# Patient Record
Sex: Male | Born: 1950
Health system: Southern US, Community
[De-identification: ages and names within clinical notes are randomized; demographics above are authoritative.]

## PROBLEM LIST (undated history)

## (undated) DIAGNOSIS — I10 Essential (primary) hypertension: Secondary | ICD-10-CM

## (undated) DIAGNOSIS — I639 Cerebral infarction, unspecified: Secondary | ICD-10-CM

## (undated) DIAGNOSIS — R21 Rash and other nonspecific skin eruption: Secondary | ICD-10-CM

## (undated) DIAGNOSIS — Z8679 Personal history of other diseases of the circulatory system: Secondary | ICD-10-CM

## (undated) DIAGNOSIS — J302 Other seasonal allergic rhinitis: Secondary | ICD-10-CM

## (undated) DIAGNOSIS — Z9109 Other allergy status, other than to drugs and biological substances: Secondary | ICD-10-CM

## (undated) DIAGNOSIS — I429 Cardiomyopathy, unspecified: Secondary | ICD-10-CM

## (undated) DIAGNOSIS — R9431 Abnormal electrocardiogram [ECG] [EKG]: Secondary | ICD-10-CM

## (undated) DIAGNOSIS — S2239XA Fracture of one rib, unspecified side, initial encounter for closed fracture: Secondary | ICD-10-CM

## (undated) DIAGNOSIS — Z91018 Allergy to other foods: Secondary | ICD-10-CM

## (undated) DIAGNOSIS — H9190 Unspecified hearing loss, unspecified ear: Secondary | ICD-10-CM

## (undated) DIAGNOSIS — I77819 Aortic ectasia, unspecified site: Secondary | ICD-10-CM

## (undated) DIAGNOSIS — J329 Chronic sinusitis, unspecified: Secondary | ICD-10-CM

## (undated) DIAGNOSIS — S40212A Abrasion of left shoulder, initial encounter: Secondary | ICD-10-CM

## (undated) DIAGNOSIS — E785 Hyperlipidemia, unspecified: Secondary | ICD-10-CM

## (undated) DIAGNOSIS — S2249XA Multiple fractures of ribs, unspecified side, initial encounter for closed fracture: Secondary | ICD-10-CM

## (undated) HISTORY — DX: Other allergy status, other than to drugs and biological substances: Z91.09

## (undated) HISTORY — DX: Hyperlipidemia, unspecified: E78.5

## (undated) HISTORY — PX: COLONOSCOPY: SHX174

## (undated) HISTORY — DX: Abrasion of left shoulder, initial encounter: S40.212A

## (undated) HISTORY — DX: Personal history of other diseases of the circulatory system: Z86.79

## (undated) HISTORY — DX: Fracture of one rib, unspecified side, initial encounter for closed fracture: S22.39XA

## (undated) HISTORY — DX: Cardiomyopathy, unspecified: I42.9

## (undated) HISTORY — DX: Essential (primary) hypertension: I10

## (undated) HISTORY — DX: Unspecified hearing loss, unspecified ear: H91.90

## (undated) HISTORY — DX: Chronic sinusitis, unspecified: J32.9

## (undated) HISTORY — DX: Cerebral infarction, unspecified: I63.9

## (undated) HISTORY — DX: Aortic ectasia, unspecified site: I77.819

## (undated) HISTORY — DX: Other seasonal allergic rhinitis: J30.2

## (undated) HISTORY — DX: Abnormal electrocardiogram (ECG) (EKG): R94.31

## (undated) HISTORY — DX: Multiple fractures of ribs, unspecified side, initial encounter for closed fracture: S22.49XA

## (undated) HISTORY — PX: OTHER SURGICAL HISTORY: SHX169

## (undated) HISTORY — DX: Rash and other nonspecific skin eruption: R21

## (undated) HISTORY — PX: INGUINAL HERNIA REPAIR: SUR1180

## (undated) HISTORY — DX: Allergy to other foods: Z91.018

---

## 2017-11-16 DIAGNOSIS — I10 Essential (primary) hypertension: Secondary | ICD-10-CM | POA: Diagnosis not present

## 2017-11-16 DIAGNOSIS — Z8673 Personal history of transient ischemic attack (TIA), and cerebral infarction without residual deficits: Secondary | ICD-10-CM | POA: Diagnosis not present

## 2017-11-16 DIAGNOSIS — Z1331 Encounter for screening for depression: Secondary | ICD-10-CM | POA: Diagnosis not present

## 2017-11-16 DIAGNOSIS — R8290 Unspecified abnormal findings in urine: Secondary | ICD-10-CM | POA: Diagnosis not present

## 2017-11-16 DIAGNOSIS — N3000 Acute cystitis without hematuria: Secondary | ICD-10-CM | POA: Diagnosis not present

## 2018-01-31 DIAGNOSIS — H5203 Hypermetropia, bilateral: Secondary | ICD-10-CM | POA: Diagnosis not present

## 2018-02-08 ENCOUNTER — Ambulatory Visit
Admission: RE | Admit: 2018-02-08 | Discharge: 2018-02-08 | Disposition: A | Discharge status: Home / Self Care | Payer: Medicare HMO | Source: Ambulatory Visit | Attending: Internal Medicine | Admitting: Internal Medicine

## 2018-02-08 ENCOUNTER — Other Ambulatory Visit: Payer: Self-pay | Admitting: Internal Medicine

## 2018-02-08 DIAGNOSIS — R21 Rash and other nonspecific skin eruption: Secondary | ICD-10-CM | POA: Diagnosis not present

## 2018-02-08 DIAGNOSIS — S2232XA Fracture of one rib, left side, initial encounter for closed fracture: Secondary | ICD-10-CM | POA: Diagnosis not present

## 2018-02-08 DIAGNOSIS — S2341XA Sprain of ribs, initial encounter: Secondary | ICD-10-CM | POA: Diagnosis not present

## 2018-02-08 DIAGNOSIS — R0781 Pleurodynia: Secondary | ICD-10-CM

## 2018-02-08 DIAGNOSIS — S40212A Abrasion of left shoulder, initial encounter: Secondary | ICD-10-CM | POA: Diagnosis not present

## 2018-02-08 DIAGNOSIS — Z8673 Personal history of transient ischemic attack (TIA), and cerebral infarction without residual deficits: Secondary | ICD-10-CM | POA: Diagnosis not present

## 2018-03-01 DIAGNOSIS — H5310 Unspecified subjective visual disturbances: Secondary | ICD-10-CM | POA: Diagnosis not present

## 2018-03-01 DIAGNOSIS — H5340 Unspecified visual field defects: Secondary | ICD-10-CM | POA: Diagnosis not present

## 2018-03-15 DIAGNOSIS — Z0100 Encounter for examination of eyes and vision without abnormal findings: Secondary | ICD-10-CM | POA: Diagnosis not present

## 2018-05-11 DIAGNOSIS — Z01118 Encounter for examination of ears and hearing with other abnormal findings: Secondary | ICD-10-CM | POA: Diagnosis not present

## 2018-05-11 DIAGNOSIS — Z0001 Encounter for general adult medical examination with abnormal findings: Secondary | ICD-10-CM | POA: Diagnosis not present

## 2018-05-11 DIAGNOSIS — Z0100 Encounter for examination of eyes and vision without abnormal findings: Secondary | ICD-10-CM | POA: Diagnosis not present

## 2018-05-11 DIAGNOSIS — Z1211 Encounter for screening for malignant neoplasm of colon: Secondary | ICD-10-CM | POA: Diagnosis not present

## 2018-05-11 DIAGNOSIS — E785 Hyperlipidemia, unspecified: Secondary | ICD-10-CM | POA: Diagnosis not present

## 2018-05-11 DIAGNOSIS — Z0000 Encounter for general adult medical examination without abnormal findings: Secondary | ICD-10-CM | POA: Diagnosis not present

## 2018-05-11 DIAGNOSIS — J329 Chronic sinusitis, unspecified: Secondary | ICD-10-CM | POA: Diagnosis not present

## 2018-05-11 DIAGNOSIS — H9192 Unspecified hearing loss, left ear: Secondary | ICD-10-CM | POA: Diagnosis not present

## 2018-05-11 DIAGNOSIS — R9431 Abnormal electrocardiogram [ECG] [EKG]: Secondary | ICD-10-CM | POA: Diagnosis not present

## 2018-05-11 DIAGNOSIS — R21 Rash and other nonspecific skin eruption: Secondary | ICD-10-CM

## 2018-05-11 DIAGNOSIS — Z125 Encounter for screening for malignant neoplasm of prostate: Secondary | ICD-10-CM | POA: Diagnosis not present

## 2018-05-24 ENCOUNTER — Telehealth: Payer: Self-pay

## 2018-05-24 NOTE — Telephone Encounter (Signed)
SENT NOTES TO REFERRAL AND FILED NOTES

## 2018-05-29 ENCOUNTER — Ambulatory Visit (INDEPENDENT_AMBULATORY_CARE_PROVIDER_SITE_OTHER): Payer: Medicare HMO | Admitting: Cardiology

## 2018-05-29 DIAGNOSIS — I10 Essential (primary) hypertension: Secondary | ICD-10-CM | POA: Diagnosis not present

## 2018-05-29 DIAGNOSIS — R9431 Abnormal electrocardiogram [ECG] [EKG]: Secondary | ICD-10-CM

## 2018-05-29 HISTORY — DX: Abnormal electrocardiogram (ECG) (EKG): R94.31

## 2018-05-29 HISTORY — DX: Essential (primary) hypertension: I10

## 2018-05-29 NOTE — Progress Notes (Signed)
Cardiology Office Note    Date:  05/29/2018   ID:  Edward [name], DOB Sep 17, 1950, MRN [id number]  PCP:  Shelby Mattocks, PA-C  Cardiologist:  Fransico Him, MD   Chief Complaint  Patient presents with  . New Patient (Initial Visit)    abnormal EKG    History of Present Illness:  Edward [name] is a 67 y.o. male who is being seen today for the evaluation of abnormal EKG at the request of Rodriguez-Southworth, S*.  This is a 67 year old male with a history of varicose veins, hypertension, seasonal allergies who was referred for evaluation of abnormal EKG.  He recently presented for routine physical exam to his PCP and was found to have sinus bradycardia 58 bpm with left anterior fascicular block and nonspecific T wave and normality.  He is now referred for further evaluation.  He tells me that in 2016 he had taken a long plane ride to [facility] and then flew back and afterwards suffered an acute CVA.  He said a work-up at that time with echo showed an enlarged left side of the heart but nothing further was ever done about it.  EKG in review showed T wave inversions in V4 through V6 with flattening of the T wave in V3 and inverted T waves in 3 and aVF.  He denies any chest pain or pressure, SOB, DOE, PND, orthopnea, LE edema, dizziness, palpitations or syncope. He is compliant with his meds and is tolerating meds with no SE. he does have a family history of vascular disease with his mom having multiple CVAs in his father having CHF in the past.    Past Medical History:  Diagnosis Date  . Abnormal EKG   . Abnormal EKG 05/29/2018   Sinus bradycardia and LAFB with TWI V4-V6, III and aVF  . Abrasion of shoulder, left    posterior shoulder  . Benign essential HTN 05/29/2018  . Environmental allergies   . Fracture, ribs   . H/O: varicose veins   . Hearing loss   . Hypertension   . Multiple food allergies   . Rash   . Seasonal allergies   . Sinusitis     No past surgical  history on file.  Current Medications: Current Meds  Medication Sig  . pseudoephedrine (SUDAFED) 30 MG tablet Take 30 mg by mouth every 4 (four) hours as needed for congestion.    Allergies:   Patient has no known allergies.   Social History   Socioeconomic History  . Marital status: Married    Spouse name: Not on file  . Number of children: Not on file  . Years of education: Not on file  . Highest education level: Not on file  Occupational History  . Not on file  Social Needs  . Financial resource strain: Not on file  . Food insecurity:    Worry: Not on file    Inability: Not on file  . Transportation needs:    Medical: Not on file    Non-medical: Not on file  Tobacco Use  . Smoking status: Never Smoker  . Smokeless tobacco: Never Used  Substance and Sexual Activity  . Alcohol use: Not on file  . Drug use: Not on file  . Sexual activity: Not on file  Lifestyle  . Physical activity:    Days per week: Not on file    Minutes per session: Not on file  . Stress: Not on file  Relationships  . Social connections:  Talks on phone: Not on file    Gets together: Not on file    Attends religious service: Not on file    Active member of club or organization: Not on file    Attends meetings of clubs or organizations: Not on file    Relationship status: Not on file  Other Topics Concern  . Not on file  Social History Narrative  . Not on file     Family History:  The patient's family history includes CVA in his mother; Heart failure in his father.   ROS:   Please see the history of present illness.    ROS All other systems reviewed and are negative.  No flowsheet data found.     PHYSICAL EXAM:   VS:  BP (!) 160/98   Pulse 90   Ht 6\' 1"  (1.854 m)   Wt 201 lb 6.4 oz (91.4 kg)   SpO2 92%   BMI 26.57 kg/m    GEN: Well nourished, well developed, in no acute distress  HEENT: normal  Neck: no JVD, carotid bruits, or masses Cardiac: RRR; no murmurs, rubs, or  gallops,no edema.  Intact distal pulses bilaterally.  Respiratory:  clear to auscultation bilaterally, normal work of breathing GI: soft, nontender, nondistended, + BS MS: no deformity or atrophy  Skin: warm and dry, no rash Neuro:  Alert and Oriented x 3, Strength and sensation are intact Psych: euthymic mood, full affect  Wt Readings from Last 3 Encounters:  05/29/18 201 lb 6.4 oz (91.4 kg)      Studies/Labs Reviewed:   EKG:  EKG isnot ordered today.   Recent Labs: No results found for requested labs within last 8760 hours.   Lipid Panel No results found for: CHOL, TRIG, HDL, CHOLHDL, VLDL, LDLCALC, LDLDIRECT  Additional studies/ records that were reviewed today include:  Office notes from PCP    ASSESSMENT:    1. Abnormal EKG   2. Benign essential HTN      PLAN:  In order of problems listed above:  1.  Abnormal EKG -EKG and PCP office showed T wave inversions in V4 through V6 with T wave flattening in V3.  There was T wave inversions in leads III and aVF.  He also had a left anterior fascicular block.  He is completely asymptomatic from a cardiac standpoint and his only risk factors are age greater than 73, male sex and hypertension.  I recommended checking a 2D echocardiogram to assess LV function.  I will also get a nuclear stress test to rule out inducible ischemia.  2.  Hypertension - BP is well controlled on exam today.  He is currently on no antihypertensive medications.    Medication Adjustments/Labs and Tests Ordered: Current medicines are reviewed at length with the patient today.  Concerns regarding medicines are outlined above.  Medication changes, Labs and Tests ordered today are listed in the Patient Instructions below.  There are no Patient Instructions on file for this visit.   Signed, Fransico Him, MD  05/29/2018 8:48 AM    [address], [place], [place]  [address] Phone: [phone]; Fax: [phone]

## 2018-05-29 NOTE — Patient Instructions (Addendum)
Medication Instructions:  .Your physician recommends that you continue on your current medications as directed. Please refer to the Current Medication list given to you today.  Testing/Procedures: Your physician has requested that you have en exercise stress myoview. For further information please visit [email or web address]. Please follow instruction sheet, as given.  Your physician has requested that you have an echocardiogram. Echocardiography is a painless test that uses sound waves to create images of your heart. It provides your doctor with information about the size and shape of your heart and how well your heart's chambers and valves are working. This procedure takes approximately one hour. There are no restrictions for this procedure.  Schedule a Calcium Score  Follow-Up: As needed  If you need a refill on your cardiac medications before your next appointment, please call your pharmacy.

## 2018-06-05 DIAGNOSIS — L570 Actinic keratosis: Secondary | ICD-10-CM | POA: Diagnosis not present

## 2018-06-05 DIAGNOSIS — L2084 Intrinsic (allergic) eczema: Secondary | ICD-10-CM | POA: Diagnosis not present

## 2018-06-06 ENCOUNTER — Other Ambulatory Visit (HOSPITAL_COMMUNITY): Payer: Medicare HMO

## 2018-06-06 ENCOUNTER — Inpatient Hospital Stay: Admission: RE | Admit: 2018-06-06 | Payer: Medicare HMO | Source: Ambulatory Visit

## 2018-06-06 ENCOUNTER — Encounter (HOSPITAL_COMMUNITY): Payer: Medicare HMO

## 2018-06-08 ENCOUNTER — Telehealth (HOSPITAL_COMMUNITY): Payer: Self-pay | Admitting: *Deleted

## 2018-06-08 NOTE — Telephone Encounter (Signed)
Patient given detailed instructions per Myocardial Perfusion Study Information Sheet for the test on 06/13/18. Patient notified to arrive 15 minutes early and that it is imperative to arrive on time for appointment to keep from having the test rescheduled.  If you need to cancel or reschedule your appointment, please call the office within 24 hours of your appointment. . Patient verbalized understanding. Edward [name]     

## 2018-06-13 ENCOUNTER — Other Ambulatory Visit (HOSPITAL_COMMUNITY): Payer: Medicare HMO

## 2018-06-13 ENCOUNTER — Encounter (HOSPITAL_COMMUNITY): Payer: Medicare HMO

## 2018-06-13 ENCOUNTER — Other Ambulatory Visit: Payer: Medicare HMO

## 2018-06-27 ENCOUNTER — Encounter: Payer: Self-pay | Admitting: Cardiology

## 2018-07-05 ENCOUNTER — Telehealth: Payer: Self-pay

## 2018-07-05 DIAGNOSIS — R9431 Abnormal electrocardiogram [ECG] [EKG]: Secondary | ICD-10-CM

## 2018-07-05 NOTE — Telephone Encounter (Signed)
New message    Patient was scheduled on Oct 15th for CT, ECHO, & Nuclear canceled due inactive insurance patient is calling back to R/s appt to Dec 3rd. Will need an new epic order put in.

## 2018-07-18 NOTE — Telephone Encounter (Signed)
Spoke with the patient, he understands his instructions and is willing to schedule the tests.

## 2018-08-08 ENCOUNTER — Encounter (HOSPITAL_COMMUNITY): Payer: Self-pay | Admitting: *Deleted

## 2018-08-08 ENCOUNTER — Telehealth (HOSPITAL_COMMUNITY): Payer: Self-pay | Admitting: *Deleted

## 2018-08-08 NOTE — Telephone Encounter (Signed)
Patient given detailed instructions per Myocardial Perfusion Study Information Sheet for the test on 08/14/18 at 1045. Patient notified to arrive 15 minutes early and that it is imperative to arrive on time for appointment to keep from having the test rescheduled.  If you need to cancel or reschedule your appointment, please call the office within 24 hours of your appointment. . Patient verbalized understanding.Keefer Soulliere, Ranae Palms

## 2018-08-14 ENCOUNTER — Ambulatory Visit (HOSPITAL_COMMUNITY): Payer: Medicare HMO | Attending: Cardiology

## 2018-08-14 ENCOUNTER — Other Ambulatory Visit: Payer: Self-pay

## 2018-08-14 ENCOUNTER — Ambulatory Visit (HOSPITAL_BASED_OUTPATIENT_CLINIC_OR_DEPARTMENT_OTHER): Payer: Medicare HMO

## 2018-08-14 ENCOUNTER — Ambulatory Visit: Admission: RE | Admit: 2018-08-14 | Payer: Self-pay | Source: Ambulatory Visit

## 2018-08-14 DIAGNOSIS — R9431 Abnormal electrocardiogram [ECG] [EKG]: Secondary | ICD-10-CM | POA: Insufficient documentation

## 2018-08-14 LAB — MYOCARDIAL PERFUSION IMAGING
CHL CUP NUCLEAR SSS: 0
CHL CUP REST BP STRESS: 157 mmHg
CHL CUP RESTING HR STRESS: 62 {beats}/min
CSEPPHR: 91 {beats}/min
LV sys vol: 88 mL
LVDIAVOL: 150 mL (ref 62–150)
SDS: 0
SRS: 0
TID: 1.04

## 2018-08-14 MED ORDER — TECHNETIUM TC 99M TETROFOSMIN IV KIT
9.4000 | PACK | Freq: Once | INTRAVENOUS | Status: AC | PRN
  Administered 2018-08-14: 9.4 via INTRAVENOUS
  Filled 2018-08-14: qty 10

## 2018-08-14 MED ORDER — TECHNETIUM TC 99M TETROFOSMIN IV KIT
32.6000 | PACK | Freq: Once | INTRAVENOUS | Status: AC | PRN
  Administered 2018-08-14: 32.6 via INTRAVENOUS
  Filled 2018-08-14: qty 33

## 2018-08-14 MED ORDER — REGADENOSON 0.4 MG/5ML IV SOLN
0.4000 mg | Freq: Once | INTRAVENOUS | Status: AC
  Administered 2018-08-14: 0.4 mg via INTRAVENOUS

## 2018-08-17 ENCOUNTER — Telehealth: Payer: Self-pay

## 2018-08-17 DIAGNOSIS — I77810 Thoracic aortic ectasia: Secondary | ICD-10-CM

## 2018-08-17 NOTE — Telephone Encounter (Signed)
Spoke with the patient he expressed understanding about the results and accepted having a repeat echo in 1 year. He had no further questions.

## 2018-08-17 NOTE — Telephone Encounter (Signed)
-----   Message from Sueanne Margarita, MD sent at 08/14/2018  7:51 PM EST ----- Echo showed mildly thickened heart muscle with low normal LVF and increased stiffness of heart muscle.  Mildly dilated ascending aorta.  Please repeat echo in 1 year for dilated aorta

## 2018-09-21 ENCOUNTER — Ambulatory Visit (INDEPENDENT_AMBULATORY_CARE_PROVIDER_SITE_OTHER): Payer: Medicare HMO | Admitting: Internal Medicine

## 2018-09-21 ENCOUNTER — Encounter: Payer: Self-pay | Admitting: Internal Medicine

## 2018-09-21 ENCOUNTER — Other Ambulatory Visit: Payer: Self-pay

## 2018-09-21 VITALS — BP 130/88 | HR 75 | Temp 98.3°F | Resp 16 | Ht 72.0 in | Wt 200.0 lb

## 2018-09-21 DIAGNOSIS — R05 Cough: Secondary | ICD-10-CM

## 2018-09-21 DIAGNOSIS — R062 Wheezing: Secondary | ICD-10-CM

## 2018-09-21 DIAGNOSIS — R059 Cough, unspecified: Secondary | ICD-10-CM

## 2018-09-21 DIAGNOSIS — R6883 Chills (without fever): Secondary | ICD-10-CM | POA: Diagnosis not present

## 2018-09-21 DIAGNOSIS — J40 Bronchitis, not specified as acute or chronic: Secondary | ICD-10-CM | POA: Diagnosis not present

## 2018-09-21 LAB — POCT INFLUENZA A/B
INFLUENZA A, POC: NEGATIVE
Influenza B, POC: NEGATIVE

## 2018-09-21 MED ORDER — DOXYCYCLINE HYCLATE 100 MG PO TABS: 100.0000 mg | ORAL_TABLET | Freq: Two times a day (BID) | ORAL | 0 refills | Status: DC

## 2018-09-21 MED ORDER — IPRATROPIUM-ALBUTEROL 0.5-2.5 (3) MG/3ML IN SOLN
3.0000 mL | Freq: Once | RESPIRATORY_TRACT | Status: AC
  Administered 2018-09-21: 3 mL via RESPIRATORY_TRACT

## 2018-09-21 MED ORDER — ALBUTEROL SULFATE HFA 108 (90 BASE) MCG/ACT IN AERS: 2.0000 | INHALATION_SPRAY | Freq: Four times a day (QID) | RESPIRATORY_TRACT | 0 refills | Status: DC | PRN

## 2018-09-21 NOTE — Patient Instructions (Addendum)
     If you have lab work done today you will be contacted with your lab results within the next 2 weeks.  If you have not heard from us then please contact us. The fastest way to get your results is to register for My Chart.   IF you received an x-ray today, you will receive an invoice from [facility]. Please contact [facility] at [phone] with questions or concerns regarding your invoice.   IF you received labwork today, you will receive an invoice from [facility]. Please contact [facility] at [phone] with questions or concerns regarding your invoice.   Our billing staff will not be able to assist you with questions regarding bills from these companies.  You will be contacted with the lab results as soon as they are available. The fastest way to get your results is to activate your My Chart account. Instructions are located on the last page of this paperwork. If you have not heard from us regarding the results in 2 weeks, please contact this office.     Acute Bronchitis, Adult Acute bronchitis is when air tubes (bronchi) in the lungs suddenly get swollen. The condition can make it hard to breathe. It can also cause these symptoms:  A cough.  Coughing up clear, yellow, or green mucus.  Wheezing.  Chest congestion.  Shortness of breath.  A fever.  Body aches.  Chills.  A sore throat. Follow these instructions at home:  Medicines  Take over-the-counter and prescription medicines only as told by your doctor.  If you were prescribed an antibiotic medicine, take it as told by your doctor. Do not stop taking the antibiotic even if you start to feel better. General instructions  Rest.  Drink enough fluids to keep your pee (urine) pale yellow.  Avoid smoking and secondhand smoke. If you smoke and you need help quitting, ask your doctor. Quitting will help your lungs heal faster.  Use an inhaler, cool mist vaporizer, or humidifier as told by your  doctor.  Keep all follow-up visits as told by your doctor. This is important. How is this prevented? To lower your risk of getting this condition again:  Wash your hands often with soap and water. If you cannot use soap and water, use hand sanitizer.  Avoid contact with people who have cold symptoms.  Try not to touch your hands to your mouth, nose, or eyes.  Make sure to get the flu shot every year. Contact a doctor if:  Your symptoms do not get better in 2 weeks. Get help right away if:  You cough up blood.  You have chest pain.  You have very bad shortness of breath.  You become dehydrated.  You faint (pass out) or keep feeling like you are going to pass out.  You keep throwing up (vomiting).  You have a very bad headache.  Your fever or chills gets worse. This information is not intended to replace advice given to you by your health care provider. Make sure you discuss any questions you have with your health care provider. Document Released: 02/02/2008 Document Revised: 03/30/2017 Document Reviewed: 02/04/2016 Elsevier Interactive Patient Education  2019 [employer] Inc[employer]  

## 2018-09-21 NOTE — Progress Notes (Addendum)
Subjective:     Patient ID: Edward [name] , male    DOB: June 05, 1951 , 68 y.o.   MRN: [id number]   Chief Complaint  Patient presents with  . Cough    chronic   . Nasal Congestion    with some drainage and chills x 3 days     HPI Pt is here due to developing  a  Wheezy cough 11/2 weeks ago and at times felt little feverish. Then 2 days ago, got nose stuffiness. Rhinitis is clear to green mucous with the L side bleeding. Had a few days of feeling aching on his L upper teeth. Chills has been having off and on since the cough. He been taking vix Nyquil type med. One tablet a day. Has not done saline nose rinses. Denies HA or body aches. Denies past history of sinus surgery, but is prone to get sinus infections. Has had wheezing in the past related to allergic and reactive airway disease.   Past Medical History:  Diagnosis Date  . Abnormal EKG 05/29/2018   Sinus bradycardia and LAFB with TWI V4-V6, III and aVF  . Abrasion of shoulder, left    posterior shoulder  . Benign essential HTN 05/29/2018  . Environmental allergies   . Fracture, ribs   . H/O: varicose veins   . Hearing loss   . Hypertension   . Multiple food allergies   . Rash   . Seasonal allergies   . Sinusitis      Family History  Problem Relation Age of Onset  . CVA Mother   . Heart failure Father      Current Outpatient Medications:  .  pseudoephedrine (SUDAFED) 30 MG tablet, Take 30 mg by mouth every 4 (four) hours as needed for congestion., Disp: , Rfl:    No Known Allergies   Review of Systems  Constitutional: Positive for chills. Negative for diaphoresis and fever.  HENT: Positive for congestion and postnasal drip. Negative for facial swelling, sinus pressure, sinus pain and sore throat.   Respiratory: Positive for cough and wheezing. Negative for shortness of breath.   Musculoskeletal: Negative for myalgias.  Neurological: Negative for headaches.     Today's Vitals   09/21/18 1124  BP: 140/86  Pulse: 75   Resp: 16  Temp: 98.3 F (36.8 C)  TempSrc: Oral  SpO2: 96%  Weight: 200 lb (90.7 kg)  Height: 6' (1.829 m)   Body mass index is 27.12 kg/m.  Repeated BP- 130/88, post neb pulse ox 98% Objective:  Physical Exam Vitals signs and nursing note reviewed.  Constitutional:      General: He is not in acute distress.    Appearance: Normal appearance. He is normal weight. He is not toxic-appearing.  HENT:     Head: Normocephalic.     Right Ear: Tympanic membrane and ear canal normal.     Left Ear: Tympanic membrane and ear canal normal.     Nose: Congestion present. No rhinorrhea.     Comments: Has mild blood on the R septum, has moderate congestion on L side. No discolored mucous noted.     Mouth/Throat:     Mouth: Mucous membranes are moist.     Pharynx: Oropharynx is clear.  Eyes:     General: No scleral icterus.    Conjunctiva/sclera: Conjunctivae normal.  Neck:     Musculoskeletal: Neck supple. No neck rigidity.  Cardiovascular:     Rate and Rhythm: Regular rhythm.     Heart sounds: No  murmur.  Pulmonary:     Effort: Pulmonary effort is normal.     Breath sounds: Wheezing present. No rhonchi or rales.  Lymphadenopathy:     Cervical: No cervical adenopathy.  Skin:    General: Skin is warm and dry.  Neurological:     Mental Status: He is alert and oriented to person, place, and time.  Psychiatric:        Mood and Affect: Mood normal.        Behavior: Behavior normal.        Thought Content: Thought content normal.        Judgment: Judgment normal.    Flu A&B- negative Assessment And Plan:   Bronchitis- acute. He was given Neb treatment which helped with cough attacks.  Was placed on Doxycycline and Proventil inhaler as noted.  If he gets worse, was advised to be seen again.    Early Steel RODRIGUEZ-SOUTHWORTH, PA-C

## 2018-09-25 ENCOUNTER — Telehealth: Payer: Self-pay

## 2018-09-25 ENCOUNTER — Other Ambulatory Visit: Payer: Self-pay | Admitting: Nurse Practitioner

## 2018-09-25 ENCOUNTER — Other Ambulatory Visit: Payer: Self-pay

## 2018-09-25 DIAGNOSIS — Z20828 Contact with and (suspected) exposure to other viral communicable diseases: Secondary | ICD-10-CM

## 2018-09-25 MED ORDER — OSELTAMIVIR PHOSPHATE 75 MG PO CAPS: 75.0000 mg | ORAL_CAPSULE | Freq: Every day | ORAL | 0 refills | Status: AC

## 2018-09-25 MED ORDER — BALOXAVIR MARBOXIL(40 MG DOSE) 2 X 20 MG PO TBPK: 2.0000 | ORAL_TABLET | Freq: Once | ORAL | 0 refills | Status: AC

## 2018-10-16 ENCOUNTER — Other Ambulatory Visit: Payer: Self-pay | Admitting: Internal Medicine

## 2018-10-16 DIAGNOSIS — J40 Bronchitis, not specified as acute or chronic: Secondary | ICD-10-CM

## 2019-03-23 DIAGNOSIS — H2513 Age-related nuclear cataract, bilateral: Secondary | ICD-10-CM | POA: Diagnosis not present

## 2019-03-23 DIAGNOSIS — H5203 Hypermetropia, bilateral: Secondary | ICD-10-CM | POA: Diagnosis not present

## 2019-03-23 DIAGNOSIS — H52223 Regular astigmatism, bilateral: Secondary | ICD-10-CM | POA: Diagnosis not present

## 2019-03-23 DIAGNOSIS — H524 Presbyopia: Secondary | ICD-10-CM | POA: Diagnosis not present

## 2019-04-23 DIAGNOSIS — L304 Erythema intertrigo: Secondary | ICD-10-CM | POA: Diagnosis not present

## 2019-04-23 DIAGNOSIS — D1801 Hemangioma of skin and subcutaneous tissue: Secondary | ICD-10-CM | POA: Diagnosis not present

## 2019-04-23 DIAGNOSIS — D225 Melanocytic nevi of trunk: Secondary | ICD-10-CM | POA: Diagnosis not present

## 2019-04-23 DIAGNOSIS — L821 Other seborrheic keratosis: Secondary | ICD-10-CM | POA: Diagnosis not present

## 2019-04-23 DIAGNOSIS — L578 Other skin changes due to chronic exposure to nonionizing radiation: Secondary | ICD-10-CM | POA: Diagnosis not present

## 2019-04-23 DIAGNOSIS — L570 Actinic keratosis: Secondary | ICD-10-CM | POA: Diagnosis not present

## 2019-04-23 DIAGNOSIS — L814 Other melanin hyperpigmentation: Secondary | ICD-10-CM | POA: Diagnosis not present

## 2019-05-24 ENCOUNTER — Ambulatory Visit: Payer: Medicare HMO

## 2019-05-24 ENCOUNTER — Ambulatory Visit (INDEPENDENT_AMBULATORY_CARE_PROVIDER_SITE_OTHER): Payer: Medicare HMO

## 2019-05-24 ENCOUNTER — Encounter: Payer: Self-pay | Admitting: Internal Medicine

## 2019-05-24 ENCOUNTER — Other Ambulatory Visit: Payer: Self-pay

## 2019-05-24 ENCOUNTER — Ambulatory Visit (INDEPENDENT_AMBULATORY_CARE_PROVIDER_SITE_OTHER): Payer: Medicare HMO | Admitting: Internal Medicine

## 2019-05-24 VITALS — BP 142/82 | HR 84 | Temp 98.6°F | Ht 73.0 in | Wt 196.4 lb

## 2019-05-24 VITALS — BP 142/82 | HR 84 | Temp 98.6°F | Ht 73.0 in | Wt 196.0 lb

## 2019-05-24 DIAGNOSIS — R69 Illness, unspecified: Secondary | ICD-10-CM | POA: Diagnosis not present

## 2019-05-24 DIAGNOSIS — I10 Essential (primary) hypertension: Secondary | ICD-10-CM

## 2019-05-24 DIAGNOSIS — G250 Essential tremor: Secondary | ICD-10-CM

## 2019-05-24 DIAGNOSIS — Z1159 Encounter for screening for other viral diseases: Secondary | ICD-10-CM

## 2019-05-24 DIAGNOSIS — Z23 Encounter for immunization: Secondary | ICD-10-CM

## 2019-05-24 DIAGNOSIS — R82998 Other abnormal findings in urine: Secondary | ICD-10-CM

## 2019-05-24 DIAGNOSIS — R9431 Abnormal electrocardiogram [ECG] [EKG]: Secondary | ICD-10-CM | POA: Diagnosis not present

## 2019-05-24 DIAGNOSIS — Z0100 Encounter for examination of eyes and vision without abnormal findings: Secondary | ICD-10-CM | POA: Diagnosis not present

## 2019-05-24 DIAGNOSIS — Z0000 Encounter for general adult medical examination without abnormal findings: Secondary | ICD-10-CM

## 2019-05-24 DIAGNOSIS — I7121 Aneurysm of the ascending aorta, without rupture: Secondary | ICD-10-CM | POA: Insufficient documentation

## 2019-05-24 DIAGNOSIS — Z0001 Encounter for general adult medical examination with abnormal findings: Secondary | ICD-10-CM

## 2019-05-24 DIAGNOSIS — R3989 Other symptoms and signs involving the genitourinary system: Secondary | ICD-10-CM | POA: Diagnosis not present

## 2019-05-24 DIAGNOSIS — Z820 Family history of epilepsy and other diseases of the nervous system: Secondary | ICD-10-CM

## 2019-05-24 DIAGNOSIS — E7800 Pure hypercholesterolemia, unspecified: Secondary | ICD-10-CM

## 2019-05-24 DIAGNOSIS — Z1211 Encounter for screening for malignant neoplasm of colon: Secondary | ICD-10-CM | POA: Diagnosis not present

## 2019-05-24 DIAGNOSIS — R030 Elevated blood-pressure reading, without diagnosis of hypertension: Secondary | ICD-10-CM | POA: Diagnosis not present

## 2019-05-24 DIAGNOSIS — Z87898 Personal history of other specified conditions: Secondary | ICD-10-CM | POA: Diagnosis not present

## 2019-05-24 DIAGNOSIS — I77810 Thoracic aortic ectasia: Secondary | ICD-10-CM | POA: Diagnosis not present

## 2019-05-24 LAB — POCT URINALYSIS DIPSTICK
Bilirubin, UA: NEGATIVE
Blood, UA: NEGATIVE
Glucose, UA: NEGATIVE
Ketones, UA: NEGATIVE
Nitrite, UA: NEGATIVE
Protein, UA: NEGATIVE
Spec Grav, UA: 1.01 (ref 1.010–1.025)
Urobilinogen, UA: 0.2 E.U./dL
pH, UA: 6 (ref 5.0–8.0)

## 2019-05-24 MED ORDER — PREVNAR 13 IM SUSP: 0.5000 mL | INTRAMUSCULAR | 0 refills | Status: AC

## 2019-05-24 NOTE — Patient Instructions (Signed)
[name] , Thank you for taking time to come for your Medicare Wellness Visit. I appreciate your ongoing commitment to your health goals. Please review the following plan we discussed and let me know if I can assist you in the future.   Screening recommendations/referrals: Colonoscopy: up to date per patient Recommended yearly ophthalmology/optometry visit for glaucoma screening and checkup Recommended yearly dental visit for hygiene and checkup  Vaccinations: Influenza vaccine: declines at this time Pneumococcal vaccine: sent to pharmacy Tdap vaccine: states believes had Shingles vaccine: discussed    Advanced directives: Advance directive discussed with you today. I have provided a copy for you to complete at home and have notarized. Once this is complete please bring a copy in to our office so we can scan it into your chart.   Conditions/risks identified: overweight  Next appointment: 05/29/2020 at 10:00  Preventive Care 24 Years and Older, Male Preventive care refers to lifestyle choices and visits with your health care provider that can promote health and wellness. What does preventive care include?  A yearly physical exam. This is also called an annual well check.  Dental exams once or twice a year.  Routine eye exams. Ask your health care provider how often you should have your eyes checked.  Personal lifestyle choices, including:  Daily care of your teeth and gums.  Regular physical activity.  Eating a healthy diet.  Avoiding tobacco and drug use.  Limiting alcohol use.  Practicing safe sex.  Taking low doses of aspirin every day.  Taking vitamin and mineral supplements as recommended by your health care provider. What happens during an annual well check? The services and screenings done by your health care provider during your annual well check will depend on your age, overall health, lifestyle risk factors, and family history of disease. Counseling  Your  health care provider may ask you questions about your:  Alcohol use.  Tobacco use.  Drug use.  Emotional well-being.  Home and relationship well-being.  Sexual activity.  Eating habits.  History of falls.  Memory and ability to understand (cognition).  Work and work Statistician. Screening  You may have the following tests or measurements:  Height, weight, and BMI.  Blood pressure.  Lipid and cholesterol levels. These may be checked every 5 years, or more frequently if you are over 45 years old.  Skin check.  Lung cancer screening. You may have this screening every year starting at age 26 if you have a 30-pack-year history of smoking and currently smoke or have quit within the past 15 years.  Fecal occult blood test (FOBT) of the stool. You may have this test every year starting at age 65.  Flexible sigmoidoscopy or colonoscopy. You may have a sigmoidoscopy every 5 years or a colonoscopy every 10 years starting at age 64.  Prostate cancer screening. Recommendations will vary depending on your family history and other risks.  Hepatitis C blood test.  Hepatitis B blood test.  Sexually transmitted disease (STD) testing.  Diabetes screening. This is done by checking your blood sugar (glucose) after you have not eaten for a while (fasting). You may have this done every 1-3 years.  Abdominal aortic aneurysm (AAA) screening. You may need this if you are a current or former smoker.  Osteoporosis. You may be screened starting at age 38 if you are at high risk. Talk with your health care provider about your test results, treatment options, and if necessary, the need for more tests. Vaccines  Your  health care provider may recommend certain vaccines, such as:  Influenza vaccine. This is recommended every year.  Tetanus, diphtheria, and acellular pertussis (Tdap, Td) vaccine. You may need a Td booster every 10 years.  Zoster vaccine. You may need this after age 45.   Pneumococcal 13-valent conjugate (PCV13) vaccine. One dose is recommended after age 42.  Pneumococcal polysaccharide (PPSV23) vaccine. One dose is recommended after age 89. Talk to your health care provider about which screenings and vaccines you need and how often you need them. This information is not intended to replace advice given to you by your health care provider. Make sure you discuss any questions you have with your health care provider. Document Released: 09/12/2015 Document Revised: 05/05/2016 Document Reviewed: 06/17/2015 Elsevier Interactive Patient Education  2017 [place] Prevention in the Home Falls can cause injuries. They can happen to people of all ages. There are many things you can do to make your home safe and to help prevent falls. What can I do on the outside of my home?  Regularly fix the edges of walkways and driveways and fix any cracks.  Remove anything that might make you trip as you walk through a door, such as a raised step or threshold.  Trim any bushes or trees on the path to your home.  Use bright outdoor lighting.  Clear any walking paths of anything that might make someone trip, such as rocks or tools.  Regularly check to see if handrails are loose or broken. Make sure that both sides of any steps have handrails.  Any raised decks and porches should have guardrails on the edges.  Have any leaves, snow, or ice cleared regularly.  Use sand or salt on walking paths during winter.  Clean up any spills in your garage right away. This includes oil or grease spills. What can I do in the bathroom?  Use night lights.  Install grab bars by the toilet and in the tub and shower. Do not use towel bars as grab bars.  Use non-skid mats or decals in the tub or shower.  If you need to sit down in the shower, use a plastic, non-slip stool.  Keep the floor dry. Clean up any water that spills on the floor as soon as it happens.  Remove soap  buildup in the tub or shower regularly.  Attach bath mats securely with double-sided non-slip rug tape.  Do not have throw rugs and other things on the floor that can make you trip. What can I do in the bedroom?  Use night lights.  Make sure that you have a light by your bed that is easy to reach.  Do not use any sheets or blankets that are too big for your bed. They should not hang down onto the floor.  Have a firm chair that has side arms. You can use this for support while you get dressed.  Do not have throw rugs and other things on the floor that can make you trip. What can I do in the kitchen?  Clean up any spills right away.  Avoid walking on wet floors.  Keep items that you use a lot in easy-to-reach places.  If you need to reach something above you, use a strong step stool that has a grab bar.  Keep electrical cords out of the way.  Do not use floor polish or wax that makes floors slippery. If you must use wax, use non-skid floor wax.  Do not  have throw rugs and other things on the floor that can make you trip. What can I do with my stairs?  Do not leave any items on the stairs.  Make sure that there are handrails on both sides of the stairs and use them. Fix handrails that are broken or loose. Make sure that handrails are as long as the stairways.  Check any carpeting to make sure that it is firmly attached to the stairs. Fix any carpet that is loose or worn.  Avoid having throw rugs at the top or bottom of the stairs. If you do have throw rugs, attach them to the floor with carpet tape.  Make sure that you have a light switch at the top of the stairs and the bottom of the stairs. If you do not have them, ask someone to add them for you. What else can I do to help prevent falls?  Wear shoes that:  Do not have high heels.  Have rubber bottoms.  Are comfortable and fit you well.  Are closed at the toe. Do not wear sandals.  If you use a stepladder:  Make  sure that it is fully opened. Do not climb a closed stepladder.  Make sure that both sides of the stepladder are locked into place.  Ask someone to hold it for you, if possible.  Clearly mark and make sure that you can see:  Any grab bars or handrails.  First and last steps.  Where the edge of each step is.  Use tools that help you move around (mobility aids) if they are needed. These include:  Canes.  Walkers.  Scooters.  Crutches.  Turn on the lights when you go into a dark area. Replace any light bulbs as soon as they burn out.  Set up your furniture so you have a clear path. Avoid moving your furniture around.  If any of your floors are uneven, fix them.  If there are any pets around you, be aware of where they are.  Review your medicines with your doctor. Some medicines can make you feel dizzy. This can increase your chance of falling. Ask your doctor what other things that you can do to help prevent falls. This information is not intended to replace advice given to you by your health care provider. Make sure you discuss any questions you have with your health care provider. Document Released: 06/12/2009 Document Revised: 01/22/2016 Document Reviewed: 09/20/2014 Elsevier Interactive Patient Education  2017 [employer].

## 2019-05-24 NOTE — Addendum Note (Signed)
Addended by: Nicki Guadalajara on: 05/24/2019 02:05 PM   Modules accepted: Orders

## 2019-05-24 NOTE — Progress Notes (Signed)
Subjective:   Edward [name] is a 68 y.o. male who presents for an Initial Medicare Annual Wellness Visit.  Review of Systems  n/a Cardiac Risk Factors include: advanced age (>79men, >42 women);hypertension    Objective:    Today's Vitals   05/24/19 1153  BP: (!) 142/82  Pulse: 84  Temp: 98.6 F (37 C)  TempSrc: Oral  Weight: 196 lb (88.9 kg)  Height: 6\' 1"  (1.854 m)   Body mass index is 25.86 kg/m.  Advanced Directives 05/24/2019  Does Patient Have a Medical Advance Directive? No  Would patient like information on creating a medical advance directive? Yes (MAU/Ambulatory/Procedural Areas - Information given)    Current Medications (verified) No outpatient encounter medications on file as of 05/24/2019.   No facility-administered encounter medications on file as of 05/24/2019.     Allergies (verified) Patient has no known allergies.   History: Past Medical History:  Diagnosis Date  . Abnormal EKG 05/29/2018   Sinus bradycardia and LAFB with TWI V4-V6, III and aVF  . Abrasion of shoulder, left    posterior shoulder  . Benign essential HTN 05/29/2018  . Environmental allergies   . Fracture, ribs   . H/O: varicose veins   . Hearing loss   . Hypertension   . Multiple food allergies   . Rash   . Seasonal allergies   . Sinusitis    History reviewed. No pertinent surgical history. Family History  Problem Relation Age of Onset  . CVA Mother   . Heart failure Father    Social History   Socioeconomic History  . Marital status: Married    Spouse name: Not on file  . Number of children: Not on file  . Years of education: Not on file  . Highest education level: Not on file  Occupational History  . Not on file  Social Needs  . Financial resource strain: Not hard at all  . Food insecurity    Worry: Never true    Inability: Never true  . Transportation needs    Medical: No    Non-medical: No  Tobacco Use  . Smoking status: Never Smoker  . Smokeless tobacco:  Never Used  Substance and Sexual Activity  . Alcohol use: Yes    Comment: occassion  . Drug use: Never  . Sexual activity: Yes  Lifestyle  . Physical activity    Days per week: 0 days    Minutes per session: 0 min  . Stress: Not at all  Relationships  . Social Herbalist on phone: Not on file    Gets together: Not on file    Attends religious service: Not on file    Active member of club or organization: Not on file    Attends meetings of clubs or organizations: Not on file    Relationship status: Not on file  Other Topics Concern  . Not on file  Social History Narrative  . Not on file   Tobacco Counseling Counseling given: Not Answered   Clinical Intake:  Pre-visit preparation completed: Yes  Pain : No/denies pain     Nutritional Status: BMI 25 -29 Overweight Nutritional Risks: None Diabetes: No  How often do you need to have someone help you when you read instructions, pamphlets, or other written materials from your doctor or pharmacy?: 1 - Never What is the last grade level you completed in school?: college  Interpreter Needed?: No  Information entered by :: NAllen LPN  Activities  of Daily Living In your present state of health, do you have any difficulty performing the following activities: 05/24/2019 05/24/2019  Hearing? Y N  Comment has tinnitus -  Vision? N N  Difficulty concentrating or making decisions? N N  Walking or climbing stairs? N N  Dressing or bathing? N N  Doing errands, shopping? N N  Preparing Food and eating ? N -  Using the Toilet? N -  In the past six months, have you accidently leaked urine? N -  Do you have problems with loss of bowel control? N -  Managing your Medications? N -  Managing your Finances? N -  Housekeeping or managing your Housekeeping? N -  Some recent data might be hidden     Immunizations and Health Maintenance  There is no immunization history on file for this patient. Health Maintenance Due   Topic Date Due  . Hepatitis C Screening  18-Apr-1951  . TETANUS/TDAP  11/10/1971  . COLONOSCOPY  11/10/2002  . PNA vac Low Risk Adult (1 of 2 - PCV13) 11/09/2017  . INFLUENZA VACCINE  03/31/2019    Patient Care Team: [place], Sunday Spillers, PA-C as PCP - General (Physician Assistant)  Indicate any recent Medical Services you may have received from other than [facility] providers in the past year (date may be approximate).    Assessment:   This is a routine wellness examination for [employer].  Hearing/Vision screen  Hearing Screening   125Hz  250Hz  500Hz  1000Hz  2000Hz  3000Hz  4000Hz  6000Hz  8000Hz   Right ear:           Left ear:           Vision Screening Comments: Annual eye exams  Dietary issues and exercise activities discussed: Current Exercise Habits: The patient has a physically strenuous job, but has no regular exercise apart from work.  Goals    . Patient Stated     05/24/2019, wants to get back to the gym      Depression Screen PHQ 2/9 Scores 05/24/2019 09/21/2018  PHQ - 2 Score 0 0  PHQ- 9 Score 0 -    Fall Risk Fall Risk  05/24/2019 05/24/2019 09/21/2018  Falls in the past year? 0 0 0  Number falls in past yr: 0 - -  Injury with Fall? - - 0  Follow up Falls evaluation completed;Education provided;Falls prevention discussed - -    Is the patient's home free of loose throw rugs in walkways, pet beds, electrical cords, etc?   yes      Grab bars in the bathroom? no      Handrails on the stairs?   yes      Adequate lighting?   yes  Timed Get Up and Go performed: n/a  Cognitive Function:     6CIT Screen 05/24/2019  What Year? 0 points  What month? 0 points  What time? 0 points  Count back from 20 0 points  Months in reverse 0 points  Repeat phrase 0 points  Total Score 0    Screening Tests Health Maintenance  Topic Date Due  . Hepatitis C Screening  24-May-1951  . TETANUS/TDAP  11/10/1971  . COLONOSCOPY  11/10/2002  . PNA vac Low Risk Adult (1 of 2 - PCV13)  11/09/2017  . INFLUENZA VACCINE  03/31/2019    Qualifies for Shingles Vaccine? yes  Cancer Screenings: Lung: Low Dose CT Chest recommended if Age 74-80 years, 30 pack-year currently smoking OR have quit w/in 15years. Patient does not qualify. Colorectal: up  to date per patient  Additional Screenings:  Hepatitis C Screening: today      Plan:    Patient wants to get back to the gym.  I have personally reviewed and noted the following in the patient's chart:   . Medical and social history . Use of alcohol, tobacco or illicit drugs  . Current medications and supplements . Functional ability and status . Nutritional status . Physical activity . Advanced directives . List of other physicians . Hospitalizations, surgeries, and ER visits in previous 12 months . Vitals . Screenings to include cognitive, depression, and falls . Referrals and appointments  In addition, I have reviewed and discussed with patient certain preventive protocols, quality metrics, and best practice recommendations. A written personalized care plan for preventive services as well as general preventive health recommendations were provided to patient.     Kellie Simmering, LPN   [id number]

## 2019-05-24 NOTE — Patient Instructions (Signed)
Tremor A tremor is trembling or shaking that you cannot control. Most tremors affect the hands or arms. Tremors can also affect the head, vocal cords, face, and other parts of the body. There are many types of tremors. Common types include:  Essential tremor. These usually occur in people older than 40. It may run in families and can happen in otherwise healthy people.  Resting tremor. These occur when the muscles are at rest, such as when your hands are resting in your lap. People with Parkinson's disease often have resting tremors.  Postural tremor. These occur when you try to hold a pose, such as keeping your hands outstretched.  Kinetic tremor. These occur during purposeful movement, such as trying to touch a finger to your nose.  Task-specific tremor. These may occur when you perform certain tasks such as writing, speaking, or standing.  Psychogenic tremor. These dramatically lessen or disappear when you are distracted. They can happen in people of all ages. Some types of tremors have no known cause. Tremors can also be a symptom of nervous system problems (neurological disorders) that may occur with aging. Some tremors go away with treatment, while others do not. Follow these instructions at home: Lifestyle      Limit alcohol intake to no more than 1 drink a day for nonpregnant women and 2 drinks a day for men. One drink equals 12 oz of beer, 5 oz of wine, or 1 oz of hard liquor.  Do not use any products that contain nicotine or tobacco, such as cigarettes and e-cigarettes. If you need help quitting, ask your health care provider.  Avoid extreme heat and extreme cold.  Limit your caffeine intake, as told by your health care provider.  Try to get 8 hours of sleep each night.  Find ways to manage your stress, such as meditation or yoga. General instructions  Take over-the-counter and prescription medicines only as told by your health care provider.  Keep all follow-up visits  as told by your health care provider. This is important. Contact a health care provider if you:  Develop a tremor after starting a new medicine.  Have a tremor along with other symptoms such as: ? Numbness. ? Tingling. ? Pain. ? Weakness.  Notice that your tremor gets worse.  Notice that your tremor interferes with your day-to-day life. Summary  A tremor is trembling or shaking that you cannot control.  Most tremors affect the hands or arms.  Some types of tremors have no known cause. Others may be a symptom of nervous system problems (neurological disorders).  Make sure you discuss any tremors you have with your health care provider. This information is not intended to replace advice given to you by your health care provider. Make sure you discuss any questions you have with your health care provider. Document Released: 08/06/2002 Document Revised: 07/29/2017 Document Reviewed: 06/16/2017 Elsevier Patient Education  2020 [place] 66 Years and Older, Male Preventive care refers to lifestyle choices and visits with your health care provider that can promote health and wellness. This includes:  A yearly physical exam. This is also called an annual well check.  Regular dental and eye exams.  Immunizations.  Screening for certain conditions.  Healthy lifestyle choices, such as diet and exercise. What can I expect for my preventive care visit? Physical exam Your health care provider will check:  Height and weight. These may be used to calculate body mass index (BMI), which is a measurement that tells  if you are at a healthy weight.  Heart rate and blood pressure.  Your skin for abnormal spots. Counseling Your health care provider may ask you questions about:  Alcohol, tobacco, and drug use.  Emotional well-being.  Home and relationship well-being.  Sexual activity.  Eating habits.  History of falls.  Memory and ability to understand  (cognition).  Work and work Statistician. What immunizations do I need?  Influenza (flu) vaccine  This is recommended every year. Tetanus, diphtheria, and pertussis (Tdap) vaccine  You may need a Td booster every 10 years. Varicella (chickenpox) vaccine  You may need this vaccine if you have not already been vaccinated. Zoster (shingles) vaccine  You may need this after age 1. Pneumococcal conjugate (PCV13) vaccine  One dose is recommended after age 66. Pneumococcal polysaccharide (PPSV23) vaccine  One dose is recommended after age 39. Measles, mumps, and rubella (MMR) vaccine  You may need at least one dose of MMR if you were born in 1957 or later. You may also need a second dose. Meningococcal conjugate (MenACWY) vaccine  You may need this if you have certain conditions. Hepatitis A vaccine  You may need this if you have certain conditions or if you travel or work in places where you may be exposed to hepatitis A. Hepatitis B vaccine  You may need this if you have certain conditions or if you travel or work in places where you may be exposed to hepatitis B. Haemophilus influenzae type b (Hib) vaccine  You may need this if you have certain conditions. You may receive vaccines as individual doses or as more than one vaccine together in one shot (combination vaccines). Talk with your health care provider about the risks and benefits of combination vaccines. What tests do I need? Blood tests  Lipid and cholesterol levels. These may be checked every 5 years, or more frequently depending on your overall health.  Hepatitis C test.  Hepatitis B test. Screening  Lung cancer screening. You may have this screening every year starting at age 21 if you have a 30-pack-year history of smoking and currently smoke or have quit within the past 15 years.  Colorectal cancer screening. All adults should have this screening starting at age 30 and continuing until age 26. Your health  care provider may recommend screening at age 40 if you are at increased risk. You will have tests every 1-10 years, depending on your results and the type of screening test.  Prostate cancer screening. Recommendations will vary depending on your family history and other risks.  Diabetes screening. This is done by checking your blood sugar (glucose) after you have not eaten for a while (fasting). You may have this done every 1-3 years.  Abdominal aortic aneurysm (AAA) screening. You may need this if you are a current or former smoker.  Sexually transmitted disease (STD) testing. Follow these instructions at home: Eating and drinking  Eat a diet that includes fresh fruits and vegetables, whole grains, lean protein, and low-fat dairy products. Limit your intake of foods with high amounts of sugar, saturated fats, and salt.  Take vitamin and mineral supplements as recommended by your health care provider.  Do not drink alcohol if your health care provider tells you not to drink.  If you drink alcohol: ? Limit how much you have to 0-2 drinks a day. ? Be aware of how much alcohol is in your drink. In the [place] one drink equals one 12 oz bottle of beer (355 mL),  one 5 oz glass of wine (148 mL), or one 1 oz glass of hard liquor (44 mL). Lifestyle  Take daily care of your teeth and gums.  Stay active. Exercise for at least 30 minutes on 5 or more days each week.  Do not use any products that contain nicotine or tobacco, such as cigarettes, e-cigarettes, and chewing tobacco. If you need help quitting, ask your health care provider.  If you are sexually active, practice safe sex. Use a condom or other form of protection to prevent STIs (sexually transmitted infections).  Talk with your health care provider about taking a low-dose aspirin or statin. What's next?  Visit your health care provider once a year for a well check visit.  Ask your health care provider how often you should have your  eyes and teeth checked.  Stay up to date on all vaccines. This information is not intended to replace advice given to you by your health care provider. Make sure you discuss any questions you have with your health care provider. Document Released: 09/12/2015 Document Revised: 08/10/2018 Document Reviewed: 08/10/2018 Elsevier Patient Education  2020 [employer].

## 2019-05-24 NOTE — Progress Notes (Addendum)
Subjective:     Patient ID: Edward [name] , male    DOB: Dec 30, 1950 , 68 y.o.   MRN: [id number]   Chief Complaint  Patient presents with  . Annual Exam    HPI Here for annual physical and medicare wellness visit with our LPN. This past summer had episodes of intertrigo and saw derm for this which has helped. Has chronic post nasal drainage which is not any worse. His TD and colonoscopy is up to date, he does not recall the dates, so will bring the records to update his chart. Declined flu shot. Is willing to get the pneumonia vaccine. Saw a cardiologist last year and was not told he needed come back for FU.   Past Medical History:  Diagnosis Date  . Abnormal EKG 05/29/2018   Sinus bradycardia and LAFB with TWI V4-V6, III and aVF  . Abrasion of shoulder, left    posterior shoulder  . Benign essential HTN 05/29/2018  . Environmental allergies   . Fracture, ribs   . H/O: varicose veins   . Hearing loss   . Hypertension   . Multiple food allergies   . Rash   . Seasonal allergies   . Sinusitis      Family History  Problem Relation Age of Onset  . CVA Mother   . Heart failure Father      Current Outpatient Medications:  .  pneumococcal 13-valent conjugate vaccine (PREVNAR 13) SUSP injection, Inject 0.5 mLs into the muscle tomorrow at 10 am for 1 dose., Disp: 0.5 mL, Rfl: 0   No Known Allergies   Review of Systems  + lower leg varicosities, atopic dermatitis controled with steroid cream given by derm, has been having intermittent head tremor x 5 years. His mother had it. Has not had an eval. Has hx of back pain since teen, and as long as he does morning stretches his back does not bother him as much.  Rest of ROS is neg.  Today's Vitals   05/24/19 1133  BP: (!) 142/82  Pulse: 84  Temp: 98.6 F (37 C)  TempSrc: Oral  Weight: 196 lb 6.4 oz (89.1 kg)  Height: 6\' 1"  (1.854 m)   Body mass index is 25.91 kg/m.   Objective:  Physical Exam  BP (!) 142/82 (BP Location: Left  Arm, Patient Position: Sitting, Cuff Size: Normal)   Pulse 84   Temp 98.6 F (37 C) (Oral)   Ht 6\' 1"  (1.854 m)   Wt 196 lb 6.4 oz (89.1 kg)   BMI 25.91 kg/m   General Appearance:    Alert, cooperative, no distress, appears stated age  Head:    Normocephalic, without obvious abnormality, atraumatic  Eyes:    PERRL, conjunctiva/corneas clear, EOM's intact of both eyes       Ears:    Normal TM's and external ear canals, both ears  Nose:   Nares normal, septum midline, mucosa normal, no drainage   or sinus tenderness  Throat:   Lips, mucosa, and tongue normal; teeth and gums normal  Neck:   Supple, symmetrical, trachea midline, no adenopathy;       thyroid:  No enlargement/tenderness/nodules; no carotid   bruit  Back:     Symmetric, no curvature, ROM normal.  Lungs:     Clear to auscultation bilaterally, respirations unlabored  Chest wall:    No tenderness or deformity  Heart:    Regular rate and rhythm, S1 and S2 normal, no murmur, rub   or  gallop  Abdomen:     Soft, non-tender, bowel sounds active all four quadrants,    no masses, no organomegaly  Genitalia:    Normal male without lesion, discharge or tenderness  Rectal:    Normal tone, no masses or tenderness. Prostate feels like it has a raised area in the center, but is not tender.    guaiac negative stool  Extremities:   Extremities normal, atraumatic, no cyanosis or edema  Pulses:   2+ and symmetric all extremities. Has prominent varicosities on lower anterior legs.   Skin:   Skin color, texture, turgor normal, has faint pink rash of about 3x4 cm on L shin.   Lymph nodes:   Cervical, supraclavicular, and axillary nodes normal  Neurologic:   CNII-XII intact. Normal strength, sensation and reflexes      Throughout. Normal Romberg, tandem gait, heel and tip toe gait. Has a slight head and neck fine tremor I had not noticed in the past, til now. No hand tremor.   Assessment And Plan:  1. Abnormal EKG- unchanged from last year -  EKG 12-Lead  2. Annual physical exam- routine - POCT Urinalysis Dipstick (81002) - COMPLETE METABOLIC PANEL WITH GFR - CBC no Diff - PSA - Lipid Profile - TSH  3. Abnormal prostate exam- new, asymptomatic - Ambulatory referral to Urology 4- Leukocytes in urine- asymptomatic. Urine sent out for culture.  4. Benign head tremor- chronic. Has never had it evaluated.  - Ambulatory referral to Neurology 5- Dilatated ascending aorta- chronic. Ref to cardiology for Fu in December.    Fu 6 months.       Kaylynne Andres RODRIGUEZ-SOUTHWORTH, PA-C    THE PATIENT IS ENCOURAGED TO PRACTICE SOCIAL DISTANCING DUE TO THE COVID-19 PANDEMIC.

## 2019-05-25 LAB — CBC
Hematocrit: 42 % (ref 37.5–51.0)
Hemoglobin: 13.9 g/dL (ref 13.0–17.7)
MCH: 31.3 pg (ref 26.6–33.0)
MCHC: 33.1 g/dL (ref 31.5–35.7)
MCV: 95 fL (ref 79–97)
Platelets: 307 10*3/uL (ref 150–450)
RBC: 4.44 x10E6/uL (ref 4.14–5.80)
RDW: 12.1 % (ref 11.6–15.4)
WBC: 6.3 10*3/uL (ref 3.4–10.8)

## 2019-05-25 LAB — TSH: TSH: 1.65 u[IU]/mL (ref 0.450–4.500)

## 2019-05-25 LAB — LIPID PANEL
Chol/HDL Ratio: 3.7 ratio (ref 0.0–5.0)
Cholesterol, Total: 176 mg/dL (ref 100–199)
HDL: 48 mg/dL (ref >39)
LDL Chol Calc (NIH): 114 mg/dL — ABNORMAL HIGH (ref 0–99)
Triglycerides: 72 mg/dL (ref 0–149)
VLDL Cholesterol Cal: 14 mg/dL (ref 5–40)

## 2019-05-25 LAB — PSA: Prostate Specific Ag, Serum: 2 ng/mL (ref 0.0–4.0)

## 2019-05-25 LAB — HEPATITIS C ANTIBODY: Hep C Virus Ab: 0.2 s/co ratio (ref 0.0–0.9)

## 2019-05-28 LAB — URINE CULTURE

## 2019-05-29 ENCOUNTER — Encounter: Payer: Self-pay | Admitting: Neurology

## 2019-05-31 ENCOUNTER — Other Ambulatory Visit: Payer: Self-pay | Admitting: Internal Medicine

## 2019-05-31 MED ORDER — SULFAMETHOXAZOLE-TRIMETHOPRIM 800-160 MG PO TABS: 1.0000 | ORAL_TABLET | Freq: Two times a day (BID) | ORAL | 0 refills | Status: DC

## 2019-05-31 NOTE — Progress Notes (Signed)
rx sent for + urine culture

## 2019-07-11 ENCOUNTER — Encounter: Payer: Self-pay | Admitting: Cardiology

## 2019-07-11 ENCOUNTER — Other Ambulatory Visit: Payer: Self-pay

## 2019-07-11 ENCOUNTER — Ambulatory Visit: Payer: Medicare HMO | Admitting: Cardiology

## 2019-07-11 VITALS — BP 132/86 | HR 76 | Ht 73.0 in | Wt 196.8 lb

## 2019-07-11 DIAGNOSIS — I7121 Aneurysm of the ascending aorta, without rupture: Secondary | ICD-10-CM

## 2019-07-11 DIAGNOSIS — I712 Thoracic aortic aneurysm, without rupture: Secondary | ICD-10-CM

## 2019-07-11 DIAGNOSIS — E7800 Pure hypercholesterolemia, unspecified: Secondary | ICD-10-CM

## 2019-07-11 DIAGNOSIS — R9431 Abnormal electrocardiogram [ECG] [EKG]: Secondary | ICD-10-CM

## 2019-07-11 DIAGNOSIS — I10 Essential (primary) hypertension: Secondary | ICD-10-CM

## 2019-07-11 NOTE — Addendum Note (Signed)
Addended by: Nuala Alpha on: 07/11/2019 04:34 PM   Modules accepted: Orders

## 2019-07-11 NOTE — Progress Notes (Signed)
Cardiology Office Note:    Date:  07/11/2019   ID:  [name], DOB [birth year], MRN [id number]  PCP:  Shelby Mattocks, PA-C  Cardiologist:  No primary care provider on file.    Referring MD: Carol Ada*   Chief Complaint  Patient presents with  . Follow-up    abnormal EKG, HTN, aortic aneursym, HLD    History of Present Illness:    Edward [name] is a 68 y.o. male with a hx of varicose veins, hypertension, seasonal allergies who was initially referred for evaluation of abnormal EKG and sinus bradycardia 58 bpm with left anterior fascicular block and nonspecific T wave and abnormality. He has a hx of CVA after taking a long plane ride to [facility] and then flew back and afterwards suffered an acute CVA. Work-up with echo showed an enlarged left side of the heart but nothing further was ever done about it.  EKG in review showed T wave inversions in V4 through V6 with flattening of the T wave in V3 and inverted T waves in 3 and aVF.  Repeat 2D echo 07/2018 showed low normal LVF with EF 50-55%, mild LVH and G1DD.  The ascending aorta was mildly enlarged at 43mm. There was mild MR.  Nuclear stress test showed no ischemia.   He is here today for followup and is doing well.  He denies any chest pain or pressure, SOB, DOE, PND, orthopnea, LE edema, dizziness, palpitations or syncope. He is compliant with his meds and is tolerating meds with no SE.    Past Medical History:  Diagnosis Date  . Abnormal EKG 05/29/2018   Sinus bradycardia and LAFB with TWI V4-V6, III and aVF  . Abrasion of shoulder, left    posterior shoulder  . Benign essential HTN 05/29/2018  . Environmental allergies   . Fracture, ribs   . H/O: varicose veins   . Hearing loss   . Hypertension   . Multiple food allergies   . Rash   . Seasonal allergies   . Sinusitis     History reviewed. No pertinent surgical history.  Current Medications: No outpatient medications have been marked as  taking for the 07/11/19 encounter (Office Visit) with Sueanne Margarita, MD.     Allergies:   Patient has no known allergies.   Social History   Socioeconomic History  . Marital status: Married    Spouse name: Not on file  . Number of children: Not on file  . Years of education: Not on file  . Highest education level: Not on file  Occupational History  . Not on file  Social Needs  . Financial resource strain: Not hard at all  . Food insecurity    Worry: Never true    Inability: Never true  . Transportation needs    Medical: No    Non-medical: No  Tobacco Use  . Smoking status: Never Smoker  . Smokeless tobacco: Never Used  Substance and Sexual Activity  . Alcohol use: Yes    Comment: occassion  . Drug use: Never  . Sexual activity: Yes  Lifestyle  . Physical activity    Days per week: 0 days    Minutes per session: 0 min  . Stress: Not at all  Relationships  . Social Herbalist on phone: Not on file    Gets together: Not on file    Attends religious service: Not on file    Active member of club or organization: Not on  file    Attends meetings of clubs or organizations: Not on file    Relationship status: Not on file  Other Topics Concern  . Not on file  Social History Narrative  . Not on file     Family History: The patient's family history includes CVA in his mother; Heart failure in his father.  ROS:   Please see the history of present illness.    ROS  All other systems reviewed and negative.   EKGs/Labs/Other Studies Reviewed:    The following studies were reviewed today: 2D echo and stress test  EKG:  EKG is not ordered today.    Recent Labs: 05/24/2019: Hemoglobin 13.9; Platelets 307; TSH 1.650   Recent Lipid Panel    Component Value Date/Time   CHOL 176 05/24/2019 1242   TRIG 72 05/24/2019 1242   HDL 48 05/24/2019 1242   CHOLHDL 3.7 05/24/2019 1242   LDLCALC 114 (H) 05/24/2019 1242    Physical Exam:    VS:  BP 132/86    Pulse 76   Ht 6\' 1"  (1.854 m)   Wt 196 lb 12.8 oz (89.3 kg)   SpO2 98%   BMI 25.96 kg/m     Wt Readings from Last 3 Encounters:  07/11/19 196 lb 12.8 oz (89.3 kg)  05/24/19 196 lb (88.9 kg)  05/24/19 196 lb 6.4 oz (89.1 kg)     GEN:  Well nourished, well developed in no acute distress HEENT: Normal NECK: No JVD; No carotid bruits LYMPHATICS: No lymphadenopathy CARDIAC: RRR, no murmurs, rubs, gallops RESPIRATORY:  Clear to auscultation without rales, wheezing or rhonchi  ABDOMEN: Soft, non-tender, non-distended MUSCULOSKELETAL:  No edema; No deformity  SKIN: Warm and dry NEUROLOGIC:  Alert and oriented x 3 PSYCHIATRIC:  Normal affect   ASSESSMENT:    1. Abnormal EKG   2. Benign essential HTN   3. Ascending aortic aneurysm ([place])   4. Pure hypercholesterolemia    PLAN:    In order of problems listed above:  1.  Abnormal EKG -nuclear stress test showed no ischemia a year go -denies any CP or SOB  2.  HTN -BP controlled on exam -he has not required any antihypertensive meds.  3.  Mild ascending aortic aneurysm -ascending aorta measured 59mm by echo 2019 -chest CTA to assess aorta for stability and also abdominal CTA to assess the rest of his aorta -LDL was 114 and would like to be < 70  4.  HLD -LDL in Sept 2019 was 114 -with aortic aneurysm would like LDL < 70.   -he would like to try diet first as he has been eating a lot of fast food -I will repeat an FLP in 4 months    Medication Adjustments/Labs and Tests Ordered: Current medicines are reviewed at length with the patient today.  Concerns regarding medicines are outlined above.  No orders of the defined types were placed in this encounter.  No orders of the defined types were placed in this encounter.   Signed, Fransico Him, MD  07/11/2019 3:55 PM    [place]

## 2019-07-11 NOTE — Patient Instructions (Addendum)
Medication Instructions:   Your physician recommends that you continue on your current medications as directed. Please refer to the Current Medication list given to you today.  *If you need a refill on your cardiac medications before your next appointment, please call your pharmacy*   Lab Work:  IN 4 MONTHS TO CHECK LIPIDS--PLEASE COME FASTING TO THIS APPOINTMENT  If you have labs (blood work) drawn today and your tests are completely normal, you will receive your results only by: Marland Kitchen MyChart Message (if you have MyChart) OR . A paper copy in the mail If you have any lab test that is abnormal or we need to change your treatment, we will call you to review the results.   Testing/Procedures:  CT ANGIO OF THE ABDOMEN--YOUR CT ANGIO OF THE CHEST WILL BE DONE SAME DAY AND TIME --THIS IS FOR YOUR ASCENDING AORTIC ANEURYSM  CT ANGIO OF THE CHEST--YOUR CT ANGIO OF THE CHEST WILL BE DONE SAME DAY AND TIME--THIS IS FOR YOUR ASCENDING AORTIC ANEURYSM   Follow-Up: At [facility], you and your health needs are our priority.  As part of our continuing mission to provide you with exceptional heart care, we have created designated Provider Care Teams.  These Care Teams include your primary Cardiologist (physician) and Advanced Practice Providers (APPs -  Physician Assistants and Nurse Practitioners) who all work together to provide you with the care you need, when you need it.  Your next appointment:   12 months  The format for your next appointment:   In Person  Provider:   Fransico Him, MD

## 2019-07-12 LAB — BASIC METABOLIC PANEL
BUN/Creatinine Ratio: 18 (ref 10–24)
BUN: 21 mg/dL (ref 8–27)
CO2: 24 mmol/L (ref 20–29)
Calcium: 9.4 mg/dL (ref 8.6–10.2)
Chloride: 98 mmol/L (ref 96–106)
Creatinine, Ser: 1.2 mg/dL (ref 0.76–1.27)
GFR calc Af Amer: 72 mL/min/{1.73_m2} (ref >59)
GFR calc non Af Amer: 63 mL/min/{1.73_m2} (ref >59)
Glucose: 82 mg/dL (ref 65–99)
Potassium: 4.4 mmol/L (ref 3.5–5.2)
Sodium: 141 mmol/L (ref 134–144)

## 2019-07-13 ENCOUNTER — Ambulatory Visit: Payer: Medicare HMO | Admitting: Neurology

## 2019-07-29 NOTE — Progress Notes (Signed)
Subjective:   Edward [name] was seen in consultation in the movement disorder clinic at the request of Rodriguez-Southworth, S*.  The evaluation is for head tremor.  Tremor started approximately 3-4 years ago and involves the head but not the hands.  It doesn't bother the patient.  He does know that his mother had tremor in her later years in the head.  Tremor is in the "no"  Direction, he thinks, but isn't sure.  Affected by caffeine: unknown (drinks 2 cups coffee/day) Affected by alcohol: unknown (drinks 2 drinks/week) Affected by stress: unknown   Current/Previously tried tremor medications: n/a  Current medications that may exacerbate tremor:  n/a  Outside reports reviewed: historical medical records, office notes and referral letter/letters.  No Known Allergies  No current outpatient medications  Past Medical History:  Diagnosis Date  . Abnormal EKG 05/29/2018   Sinus bradycardia and LAFB with TWI V4-V6, III and aVF  . Benign essential HTN 05/29/2018  . Environmental allergies   . Fracture, ribs   . H/O: varicose veins   . Hearing loss   . Multiple food allergies   . Rash   . Seasonal allergies   . Sinusitis   . Stroke [facility])    s/p tpa    Past Surgical History:  Procedure Laterality Date  . INGUINAL HERNIA REPAIR Left   . right hand surgery      Social History   Socioeconomic History  . Marital status: Married    Spouse name: Not on file  . Number of children: Not on file  . Years of education: Not on file  . Highest education level: Not on file  Occupational History  . 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  Social Needs  . Financial resource strain: Not hard at all  . Food insecurity    Worry: Never true    Inability: Never true  . Transportation needs    Medical: No    Non-medical: No  Tobacco Use  . Smoking status: Former Research scientist (life sciences)  . Smokeless tobacco: Never Used  . Tobacco comment: quit 40+ years ago  Substance and Sexual Activity  .  Alcohol use: Yes    Comment: 2 times per week  . Drug use: Never  . Sexual activity: Yes  Lifestyle  . Physical activity    Days per week: 0 days    Minutes per session: 0 min  . Stress: Not at all  Relationships  . Social Herbalist on phone: Not on file    Gets together: Not on file    Attends religious service: Not on file    Active member of club or organization: Not on file    Attends meetings of clubs or organizations: Not on file    Relationship status: Not on file  . Intimate partner violence    Fear of current or ex partner: No    Emotionally abused: No    Physically abused: No    Forced sexual activity: No  Other Topics Concern  . Not on file  Social History Narrative  . Not on file    Family Status  Relation Name Status  . Mother  Deceased  . Father  Deceased  . Sister 2 Alive  . Brother 2 Alive  . Brother  Deceased  . Daughter 2 Alive    Review of Systems Review of Systems  Constitutional: Negative.   HENT: Negative.   Eyes: Negative.   Respiratory: Negative.  Cardiovascular: Negative.   Gastrointestinal: Negative.   Genitourinary: Negative.   Musculoskeletal: Negative.   Skin: Negative.   Endo/Heme/Allergies: Negative.   Psychiatric/Behavioral: Negative.      Objective:   VITALS:   Vitals:   07/31/19 1340  BP: (!) 162/94  Pulse: 78  Resp: 16  SpO2: 99%  Weight: 195 lb (88.5 kg)  Height: 6\' 1"  (1.854 m)   Gen:  Appears stated age and in NAD. HEENT:  Normocephalic, atraumatic. The mucous membranes are moist. The superficial temporal arteries are without ropiness or tenderness. Cardiovascular: Regular rate and rhythm. Lungs: Clear to auscultation bilaterally. Neck: There are no carotid bruits noted bilaterally.  NEUROLOGICAL:  Orientation:  The patient is alert and oriented x 3.  Recent and remote memory are intact.  Attention span and concentration are normal.  Able to name objects and repeat without trouble.  Fund of  knowledge is appropriate Cranial nerves: There is good facial symmetry. Extraocular muscles are intact and visual fields are full to confrontational testing. Speech is fluent and clear. Soft palate rises symmetrically and there is no tongue deviation. Hearing is intact to conversational tone. Tone: Tone is good throughout. Sensation: Sensation is intact to light touch and pinprick throughout (facial, trunk, extremities). Vibration is intact at the bilateral big toe. There is no extinction with double simultaneous stimulation. There is no sensory dermatomal level identified. Coordination:  The patient has no dysdiadichokinesia or dysmetria. Motor: Strength is 5/5 in the bilateral upper and lower extremities.  Shoulder shrug is equal bilaterally.  There is no pronator drift.  There are no fasciculations noted. DTR's: Deep tendon reflexes are 2/4 at the bilateral biceps, triceps, brachioradialis, 2+ at the bilateral patella and achilles.  Plantar responses are downgoing bilaterally. Gait and Station: The patient is able to ambulate without difficulty. The patient is able to heel toe walk without any difficulty. The patient is able to ambulate in a tandem fashion. The patient is able to stand in the Romberg position.   MOVEMENT EXAM: Tremor:  There is no rest tremor.  There is no postural tremor.  There is no intention tremor.  There is very rare head tremor in the "yes" direction.  This tremor was not position dependent on the position of the head and did not appear dystonic in nature.    Chemistry      Component Value Date/Time   NA 141 07/11/2019 1639   K 4.4 07/11/2019 1639   CL 98 07/11/2019 1639   CO2 24 07/11/2019 1639   BUN 21 07/11/2019 1639   CREATININE 1.20 07/11/2019 1639      Component Value Date/Time   CALCIUM 9.4 07/11/2019 1639     Lab Results  Component Value Date   TSH 1.650 05/24/2019        Assessment/Plan:   1.  Essential Tremor.  -Head tremor was likely due to  essential tremor.  Reassured him that I saw no evidence of Parkinson's disease.  Head tremor was rare and intermittent today.  Discussed with the patient that generally head tremor is fairly resistant to oral medications.  Head tremor actually does not bother him at all, but his family does notice it.  Told him I would not recommend any medication for it right now, especially since it does not bother him.  He agrees.  He can certainly let me know should it become more bothersome or should new neurologic issues arise.  Otherwise, I will see him back on an as-needed basis.  CC:  Rodriguez-Southworth, Sunday Spillers, PA-C

## 2019-07-31 ENCOUNTER — Encounter: Payer: Self-pay | Admitting: Neurology

## 2019-07-31 ENCOUNTER — Other Ambulatory Visit: Payer: Self-pay

## 2019-07-31 ENCOUNTER — Ambulatory Visit: Payer: Medicare HMO | Admitting: Neurology

## 2019-07-31 VITALS — BP 162/94 | HR 78 | Resp 16 | Ht 73.0 in | Wt 195.0 lb

## 2019-07-31 DIAGNOSIS — G250 Essential tremor: Secondary | ICD-10-CM | POA: Diagnosis not present

## 2019-07-31 NOTE — Patient Instructions (Signed)
The physicians and staff at [facility] are committed to providing excellent care. You may receive a survey requesting feedback about your experience at our office. We strive to receive "very good" responses to the survey questions. If you feel that your experience would prevent you from giving the office a "very good " response, please contact our office to try to remedy the situation. We may be reached at [phone]. Thank you for taking the time out of your busy day to complete the survey.  

## 2019-08-07 ENCOUNTER — Encounter (HOSPITAL_COMMUNITY): Payer: Self-pay | Admitting: Cardiology

## 2019-08-08 ENCOUNTER — Other Ambulatory Visit: Payer: Self-pay

## 2019-08-08 ENCOUNTER — Ambulatory Visit (INDEPENDENT_AMBULATORY_CARE_PROVIDER_SITE_OTHER)
Admission: RE | Admit: 2019-08-08 | Discharge: 2019-08-08 | Disposition: A | Discharge status: Home / Self Care | Payer: Medicare HMO | Source: Ambulatory Visit | Attending: Cardiology | Admitting: Cardiology

## 2019-08-08 DIAGNOSIS — I7121 Aneurysm of the ascending aorta, without rupture: Secondary | ICD-10-CM

## 2019-08-08 DIAGNOSIS — I712 Thoracic aortic aneurysm, without rupture: Secondary | ICD-10-CM

## 2019-08-08 DIAGNOSIS — E7800 Pure hypercholesterolemia, unspecified: Secondary | ICD-10-CM | POA: Insufficient documentation

## 2019-08-08 DIAGNOSIS — K429 Umbilical hernia without obstruction or gangrene: Secondary | ICD-10-CM | POA: Insufficient documentation

## 2019-08-08 DIAGNOSIS — R911 Solitary pulmonary nodule: Secondary | ICD-10-CM | POA: Insufficient documentation

## 2019-08-08 DIAGNOSIS — K5732 Diverticulitis of large intestine without perforation or abscess without bleeding: Secondary | ICD-10-CM | POA: Insufficient documentation

## 2019-08-08 MED ORDER — IOHEXOL 350 MG/ML SOLN
100.0000 mL | Freq: Once | INTRAVENOUS | Status: AC | PRN
  Administered 2019-08-08: 09:00:00 100 mL via INTRAVENOUS

## 2019-08-09 ENCOUNTER — Encounter: Payer: Self-pay | Admitting: Internal Medicine

## 2019-08-09 ENCOUNTER — Other Ambulatory Visit (HOSPITAL_COMMUNITY): Payer: Self-pay | Admitting: Internal Medicine

## 2019-08-09 ENCOUNTER — Encounter (HOSPITAL_COMMUNITY): Payer: Self-pay | Admitting: Internal Medicine

## 2019-08-09 DIAGNOSIS — R911 Solitary pulmonary nodule: Secondary | ICD-10-CM

## 2019-08-09 NOTE — Progress Notes (Signed)
Order placed for L lung nodule FU which is due 01/2020

## 2019-08-09 NOTE — Progress Notes (Signed)
Reviewed

## 2019-08-14 ENCOUNTER — Telehealth: Payer: Self-pay | Admitting: *Deleted

## 2019-08-14 DIAGNOSIS — I77819 Aortic ectasia, unspecified site: Secondary | ICD-10-CM

## 2019-08-14 NOTE — Telephone Encounter (Signed)
I spoke with pt and reviewed results with him. He has received message from PCP and follow up CT has been ordered by PCP. Order placed for echo in 12/21

## 2019-08-14 NOTE — Telephone Encounter (Signed)
-----   Message from Sueanne Margarita, MD sent at 08/08/2019 11:28 AM EST ----- Abdominal CT showed ectatic ascending aorta with no aneurysm measuring 67mm.  Mild diverticulosis.  Small umbilical hernia only containing fat. 59mm nodule in the LLL - recommended followup of lung nodule in 6 months - please forward to PCP to review and followup on.  Repeat 2D echo 07/2020 for ectatic aorta

## 2019-08-16 DIAGNOSIS — L570 Actinic keratosis: Secondary | ICD-10-CM | POA: Diagnosis not present

## 2019-09-07 ENCOUNTER — Other Ambulatory Visit (HOSPITAL_COMMUNITY): Payer: Medicare HMO

## 2019-09-07 ENCOUNTER — Encounter: Payer: Self-pay | Admitting: Internal Medicine

## 2019-09-14 ENCOUNTER — Other Ambulatory Visit (HOSPITAL_COMMUNITY): Payer: Medicare HMO

## 2019-09-25 ENCOUNTER — Encounter: Payer: Self-pay | Admitting: Internal Medicine

## 2019-09-26 ENCOUNTER — Other Ambulatory Visit: Payer: Self-pay

## 2019-09-26 ENCOUNTER — Ambulatory Visit (INDEPENDENT_AMBULATORY_CARE_PROVIDER_SITE_OTHER): Payer: Medicare HMO | Admitting: Nurse Practitioner

## 2019-09-26 ENCOUNTER — Encounter: Payer: Self-pay | Admitting: Nurse Practitioner

## 2019-09-26 VITALS — BP 118/84 | HR 61 | Temp 98.4°F | Ht 69.2 in | Wt 197.4 lb

## 2019-09-26 DIAGNOSIS — R300 Dysuria: Secondary | ICD-10-CM | POA: Diagnosis not present

## 2019-09-26 DIAGNOSIS — R319 Hematuria, unspecified: Secondary | ICD-10-CM | POA: Diagnosis not present

## 2019-09-26 LAB — POCT URINALYSIS DIPSTICK
Bilirubin, UA: NEGATIVE
Glucose, UA: NEGATIVE
Ketones, UA: NEGATIVE
Nitrite, UA: NEGATIVE
Protein, UA: NEGATIVE
Spec Grav, UA: 1.015 (ref 1.010–1.025)
Urobilinogen, UA: 0.2 E.U./dL
pH, UA: 5.5 (ref 5.0–8.0)

## 2019-09-26 MED ORDER — CEFTRIAXONE SODIUM 1 G IJ SOLR
1.0000 g | Freq: Once | INTRAMUSCULAR | Status: AC
  Administered 2019-09-26: 17:00:00 1 g via INTRAMUSCULAR

## 2019-09-26 NOTE — Progress Notes (Addendum)
This visit occurred during the SARS-CoV-2 public health emergency.  Safety protocols were in place, including screening questions prior to the visit, additional usage of staff PPE, and extensive cleaning of exam room while observing appropriate contact time as indicated for disinfecting solutions.  Subjective:     Patient ID: Edward [name] , male    DOB: 02/21/1951 , 69 y.o.   MRN: [id number]   Chief Complaint  Patient presents with  . Dysuria    Patient stated he has some pain when urinating that started yesterday and has been worsening.    HPI  Dysuria  This is a new problem. The current episode started in the past 7 days. The problem occurs intermittently. Associated symptoms include frequency. Pertinent negatives include no flank pain, hematuria, hesitancy or urgency. Associated symptoms comments: Wilburn Mylar was getting uncomfortable, last night started to back off. . His past medical history is significant for recurrent UTIs (at least 8 different times over the last 20 years).     Past Medical History:  Diagnosis Date  . Abnormal EKG 05/29/2018   Sinus bradycardia and LAFB with TWI V4-V6, III and aVF  . Benign essential HTN 05/29/2018  . Environmental allergies   . Fracture, ribs   . H/O: varicose veins   . Hearing loss   . Multiple food allergies   . Rash   . Seasonal allergies   . Sinusitis   . Stroke [facility])    s/p tpa     Family History  Problem Relation Age of Onset  . CVA Mother   . Heart failure Father        CHF  . Brain cancer Brother        GBM    No current outpatient medications on file.   No Known Allergies   Review of Systems  Gastrointestinal: Negative.   Genitourinary: Positive for dysuria and frequency. Negative for difficulty urinating, flank pain, hematuria, hesitancy and urgency.  Neurological: Negative for dizziness and headaches.  Psychiatric/Behavioral: Negative.      Today's Vitals   09/26/19 1533  BP: 118/84  Pulse: 61  Temp: 98.4 F  (36.9 C)  TempSrc: Oral  Weight: 197 lb 6.4 oz (89.5 kg)  Height: 5' 9.2" (1.758 m)  PainSc: 0-No pain   Body mass index is 28.98 kg/m.   Objective:  Physical Exam Constitutional:      General: He is not in acute distress. Cardiovascular:     Rate and Rhythm: Normal rate and regular rhythm.     Pulses: Normal pulses.     Heart sounds: Normal heart sounds. No murmur.  Pulmonary:     Effort: Pulmonary effort is normal. No respiratory distress.     Breath sounds: Normal breath sounds.  Abdominal:     General: Bowel sounds are normal. There is no distension.     Palpations: Abdomen is soft.     Tenderness: There is abdominal tenderness (mild tenderness lower abdomen).  Neurological:     General: No focal deficit present.     Mental Status: He is alert and oriented to person, place, and time.     Cranial Nerves: No cranial nerve deficit.  Psychiatric:        Mood and Affect: Mood normal.        Behavior: Behavior normal.        Thought Content: Thought content normal.        Judgment: Judgment normal.         Assessment And Plan:  1. Dysuria  Due to the reoccurence of urinary symptoms - POCT Urinalysis Dipstick (81002) - Culture, Urine - cefTRIAXone (ROCEPHIN) injection 1 g  2. Hematuria, unspecified type  Moderate blood in urine  I will treat with rocephin due to previous history of urinary tract infection in September  He is scheduled to go to Urology next month - Culture, Urine - cefTRIAXone (ROCEPHIN) injection 1 g  [name], FNP    THE PATIENT IS ENCOURAGED TO PRACTICE SOCIAL DISTANCING DUE TO THE COVID-19 PANDEMIC.

## 2019-10-08 DIAGNOSIS — N402 Nodular prostate without lower urinary tract symptoms: Secondary | ICD-10-CM | POA: Diagnosis not present

## 2019-10-08 DIAGNOSIS — N5201 Erectile dysfunction due to arterial insufficiency: Secondary | ICD-10-CM | POA: Diagnosis not present

## 2019-10-22 DIAGNOSIS — D485 Neoplasm of uncertain behavior of skin: Secondary | ICD-10-CM | POA: Diagnosis not present

## 2019-10-22 DIAGNOSIS — L259 Unspecified contact dermatitis, unspecified cause: Secondary | ICD-10-CM | POA: Diagnosis not present

## 2019-11-08 ENCOUNTER — Other Ambulatory Visit: Payer: Medicare HMO

## 2019-11-08 ENCOUNTER — Other Ambulatory Visit: Payer: Self-pay

## 2019-11-08 DIAGNOSIS — R9431 Abnormal electrocardiogram [ECG] [EKG]: Secondary | ICD-10-CM

## 2019-11-08 DIAGNOSIS — I712 Thoracic aortic aneurysm, without rupture: Secondary | ICD-10-CM | POA: Diagnosis not present

## 2019-11-08 DIAGNOSIS — I10 Essential (primary) hypertension: Secondary | ICD-10-CM | POA: Diagnosis not present

## 2019-11-08 DIAGNOSIS — I7121 Aneurysm of the ascending aorta, without rupture: Secondary | ICD-10-CM

## 2019-11-08 DIAGNOSIS — E7800 Pure hypercholesterolemia, unspecified: Secondary | ICD-10-CM | POA: Diagnosis not present

## 2019-11-08 LAB — LIPID PANEL
Chol/HDL Ratio: 3.9 ratio (ref 0.0–5.0)
Cholesterol, Total: 181 mg/dL (ref 100–199)
HDL: 46 mg/dL (ref >39)
LDL Chol Calc (NIH): 118 mg/dL — ABNORMAL HIGH (ref 0–99)
Triglycerides: 94 mg/dL (ref 0–149)
VLDL Cholesterol Cal: 17 mg/dL (ref 5–40)

## 2019-11-09 ENCOUNTER — Other Ambulatory Visit: Payer: Self-pay | Admitting: Internal Medicine

## 2019-11-09 MED ORDER — ATORVASTATIN CALCIUM 20 MG PO TABS: 20.0000 mg | ORAL_TABLET | Freq: Every day | ORAL | 0 refills | Status: DC

## 2019-11-09 NOTE — Progress Notes (Unsigned)
Please make him appt for 6 weeks to have liver enzymes done since he will be starting a cholesterol med. Which I sent and recommended by his cardiologist for me to start him on.

## 2019-11-12 NOTE — Progress Notes (Signed)
This pt has an appt w/you on April 1st do you want me to push that appt out?

## 2019-11-13 ENCOUNTER — Telehealth: Payer: Self-pay

## 2019-11-13 DIAGNOSIS — E7800 Pure hypercholesterolemia, unspecified: Secondary | ICD-10-CM

## 2019-11-13 NOTE — Telephone Encounter (Signed)
The patient has been notified of the result and verbalized understanding.  Patient does not want to start on Lipitor at this time due to myalgia in the past. He states he has been exercising more but has not done well with his diet. I advised him on low cholesterol diet which he states he is going to work on. He is [place] willing to consider other medications.

## 2019-11-13 NOTE — Telephone Encounter (Signed)
-----   Message from Sueanne Margarita, MD sent at 11/08/2019  5:28 PM EST ----- After 4 months of diet LDL has actually gone up instead of improving.  Given aortic atherosclerosis he needs LDL < 70.  Please start Lipitor 20mg  daily and repeat FLp and ALT in 6 weeks

## 2019-11-13 NOTE — Telephone Encounter (Signed)
Error

## 2019-11-13 NOTE — Progress Notes (Signed)
Yes please

## 2019-11-22 ENCOUNTER — Ambulatory Visit: Payer: Medicare HMO | Admitting: Internal Medicine

## 2019-11-29 ENCOUNTER — Ambulatory Visit: Payer: Medicare HMO | Admitting: Internal Medicine

## 2019-12-24 ENCOUNTER — Other Ambulatory Visit: Payer: Medicare HMO

## 2019-12-27 ENCOUNTER — Ambulatory Visit (INDEPENDENT_AMBULATORY_CARE_PROVIDER_SITE_OTHER): Payer: Medicare HMO | Admitting: Internal Medicine

## 2019-12-27 ENCOUNTER — Other Ambulatory Visit: Payer: Self-pay

## 2019-12-27 ENCOUNTER — Encounter: Payer: Self-pay | Admitting: Internal Medicine

## 2019-12-27 VITALS — BP 118/76 | HR 83 | Temp 98.1°F | Ht 69.2 in | Wt 195.0 lb

## 2019-12-27 DIAGNOSIS — E7800 Pure hypercholesterolemia, unspecified: Secondary | ICD-10-CM

## 2019-12-27 DIAGNOSIS — R911 Solitary pulmonary nodule: Secondary | ICD-10-CM

## 2019-12-27 DIAGNOSIS — J301 Allergic rhinitis due to pollen: Secondary | ICD-10-CM

## 2019-12-27 DIAGNOSIS — I10 Essential (primary) hypertension: Secondary | ICD-10-CM | POA: Diagnosis not present

## 2019-12-27 DIAGNOSIS — E782 Mixed hyperlipidemia: Secondary | ICD-10-CM

## 2019-12-27 NOTE — Progress Notes (Signed)
This visit occurred during the SARS-CoV-2 public health emergency.  Safety protocols were in place, including screening questions prior to the visit, additional usage of staff PPE, and extensive cleaning of exam room while observing appropriate contact time as indicated for disinfecting solutions.  Subjective:     Patient ID: Edward [name] , male    DOB: 11/16/1950 , 69 y.o.   MRN: [id number]   Chief Complaint  Patient presents with  . Hyperlipidemia    HPI  Has been eating less meat, more nuts vegetables and fish Continues walking and goes to the gym 1=3 times a week.   Past Medical History:  Diagnosis Date  . Abnormal EKG 05/29/2018   Sinus bradycardia and LAFB with TWI V4-V6, III and aVF  . Benign essential HTN 05/29/2018  . Environmental allergies   . Fracture, ribs   . H/O: varicose veins   . Hearing loss   . Multiple food allergies   . Rash   . Seasonal allergies   . Sinusitis   . Stroke [facility])    s/p tpa     Family History  Problem Relation Age of Onset  . CVA Mother   . Heart failure Father        CHF  . Brain cancer Brother        GBM     Current Outpatient Medications:  .  atorvastatin (LIPITOR) 20 MG tablet, Take 1 tablet (20 mg total) by mouth daily. For elevated LDL, needs labs in 6 weeks (Patient not taking: Reported on 12/27/2019), Disp: 90 tablet, Rfl: 0   No Known Allergies   Review of Systems  Review of Systems  Constitutional: Negative for diaphoresis and unexpected weight change.  HENT: Negative for tinnitus. His allergies are actin up with rhinitis and takes zyrtec   Eyes: Negative for visual disturbance.  Respiratory: Negative for chest tightness and shortness of breath.Has wheezed on occasion.  Cardiovascular: Negative for chest pain, palpitations and leg swelling.  Gastrointestinal: Negative for constipation, diarrhea and nausea.  Endocrine: Negative for polydipsia, polyphagia and polyuria.  Genitourinary: Negative for dysuria and frequency.   Skin: Negative for rash and wound.  Neurological: Negative for dizziness, speech difficulty, weakness, numbness and headaches.   Today's Vitals   12/27/19 0834  BP: 118/76  Pulse: 83  Temp: 98.1 F (36.7 C)  TempSrc: Oral  SpO2: 96%  Weight: 195 lb (88.5 kg)  Height: 5' 9.2" (1.758 m)   Body mass index is 28.63 kg/m.   Objective:  Physical Exam  Constitutional: he is oriented to person, place, and time. he appears well-developed and well-nourished. No distress.  HENT:  Head: Normocephalic and atraumatic.  Right Ear: External ear normal.  Left Ear: External ear normal.  Nose: Nose normal.  Eyes: Conjunctivae are normal. Right eye exhibits no discharge. Left eye exhibits no discharge. No scleral icterus.  Neck: Neck supple. No thyromegaly present.  No carotid bruits bilaterally  Cardiovascular: Normal rate and regular rhythm.  No murmur heard. Pulmonary/Chest: Effort normal and breath sounds normal. No respiratory distress.  Musculoskeletal: Normal range of motion. he exhibits no edema.  Lymphadenopathy: he has no cervical adenopathy.  Neurological: he is alert and oriented to person, place, and time.  Skin: Skin is warm and dry. Capillary refill takes less than 2 seconds. No rash noted. he is not diaphoretic.  Psychiatric: he has a normal mood and affect. Her behavior is normal. Judgment and thought content normal.  Nursing note reviewed.    Assessment And  Plan:    1. Benign essential HTN- stable.   2. Elevated LDL cholesterol level   - Lipid panel - CMP14 + Anion Gap FU in 6 months with [name] 3. Solitary pulmonary nodule- chronic. We discussed he needs repeated chest CT FU which was already ordered by his cardiologist and is due in June.  4-. Allergic rhinitis- acute, will continue Zyrtec since this helps  Dequann Vandervelden RODRIGUEZ-SOUTHWORTH, PA-C    THE PATIENT IS ENCOURAGED TO PRACTICE SOCIAL DISTANCING DUE TO THE COVID-19 PANDEMIC.

## 2020-03-24 ENCOUNTER — Other Ambulatory Visit: Payer: Medicare HMO

## 2020-03-25 ENCOUNTER — Other Ambulatory Visit: Payer: Medicare HMO

## 2020-03-25 ENCOUNTER — Other Ambulatory Visit: Payer: Self-pay

## 2020-03-25 DIAGNOSIS — E7800 Pure hypercholesterolemia, unspecified: Secondary | ICD-10-CM

## 2020-03-25 LAB — LIPID PANEL
Chol/HDL Ratio: 4.4 ratio (ref 0.0–5.0)
Cholesterol, Total: 182 mg/dL (ref 100–199)
HDL: 41 mg/dL (ref >39)
LDL Chol Calc (NIH): 128 mg/dL — ABNORMAL HIGH (ref 0–99)
Triglycerides: 69 mg/dL (ref 0–149)
VLDL Cholesterol Cal: 13 mg/dL (ref 5–40)

## 2020-03-26 NOTE — Progress Notes (Signed)
Call patient to confirm if he is taking his lipitor daily.

## 2020-04-02 ENCOUNTER — Telehealth: Payer: Self-pay

## 2020-04-02 DIAGNOSIS — E7800 Pure hypercholesterolemia, unspecified: Secondary | ICD-10-CM

## 2020-04-02 NOTE — Addendum Note (Signed)
Addended by: Antonieta Iba on: 04/02/2020 07:57 AM   Modules accepted: Orders

## 2020-04-02 NOTE — Telephone Encounter (Signed)
Referral has been placed for patient to see lipid clinic.

## 2020-04-02 NOTE — Telephone Encounter (Signed)
-----   Message from Sueanne Margarita, MD sent at 04/01/2020 10:37 PM EDT ----- Please set up appt with lipid clinic  Traci ----- Message ----- From: Antonieta Iba, RN Sent: 04/01/2020   4:45 PM EDT To: Sueanne Margarita, MD  Spoke with the patient who states that he has not been taking Lipitor. He does not want to start back on it because it has caused him severe muscle pains in the past. Patient states that he had been wanting to work on getting his cholesterol down with diet but willing to talk about other options. Will route to Dr. Radford Pax for further recommendations. Patient states he is willing to see pharmacist in lipid clinic to discuss further management.

## 2020-04-07 DIAGNOSIS — N402 Nodular prostate without lower urinary tract symptoms: Secondary | ICD-10-CM | POA: Diagnosis not present

## 2020-04-10 ENCOUNTER — Encounter: Payer: Self-pay | Admitting: General Practice

## 2020-05-02 DIAGNOSIS — H5203 Hypermetropia, bilateral: Secondary | ICD-10-CM | POA: Diagnosis not present

## 2020-05-29 ENCOUNTER — Encounter: Payer: Medicare HMO | Admitting: Internal Medicine

## 2020-05-29 ENCOUNTER — Ambulatory Visit: Payer: Medicare HMO

## 2020-06-10 NOTE — Progress Notes (Signed)
Cardiology Office Note   Date:  06/19/2020   ID:  Edward [name], DOB May 31, 1951, MRN [id number]  PCP:  Edward Chard, MD  Cardiologist: Dr. Fransico Him, MD  Chief Complaint  Patient presents with  . Follow-up    History of Present Illness: Edward [name] is a 69 y.o. male who presents for follow-up, seen for Dr. Radford [name].  [name] has a history of varicose veins, hypertension, CVA and seasonal allergies who was initially referred for the evaluation of an abnormal EKG and sinus bradycardia with left anterior fascicular block and nonspecific T wave abnormalities.  He suffered an acute CVA after taking a trip to [place].  Work-up at that time showed an enlarged LV however no further work-up was completed.  Repeat echocardiogram 07/2018 showed low normal LVEF at 50 to 55%, mild LVH and G1 DD.  The ascending aorta was mildly enlarged at 42 mm with mild MR.  Nuclear stress test at that time showed no ischemia.  He was most recently seen by Dr. Radford [name] 07/11/2019 at which time he had no specific CV symptoms.  Chest CTA was obtained to assess aorta for stability which showed a thoracoabdominal aorta measuring 3.9 cm.   Today he states that he is doing well. We discussed his LDL finding on lab work from last week. He remains reluctant to take statins st this time and prefers to continue with diet and exercise modifications. We did also discuss other options other than statins. He has no CV symptoms such as chest pain, SOB, PND, LE edema, dizziness or palpitations.   Past Medical History:  Diagnosis Date  . Abnormal EKG 05/29/2018   Sinus bradycardia and LAFB with TWI V4-V6, III and aVF  . Benign essential HTN 05/29/2018  . Environmental allergies   . Fracture, ribs   . H/O: varicose veins   . Hearing loss   . Multiple food allergies   . Rash   . Seasonal allergies   . Sinusitis   . Stroke [facility])    s/p tpa    Past Surgical History:  Procedure Laterality Date  . INGUINAL HERNIA REPAIR  Left   . right hand surgery       Current Outpatient Medications  Medication Sig Dispense Refill  . aspirin EC 81 MG tablet Take 81 mg by mouth daily. Swallow whole.    . sildenafil (VIAGRA) 100 MG tablet Take 1 tablet by mouth as needed.      No current facility-administered medications for this visit.    Allergies:   Patient has no known allergies.    Social History:  The patient  reports that he has quit smoking. He has never used smokeless tobacco. He reports current alcohol use. He reports that he does not use drugs.   Family History:  The patient's family history includes Brain cancer in his brother; CVA in his mother; Heart failure in his father.    ROS:  Please see the history of present illness. Otherwise, review of systems are positive for none.   All other systems are reviewed and negative.    PHYSICAL EXAM: VS:  BP (!) 132/104   Pulse 87   Ht 6' (1.829 m)   Wt 194 lb (88 kg)   SpO2 98%   BMI 26.31 kg/m  , BMI Body mass index is 26.31 kg/m.   General: Well developed, well nourished, NAD Lungs:Clear to ausculation bilaterally. No wheezes, rales, or rhonchi. Breathing is unlabored. Cardiovascular: RRR with S1 S2. No  murmurs Extremities: No edema. Radial pulses 2+ bilaterally Neuro: Alert and oriented. No focal deficits. No facial asymmetry. MAE spontaneously. Psych: Responds to questions appropriately with normal affect.     EKG:  EKG is ordered today. The ekg ordered today demonstrates NSR with HR 74bpm, no acute change from prior tracing    Recent Labs: 07/11/2019: BUN 21; Creatinine, Ser 1.20; Potassium 4.4; Sodium 141    Lipid Panel    Component Value Date/Time   CHOL 189 06/11/2020 1204   TRIG 99 06/11/2020 1204   HDL 44 06/11/2020 1204   CHOLHDL 4.3 06/11/2020 1204   LDLCALC 127 (H) 06/11/2020 1204    Wt Readings from Last 3 Encounters:  06/19/20 194 lb (88 kg)  06/11/20 194 lb (88 kg)  06/11/20 194 lb 12.8 oz (88.4 kg)    Other studies  Reviewed: Additional studies/ records that were reviewed today include: Review of the above records demonstrates:   CT chest/ab 08/08/2019:   IMPRESSION: 1. Thoracoabdominal aorta without evidence of aneurysm or dissection. Ectasia of the ascending thoracic aorta measuring 3.9 cm in AP diameter. Recommend annual imaging followup by CTA or MRA. This recommendation follows 2010 ACCF/AHA/AATS/ACR/ASA/SCA/SCAI/SIR/STS/SVM Guidelines for the Diagnosis and Management of Patients with Thoracic Aortic Disease. Circulation.2010; 121: L875-I433. Aortic aneurysm NOS (ICD10-I71.9).  2.  No acute findings in the chest or abdomen.  3. 6 mm nodule over the lateral left lower lobe. Recommend follow-up CT 6 months.  ASSESSMENT AND PLAN:  1.  HTN: -Stable, 132/104>>>reports more normal readings at home  -Not currently on antihypertensive medications   2.  Mild ascending aortic aneurysm: -Found on echocardiogram in 2019 which showed an a sending aorta measuring 42 mm -Subsequent chest CTA of the chest and abdomen showed a thoracoabdominal aorta measuring 3.9 cm -Repeat echocardiogram 07/2020  3.  HLD: -Last LDL, 127 -LDL goal < 70 given aortic aneurysm -Patient prefers lifestyle modifications with diet and exercise prior to starting statin therapy -If open to statins in the future, would try Crestor as he reports myalgias with Lipitor    4.  Lateral left lower lobe lung nodule: -Noted during chest CTA 08/08/2019 measuring 6 mm nodule  -Needs follow up   Current medicines are reviewed at length with the patient today.  The patient does not have concerns regarding medicines.  The following changes have been made:  no change  Labs/ tests ordered today include: None   Orders Placed This Encounter  Procedures  . EKG 12-Lead  . ECHOCARDIOGRAM COMPLETE    Disposition:   FU with Dr. Radford [name] in 1 year  Signed, Kathyrn Drown, NP  06/19/2020 1:08 PM    [address], [place], [place]  [address] Phone: [phone]; Fax: [phone]

## 2020-06-11 ENCOUNTER — Encounter: Payer: Self-pay | Admitting: Nurse Practitioner

## 2020-06-11 ENCOUNTER — Other Ambulatory Visit: Payer: Self-pay

## 2020-06-11 ENCOUNTER — Ambulatory Visit: Payer: Medicare HMO

## 2020-06-11 ENCOUNTER — Encounter: Payer: Medicare HMO | Admitting: Nurse Practitioner

## 2020-06-11 ENCOUNTER — Ambulatory Visit (INDEPENDENT_AMBULATORY_CARE_PROVIDER_SITE_OTHER): Payer: Medicare HMO | Admitting: Nurse Practitioner

## 2020-06-11 ENCOUNTER — Ambulatory Visit (INDEPENDENT_AMBULATORY_CARE_PROVIDER_SITE_OTHER): Payer: Medicare HMO

## 2020-06-11 VITALS — BP 112/78 | HR 67 | Temp 98.7°F | Ht 70.2 in | Wt 194.0 lb

## 2020-06-11 VITALS — BP 112/78 | HR 67 | Temp 98.7°F | Ht 70.2 in | Wt 194.8 lb

## 2020-06-11 DIAGNOSIS — E782 Mixed hyperlipidemia: Secondary | ICD-10-CM | POA: Diagnosis not present

## 2020-06-11 DIAGNOSIS — I712 Thoracic aortic aneurysm, without rupture: Secondary | ICD-10-CM | POA: Diagnosis not present

## 2020-06-11 DIAGNOSIS — Z0000 Encounter for general adult medical examination without abnormal findings: Secondary | ICD-10-CM

## 2020-06-11 DIAGNOSIS — Z23 Encounter for immunization: Secondary | ICD-10-CM

## 2020-06-11 DIAGNOSIS — I7121 Aneurysm of the ascending aorta, without rupture: Secondary | ICD-10-CM

## 2020-06-11 MED ORDER — PREVNAR 13 IM SUSP: 0.5000 mL | INTRAMUSCULAR | 0 refills | Status: AC

## 2020-06-11 MED ORDER — BOOSTRIX 5-2.5-18.5 LF-MCG/0.5 IM SUSP: 0.5000 mL | Freq: Once | INTRAMUSCULAR | 0 refills | Status: AC

## 2020-06-11 NOTE — Progress Notes (Signed)
I,Yamilka Roman [employer] as a Education administrator for [employer], FNP.,have documented all relevant documentation on the behalf of [name], FNP,as directed by  [name], FNP while in the presence of [name], [place]. This visit occurred during the SARS-CoV-2 public health emergency.  Safety protocols were in place, including screening questions prior to the visit, additional usage of staff PPE, and extensive cleaning of exam room while observing appropriate contact time as indicated for disinfecting solutions.  Subjective:     Patient ID: Edward [name] , male    DOB: 08-Jul-1951 , 69 y.o.   MRN: [id number]   Chief Complaint  Patient presents with   Hyperlipidemia    HPI  Here for cholesterol follow up.  He is currently not taking any medications.  He is exercising regularly 2 times a week and is working a job where he walks a lot.  He has seen a urologist and dermatologist (he was being treated for a rash on his left leg)   After taking a 7 hour flight to [place] he had a stroke - 2016 He has an appt with Dr. Golden Hurter - next week who is following the aorta CT  He took atorvastatin and it caused him to have muscle weakness.     Past Medical History:  Diagnosis Date   Abnormal EKG 05/29/2018   Sinus bradycardia and LAFB with TWI V4-V6, III and aVF   Benign essential HTN 05/29/2018   Environmental allergies    Fracture, ribs    H/O: varicose veins    Hearing loss    Multiple food allergies    Rash    Seasonal allergies    Sinusitis    Stroke [facility])    s/p tpa     Family History  Problem Relation Age of Onset   CVA Mother    Heart failure Father        CHF   Brain cancer Brother        GBM     Current Outpatient Medications:    aspirin EC 81 MG tablet, Take 81 mg by mouth daily. Swallow whole., Disp: , Rfl:    metoprolol tartrate (LOPRESSOR) 100 MG tablet, Take 1 tablet (100 mg total) 2 hours prior to CT scan if heart rate greater than 55 bpm., Disp:  1 tablet, Rfl: 0   sacubitril-valsartan (ENTRESTO) 24-26 MG, Take 1 tablet by mouth 2 (two) times daily., Disp: 60 tablet, Rfl: 3   sildenafil (VIAGRA) 100 MG tablet, Take 1 tablet by mouth as needed. , Disp: , Rfl:    spironolactone (ALDACTONE) 25 MG tablet, Take 0.5 tablets (12.5 mg total) by mouth daily., Disp: 45 tablet, Rfl: 3   No Known Allergies   Review of Systems  Respiratory: Negative.   Cardiovascular: Negative.   Gastrointestinal: Negative.   Genitourinary: Negative for difficulty urinating, dysuria, flank pain, frequency, hematuria and urgency.  Neurological: Negative for dizziness and headaches.  Psychiatric/Behavioral: Negative.      Today's Vitals   06/11/20 1046  BP: 112/78  Pulse: 67  Temp: 98.7 F (37.1 C)  TempSrc: Oral  Weight: 194 lb 12.8 oz (88.4 kg)  Height: 5' 10.2" (1.783 m)  PainSc: 0-No pain   Body mass index is 27.79 kg/m.   Objective:  Physical Exam Constitutional:      General: He is not in acute distress.    Appearance: Normal appearance.  Cardiovascular:     Rate and Rhythm: Normal rate and regular rhythm.     Pulses:  Normal pulses.     Heart sounds: Normal heart sounds. No murmur heard.   Pulmonary:     Effort: Pulmonary effort is normal. No respiratory distress.     Breath sounds: Normal breath sounds.  Abdominal:     General: Bowel sounds are normal. There is no distension.     Palpations: Abdomen is soft.     Tenderness: There is no abdominal tenderness.  Neurological:     General: No focal deficit present.     Mental Status: He is alert and oriented to person, place, and time.     Cranial Nerves: No cranial nerve deficit.  Psychiatric:        Mood and Affect: Mood normal.        Behavior: Behavior normal.        Thought Content: Thought content normal.        Judgment: Judgment normal.         Assessment And Plan:     1. Mixed hyperlipidemia  Chronic, controlled  Continue with current medications - Lipid  panel  2. Ascending aortic aneurysm [facility]) He is to follow up with cardiology continue with current medications  3. Need for influenza vaccination  Influenza vaccine administered  Encouraged to take Tylenol as needed for fever or muscle aches. - Flu Vaccine QUAD High Dose(Fluad)     Patient was given opportunity to ask questions. Patient verbalized understanding of the plan and was able to repeat key elements of the plan. All questions were answered to their satisfaction.  [name], FNP   I, [name], FNP, have reviewed all documentation for this visit. The documentation on 08/28/20 for the exam, diagnosis, procedures, and orders are all accurate and complete.  THE PATIENT IS ENCOURAGED TO PRACTICE SOCIAL DISTANCING DUE TO THE COVID-19 PANDEMIC.

## 2020-06-11 NOTE — Patient Instructions (Signed)
[name] , Thank you for taking time to come for your Medicare Wellness Visit. I appreciate your ongoing commitment to your health goals. Please review the following plan we discussed and let me know if I can assist you in the future.   Screening recommendations/referrals: Colonoscopy: patient to schedule Recommended yearly ophthalmology/optometry visit for glaucoma screening and checkup Recommended yearly dental visit for hygiene and checkup  Vaccinations: Influenza vaccine: today Pneumococcal vaccine: sent to pharmacy Tdap vaccine: sent to pharmacy Shingles vaccine: discussed   Covid-19: 12/29/2019  Advanced directives: Advance directive discussed with you today. Even though you declined this today please call our office should you change your mind and we can give you the proper paperwork for you to fill out.  Conditions/risks identified: none  Next appointment: 07/14/2020 at 3:00 Follow up in one year for your annual wellness visit.   Preventive Care 67 Years and Older, Male Preventive care refers to lifestyle choices and visits with your health care provider that can promote health and wellness. What does preventive care include?  A yearly physical exam. This is also called an annual well check.  Dental exams once or twice a year.  Routine eye exams. Ask your health care provider how often you should have your eyes checked.  Personal lifestyle choices, including:  Daily care of your teeth and gums.  Regular physical activity.  Eating a healthy diet.  Avoiding tobacco and drug use.  Limiting alcohol use.  Practicing safe sex.  Taking low doses of aspirin every day.  Taking vitamin and mineral supplements as recommended by your health care provider. What happens during an annual well check? The services and screenings done by your health care provider during your annual well check will depend on your age, overall health, lifestyle risk factors, and family history of  disease. Counseling  Your health care provider may ask you questions about your:  Alcohol use.  Tobacco use.  Drug use.  Emotional well-being.  Home and relationship well-being.  Sexual activity.  Eating habits.  History of falls.  Memory and ability to understand (cognition).  Work and work Statistician. Screening  You may have the following tests or measurements:  Height, weight, and BMI.  Blood pressure.  Lipid and cholesterol levels. These may be checked every 5 years, or more frequently if you are over 65 years old.  Skin check.  Lung cancer screening. You may have this screening every year starting at age 67 if you have a 30-pack-year history of smoking and currently smoke or have quit within the past 15 years.  Fecal occult blood test (FOBT) of the stool. You may have this test every year starting at age 67.  Flexible sigmoidoscopy or colonoscopy. You may have a sigmoidoscopy every 5 years or a colonoscopy every 10 years starting at age 67.  Prostate cancer screening. Recommendations will vary depending on your family history and other risks.  Hepatitis C blood test.  Hepatitis B blood test.  Sexually transmitted disease (STD) testing.  Diabetes screening. This is done by checking your blood sugar (glucose) after you have not eaten for a while (fasting). You may have this done every 1-3 years.  Abdominal aortic aneurysm (AAA) screening. You may need this if you are a current or former smoker.  Osteoporosis. You may be screened starting at age 38 if you are at high risk. Talk with your health care provider about your test results, treatment options, and if necessary, the need for more tests. Vaccines  Your health care provider may recommend certain vaccines, such as:  Influenza vaccine. This is recommended every year.  Tetanus, diphtheria, and acellular pertussis (Tdap, Td) vaccine. You may need a Td booster every 10 years.  Zoster vaccine. You may  need this after age 54.  Pneumococcal 13-valent conjugate (PCV13) vaccine. One dose is recommended after age 45.  Pneumococcal polysaccharide (PPSV23) vaccine. One dose is recommended after age 101. Talk to your health care provider about which screenings and vaccines you need and how often you need them. This information is not intended to replace advice given to you by your health care provider. Make sure you discuss any questions you have with your health care provider. Document Released: 09/12/2015 Document Revised: 05/05/2016 Document Reviewed: 06/17/2015 Elsevier Interactive Patient Education  2017 [place] Prevention in the Home Falls can cause injuries. They can happen to people of all ages. There are many things you can do to make your home safe and to help prevent falls. What can I do on the outside of my home?  Regularly fix the edges of walkways and driveways and fix any cracks.  Remove anything that might make you trip as you walk through a door, such as a raised step or threshold.  Trim any bushes or trees on the path to your home.  Use bright outdoor lighting.  Clear any walking paths of anything that might make someone trip, such as rocks or tools.  Regularly check to see if handrails are loose or broken. Make sure that both sides of any steps have handrails.  Any raised decks and porches should have guardrails on the edges.  Have any leaves, snow, or ice cleared regularly.  Use sand or salt on walking paths during winter.  Clean up any spills in your garage right away. This includes oil or grease spills. What can I do in the bathroom?  Use night lights.  Install grab bars by the toilet and in the tub and shower. Do not use towel bars as grab bars.  Use non-skid mats or decals in the tub or shower.  If you need to sit down in the shower, use a plastic, non-slip stool.  Keep the floor dry. Clean up any water that spills on the floor as soon as it  happens.  Remove soap buildup in the tub or shower regularly.  Attach bath mats securely with double-sided non-slip rug tape.  Do not have throw rugs and other things on the floor that can make you trip. What can I do in the bedroom?  Use night lights.  Make sure that you have a light by your bed that is easy to reach.  Do not use any sheets or blankets that are too big for your bed. They should not hang down onto the floor.  Have a firm chair that has side arms. You can use this for support while you get dressed.  Do not have throw rugs and other things on the floor that can make you trip. What can I do in the kitchen?  Clean up any spills right away.  Avoid walking on wet floors.  Keep items that you use a lot in easy-to-reach places.  If you need to reach something above you, use a strong step stool that has a grab bar.  Keep electrical cords out of the way.  Do not use floor polish or wax that makes floors slippery. If you must use wax, use non-skid floor wax.  Do  not have throw rugs and other things on the floor that can make you trip. What can I do with my stairs?  Do not leave any items on the stairs.  Make sure that there are handrails on both sides of the stairs and use them. Fix handrails that are broken or loose. Make sure that handrails are as long as the stairways.  Check any carpeting to make sure that it is firmly attached to the stairs. Fix any carpet that is loose or worn.  Avoid having throw rugs at the top or bottom of the stairs. If you do have throw rugs, attach them to the floor with carpet tape.  Make sure that you have a light switch at the top of the stairs and the bottom of the stairs. If you do not have them, ask someone to add them for you. What else can I do to help prevent falls?  Wear shoes that:  Do not have high heels.  Have rubber bottoms.  Are comfortable and fit you well.  Are closed at the toe. Do not wear sandals.  If you  use a stepladder:  Make sure that it is fully opened. Do not climb a closed stepladder.  Make sure that both sides of the stepladder are locked into place.  Ask someone to hold it for you, if possible.  Clearly mark and make sure that you can see:  Any grab bars or handrails.  First and last steps.  Where the edge of each step is.  Use tools that help you move around (mobility aids) if they are needed. These include:  Canes.  Walkers.  Scooters.  Crutches.  Turn on the lights when you go into a dark area. Replace any light bulbs as soon as they burn out.  Set up your furniture so you have a clear path. Avoid moving your furniture around.  If any of your floors are uneven, fix them.  If there are any pets around you, be aware of where they are.  Review your medicines with your doctor. Some medicines can make you feel dizzy. This can increase your chance of falling. Ask your doctor what other things that you can do to help prevent falls. This information is not intended to replace advice given to you by your health care provider. Make sure you discuss any questions you have with your health care provider. Document Released: 06/12/2009 Document Revised: 01/22/2016 Document Reviewed: 09/20/2014 Elsevier Interactive Patient Education  2017 [employer].

## 2020-06-11 NOTE — Progress Notes (Signed)
This visit occurred during the SARS-CoV-2 public health emergency.  Safety protocols were in place, including screening questions prior to the visit, additional usage of staff PPE, and extensive cleaning of exam room while observing appropriate contact time as indicated for disinfecting solutions.  Subjective:   Edward [name] is a 69 y.o. male who presents for Medicare Annual/Subsequent preventive examination.  Review of Systems     Cardiac Risk Factors include: advanced age (>25men, >32 women);male gender     Objective:    Today's Vitals   06/11/20 1101  BP: 112/78  Pulse: 67  Temp: 98.7 F (37.1 C)  TempSrc: Oral  Weight: 194 lb (88 kg)  Height: 5' 10.2" (1.783 m)   Body mass index is 27.68 kg/m.  Advanced Directives 06/11/2020 05/24/2019  Does Patient Have a Medical Advance Directive? No No  Would patient like information on creating a medical advance directive? No - Patient declined Yes (MAU/Ambulatory/Procedural Areas - Information given)    Current Medications (verified) Outpatient Encounter Medications as of 06/11/2020  Medication Sig  . atorvastatin (LIPITOR) 20 MG tablet Take 1 tablet (20 mg total) by mouth daily. For elevated LDL, needs labs in 6 weeks (Patient not taking: Reported on 12/27/2019)  . pneumococcal 13-valent conjugate vaccine (PREVNAR 13) SUSP injection Inject 0.5 mLs into the muscle tomorrow at 10 am for 1 dose.  . Tdap (BOOSTRIX) 5-2.5-18.5 LF-MCG/0.5 injection Inject 0.5 mLs into the muscle once for 1 dose.   No facility-administered encounter medications on file as of 06/11/2020.    Allergies (verified) Patient has no known allergies.   History: Past Medical History:  Diagnosis Date  . Abnormal EKG 05/29/2018   Sinus bradycardia and LAFB with TWI V4-V6, III and aVF  . Benign essential HTN 05/29/2018  . Environmental allergies   . Fracture, ribs   . H/O: varicose veins   . Hearing loss   . Multiple food allergies   . Rash   . Seasonal  allergies   . Sinusitis   . Stroke [facility])    s/p tpa   Past Surgical History:  Procedure Laterality Date  . INGUINAL HERNIA REPAIR Left   . right hand surgery     Family History  Problem Relation Age of Onset  . CVA Mother   . Heart failure Father        CHF  . Brain cancer Brother        GBM   Social History   Socioeconomic History  . Marital status: Married    Spouse name: Not on file  . Number of children: Not on file  . Years of education: Not on file  . Highest education level: Not on file  Occupational History  . 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 Smoking status: Former Research scientist (life sciences)  . Smokeless tobacco: Never Used  . Tobacco comment: quit 40+ years ago  Vaping Use  . Vaping Use: Never used  Substance and Sexual Activity  . Alcohol use: Yes    Comment: 2 times per week  . Drug use: Never  . Sexual activity: Yes  Other Topics Concern  . Not on file  Social History Narrative  . Not on file   Social Determinants of Health   Financial Resource Strain: Low Risk   . Difficulty of Paying Living Expenses: Not hard at all  Food Insecurity: No Food Insecurity  . Worried About Charity fundraiser in the Last Year: Never true  . Ran  Out of Food in the Last Year: Never true  Transportation Needs: No Transportation Needs  . Lack of Transportation (Medical): No  . Lack of Transportation (Non-Medical): No  Physical Activity: Inactive  . Days of Exercise per Week: 0 days  . Minutes of Exercise per Session: 0 min  Stress: No Stress Concern Present  . Feeling of Stress : Not at all  Social Connections:   . Frequency of Communication with Friends and Family: Not on file  . Frequency of Social Gatherings with Friends and Family: Not on file  . Attends Religious Services: Not on file  . Active Member of Clubs or Organizations: Not on file  . Attends Archivist Meetings: Not on file  . Marital Status: Not on file    Tobacco  Counseling Counseling given: Not Answered Comment: quit 40+ years ago   Clinical Intake:  Pre-visit preparation completed: Yes  Pain : No/denies pain     Nutritional Status: BMI 25 -29 Overweight Nutritional Risks: None Diabetes: No  How often do you need to have someone help you when you read instructions, pamphlets, or other written materials from your doctor or pharmacy?: 1 - Never What is the last grade level you completed in school?: college  Diabetic? no  Interpreter Needed?: No  Information entered by :: NAllen LPN   Activities of Daily Living In your present state of health, do you have any difficulty performing the following activities: 06/11/2020 06/11/2020  Hearing? N N  Vision? N N  Difficulty concentrating or making decisions? N N  Walking or climbing stairs? N N  Dressing or bathing? N N  Doing errands, shopping? N N  Preparing Food and eating ? N -  Using the Toilet? N -  In the past six months, have you accidently leaked urine? N -  Do you have problems with loss of bowel control? N -  Managing your Medications? N -  Managing your Finances? N -  Housekeeping or managing your Housekeeping? N -  Some recent data might be hidden    Patient Care Team: Glendale Chard, MD as PCP - General (Internal Medicine) Tat, Eustace Quail, DO as Consulting Physician (Neurology)  Indicate any recent Medical Services you may have received from other than [facility] providers in the past year (date may be approximate).     Assessment:   This is a routine wellness examination for Edward [name].  Hearing/Vision screen  Hearing Screening   125Hz  250Hz  500Hz  1000Hz  2000Hz  3000Hz  4000Hz  6000Hz  8000Hz   Right ear:           Left ear:           Vision Screening Comments: Regular eye exams, [facility]  Dietary issues and exercise activities discussed: Current Exercise Habits: The patient has a physically strenuous job, but has no regular exercise apart from work.  Goals    .  Patient Stated     05/24/2019, wants to get back to the gym    . Patient Stated     06/11/2020, wants to get cholesterol in line      Depression Screen PHQ 2/9 Scores 06/11/2020 09/26/2019 05/24/2019 09/21/2018  PHQ - 2 Score 0 0 0 0  PHQ- 9 Score - - 0 -    Fall Risk Fall Risk  06/11/2020 09/26/2019 07/31/2019 05/24/2019 05/24/2019  Falls in the past year? 0 0 0 0 0  Number falls in past yr: - - 0 0 -  Injury with Fall? - - 0 - -  Risk for fall due to : No Fall Risks - - - -  Follow up Falls evaluation completed;Education provided;Falls prevention discussed - - Falls evaluation completed;Education provided;Falls prevention discussed -    Any stairs in or around the home? Yes  If so, are there any without handrails? No  Home free of loose throw rugs in walkways, pet beds, electrical cords, etc? Yes  Adequate lighting in your home to reduce risk of falls? Yes   ASSISTIVE DEVICES UTILIZED TO PREVENT FALLS:  Life alert? No  Use of a cane, walker or w/c? No  Grab bars in the bathroom? No  Shower chair or bench in shower? No  Elevated toilet seat or a handicapped toilet? No   TIMED UP AND GO:  Was the test performed? No .   Gait steady and fast without use of assistive device  Cognitive Function:     6CIT Screen 06/11/2020 05/24/2019  What Year? 0 points 0 points  What month? 0 points 0 points  What time? 0 points 0 points  Count back from 20 0 points 0 points  Months in reverse 0 points 0 points  Repeat phrase 0 points 0 points  Total Score 0 0    Immunizations Immunization History  Administered Date(s) Administered  . Fluad Quad(high Dose 65+) 06/11/2020  . Janssen (J&J) SARS-COV-2 Vaccination 12/29/2019    TDAP status: Due, Education has been provided regarding the importance of this vaccine. Advised may receive this vaccine at local pharmacy or Health Dept. Aware to provide a copy of the vaccination record if obtained from local pharmacy or Health Dept. Verbalized  acceptance and understanding. Flu Vaccine status: Completed at today's visit Pneumococcal vaccine status: Up to date Covid-19 vaccine status: Completed vaccines  Qualifies for Shingles Vaccine? Yes   Zostavax completed No   Shingrix Completed?: No.    Education has been provided regarding the importance of this vaccine. Patient has been advised to call insurance company to determine out of pocket expense if they have not yet received this vaccine. Advised may also receive vaccine at local pharmacy or Health Dept. Verbalized acceptance and understanding.  Screening Tests Health Maintenance  Topic Date Due  . TETANUS/TDAP  Never done  . COLONOSCOPY  Never done  . PNA vac Low Risk Adult (1 of 2 - PCV13) Never done  . INFLUENZA VACCINE  Completed  . COVID-19 Vaccine  Completed  . Hepatitis C Screening  Completed    Health Maintenance  Health Maintenance Due  Topic Date Due  . TETANUS/TDAP  Never done  . COLONOSCOPY  Never done  . PNA vac Low Risk Adult (1 of 2 - PCV13) Never done    Colorectal cancer screening: Completed 2011. Repeat every 10 years  Lung Cancer Screening: (Low Dose CT Chest recommended if Age 64-80 years, 30 pack-year currently smoking OR have quit w/in 15years.) does not qualify.   Lung Cancer Screening Referral: no  Additional Screening:  Hepatitis C Screening: does qualify; Completed 05/24/2019  Vision Screening: Recommended annual ophthalmology exams for early detection of glaucoma and other disorders of the eye. Is the patient up to date with their annual eye exam?  Yes  Who is the provider or what is the name of the office in which the patient attends annual eye exams? [facility] If pt is not established with a provider, would they like to be referred to a provider to establish care? No .   Dental Screening: Recommended annual dental exams for proper  oral hygiene  Community Resource Referral / Chronic Care Management: CRR required this visit?  No    CCM required this visit?  No      Plan:     I have personally reviewed and noted the following in the patient's chart:   . Medical and social history . Use of alcohol, tobacco or illicit drugs  . Current medications and supplements . Functional ability and status . Nutritional status . Physical activity . Advanced directives . List of other physicians . Hospitalizations, surgeries, and ER visits in previous 12 months . Vitals . Screenings to include cognitive, depression, and falls . Referrals and appointments  In addition, I have reviewed and discussed with patient certain preventive protocols, quality metrics, and best practice recommendations. A written personalized care plan for preventive services as well as general preventive health recommendations were provided to patient.     Kellie Simmering, LPN   [id number]/93/[id number]   Nurse Notes:

## 2020-06-12 LAB — LIPID PANEL
Chol/HDL Ratio: 4.3 ratio (ref 0.0–5.0)
Cholesterol, Total: 189 mg/dL (ref 100–199)
HDL: 44 mg/dL (ref >39)
LDL Chol Calc (NIH): 127 mg/dL — ABNORMAL HIGH (ref 0–99)
Triglycerides: 99 mg/dL (ref 0–149)
VLDL Cholesterol Cal: 18 mg/dL (ref 5–40)

## 2020-06-19 ENCOUNTER — Ambulatory Visit: Payer: Medicare HMO | Admitting: Cardiology

## 2020-06-19 ENCOUNTER — Encounter: Payer: Self-pay | Admitting: Cardiology

## 2020-06-19 ENCOUNTER — Other Ambulatory Visit: Payer: Self-pay

## 2020-06-19 VITALS — BP 132/104 | HR 87 | Ht 72.0 in | Wt 194.0 lb

## 2020-06-19 DIAGNOSIS — I10 Essential (primary) hypertension: Secondary | ICD-10-CM

## 2020-06-19 DIAGNOSIS — I712 Thoracic aortic aneurysm, without rupture: Secondary | ICD-10-CM

## 2020-06-19 DIAGNOSIS — I77819 Aortic ectasia, unspecified site: Secondary | ICD-10-CM | POA: Diagnosis not present

## 2020-06-19 DIAGNOSIS — E7800 Pure hypercholesterolemia, unspecified: Secondary | ICD-10-CM | POA: Diagnosis not present

## 2020-06-19 DIAGNOSIS — R9431 Abnormal electrocardiogram [ECG] [EKG]: Secondary | ICD-10-CM

## 2020-06-19 DIAGNOSIS — I77810 Thoracic aortic ectasia: Secondary | ICD-10-CM | POA: Diagnosis not present

## 2020-06-19 DIAGNOSIS — I7121 Aneurysm of the ascending aorta, without rupture: Secondary | ICD-10-CM

## 2020-06-19 NOTE — Patient Instructions (Signed)
Medication Instructions:  Your physician recommends that you continue on your current medications as directed. Please refer to the Current Medication list given to you today.  *If you need a refill on your cardiac medications before your next appointment, please call your pharmacy*   Lab Work: None If you have labs (blood work) drawn today and your tests are completely normal, you will receive your results only by: MyChart Message (if you have MyChart) OR A paper copy in the mail If you have any lab test that is abnormal or we need to change your treatment, we will call you to review the results.   Testing/Procedures: Your physician has requested that you have an echocardiogram. Echocardiography is a painless test that uses sound waves to create images of your heart. It provides your doctor with information about the size and shape of your heart and how well your heart's chambers and valves are working. This procedure takes approximately one hour. There are no restrictions for this procedure.   Follow-Up: At [facility], you and your health needs are our priority.  As part of our continuing mission to provide you with exceptional heart care, we have created designated Provider Care Teams.  These Care Teams include your primary Cardiologist (physician) and Advanced Practice Providers (APPs -  Physician Assistants and Nurse Practitioners) who all work together to provide you with the care you need, when you need it.  Your next appointment:   1 year(s)  The format for your next appointment:   In Person  Provider:   Traci Turner, MD {   

## 2020-06-25 DIAGNOSIS — L309 Dermatitis, unspecified: Secondary | ICD-10-CM | POA: Diagnosis not present

## 2020-06-25 DIAGNOSIS — L570 Actinic keratosis: Secondary | ICD-10-CM | POA: Diagnosis not present

## 2020-06-29 DIAGNOSIS — R69 Illness, unspecified: Secondary | ICD-10-CM | POA: Diagnosis not present

## 2020-06-30 ENCOUNTER — Encounter: Payer: Self-pay | Admitting: Nurse Practitioner

## 2020-06-30 DIAGNOSIS — N402 Nodular prostate without lower urinary tract symptoms: Secondary | ICD-10-CM | POA: Diagnosis not present

## 2020-07-14 ENCOUNTER — Ambulatory Visit (INDEPENDENT_AMBULATORY_CARE_PROVIDER_SITE_OTHER): Payer: Medicare HMO | Admitting: Nurse Practitioner

## 2020-07-14 ENCOUNTER — Other Ambulatory Visit: Payer: Self-pay

## 2020-07-14 ENCOUNTER — Encounter: Payer: Self-pay | Admitting: Nurse Practitioner

## 2020-07-14 VITALS — BP 116/76 | HR 71 | Temp 98.5°F | Ht 70.8 in | Wt 196.8 lb

## 2020-07-14 DIAGNOSIS — I83893 Varicose veins of bilateral lower extremities with other complications: Secondary | ICD-10-CM | POA: Diagnosis not present

## 2020-07-14 DIAGNOSIS — Z0000 Encounter for general adult medical examination without abnormal findings: Secondary | ICD-10-CM | POA: Diagnosis not present

## 2020-07-14 DIAGNOSIS — Z23 Encounter for immunization: Secondary | ICD-10-CM

## 2020-07-14 DIAGNOSIS — E782 Mixed hyperlipidemia: Secondary | ICD-10-CM | POA: Diagnosis not present

## 2020-07-14 DIAGNOSIS — I10 Essential (primary) hypertension: Secondary | ICD-10-CM

## 2020-07-14 DIAGNOSIS — R21 Rash and other nonspecific skin eruption: Secondary | ICD-10-CM | POA: Diagnosis not present

## 2020-07-14 DIAGNOSIS — Z1211 Encounter for screening for malignant neoplasm of colon: Secondary | ICD-10-CM | POA: Diagnosis not present

## 2020-07-14 LAB — POCT URINALYSIS DIPSTICK
Bilirubin, UA: NEGATIVE
Blood, UA: NEGATIVE
Glucose, UA: NEGATIVE
Ketones, UA: NEGATIVE
Leukocytes, UA: NEGATIVE
Nitrite, UA: NEGATIVE
Protein, UA: NEGATIVE
Spec Grav, UA: 1.015 (ref 1.010–1.025)
Urobilinogen, UA: 0.2 E.U./dL
pH, UA: 5.5 (ref 5.0–8.0)

## 2020-07-14 LAB — POCT UA - MICROALBUMIN
Albumin/Creatinine Ratio, Urine, POC: <30
Creatinine, POC: 50 mg/dL
Microalbumin Ur, POC: 10 mg/L

## 2020-07-14 MED ORDER — TETANUS-DIPHTH-ACELL PERTUSSIS 5-2.5-18.5 LF-MCG/0.5 IM SUSP: 0.5000 mL | Freq: Once | INTRAMUSCULAR | 0 refills | Status: AC

## 2020-07-14 NOTE — Progress Notes (Signed)
I,Yamilka Roman [employer] as a Education administrator for [employer], FNP.,have documented all relevant documentation on the behalf of [name], FNP,as directed by  [name], FNP while in the presence of [name], [place]. This visit occurred during the SARS-CoV-2 public health emergency.  Safety protocols were in place, including screening questions prior to the visit, additional usage of staff PPE, and extensive cleaning of exam room while observing appropriate contact time as indicated for disinfecting solutions.  Subjective:     Patient ID: Edward [name] , male    DOB: June 19, 1951 , 69 y.o.   MRN: [id number]   Chief Complaint  Patient presents with  . Annual Exam    HPI  Here for HM.  He has been to the Urologist and had PSA and had a prostate exam.    He has been to the dermatologist, but his skin is always itching when wet or dry.      Past Medical History:  Diagnosis Date  . Abnormal EKG 05/29/2018   Sinus bradycardia and LAFB with TWI V4-V6, III and aVF  . Benign essential HTN 05/29/2018  . Environmental allergies   . Fracture, ribs   . H/O: varicose veins   . Hearing loss   . Multiple food allergies   . Rash   . Seasonal allergies   . Sinusitis   . Stroke [facility])    s/p tpa     Family History  Problem Relation Age of Onset  . CVA Mother   . Heart failure Father        CHF  . Brain cancer Brother        GBM     Current Outpatient Medications:  .  aspirin EC 81 MG tablet, Take 81 mg by mouth daily. Swallow whole., Disp: , Rfl:  .  sildenafil (VIAGRA) 100 MG tablet, Take 1 tablet by mouth as needed. , Disp: , Rfl:    No Known Allergies   Men's preventive visit. Patient Health Questionnaire (PHQ-2) is    Clinical Support from 06/11/2020 in [facility]  PHQ-2 Total Score 0     Patient is on a regular diet, tries to avoid increased sugar and fat. Exercising - daily at work with walking and will go to the gym 2 times a week.  Marital status:  Married. Relevant history for alcohol use is:  Social History   Substance and Sexual Activity  Alcohol Use Yes   Comment: 2 times per week  . Relevant history for tobacco use is:  Social History   Tobacco Use  Smoking Status Former Smoker  Smokeless Tobacco Never Used  Tobacco Comment   quit 40+ years ago  .   Review of Systems  Constitutional: Negative.   HENT: Negative.   Eyes: Negative.   Respiratory: Negative.   Cardiovascular: Negative.   Gastrointestinal: Negative.   Endocrine: Negative.   Genitourinary: Negative.   Musculoskeletal: Negative.        Right lateral foot pain.  Occasionally will have to limp with it.   Skin: Negative.        Persistent itching  Neurological: Negative.   Hematological: Negative.   Psychiatric/Behavioral: Negative.      Today's Vitals   07/14/20 1512  BP: 116/76  Pulse: 71  Temp: 98.5 F (36.9 C)  TempSrc: Oral  Weight: 196 lb 12.8 oz (89.3 kg)  Height: 5' 10.8" (1.798 m)  PainSc: 1   PainLoc: Foot   Body mass index is 27.6 kg/m.  Objective:  Physical Exam Vitals reviewed.  Constitutional:      General: He is not in acute distress.    Appearance: Normal appearance.  HENT:     Head: Normocephalic and atraumatic.     Right Ear: Tympanic membrane, ear canal and external ear normal. There is no impacted cerumen.     Left Ear: Tympanic membrane, ear canal and external ear normal. There is no impacted cerumen.     Nose:     Comments: Deferred - masked    Mouth/Throat:     Comments: Deferred - masked Eyes:     Pupils: Pupils are equal, round, and reactive to light.  Cardiovascular:     Rate and Rhythm: Normal rate and regular rhythm.     Pulses: Normal pulses.     Heart sounds: Normal heart sounds. No murmur heard.   Pulmonary:     Effort: Pulmonary effort is normal. No respiratory distress.     Breath sounds: Normal breath sounds.  Abdominal:     General: Abdomen is flat. Bowel sounds are normal. There is no  distension.     Palpations: Abdomen is soft.  Genitourinary:    Comments: Deferred - seen by urology Musculoskeletal:        General: No swelling or tenderness. Normal range of motion.     Cervical back: Normal range of motion and neck supple.  Skin:    General: Skin is warm.     Capillary Refill: Capillary refill takes less than 2 seconds.     Findings: Rash (left lower extremity) present.  Neurological:     General: No focal deficit present.     Mental Status: He is alert and oriented to person, place, and time.  Psychiatric:        Mood and Affect: Mood normal.        Behavior: Behavior normal.        Thought Content: Thought content normal.        Judgment: Judgment normal.         Assessment And Plan:    1. Encounter for annual physical exam . Behavior modifications discussed and diet history reviewed.   . Pt will continue to exercise regularly and modify diet with low GI, plant based foods and decrease intake of processed foods.  . Recommend intake of daily multivitamin, Vitamin D, and calcium.  . Recommend colonoscopy for preventive screenings, as well as recommend immunizations that include influenza, TDAP (up to date) - CBC  2. Benign essential HTN  Chronic, well controlled, diet and exercise controlled  No current medications  Will check kidney functions - CMP14+EGFR - POCT Urinalysis Dipstick (81002) - POCT UA - Microalbumin  3. Mixed hyperlipidemia  Chronic, controlled  No current medications  4. Encounter for immunization  Sent to pharmacy TDAP will be administered to adults 27-29 years old every 10 years. - Tdap (BOOSTRIX) 5-2.5-18.5 LF-MCG/0.5 injection; Inject 0.5 mLs into the muscle once for 1 dose.  Dispense: 0.5 mL; Refill: 0  5. Encounter for screening colonoscopy  According to USPTF Colorectal cancer Screening guidelines. Colonoscopy is recommended every 10 years, starting at age 60years.  Will refer to GI for colon cancer screening. -  Ambulatory referral to Gastroenterology  6. Varicose veins of bilateral lower extremities with other complications  He has multiple bulging veins to bilateral lower extremities  Will refer to vascular - Ambulatory referral to Vascular Surgery  7. Rash and nonspecific skin eruption  Complains of persistent rash to left  lower extremity, I am not sure if this is related to poor circulation  Will refer to vascular - Ambulatory referral to Vascular Surgery     Patient was given opportunity to ask questions. Patient verbalized understanding of the plan and was able to repeat key elements of the plan. All questions were answered to their satisfaction.    Teola Bradley, FNP, have reviewed all documentation for this visit. The documentation on 07/27/20 for the exam, diagnosis, procedures, and orders are all accurate and complete.  THE PATIENT IS ENCOURAGED TO PRACTICE SOCIAL DISTANCING DUE TO THE COVID-19 PANDEMIC.

## 2020-07-14 NOTE — Patient Instructions (Addendum)
Health Maintenance, Male Adopting a healthy lifestyle and getting preventive care are important in promoting health and wellness. Ask your health care provider about:  The right schedule for you to have regular tests and exams.  Things you can do on your own to prevent diseases and keep yourself healthy. What should I know about diet, weight, and exercise? Eat a healthy diet   Eat a diet that includes plenty of vegetables, fruits, low-fat dairy products, and lean protein.  Do not eat a lot of foods that are high in solid fats, added sugars, or sodium. Maintain a healthy weight Body mass index (BMI) is a measurement that can be used to identify possible weight problems. It estimates body fat based on height and weight. Your health care provider can help determine your BMI and help you achieve or maintain a healthy weight. Get regular exercise Get regular exercise. This is one of the most important things you can do for your health. Most adults should:  Exercise for at least 150 minutes each week. The exercise should increase your heart rate and make you sweat (moderate-intensity exercise).  Do strengthening exercises at least twice a week. This is in addition to the moderate-intensity exercise.  Spend less time sitting. Even light physical activity can be beneficial. Watch cholesterol and blood lipids Have your blood tested for lipids and cholesterol at 67 years of age, then have this test every 5 years. You may need to have your cholesterol levels checked more often if:  Your lipid or cholesterol levels are high.  You are older than 67 years of age.  You are at high risk for heart disease. What should I know about cancer screening? Many types of cancers can be detected early and may often be prevented. Depending on your health history and family history, you may need to have cancer screening at various ages. This may include screening for:  Colorectal cancer.  Prostate  cancer.  Skin cancer.  Lung cancer. What should I know about heart disease, diabetes, and high blood pressure? Blood pressure and heart disease  High blood pressure causes heart disease and increases the risk of stroke. This is more likely to develop in people who have high blood pressure readings, are of African descent, or are overweight.  Talk with your health care provider about your target blood pressure readings.  Have your blood pressure checked: ? Every 3-5 years if you are 18-39 years of age. ? Every year if you are 40 years old or older.  If you are between the ages of 65 and 75 and are a current or former smoker, ask your health care provider if you should have a one-time screening for abdominal aortic aneurysm (AAA). Diabetes Have regular diabetes screenings. This checks your fasting blood sugar level. Have the screening done:  Once every three years after age 45 if you are at a normal weight and have a low risk for diabetes.  More often and at a younger age if you are overweight or have a high risk for diabetes. What should I know about preventing infection? Hepatitis B If you have a higher risk for hepatitis B, you should be screened for this virus. Talk with your health care provider to find out if you are at risk for hepatitis B infection. Hepatitis C Blood testing is recommended for:  Everyone born from 1945 through 1965.  Anyone with known risk factors for hepatitis C. Sexually transmitted infections (STIs)  You should be screened each year   for STIs, including gonorrhea and chlamydia, if: ? You are sexually active and are younger than 67 years of age. ? You are older than 67 years of age and your health care provider tells you that you are at risk for this type of infection. ? Your sexual activity has changed since you were last screened, and you are at increased risk for chlamydia or gonorrhea. Ask your health care provider if you are at risk.  Ask your  health care provider about whether you are at high risk for HIV. Your health care provider may recommend a prescription medicine to help prevent HIV infection. If you choose to take medicine to prevent HIV, you should first get tested for HIV. You should then be tested every 3 months for as long as you are taking the medicine. Follow these instructions at home: Lifestyle  Do not use any products that contain nicotine or tobacco, such as cigarettes, e-cigarettes, and chewing tobacco. If you need help quitting, ask your health care provider.  Do not use street drugs.  Do not share needles.  Ask your health care provider for help if you need support or information about quitting drugs. Alcohol use  Do not drink alcohol if your health care provider tells you not to drink.  If you drink alcohol: ? Limit how much you have to 0-2 drinks a day. ? Be aware of how much alcohol is in your drink. In the [place] one drink equals one 12 oz bottle of beer (355 mL), one 5 oz glass of wine (148 mL), or one 1 oz glass of hard liquor (44 mL). General instructions  Schedule regular health, dental, and eye exams.  Stay current with your vaccines.  Tell your health care provider if: ? You often feel depressed. ? You have ever been abused or do not feel safe at home. Summary  Adopting a healthy lifestyle and getting preventive care are important in promoting health and wellness.  Follow your health care provider's instructions about healthy diet, exercising, and getting tested or screened for diseases.  Follow your health care provider's instructions on monitoring your cholesterol and blood pressure. This information is not intended to replace advice given to you by your health care provider. Make sure you discuss any questions you have with your health care provider. Document Revised: 08/09/2018 Document Reviewed: 08/09/2018 Elsevier Patient Education  [place].   Varicose Veins Varicose  veins are veins that have become enlarged, bulged, and twisted. They most often appear in the legs. What are the causes? This condition is caused by damage to the valves in the vein. These valves help blood return to your heart. When they are damaged and they stop working properly, blood may flow backward and back up in the veins near the skin, causing the veins to get larger and appear twisted. The condition can result from any issue that causes blood to back up, like pregnancy, prolonged standing, or obesity. What increases the risk? This condition is more likely to develop in people who are:  On their feet a lot.  Pregnant.  Overweight. What are the signs or symptoms? Symptoms of this condition include:  Bulging, twisted, and bluish veins.  A feeling of heaviness. This may be worse at the end of the day.  Leg pain. This may be worse at the end of the day.  Swelling in the leg.  Changes in skin color over the veins. How is this diagnosed? This condition may be diagnosed based  on your symptoms, a physical exam, and an ultrasound test. How is this treated? Treatment for this condition may involve:  Avoiding sitting or standing in one position for long periods of time.  Wearing compression stockings. These stockings help to prevent blood clots and reduce swelling in the legs.  Raising (elevating) the legs when resting.  Losing weight.  Exercising regularly. If you have persistent symptoms or want to improve the way your varicose veins look, you may choose to have a procedure to close the varicose veins off or to remove them. Treatments to close off the veins include:  Sclerotherapy. In this treatment, a solution is injected into a vein to close it off.  Laser treatment. In this treatment, the vein is heated with a laser to close it off.  Radiofrequency vein ablation. In this treatment, an electrical current produced by radio waves is used to close off the vein. Treatments  to remove the veins include:  Phlebectomy. In this treatment, the veins are removed through small incisions made over the veins.  Vein ligation and stripping. In this treatment, incisions are made over the veins. The veins are then removed after being tied (ligated) with stitches (sutures). Follow these instructions at home: Activity  Walk as much as possible. Walking increases blood flow. This helps blood return to the heart and takes pressure off your veins. It also increases your cardiovascular strength.  Follow your health care provider's instructions about exercising.  Do not stand or sit in one position for a long period of time.  Do not sit with your legs crossed.  Rest with your legs raised during the day. General instructions   Follow any diet instructions given to you by your health care provider.  Wear compression stockings as directed by your health care provider. Do not wear other kinds of tight clothing around your legs, pelvis, or waist.  Elevate your legs at night to above the level of your heart.  If you get a cut in the skin over the varicose vein and the vein bleeds: ? Lie down with your leg raised. ? Apply firm pressure to the cut with a clean cloth until the bleeding stops. ? Place a bandage (dressing) on the cut. Contact a health care provider if:  The skin around your varicose veins starts to break down.  You have pain, redness, tenderness, or hard swelling over a vein.  You are uncomfortable because of pain.  You get a cut in the skin over a varicose vein and it will not stop bleeding. Summary  Varicose veins are veins that have become enlarged, bulged, and twisted. They most often appear in the legs.  This condition is caused by damage to the valves in the vein. These valves help blood return to your heart.  Treatment for this condition includes frequent movements, wearing compression stockings, losing weight, and exercising regularly. In some  cases, procedures are done to close off or remove the veins.  Treatment for this condition may include wearing compression stockings, elevating the legs, losing weight, and engaging in regular activity. In some cases, procedures are done to close off or remove the veins. This information is not intended to replace advice given to you by your health care provider. Make sure you discuss any questions you have with your health care provider. Document Revised: 10/12/2018 Document Reviewed: 09/08/2016 Elsevier Patient Education  [place].

## 2020-07-29 ENCOUNTER — Encounter: Payer: Self-pay | Admitting: Nurse Practitioner

## 2020-08-04 ENCOUNTER — Ambulatory Visit (HOSPITAL_COMMUNITY): Payer: Medicare HMO | Attending: Cardiology

## 2020-08-04 ENCOUNTER — Other Ambulatory Visit: Payer: Self-pay

## 2020-08-04 DIAGNOSIS — R9431 Abnormal electrocardiogram [ECG] [EKG]: Secondary | ICD-10-CM | POA: Insufficient documentation

## 2020-08-04 DIAGNOSIS — I77819 Aortic ectasia, unspecified site: Secondary | ICD-10-CM | POA: Diagnosis not present

## 2020-08-04 DIAGNOSIS — E7800 Pure hypercholesterolemia, unspecified: Secondary | ICD-10-CM | POA: Diagnosis not present

## 2020-08-04 DIAGNOSIS — I7121 Aneurysm of the ascending aorta, without rupture: Secondary | ICD-10-CM

## 2020-08-04 DIAGNOSIS — I10 Essential (primary) hypertension: Secondary | ICD-10-CM | POA: Insufficient documentation

## 2020-08-04 DIAGNOSIS — I77810 Thoracic aortic ectasia: Secondary | ICD-10-CM | POA: Diagnosis not present

## 2020-08-04 DIAGNOSIS — I712 Thoracic aortic aneurysm, without rupture: Secondary | ICD-10-CM | POA: Insufficient documentation

## 2020-08-04 LAB — ECHOCARDIOGRAM COMPLETE
Area-P 1/2: 3.03 cm2
S' Lateral: 3.8 cm

## 2020-08-05 ENCOUNTER — Telehealth: Payer: Self-pay

## 2020-08-05 DIAGNOSIS — I77810 Thoracic aortic ectasia: Secondary | ICD-10-CM

## 2020-08-05 DIAGNOSIS — I77819 Aortic ectasia, unspecified site: Secondary | ICD-10-CM

## 2020-08-05 DIAGNOSIS — R9431 Abnormal electrocardiogram [ECG] [EKG]: Secondary | ICD-10-CM

## 2020-08-05 DIAGNOSIS — E782 Mixed hyperlipidemia: Secondary | ICD-10-CM

## 2020-08-05 DIAGNOSIS — I712 Thoracic aortic aneurysm, without rupture: Secondary | ICD-10-CM

## 2020-08-05 DIAGNOSIS — I10 Essential (primary) hypertension: Secondary | ICD-10-CM

## 2020-08-05 DIAGNOSIS — I7121 Aneurysm of the ascending aorta, without rupture: Secondary | ICD-10-CM

## 2020-08-05 MED ORDER — ENTRESTO 24-26 MG PO TABS: 1.0000 | ORAL_TABLET | Freq: Two times a day (BID) | ORAL | 3 refills | Status: DC

## 2020-08-05 MED ORDER — SPIRONOLACTONE 25 MG PO TABS: 12.5000 mg | ORAL_TABLET | Freq: Every day | ORAL | 3 refills | Status: DC

## 2020-08-05 MED ORDER — METOPROLOL TARTRATE 100 MG PO TABS: ORAL_TABLET | ORAL | 0 refills | Status: DC

## 2020-08-05 NOTE — Telephone Encounter (Signed)
Spoke with the patient and reviewed results. Patient verbalized understanding.  Lab work has been ordered and scheduled.  Coronary CTA has been ordered.  FU with PA has been scheduled.  Will send MyChart message with instructions for CT scan.

## 2020-08-05 NOTE — Telephone Encounter (Signed)
-----   Message from Sueanne Margarita, MD sent at 08/05/2020  9:43 AM EST ----- Moderate LV dysfunction with EF 35%, increased stiffness of heart muscle, mildly reduced RVF and moderately dilated aortic root at 59mm (ascending aorta measured 15mm on CT a year ago).  Please get a coronary CTA to assess for CAD given reduced LVF as well as reassess ascending aorta. Likely his DCM is related to hypertension. Will need BMET prior to study.  Start Entrestion 24-26mg  BID and spironolactone 12.5mg  daily and followup with PA in 2 weeks for uptitration of Entresto and addition of carvedilol if BP allows.  BMET in 1 week.  Please have him come into lab for ferritin, TSH, HIV, serum and urine protein electrophoresis.

## 2020-08-12 NOTE — Addendum Note (Signed)
Addended by: Antonieta Iba on: 08/12/2020 11:45 AM   Modules accepted: Orders

## 2020-08-13 ENCOUNTER — Other Ambulatory Visit: Payer: Medicare HMO | Admitting: *Deleted

## 2020-08-13 ENCOUNTER — Other Ambulatory Visit: Payer: Self-pay

## 2020-08-13 DIAGNOSIS — I77819 Aortic ectasia, unspecified site: Secondary | ICD-10-CM

## 2020-08-13 DIAGNOSIS — I10 Essential (primary) hypertension: Secondary | ICD-10-CM

## 2020-08-13 DIAGNOSIS — I712 Thoracic aortic aneurysm, without rupture: Secondary | ICD-10-CM | POA: Diagnosis not present

## 2020-08-13 DIAGNOSIS — I77810 Thoracic aortic ectasia: Secondary | ICD-10-CM

## 2020-08-13 DIAGNOSIS — R9431 Abnormal electrocardiogram [ECG] [EKG]: Secondary | ICD-10-CM | POA: Diagnosis not present

## 2020-08-13 DIAGNOSIS — E782 Mixed hyperlipidemia: Secondary | ICD-10-CM | POA: Diagnosis not present

## 2020-08-13 DIAGNOSIS — E7800 Pure hypercholesterolemia, unspecified: Secondary | ICD-10-CM | POA: Diagnosis not present

## 2020-08-13 DIAGNOSIS — I7121 Aneurysm of the ascending aorta, without rupture: Secondary | ICD-10-CM

## 2020-08-14 LAB — BASIC METABOLIC PANEL
BUN/Creatinine Ratio: 13 (ref 10–24)
BUN: 15 mg/dL (ref 8–27)
CO2: 26 mmol/L (ref 20–29)
Calcium: 9 mg/dL (ref 8.6–10.2)
Chloride: 100 mmol/L (ref 96–106)
Creatinine, Ser: 1.13 mg/dL (ref 0.76–1.27)
GFR calc Af Amer: 77 mL/min/{1.73_m2} (ref >59)
GFR calc non Af Amer: 67 mL/min/{1.73_m2} (ref >59)
Glucose: 92 mg/dL (ref 65–99)
Potassium: 4.8 mmol/L (ref 3.5–5.2)
Sodium: 137 mmol/L (ref 134–144)

## 2020-08-14 LAB — FERRITIN: Ferritin: 81 ng/mL (ref 30–400)

## 2020-08-14 LAB — PROTEIN ELECTROPHORESIS, SERUM
A/G Ratio: 1.5 (ref 0.7–1.7)
Albumin ELP: 3.8 g/dL (ref 2.9–4.4)
Alpha 1: 0.2 g/dL (ref 0.0–0.4)
Alpha 2: 0.6 g/dL (ref 0.4–1.0)
Beta: 1 g/dL (ref 0.7–1.3)
Gamma Globulin: 0.9 g/dL (ref 0.4–1.8)
Globulin, Total: 2.6 g/dL (ref 2.2–3.9)
Total Protein: 6.4 g/dL (ref 6.0–8.5)

## 2020-08-14 LAB — ALT: ALT: 31 IU/L (ref 0–44)

## 2020-08-14 LAB — HIV ANTIBODY (ROUTINE TESTING W REFLEX): HIV Screen 4th Generation wRfx: NONREACTIVE

## 2020-08-14 LAB — TSH: TSH: 1.42 u[IU]/mL (ref 0.450–4.500)

## 2020-08-18 LAB — PROTEIN ELECTROPHORESIS,RANDOM URN
Albumin ELP, Urine: 0 %
Alpha-1-Globulin, U: 0 %
Alpha-2-Globulin, U: 0 %
Beta Globulin, U: 0 %
Creatinine, Urine: 36.1 mg/dL
Gamma Globulin, U: 0 %
Protein, Ur: 8.3 mg/dL

## 2020-08-19 ENCOUNTER — Other Ambulatory Visit: Payer: Self-pay | Admitting: *Deleted

## 2020-08-19 DIAGNOSIS — I83893 Varicose veins of bilateral lower extremities with other complications: Secondary | ICD-10-CM

## 2020-09-01 ENCOUNTER — Encounter: Payer: Self-pay | Admitting: Physician Assistant

## 2020-09-01 NOTE — Progress Notes (Signed)
Cardiology Office Note    Date:  09/04/2020   ID:  Edward [name], DOB [birth year], MRN [id number]  PCP:  Glendale Chard, MD  Cardiologist:  Fransico Him, MD  Electrophysiologist:  None   Chief Complaint: discuss echo, follow-up cardiomyopathy  History of Present Illness:   Edward [name] is a 70 y.o. male with history of varicose veins, abnormal EKG (LAFB), CVA, seasonal allergies, dilation of aorta, pulmonary nodule, HLD (patient reluctant for statin use), and recently unconvered cardiomyopathy who presents back for follow-up.  Per review of notes, he had previously suffered an acute CVA after taking a trip to [place] in 2016. Work-up at that time reportedly showed an enlarged left heart per patient however no further work-up was completed. He was referred to Dr. Radford Pax for evaluation of an abnormal EKG and sinus bradycardia with left anterior fascicular block and nonspecific T wave abnormalities. Repeat echocardiogram 07/2018 showed low normal LVEF at 50 to 55%, mild LVH and G1 DD. The ascending aorta was mildly enlarged at 42 mm with mild MR. Nuclear stress test at that time showed no ischemia. CT angio showed ectasia of the ascending thoracic aorta measuring 3.9cm and 58mm LLL. He has  family history of vascular disease with his mom having multiple CVAs in his father having CHF in the past, details unclear. He was seen back in follow-up in October and was doing well at that time. He had previously planned echo due in December to reassess his aortic enlargement. This was performed 08/04/20 which actually demonstrated newly reduced LVEF 35% with grade 1 DD, mildly reduced RV function, moderate dilation of aortic root. Lab workup showed normal ferritin, normal SPEP/UPEP, nonreactive HIV. Dr. Radford Pax recommended initiation of Entresto and spironolactone as well as coronary CTA.  He is seen back for follow-up doing well. We had a discussion about his study results and next steps. He has not had any clinical  signs of heart failure. No CP, SOB, palpitations, DOE, orthopnea or edema.   Labwork independently reviewed: 08/13/20 BMET wnl Cr 1.13, K 4.8, ALT 31, TSH wnl, ferritin 81, SPEP/UPEP OK, HIV nonreactive, LDL 127, UA OK   Past Medical History:  Diagnosis Date  . Abnormal EKG 05/29/2018   Sinus bradycardia and LAFB with TWI V4-V6, III and aVF  . Benign essential HTN 05/29/2018  . Cardiomyopathy ([place])    a. dx by echo 07/2020.  . Dilation of aorta (HCC)   . Environmental allergies   . Fracture, ribs   . H/O: varicose veins   . Hearing loss   . Hyperlipidemia LDL goal <70   . Multiple food allergies   . Rash   . Seasonal allergies   . Sinusitis   . Stroke [facility])    s/p tpa    Past Surgical History:  Procedure Laterality Date  . INGUINAL HERNIA REPAIR Left   . right hand surgery      Current Medications: Current Meds  Medication Sig  . aspirin EC 81 MG tablet Take 81 mg by mouth daily. Swallow whole.  . fluorouracil (EFUDEX) 5 % cream Apply topically as needed.  . metoprolol succinate (TOPROL XL) 25 MG 24 hr tablet Take 1 tablet (25 mg total) by mouth daily.  . metoprolol tartrate (LOPRESSOR) 100 MG tablet Take 1 tablet (100 mg total) 2 hours prior to CT scan if heart rate greater than 55 bpm.  . mupirocin ointment (BACTROBAN) 2 % Apply 1 application topically as needed.  . sacubitril-valsartan (ENTRESTO) 24-26 MG Take 1  tablet by mouth 2 (two) times daily.  . sildenafil (VIAGRA) 100 MG tablet Take 1 tablet by mouth as needed.   Marland Kitchen spironolactone (ALDACTONE) 25 MG tablet Take 0.5 tablets (12.5 mg total) by mouth daily.  . tacrolimus (PROTOPIC) 0.1 % ointment Apply 1 application topically as needed.     Allergies:   Patient has no known allergies.   Social History   Socioeconomic History  . Marital status: Married    Spouse name: Not on file  . Number of children: Not on file  . Years of education: Not on file  . Highest education level: Not on file  Occupational  History  . 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 Smoking status: Former Games developer  . Smokeless tobacco: Never Used  . Tobacco comment: quit 40+ years ago  Vaping Use  . Vaping Use: Never used  Substance and Sexual Activity  . Alcohol use: Yes    Comment: 2 times per week  . Drug use: Never  . Sexual activity: Yes  Other Topics Concern  . Not on file  Social History Narrative  . Not on file   Social Determinants of Health   Financial Resource Strain: Low Risk   . Difficulty of Paying Living Expenses: Not hard at all  Food Insecurity: No Food Insecurity  . Worried About Programme researcher, broadcasting/film/video in the Last Year: Never true  . Ran Out of Food in the Last Year: Never true  Transportation Needs: No Transportation Needs  . Lack of Transportation (Medical): No  . Lack of Transportation (Non-Medical): No  Physical Activity: Inactive  . Days of Exercise per Week: 0 days  . Minutes of Exercise per Session: 0 min  Stress: No Stress Concern Present  . Feeling of Stress : Not at all  Social Connections: Not on file     Family History:  The patient's family history includes Brain cancer in his brother; CVA in his mother; Heart failure in his father.  ROS:   Please see the history of present illness.  All other systems are reviewed and otherwise negative.    EKGs/Labs/Other Studies Reviewed:    Studies reviewed are outlined and summarized above. Reports included below if pertinent.  2D echo 07/2018 - Left ventricle: The cavity size was normal. There was mild  concentric hypertrophy. Systolic function was normal. The  estimated ejection fraction was in the range of 50% to 55%.  Diffuse hypokinesis. Doppler parameters are consistent with  abnormal left ventricular relaxation (grade 1 diastolic  dysfunction). Doppler parameters are consistent with elevated  ventricular end-diastolic filling pressure.  - Aortic valve: There was no regurgitation.  -  Aortic root: The aortic root was normal in size.  - Ascending aorta: The ascending aorta was mildly dilated measuring  42 mm.  - Mitral valve: There was mild regurgitation.  - Right ventricle: Systolic function was normal.  - Right atrium: The atrium was normal in size.  - Tricuspid valve: There was trivial regurgitation.  - Pulmonary arteries: Systolic pressure was within the normal  range.  - Inferior vena cava: The vessel was normal in size.  - Pericardium, extracardiac: There was no pericardial effusion.   07/2020  1. Left ventricular ejection fraction, by estimation, is 35%. The left  ventricle has moderately decreased function. The left ventricle  demonstrates global hypokinesis. Left ventricular diastolic parameters are  consistent with Grade I diastolic  dysfunction (impaired relaxation).  2. Right ventricular systolic function  is mildly reduced. The right  ventricular size is normal. There is normal pulmonary artery systolic  pressure. The estimated right ventricular systolic pressure is [id number] mmHg.  3. The mitral valve is normal in structure. Trivial mitral valve  regurgitation. No evidence of mitral stenosis.  4. The aortic valve is tricuspid. Aortic valve regurgitation is not  visualized. No aortic stenosis is present.  5. Aortic dilatation noted. There is moderate dilatation of the aortic  root, measuring 44 mm.  6. The inferior vena cava is normal in size with greater than 50%  respiratory variability, suggesting right atrial pressure of 3 mmHg.  CT 07/2019 IMPRESSION: 1. Thoracoabdominal aorta without evidence of aneurysm or dissection. Ectasia of the ascending thoracic aorta measuring 3.9 cm in AP diameter. Recommend annual imaging followup by CTA or MRA. This recommendation follows 2010 ACCF/AHA/AATS/ACR/ASA/SCA/SCAI/SIR/STS/SVM Guidelines for the Diagnosis and Management of Patients with Thoracic Aortic Disease. Circulation.2010; 121ML:4928372.  Aortic aneurysm NOS (ICD10-I71.9).  2.  No acute findings in the chest or abdomen.  3. 6 mm nodule over the lateral left lower lobe. Recommend follow-up CT 6 months.  This recommendation follows the consensus statement: Guidelines for Management of Small Pulmonary Nodules Detected on CT Scans: A Statement from the [place] [facility] as published in Radiology 2005; 237:395-400. Online at: https://www.arnold.com/.  4. Partially visualized small umbilical hernia containing only peritoneal fat mild colonic diverticulosis.     EKG:  EKG is ordered today, personally reviewed, demonstrating NSR 70bpm, LAFB, TWI III, poor R wave progression similar to prior. Obtained today since echocardiogram was done 07/2020 (and previous EKG was 05/2020).  Recent Labs: 08/13/2020: ALT 31; BUN 15; Creatinine, Ser 1.13; Potassium 4.8; Sodium 137; TSH 1.420  Recent Lipid Panel    Component Value Date/Time   CHOL 189 06/11/2020 1204   TRIG 99 06/11/2020 1204   HDL 44 06/11/2020 1204   CHOLHDL 4.3 06/11/2020 1204   LDLCALC 127 (H) 06/11/2020 1204    PHYSICAL EXAM:    VS:  BP 112/82   Pulse 76   Ht 6' (1.829 m)   Wt 195 lb (88.5 kg)   SpO2 98%   BMI 26.45 kg/m   BMI: Body mass index is 26.45 kg/m.  GEN: Well nourished, well developed WM, in no acute distress HEENT: normocephalic, atraumatic Neck: no JVD, carotid bruits, or masses Cardiac: RRR; no murmurs, rubs, or gallops, no edema  Respiratory:  clear to auscultation bilaterally, normal work of breathing GI: soft, nontender, nondistended, + BS MS: no deformity or atrophy Skin: warm and dry, no rash Neuro:  Alert and Oriented x 3, Strength and sensation are intact, follows commands Psych: euthymic mood, full affect  Wt Readings from Last 3 Encounters:  09/04/20 195 lb (88.5 kg)  09/04/20 194 lb 11.2 oz (88.3 kg)  07/14/20 196 lb 12.8 oz (89.3 kg)     ASSESSMENT & PLAN:   1. New cardiomyopathy -  etiology not clear. Discussed results in detail with patient. Dr. Radford Pax started [name] and spironolactone which the patient is tolerating well.  Will get updated BMET today now that we are a month out from starting these meds. Carvedilol was to be considered but BP tending to run low normal so will start Toprol 25mg  daily instead. He has a coronary CTA planned, pending insurance approval. Asked nursing staff to look into this as this will help guide next steps. The day of his CT he will take the previously ordered 100mg  of Lopressor instead of his usual scheduled Toprol.  He has no symptoms to suggest unstable angina. Reviewed 2g sodium restriction, 2L fluid restriction, daily weights with patient. If CT is unrevealing would consider discussion with MD regarding further evaluation for other causes of NICM. There is no LVH on his echo. He has no concomitant history of carpal tunnel or spinal stenosis.  2. Dilation of aorta - will be assessed by repeat CT scan. He also had incidental pulmonary nodule on scan from 2020 which will be reassessed on this scan. BP well controlled. Start low dose BB as above. Majority of recent HRs have all been 70s-80s.   3. Hyperlipidemia - patient declines statin therapy at this time. We can revisit when we have results of CT especially if the results are more compelling for aggressive intervention.  4. History of stroke - no recurrence, etiology not clear to me from report but occurred 1 week after long trip to [place]. Continue ASA. Await coronary CT scan.  Disposition: F/u with me, Kathyrn Drown or Dr. Radford Pax in 4 weeks.  Medication Adjustments/Labs and Tests Ordered: Current medicines are reviewed at length with the patient today.  Concerns regarding medicines are outlined above. Medication changes, Labs and Tests ordered today are summarized above and listed in the Patient Instructions accessible in Encounters.   Signed, Charlie Pitter, PA-C  09/04/2020 4:45 PM    [facility] [place], [place], [place]  [address] Phone: [phone]; Fax: [phone]

## 2020-09-04 ENCOUNTER — Other Ambulatory Visit: Payer: Self-pay

## 2020-09-04 ENCOUNTER — Encounter: Payer: Self-pay | Admitting: Physician Assistant

## 2020-09-04 ENCOUNTER — Ambulatory Visit (HOSPITAL_COMMUNITY)
Admission: RE | Admit: 2020-09-04 | Discharge: 2020-09-04 | Disposition: A | Discharge status: Home / Self Care | Payer: Medicare HMO | Source: Ambulatory Visit | Attending: Vascular Surgery | Admitting: Vascular Surgery

## 2020-09-04 ENCOUNTER — Ambulatory Visit: Payer: Medicare HMO | Admitting: Physician Assistant

## 2020-09-04 VITALS — BP 139/87 | HR 75 | Temp 97.3°F | Resp 16 | Ht 72.0 in | Wt 194.7 lb

## 2020-09-04 VITALS — BP 112/82 | HR 76 | Ht 72.0 in | Wt 195.0 lb

## 2020-09-04 DIAGNOSIS — I83893 Varicose veins of bilateral lower extremities with other complications: Secondary | ICD-10-CM

## 2020-09-04 DIAGNOSIS — I429 Cardiomyopathy, unspecified: Secondary | ICD-10-CM | POA: Diagnosis not present

## 2020-09-04 DIAGNOSIS — Z8673 Personal history of transient ischemic attack (TIA), and cerebral infarction without residual deficits: Secondary | ICD-10-CM

## 2020-09-04 DIAGNOSIS — I77819 Aortic ectasia, unspecified site: Secondary | ICD-10-CM

## 2020-09-04 DIAGNOSIS — E785 Hyperlipidemia, unspecified: Secondary | ICD-10-CM | POA: Diagnosis not present

## 2020-09-04 MED ORDER — METOPROLOL SUCCINATE ER 25 MG PO TB24
25.0000 mg | ORAL_TABLET | Freq: Every day | ORAL | 3 refills | Status: DC
Start: 2020-09-04 — End: 2021-09-21

## 2020-09-04 NOTE — Progress Notes (Signed)
[facility]           [facility] [place]  History and Physical   Edward [name] is a 70 y.o. male who presents with bilateral lower extremity varicose veins.  He states that he saw his dermatologist and he has a patch of eczema on the LLE.  This has been treated with creams with some improvement.  At one time, it was thought this could be related to his varicose veins.  He has very little swelling or achiness.  He states the varicosities on the left leg are worse than the right.  He has never had a DVT.  He states he has tried to use knee high compression in the past, but these were uncomfortable and he felt like it may have made them some worse around his knees.   He does elevate his legs when he comes home from work.  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    Pt has hx of thoracoabdominal aneurysm that is being followed by [facility].  He has an appt this afternoon for follow up.  He states he had taken a long plane ride in the distant past that was 5 hours.  He did not get up on either flight to or from.  He states when he returned, he had a stroke and received TPA.  He states he does not have any residual from the stroke.   The pt is not on a statin for cholesterol management.  The pt is on a daily aspirin.   Other AC:  none The pt is on BB, ARB for hypertension.   The pt is not diabetic.   Tobacco hx:  former   Past Medical History:  Diagnosis Date  . Abnormal EKG 05/29/2018   Sinus bradycardia and LAFB with TWI V4-V6, III and aVF  . Benign essential HTN 05/29/2018  . Cardiomyopathy ([place])    a. dx by echo 07/2020.  . Dilation of aorta (HCC)   . Environmental allergies   . Fracture, ribs   . H/O: varicose veins   . Hearing loss   . Hyperlipidemia LDL goal <70   . Multiple food allergies   . Rash   . Seasonal allergies    . Sinusitis   . Stroke [facility])    s/p tpa    Past Surgical History:  Procedure Laterality Date  . INGUINAL HERNIA REPAIR Left   . right hand surgery      Social History   Socioeconomic History  . Marital status: Married    Spouse name: Not on file  . Number of children: Not on file  . Years of education: Not on file  . Highest education level: Not on file  Occupational History  . 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 Smoking status: Former Research scientist (life sciences)  . Smokeless tobacco: Never Used  . Tobacco comment: quit 40+ years ago  Vaping Use  . Vaping Use: Never used  Substance and Sexual Activity  . Alcohol use: Yes    Comment: 2 times per week  . Drug use: Never  . Sexual activity: Yes  Other Topics Concern  . Not on file  Social History Narrative  . Not on file  Social Determinants of Health   Financial Resource Strain: Low Risk   . Difficulty of Paying Living Expenses: Not hard at all  Food Insecurity: No Food Insecurity  . Worried About Charity fundraiser in the Last Year: Never true  . Ran Out of Food in the Last Year: Never true  Transportation Needs: No Transportation Needs  . Lack of Transportation (Medical): No  . Lack of Transportation (Non-Medical): No  Physical Activity: Inactive  . Days of Exercise per Week: 0 days  . Minutes of Exercise per Session: 0 min  Stress: No Stress Concern Present  . Feeling of Stress : Not at all  Social Connections: Not on file  Intimate Partner Violence: Not on file     Family History  Problem Relation Age of Onset  . CVA Mother   . Heart failure Father        CHF  . Brain cancer Brother        GBM    Current Outpatient Medications  Medication Sig Dispense Refill  . aspirin EC 81 MG tablet Take 81 mg by mouth daily. Swallow whole.    . sacubitril-valsartan (ENTRESTO) 24-26 MG Take 1 tablet by mouth 2 (two) times daily. 60 tablet 3  . sildenafil (VIAGRA) 100 MG tablet Take 1 tablet by mouth  as needed.     . metoprolol tartrate (LOPRESSOR) 100 MG tablet Take 1 tablet (100 mg total) 2 hours prior to CT scan if heart rate greater than 55 bpm. (Patient not taking: Reported on 09/04/2020) 1 tablet 0  . spironolactone (ALDACTONE) 25 MG tablet Take 0.5 tablets (12.5 mg total) by mouth daily. (Patient not taking: Reported on 09/04/2020) 45 tablet 3   No current facility-administered medications for this visit.    No Known Allergies  REVIEW OF SYSTEMS:   [X]  denotes positive finding, [ ]  denotes negative finding Cardiac  Comments:  Chest pain or chest pressure:    Shortness of breath upon exertion:    Short of breath when lying flat:    Irregular heart rhythm:        Vascular    Pain in calf, thigh, or hip brought on by ambulation:    Pain in feet at night that wakes you up from your sleep:     Blood clot in your veins:    Leg swelling:         Pulmonary    Oxygen at home:    Productive cough:     Wheezing:         Neurologic    Sudden weakness in arms or legs:     Sudden numbness in arms or legs:     Sudden onset of difficulty speaking or slurred speech:    Temporary loss of vision in one eye:     Problems with dizziness:         Gastrointestinal    Blood in stool:     Vomited blood:         Genitourinary    Burning when urinating:     Blood in urine:        Psychiatric    Major depression:         Hematologic    Bleeding problems:    Problems with blood clotting too easily:        Skin    Rashes or ulcers:        Constitutional    Fever or chills:      PHYSICAL  EXAMINATION:  Today's Vitals   09/04/20 1456 09/04/20 1459  BP: 139/87   Pulse: 75   Resp: 16   Temp: (!) 97.3 F (36.3 C)   TempSrc: Temporal   SpO2: 97%   Weight: 194 lb 11.2 oz (88.3 kg)   Height: 6' (1.829 m)   PainSc: 0-No pain 0-No pain   Body mass index is 26.41 kg/m.   General:  WDWN in NAD; vital signs documented above Gait: Not observed HENT: WNL,  normocephalic Pulmonary: normal non-labored breathing without wheezing Cardiac: regular HR; without carotid bruits Abdomen: soft, NT, no masses; aortic pulse is not palpable Skin: without rashes Vascular Exam/Pulses:  Right Left  Radial 2+ (normal) 2+ (normal)  DP 2+ (normal) 2+ (normal)  PT 2+ (normal) 2+ (normal)   Extremities:       Musculoskeletal: no muscle wasting or atrophy  Neurologic: A&O X 3;  moving all extremities equally Psychiatric:  The pt has Normal affect.   Non-Invasive Vascular Imaging:   Venous duplex on 09/05/2019: Venous Reflux Times  +--------------+---------+------+-----------+------------+-----------------  RIGHT     Reflux NoRefluxReflux TimeDiameter cmsComments                   Yes                       +--------------+---------+------+-----------+------------+-----------------  CFV      no                             +--------------+---------+------+-----------+------------+-----------------  FV mid          yes                       +--------------+---------+------+-----------+------------+-----------------  Popliteal         yes                       +--------------+---------+------+-----------+------------+-----------------  GSV at SFJ        yes  >500 ms   0.951            +--------------+---------+------+-----------+------------+-----------------   GSV prox thigh      yes  >500 ms   0.45            +--------------+---------+------+-----------+------------+-----------------  GSV mid thigh       yes  >500 ms   1.24  with  communicator  +--------------+---------+------+-----------+------------+-----------------  GSV dist thigh      yes  >500 ms   0.397             +--------------+---------+------+-----------+------------+-----------------  GSV at knee  no               0.34            +--------------+---------+------+-----------+------------+-----------------  GSV prox calf       yes  >500 ms   0.202            +--------------+---------+------+-----------+------------+-----------------   SSV Pop Fossa no               0.35            +--------------+---------+------+-----------+------------+-----------------   SSV prox calf no               0.243            +--------------+---------+------+-----------+------------+-----------------   SSV mid calf no               0.255            +--------------+---------+------+-----------+------------+-----------------  +     +--------------+---------+------+-----------+------------+--------+  LEFT     Reflux NoRefluxReflux TimeDiameter cmsComments               Yes                   +--------------+---------+------+-----------+------------+--------+  CFV            yes  >1 second             +--------------+---------+------+-----------+------------+--------+  FV mid    no                         +--------------+---------+------+-----------+------------+--------+  Popliteal   no                         +--------------+---------+------+-----------+------------+--------+  GSV at SFJ        yes  >500 ms   1.26        +--------------+---------+------+-----------+------------+--------+  GSV prox thigh      yes  >500 ms   0.615        +--------------+---------+------+-----------+------------+--------+  GSV mid thigh       yes  >500 ms   0.591         +--------------+---------+------+-----------+------------+--------+  GSV dist thighno               0.498        +--------------+---------+------+-----------+------------+--------+  GSV at knee  no               0.387        +--------------+---------+------+-----------+------------+--------+  GSV prox calf no               0.308        +--------------+---------+------+-----------+------------+--------+  SSV Pop Fossa no               0.243        +--------------+---------+------+-----------+------------+--------+  SSV prox calf no               0.22        +--------------+---------+------+-----------+------------+--------+  SSV mid calf no               0.236        +--------------+---------+------+-----------+------------+--------+   Summary:  Right:  - No evidence of deep vein thrombosis seen in the right lower extremity,  from the common femoral through the popliteal veins.  - No evidence of superficial venous thrombosis in the right lower  extremity.  - Deep vein reflux in the CFV.  - Superficial vein reflux in the SFJ and GSV proximal and mid thigh, and  in the proximal calf..    Left:  - No evidence of deep vein thrombosis seen in the left lower extremity,  from the common femoral through the popliteal veins.  - No evidence of superficial venous thrombosis in the left lower  extremity.  - Baker's cyst in the popliteal fossa.  - Superficial vein reflux in the CFV.  - Superficial vein reflux in the SFJ, and GSV proximal and mid thigh.    Edward [name] is a 70 y.o. male who presents with: BLE varicose veins  Pt does not have evidence of DVT in BLE.  He does have reflux in both the superficial and deep systems with possibility of being a candidate for laser ablation.   -discussed with pt about wearing thigh high 20-23mmHg  compression stockings  -discussed the importance of leg elevation and how to elevate.    -he was given handout with information -  I will have him f/u in 3 months with either Dr. Scot Dock or Dr. Oneida Alar for further evaluation for laser ablation.     Leontine Locket, [facility] 09/04/2020 3:30 PM  Clinic MD:  Oneida Alar

## 2020-09-04 NOTE — Patient Instructions (Addendum)
Medication Instructions:  Your physician has recommended you make the following change in your medication:  1.  START Toprol XL 25 taking 1 tablet daily (DO NOT TAKE ON THE DAY OF THE CARDIAC CT.Marland Kitchen TAKE THE 100 MG TABLET GIVEN ALREADY GIVEN)  *If you need a refill on your cardiac medications before your next appointment, please call your pharmacy*   Lab Work: TODAY:  BMET  If you have labs (blood work) drawn today and your tests are completely normal, you will receive your results only by: Marland Kitchen MyChart Message (if you have MyChart) OR . A paper copy in the mail If you have any lab test that is abnormal or we need to change your treatment, we will call you to review the results.   Testing/Procedures: None ordered   Follow-Up: At [facility], you and your health needs are our priority.  As part of our continuing mission to provide you with exceptional heart care, we have created designated Provider Care Teams.  These Care Teams include your primary Cardiologist (physician) and Advanced Practice Providers (APPs -  Physician Assistants and Nurse Practitioners) who all work together to provide you with the care you need, when you need it.  We recommend signing up for the patient portal called "MyChart".  Sign up information is provided on this After Visit Summary.  MyChart is used to connect with patients for Virtual Visits (Telemedicine).  Patients are able to view lab/test results, encounter notes, upcoming appointments, etc.  Non-urgent messages can be sent to your provider as well.   To learn more about what you can do with MyChart, go to [email or web address].    Your next appointment:   4 week(s)  The format for your next appointment:   In Person  Provider:   You may see Armanda Magic, MD or one of the following Advanced Practice Providers on your designated Care Team:    Ronie Spies, [place]  Georgie Chard, NP    Other Instructions

## 2020-09-05 LAB — BASIC METABOLIC PANEL
BUN/Creatinine Ratio: 14 (ref 10–24)
BUN: 16 mg/dL (ref 8–27)
CO2: 24 mmol/L (ref 20–29)
Calcium: 9.2 mg/dL (ref 8.6–10.2)
Chloride: 104 mmol/L (ref 96–106)
Creatinine, Ser: 1.15 mg/dL (ref 0.76–1.27)
GFR calc Af Amer: 76 mL/min/{1.73_m2} (ref >59)
GFR calc non Af Amer: 65 mL/min/{1.73_m2} (ref >59)
Glucose: 91 mg/dL (ref 65–99)
Potassium: 4.4 mmol/L (ref 3.5–5.2)
Sodium: 141 mmol/L (ref 134–144)

## 2020-09-18 ENCOUNTER — Other Ambulatory Visit: Payer: Self-pay

## 2020-09-18 DIAGNOSIS — E782 Mixed hyperlipidemia: Secondary | ICD-10-CM

## 2020-09-18 DIAGNOSIS — I10 Essential (primary) hypertension: Secondary | ICD-10-CM

## 2020-09-19 ENCOUNTER — Telehealth: Payer: Self-pay | Admitting: *Deleted

## 2020-09-19 NOTE — Chronic Care Management (AMB) (Signed)
Chronic Care Management   Note  09/19/2020 Name: Edward [name] MRN: [id number] DOB: 08/14/1951  Edward [name] is a 70 y.o. year old male who is a primary care patient of Glendale Chard, MD. I reached out to Edward [name] by phone today in response to a referral sent by Edward [name] PCP, Glendale Chard, MD.     Edward [name] was given information about Chronic Care Management services today including:  1. CCM service includes personalized support from designated clinical staff supervised by his physician, including individualized plan of care and coordination with other care providers 2. 24/7 contact phone numbers for assistance for urgent and routine care needs. 3. Service will only be billed when office clinical staff spend 20 minutes or more in a month to coordinate care. 4. Only one practitioner may furnish and bill the service in a calendar month. 5. The patient may stop CCM services at any time (effective at the end of the month) by phone call to the office staff. 6. The patient will be responsible for cost sharing (co-pay) of up to 20% of the service fee (after annual deductible is met).  Patient agreed to services and verbal consent obtained.   Follow up plan: Telephone appointment with care management team member scheduled for: 10/15/2020  [place] Management  Direct Dial: [phone]

## 2020-09-25 ENCOUNTER — Telehealth: Payer: Self-pay

## 2020-09-25 NOTE — Telephone Encounter (Signed)
John,  Please review patient's ECHO results ---EF 35%- NO AVS Please advise if patient is cleared for his procedure at [facility]?  thanks

## 2020-09-29 ENCOUNTER — Telehealth (HOSPITAL_COMMUNITY): Payer: Self-pay | Admitting: Emergency Medicine

## 2020-09-29 NOTE — Telephone Encounter (Signed)
Reaching out to patient to offer assistance regarding upcoming cardiac imaging study; pt verbalizes understanding of appt date/time, parking situation and where to check in, pre-test NPO status and medications ordered, and verified current allergies; name and call back number provided for further questions should they arise Marchia Bond RN Navigator Cardiac Imaging [facility] [phone] office [phone] cell   Pt told to hold spironolactone, viagra, or daily dose toprol xl; taking 100mg  metoprolol 2h prior to scan. Clarise Cruz

## 2020-09-29 NOTE — Telephone Encounter (Signed)
Attempted to call patient regarding upcoming cardiac CT appointment. °Left message on voicemail with name and callback number °Sankalp Ferrell RN Navigator Cardiac Imaging °[facility] °[phone] Office °[phone] Cell ° °

## 2020-09-30 ENCOUNTER — Other Ambulatory Visit: Payer: Self-pay

## 2020-09-30 ENCOUNTER — Ambulatory Visit (HOSPITAL_COMMUNITY)
Admission: RE | Admit: 2020-09-30 | Discharge: 2020-09-30 | Disposition: A | Discharge status: Home / Self Care | Payer: Medicare HMO | Source: Ambulatory Visit | Attending: Cardiology | Admitting: Cardiology

## 2020-09-30 DIAGNOSIS — I77819 Aortic ectasia, unspecified site: Secondary | ICD-10-CM

## 2020-09-30 DIAGNOSIS — I712 Thoracic aortic aneurysm, without rupture: Secondary | ICD-10-CM | POA: Diagnosis not present

## 2020-09-30 DIAGNOSIS — I10 Essential (primary) hypertension: Secondary | ICD-10-CM | POA: Insufficient documentation

## 2020-09-30 DIAGNOSIS — I77810 Thoracic aortic ectasia: Secondary | ICD-10-CM | POA: Insufficient documentation

## 2020-09-30 DIAGNOSIS — R9431 Abnormal electrocardiogram [ECG] [EKG]: Secondary | ICD-10-CM | POA: Insufficient documentation

## 2020-09-30 DIAGNOSIS — E782 Mixed hyperlipidemia: Secondary | ICD-10-CM | POA: Insufficient documentation

## 2020-09-30 DIAGNOSIS — I7121 Aneurysm of the ascending aorta, without rupture: Secondary | ICD-10-CM

## 2020-09-30 MED ORDER — NITROGLYCERIN 0.4 MG SL SUBL
0.8000 mg | SUBLINGUAL_TABLET | Freq: Once | SUBLINGUAL | Status: AC
  Administered 2020-09-30: 0.8 mg via SUBLINGUAL

## 2020-09-30 MED ORDER — IOHEXOL 350 MG/ML SOLN
80.0000 mL | Freq: Once | INTRAVENOUS | Status: AC | PRN
  Administered 2020-09-30: 80 mL via INTRAVENOUS

## 2020-09-30 MED ORDER — NITROGLYCERIN 0.4 MG SL SUBL: 0.8000 mg | SUBLINGUAL_TABLET | Freq: Once | SUBLINGUAL | Status: DC

## 2020-09-30 MED ORDER — NITROGLYCERIN 0.4 MG SL SUBL
SUBLINGUAL_TABLET | SUBLINGUAL | Status: AC
  Filled 2020-09-30: qty 2

## 2020-09-30 NOTE — Telephone Encounter (Signed)
Edward [name],  Please advise if patient is cleared for [place] procedure as the patient's PV appt is 10/01/2020. Thanks

## 2020-09-30 NOTE — Telephone Encounter (Signed)
Edward [name], ° °This pt is cleared for anesthetic care at [facility]. ° °Thanks, ° °Dominque Marlin °

## 2020-10-01 ENCOUNTER — Ambulatory Visit (AMBULATORY_SURGERY_CENTER): Payer: Self-pay | Admitting: *Deleted

## 2020-10-01 ENCOUNTER — Other Ambulatory Visit: Payer: Self-pay

## 2020-10-01 VITALS — Ht 72.0 in | Wt 192.0 lb

## 2020-10-01 DIAGNOSIS — Z1211 Encounter for screening for malignant neoplasm of colon: Secondary | ICD-10-CM

## 2020-10-01 NOTE — Progress Notes (Signed)

## 2020-10-10 DIAGNOSIS — N5201 Erectile dysfunction due to arterial insufficiency: Secondary | ICD-10-CM | POA: Diagnosis not present

## 2020-10-10 DIAGNOSIS — N402 Nodular prostate without lower urinary tract symptoms: Secondary | ICD-10-CM | POA: Diagnosis not present

## 2020-10-10 DIAGNOSIS — L209 Atopic dermatitis, unspecified: Secondary | ICD-10-CM | POA: Diagnosis not present

## 2020-10-15 ENCOUNTER — Encounter: Payer: Self-pay | Admitting: Gastroenterology

## 2020-10-15 ENCOUNTER — Ambulatory Visit (INDEPENDENT_AMBULATORY_CARE_PROVIDER_SITE_OTHER): Payer: Medicare HMO

## 2020-10-15 DIAGNOSIS — I10 Essential (primary) hypertension: Secondary | ICD-10-CM | POA: Diagnosis not present

## 2020-10-15 DIAGNOSIS — E782 Mixed hyperlipidemia: Secondary | ICD-10-CM

## 2020-10-15 NOTE — Progress Notes (Signed)
Chronic Care Management Pharmacy Note  10/15/2020 Name:  Edward [name] MRN:  [id number] DOB:  11-14-1950  Subjective: Kavion Mancinas is an 70 y.o. year old male who is a primary patient of Glendale Chard, MD.  The CCM team was consulted for assistance with disease management and care coordination needs.    Patient has a box of daily dose supplements that he takes including his current medications. They include a multivitamin, InstaFlex, cinnamon, co-Q10   Engaged with patient by telephone for initial visit in response to provider referral for pharmacy case management and/or care coordination services.   Consent to Services:  The patient was given the following information about Chronic Care Management services today, agreed to services, and gave verbal consent: 1. CCM service includes personalized support from designated clinical staff supervised by the primary care provider, including individualized plan of care and coordination with other care providers 2. 24/7 contact phone numbers for assistance for urgent and routine care needs. 3. Service will only be billed when office clinical staff spend 20 minutes or more in a month to coordinate care. 4. Only one practitioner may furnish and bill the service in a calendar month. 5.The patient may stop CCM services at any time (effective at the end of the month) by phone call to the office staff. 6. The patient will be responsible for cost sharing (co-pay) of up to 20% of the service fee (after annual deductible is met). Patient agreed to services and consent obtained.  Patient Care Team: Glendale Chard, MD as PCP - General (Internal Medicine) Sueanne Margarita, MD as PCP - Cardiology (Cardiology) Tat, Eustace Quail, DO as Consulting Physician (Neurology) Mayford Knife, [facility] (Pharmacist)  Recent office visits:  07/14/2020 OV   Recent consult visits:  06/19/2020 Cardiology Consult :We discussed his LDL finding on lab work from last week. He remains  reluctant to take statins st this time and prefers to continue with diet and exercise modifications. If open to statins in the future, would try Crestor as he reports myalgias with [facility] visits: None in previous 6 months  Objective:  Lab Results  Component Value Date   CREATININE 1.15 09/04/2020   BUN 16 09/04/2020   GFRNONAA 65 09/04/2020   GFRAA 76 09/04/2020   NA 141 09/04/2020   K 4.4 09/04/2020   CALCIUM 9.2 09/04/2020   CO2 24 09/04/2020    Lab Results  Component Value Date/Time   MICROALBUR 10 07/14/2020 03:58 PM    Last diabetic Eye exam: No results found for: HMDIABEYEEXA  Last diabetic Foot exam: No results found for: HMDIABFOOTEX   Lab Results  Component Value Date   CHOL 189 06/11/2020   HDL 44 06/11/2020   LDLCALC 127 (H) 06/11/2020   TRIG 99 06/11/2020   CHOLHDL 4.3 06/11/2020    Hepatic Function Latest Ref Rng & Units 08/13/2020  Total Protein 6.0 - 8.5 g/dL 6.4  ALT 0 - 44 IU/L 31    Lab Results  Component Value Date/Time   TSH 1.420 08/13/2020 11:55 AM   TSH 1.650 05/24/2019 12:42 PM    CBC Latest Ref Rng & Units 05/24/2019  WBC 3.4 - 10.8 x10E3/uL 6.3  Hemoglobin 13.0 - 17.7 g/dL 13.9  Hematocrit 37.5 - 51.0 % 42.0  Platelets 150 - 450 x10E3/uL 307    No results found for: VD25OH  Clinical ASCVD: Yes  The 10-year ASCVD risk score Mikey Bussing DC Jr., et al., 2013) is: 10.1%   Values used  to calculate the score:     Age: 59 years     Sex: Male     Is Non-Hispanic African American: No     Diabetic: No     Tobacco smoker: No     Systolic Blood Pressure: 92 mmHg     Is BP treated: Yes     HDL Cholesterol: 44 mg/dL     Total Cholesterol: 189 mg/dL    Depression screen [facility] 2/9 06/11/2020 09/26/2019 05/24/2019  Decreased Interest 0 0 0  Down, Depressed, Hopeless 0 0 0  PHQ - 2 Score 0 0 0  Altered sleeping - - 0  Tired, decreased energy - - 0  Change in appetite - - 0  Feeling bad or failure about yourself  - - 0  Trouble  concentrating - - 0  Moving slowly or fidgety/restless - - 0  Suicidal thoughts - - 0  PHQ-9 Score - - 0      Social History   Tobacco Use  Smoking Status Former Smoker  Smokeless Tobacco Never Used  Tobacco Comment   quit 40+ years ago   BP Readings from Last 3 Encounters:  09/30/20 92/73  09/04/20 112/82  09/04/20 139/87   Pulse Readings from Last 3 Encounters:  09/30/20 65  09/04/20 76  09/04/20 75   Wt Readings from Last 3 Encounters:  10/01/20 192 lb (87.1 kg)  09/04/20 195 lb (88.5 kg)  09/04/20 194 lb 11.2 oz (88.3 kg)    Assessment/Interventions: Review of patient past medical history, allergies, medications, health status, including review of consultants reports, laboratory and other test data, was performed as part of comprehensive evaluation and provision of chronic care management services.   SDOH:  (Social Determinants of Health) assessments and interventions performed: Yes SDOH Interventions   Flowsheet Row Most Recent Value  SDOH Interventions   Financial Strain Interventions Other (Comment)  [Patient assistance enrollment for Entresto.]  Physical Activity Interventions Other (Comments)  [Patient reports that he walks at least 10,000 steps per day at his job 5 days per week.]      [place]  No Known Allergies  Medications Reviewed Today    Reviewed by Mayford Knife, [place] (Pharmacist) on 10/15/20 at 1413  Med List Status: <None>  Medication Order Taking? Sig Documenting Provider Last Dose Status Informant  aspirin EC 81 MG tablet [id number] Yes Take 81 mg by mouth daily. Swallow whole. [provider] Taking Active   fluorouracil (EFUDEX) 5 % cream [id number]  Apply topically as needed. [provider]  Active   metoprolol succinate (TOPROL XL) 25 MG 24 hr tablet [id number] Yes Take 1 tablet (25 mg total) by mouth daily. Charlie Pitter, PA-C Taking Active   mupirocin ointment (BACTROBAN) 2 % [id number] Yes Apply 1 application  topically as needed. [provider] Taking Active   Polyethylene Glycol 3350 (MIRALAX PO) [id number]  Take 238 g by mouth once. Colonoscopy prep [provider]  Active   sacubitril-valsartan (ENTRESTO) 24-26 MG [id number] Yes Take 1 tablet by mouth 2 (two) times daily. Sueanne Margarita, MD Taking Active   sildenafil (VIAGRA) 100 MG tablet [id number] Yes Take 1 tablet by mouth as needed.  [provider] Taking Active   spironolactone (ALDACTONE) 25 MG tablet [id number] Yes Take 0.5 tablets (12.5 mg total) by mouth daily. Sueanne Margarita, MD Taking Active   tacrolimus (PROTOPIC) 0.1 % ointment [id number] Yes Apply 1 application topically as needed. [provider] Taking Active  Patient Active Problem List   Diagnosis Date Noted  . Mixed hyperlipidemia 06/11/2020  . Solitary pulmonary nodule 08/08/2019  . Diverticulitis of colon 08/08/2019  . Umbilical hernia 26/33/3545  . Elevated LDL cholesterol level 08/08/2019  . Ascending aorta dilation ([place]) 05/24/2019  . Benign head tremor 05/24/2019  . Abnormal EKG 05/29/2018  . Benign essential HTN 05/29/2018    Immunization History  Administered Date(s) Administered  . Fluad Quad(high Dose 65+) 06/11/2020  . Janssen (J&J) SARS-COV-2 Vaccination 12/29/2019  . Pneumococcal Conjugate-13 05/24/2019  . Pneumococcal-Unspecified 06/29/2020    Conditions to be addressed/monitored:  Hypertension and Hyperlipidemia  Care Plan : [place]  Updates made by Mayford Knife, RPH since 10/15/2020 12:00 AM    Problem: HTN, HLD,Cardiomyopathy   Priority: High  Onset Date: 10/15/2020    Goal: Patient-Specific Goal     Long-Range Goal: Disease Management   Start Date: 10/15/2020  This Visit's Progress: On track  Priority: High  Note:     Current Barriers:  . Unable to achieve control of cholesterol   . Suboptimal therapeutic regimen for hyperlipidemia   Pharmacist Clinical Goal(s):   Marland Kitchen Over the next 90 days, patient will achieve control of cholesterol as evidenced by an LDL <99 through collaboration with PharmD and provider.  . Patient to be comfortable taking Crestor 10 mg tablet every other day.   Interventions: . 1:1 collaboration with Glendale Chard, MD regarding development and update of comprehensive plan of care as evidenced by provider attestation and co-signature . Inter-disciplinary care team collaboration (see longitudinal plan of care) . Comprehensive medication review performed; medication list updated in electronic medical record  Hypertension (BP goal <130/80) -controlled -Current treatment: . Metoprolol Succinate 25 mg -taking 1 tablet by mouth daily  . Entresto 71m-26mg  - taking two times  daily  . Spironolactone 25 mg taking daily  -Medications previously tried: none  -Current home readings: Patient has not been checking his BP at home as often because the readings do not seem accurate. Patient seen by the cardiologist on 10/05/2020 his readings were good.  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  Collaborated with PCP team to start patient on Crestor 5 mg tablet every other day, and asses patient at four weeks to determine how well he is doing with the medication.   Cardiomyopathy -controlled -Current treatment  . Entresto 24-26 mg taking 1 tablet daily  o [facility] - Was $40-$50 . Spironolactone 25 mg take daily  . Metoprolol Succinate  25 mg take daily  -Medications previously tried: none   Patient followed by Cardiology   Patient to have imaging study done in February  -Assessed patient finances. Patients will need patient assistance for Entresto.     Health Maintenance -Vaccine gaps: patient is not interested in COVID-19 Booster at this time. He does not feel like there is a benefit. Discussed the importance of vaccination due to his job that requires a lot of interaction with people and going to the gym. I recommended that the patient be seen by  -Current therapy:  . Multivitamin - taking 1 tablet daily  . InstaFlex- 1 tablet daily . Cinnamon-1 tablet daily  -Educated on Herbal supplement research is limited and benefits usually cannot be proven Supplements may interfere with prescription drugs -Patient is satisfied with current therapy and  denies issues -Recommended patient get COVID booster, also recommended shingrix vaccine.    Patient Goals/Self-Care Activities . Over the next 90 days, patient will:  - take medications as prescribed focus on medication adherence by using pill box reminder system check blood pressure at least three times per week , document, and provide at future appointments  Follow Up Plan: Telephone follow up appointment with care management team member scheduled for: The patient has been provided with contact information for the care management team and has been advised to call with any health related questions or concerns.       Medication Assistance: Application for Entresto   medication assistance program. in process.  Anticipated assistance start date 11/2020.  See plan of care for additional detail.  Patient's preferred pharmacy is:  [id number] [place] [place] - [id number]: [phone]: [phone] Uses pill box? Yes Pt endorses 100% compliance  We discussed: Benefits of medication synchronization, packaging and delivery as well as enhanced pharmacist oversight with [employer]. Patient decided to: Continue current medication management strategy  Care Plan and Follow Up Patient Decision:  Patient agrees to Care Plan and Follow-up.  Plan: Telephone follow up appointment with care management team member scheduled for:  01/14/2021, The patient has been provided with contact information for the care management team and has been advised to call with any health related questions or concerns.  and The care management team will reach out to the patient again over the next 90 days.  VOrlando Penner PharmD Clinical Pharmacist [facility] [phone]

## 2020-10-15 NOTE — Patient Instructions (Addendum)
Visit Information It was great speaking with you today!  Please let me know if you have any questions about our visit.  Goals Addressed            This Visit's Progress   . Manage My Medicine       Timeframe:  Long-Range Goal Priority:  High Start Date:                             Expected End Date:                          - call for medicine refill 2 or 3 days before it runs out - keep a list of all the medicines I take; vitamins and herbals too - use a pillbox to sort medicine    Why is this important?   . These steps will help you keep on track with your medicines.          Patient Care Plan: CCM Pharmacy Care Plan    Problem Identified: HTN, HLD,Cardiomyopathy   Priority: High  Onset Date: 10/15/2020    Long-Range Goal: Disease Management   Start Date: 10/15/2020  This Visit's Progress: On track  Priority: High  Note:     Current Barriers:  . Unable to achieve control of cholesterol   . Suboptimal therapeutic regimen for hyperlipidemia   Pharmacist Clinical Goal(s):  Marland Kitchen Over the next 90 days, patient will achieve control of cholesterol as evidenced by an LDL <99 through collaboration with PharmD and provider.  . Patient to be comfortable taking Crestor 10 mg tablet every other day.   Interventions: . 1:1 collaboration with Glendale Chard, MD regarding development and update of comprehensive plan of care as evidenced by provider attestation and co-signature . Inter-disciplinary care team collaboration (see longitudinal plan of care) . Comprehensive medication review performed; medication list updated in electronic medical record  Hypertension (BP goal <130/80) -controlled -Current treatment: . Metoprolol Succinate 25 mg -taking 1 tablet by mouth daily  . Entresto 24mg -26mg   - taking two times  daily  . Spironolactone 25 mg taking daily  -Medications previously tried: none  -Current home readings: Patient has not been checking his BP at home as often  because the readings do not seem accurate. Patient seen by the cardiologist on 10/05/2020 his readings were good.  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  Collaborated with PCP team to start patient on Crestor 5 mg tablet every other day, and asses patient at four weeks to determine how well he is doing with the medication.   Cardiomyopathy -controlled -Current  treatment  . Entresto 24-26 mg taking 1 tablet daily  o Patient is picking up medication from [facility] - Was $40-$50 . Spironolactone 25 mg take daily  . Metoprolol Succinate  25 mg take daily  -Medications previously tried: none   Patient followed by Cardiology   Patient to have imaging study done in February  -Assessed patient finances. Patients will need patient assistance for Entresto.     Health Maintenance -Vaccine gaps: patient is not interested in COVID-19 Booster at this time. He does not feel like there is a benefit. Discussed the importance of vaccination due to his job that requires a lot of interaction with people and going to the gym. I recommended that the patient be seen by  -Current therapy:  . Multivitamin - taking 1 tablet daily  . InstaFlex- 1 tablet daily . Cinnamon-1 tablet daily  -Educated on Herbal supplement research is limited and benefits usually cannot be proven Supplements may interfere with prescription drugs -Patient is satisfied with current therapy and denies issues -Recommended patient get COVID booster, also recommended shingrix vaccine.    Patient Goals/Self-Care Activities . Over the next 90 days, patient will:  - take medications as prescribed focus on medication adherence by using pill box reminder system check blood pressure at least three times per week , document, and provide at future appointments  Follow Up Plan: Telephone follow up appointment with care management team member scheduled for: approximately 3 months from now  The patient has been provided with contact information for the care management team and has been advised to call with any health related questions or concerns.       Edward [name] was given information about Chronic Care Management services today including:  1. CCM service includes personalized support from designated clinical staff supervised by his physician, including individualized plan of care and coordination  with other care providers 2. 24/7 contact phone numbers for assistance for urgent and routine care needs. 3. Standard insurance, coinsurance, copays and deductibles apply for chronic care management only during months in which we provide at least 20 minutes of these services. Most insurances cover these services at 100%, however patients may be responsible for any copay, coinsurance and/or deductible if applicable. This service may help you avoid the need for more expensive face-to-face services. 4. Only one practitioner may furnish and bill the service in a calendar month. 5. The patient may stop CCM services at any time (effective at the end of the month) by phone call to the office staff.  Patient agreed to services and verbal consent obtained.   The patient verbalized understanding of instructions, educational materials, and care plan provided today and agreed to receive a mailed copy of patient instructions, educational materials, and care plan.   [name], PharmD Clinical Pharmacist [facility] [phone]

## 2020-10-21 ENCOUNTER — Ambulatory Visit (AMBULATORY_SURGERY_CENTER): Payer: Medicare HMO | Admitting: Gastroenterology

## 2020-10-21 ENCOUNTER — Other Ambulatory Visit: Payer: Self-pay

## 2020-10-21 ENCOUNTER — Encounter: Payer: Self-pay | Admitting: Gastroenterology

## 2020-10-21 VITALS — BP 130/81 | HR 60 | Temp 96.9°F | Resp 16 | Ht 72.0 in | Wt 192.0 lb

## 2020-10-21 DIAGNOSIS — Z1211 Encounter for screening for malignant neoplasm of colon: Secondary | ICD-10-CM | POA: Diagnosis not present

## 2020-10-21 DIAGNOSIS — D128 Benign neoplasm of rectum: Secondary | ICD-10-CM | POA: Diagnosis not present

## 2020-10-21 DIAGNOSIS — K635 Polyp of colon: Secondary | ICD-10-CM | POA: Diagnosis not present

## 2020-10-21 MED ORDER — SODIUM CHLORIDE 0.9 % IV SOLN: 500.0000 mL | Freq: Once | INTRAVENOUS | Status: DC

## 2020-10-21 NOTE — Progress Notes (Signed)
Called to room to assist during endoscopic procedure.  Patient ID and intended procedure confirmed with present staff. Received instructions for my participation in the procedure from the performing physician.  

## 2020-10-21 NOTE — Patient Instructions (Signed)
Handout on polyps, diverticulosis and hemorrhoids given. Use FiberCon 1-2 tablets daily. High fiber diet.    YOU HAD AN ENDOSCOPIC PROCEDURE TODAY AT [facility]:   Refer to the procedure report that was given to you for any specific questions about what was found during the examination.  If the procedure report does not answer your questions, please call your gastroenterologist to clarify.  If you requested that your care partner not be given the details of your procedure findings, then the procedure report has been included in a sealed envelope for you to review at your convenience later.  YOU SHOULD EXPECT: Some feelings of bloating in the abdomen. Passage of more gas than usual.  Walking can help get rid of the air that was put into your GI tract during the procedure and reduce the bloating. If you had a lower endoscopy (such as a colonoscopy or flexible sigmoidoscopy) you may notice spotting of blood in your stool or on the toilet paper. If you underwent a bowel prep for your procedure, you may not have a normal bowel movement for a few days.  Please Note:  You might notice some irritation and congestion in your nose or some drainage.  This is from the oxygen used during your procedure.  There is no need for concern and it should clear up in a day or so.  SYMPTOMS TO REPORT IMMEDIATELY:   Following lower endoscopy (colonoscopy or flexible sigmoidoscopy):  Excessive amounts of blood in the stool  Significant tenderness or worsening of abdominal pains  Swelling of the abdomen that is new, acute  Fever of 100F or higher   For urgent or emergent issues, a gastroenterologist can be reached at any hour by calling [phone]. Do not use MyChart messaging for urgent concerns.    DIET:  We do recommend a small meal at first, but then you may proceed to your regular diet.  Drink plenty of fluids but you should avoid alcoholic beverages for 24 hours.  ACTIVITY:  You should  plan to take it easy for the rest of today and you should NOT DRIVE or use heavy machinery until tomorrow (because of the sedation medicines used during the test).    FOLLOW UP: Our staff will call the number listed on your records 48-72 hours following your procedure to check on you and address any questions or concerns that you may have regarding the information given to you following your procedure. If we do not reach you, we will leave a message.  We will attempt to reach you two times.  During this call, we will ask if you have developed any symptoms of COVID 19. If you develop any symptoms (ie: fever, flu-like symptoms, shortness of breath, cough etc.) before then, please call [phone].  If you test positive for Covid 19 in the 2 weeks post procedure, please call and report this information to [place].    If any biopsies were taken you will be contacted by phone or by letter within the next 1-3 weeks.  Please call us at [phone] if you have not heard about the biopsies in 3 weeks.    SIGNATURES/CONFIDENTIALITY: You and/or your care partner have signed paperwork which will be entered into your electronic medical record.  These signatures attest to the fact that that the information above on your After Visit Summary has been reviewed and is understood.  Full responsibility of the confidentiality of this discharge information lies with you and/or your care-partner.

## 2020-10-21 NOTE — Progress Notes (Signed)
VS-KW  Pt's states no medical or surgical changes since previsit or office visit.

## 2020-10-21 NOTE — Progress Notes (Signed)
Pt Drowsy. VSS. To PACU, report to RN. No anesthetic complications noted.  

## 2020-10-21 NOTE — Op Note (Signed)
[facility] Patient Name: Edward [name] Procedure Date: 10/21/2020 1:31 PM MRN: [id number] Endoscopist: Justice Britain , MD Age: 70 Referring MD:  Date of Birth: 07/07/1951 Gender: Male Account #: [id number] Procedure:                Colonoscopy Indications:              Screening for colorectal malignant neoplasm Medicines:                Monitored Anesthesia Care Procedure:                Pre-Anesthesia Assessment:                           - Prior to the procedure, a History and Physical                            was performed, and patient medications and                            allergies were reviewed. The patient's tolerance of                            previous anesthesia was also reviewed. The risks                            and benefits of the procedure and the sedation                            options and risks were discussed with the patient.                            All questions were answered, and informed consent                            was obtained. Prior Anticoagulants: The patient has                            taken no previous anticoagulant or antiplatelet                            agents except for aspirin. ASA Grade Assessment: II                            - A patient with mild systemic disease. After                            reviewing the risks and benefits, the patient was                            deemed in satisfactory condition to undergo the                            procedure.  After obtaining informed consent, the colonoscope                            was passed under direct vision. Throughout the                            procedure, the patient's blood pressure, pulse, and                            oxygen saturations were monitored continuously. The                            Olympus CF-HQ190L [id number]) Colonoscope was                            introduced through the anus and advanced to the the                             cecum, identified by appendiceal orifice and                            ileocecal valve. The colonoscopy was performed                            without difficulty. The patient tolerated the                            procedure. The quality of the bowel preparation was                            adequate. The ileocecal valve, appendiceal orifice,                            and rectum were photographed. Scope In: 1:37:55 PM Scope Out: 1:54:40 PM Scope Withdrawal Time: 0 hours 11 minutes 20 seconds  Total Procedure Duration: 0 hours 16 minutes 45 seconds  Findings:                 The digital rectal exam findings include                            hemorrhoids. Pertinent negatives include no                            palpable rectal lesions.                           A moderate amount of liquid semi-liquid stool was                            found in the entire colon, interfering with                            visualization. Lavage of the area was performed  using copious amounts, resulting in clearance with                            adequate visualization.                           A 4 mm polyp was found in the rectum. The polyp was                            sessile. The polyp was removed with a cold snare.                            Resection and retrieval were complete.                           Multiple small and large-mouthed diverticula were                            found in the entire colon.                           Normal mucosa was found in the entire colon                            otherwise.                           Non-bleeding non-thrombosed internal hemorrhoids                            were found during retroflexion, during perianal                            exam and during digital exam. The hemorrhoids were                            Grade II (internal hemorrhoids that prolapse but                             reduce spontaneously). Complications:            No immediate complications. Estimated Blood Loss:     Estimated blood loss was minimal. Impression:               - Hemorrhoids found on digital rectal exam.                           - Stool in the entire examined colon - lavaged                            copiously with adequate visualization.                           - One 4 mm polyp in the rectum, removed with a cold  snare. Resected and retrieved.                           - Diverticulosis in the entire examined colon.                           - Normal mucosa in the entire examined colon                            otherwise.                           - Non-bleeding non-thrombosed internal hemorrhoids. Recommendation:           - The patient will be observed post-procedure,                            until all discharge criteria are met.                           - Discharge patient to home.                           - Patient has a contact number available for                            emergencies. The signs and symptoms of potential                            delayed complications were discussed with the                            patient. Return to normal activities tomorrow.                            Written discharge instructions were provided to the                            patient.                           - High fiber diet.                           - Use FiberCon 1-2 tablets PO daily.                           - Continue present medications.                           - Await pathology results.                           - Repeat colonoscopy in 01/03/09 years for                            surveillance based on pathology results and  findings of adenomatous tissue.                           - The findings and recommendations were discussed                            with the patient.                           - The  findings and recommendations were discussed                            with the patient's family. Justice Britain, MD 10/21/2020 2:01:34 PM

## 2020-10-23 ENCOUNTER — Telehealth: Payer: Self-pay | Admitting: *Deleted

## 2020-10-23 NOTE — Telephone Encounter (Signed)
°  Follow up Call-  Call back number 10/21/2020  Post procedure Call Back phone  # [phone]  Permission to leave phone message Yes     Patient questions:  Do you have a fever, pain , or abdominal swelling? No. Pain Score  0 *  Have you tolerated food without any problems? Yes.    Have you been able to return to your normal activities? Yes.    Do you have any questions about your discharge instructions: Diet   No. Medications  No. Follow up visit  No.  Do you have questions or concerns about your Care? No.  Actions: * If pain score is 4 or above: 1. No action needed, pain <4.Have you developed a fever since your procedure? no  2.   Have you had an respiratory symptoms (SOB or cough) since your procedure? no  3.   Have you tested positive for COVID 19 since your procedure no  4.   Have you had any family members/close contacts diagnosed with the COVID 19 since your procedure?  no   If yes to any of these questions please route to Joylene John, RN and Joella Prince, RN

## 2020-10-29 DIAGNOSIS — L570 Actinic keratosis: Secondary | ICD-10-CM | POA: Diagnosis not present

## 2020-10-29 DIAGNOSIS — L309 Dermatitis, unspecified: Secondary | ICD-10-CM | POA: Diagnosis not present

## 2020-10-31 ENCOUNTER — Encounter: Payer: Self-pay | Admitting: Gastroenterology

## 2020-11-03 ENCOUNTER — Telehealth: Payer: Self-pay | Admitting: Internal Medicine

## 2020-11-03 NOTE — Telephone Encounter (Signed)
Left message for patient to call back and schedule Medicare Annual Wellness Visit (AWV) either virtually or in office. No detailed message left  Last AWV 06/11/20 Can have calender year atena insurance  please schedule at anytime with [facility]    This should be a 45 minute visit.

## 2020-12-03 ENCOUNTER — Other Ambulatory Visit: Payer: Self-pay | Admitting: Cardiology

## 2020-12-11 ENCOUNTER — Ambulatory Visit: Payer: Medicare HMO | Admitting: Vascular Surgery

## 2020-12-21 ENCOUNTER — Encounter: Payer: Self-pay | Admitting: Nurse Practitioner

## 2020-12-30 ENCOUNTER — Other Ambulatory Visit: Payer: Self-pay | Admitting: Cardiology

## 2021-01-13 ENCOUNTER — Ambulatory Visit (INDEPENDENT_AMBULATORY_CARE_PROVIDER_SITE_OTHER): Payer: Medicare HMO | Admitting: Nurse Practitioner

## 2021-01-13 ENCOUNTER — Encounter: Payer: Self-pay | Admitting: Nurse Practitioner

## 2021-01-13 ENCOUNTER — Other Ambulatory Visit: Payer: Self-pay

## 2021-01-13 VITALS — BP 110/70 | HR 64 | Temp 98.1°F | Ht 72.0 in | Wt 186.2 lb

## 2021-01-13 DIAGNOSIS — R911 Solitary pulmonary nodule: Secondary | ICD-10-CM

## 2021-01-13 DIAGNOSIS — E7800 Pure hypercholesterolemia, unspecified: Secondary | ICD-10-CM

## 2021-01-13 DIAGNOSIS — I10 Essential (primary) hypertension: Secondary | ICD-10-CM

## 2021-01-13 DIAGNOSIS — I83893 Varicose veins of bilateral lower extremities with other complications: Secondary | ICD-10-CM | POA: Diagnosis not present

## 2021-01-13 NOTE — Progress Notes (Signed)
I,Malena Timpone,acting as a Education administrator for [name], FNP.,have documented all relevant documentation on the behalf of [name], FNP,as directed by  [name], FNP while in the presence of [name], [place].  This visit occurred during the SARS-CoV-2 public health emergency.  Safety protocols were in place, including screening questions prior to the visit, additional usage of staff PPE, and extensive cleaning of exam room while observing appropriate contact time as indicated for disinfecting solutions.  Subjective:     Patient ID: Edward [name] , male    DOB: 05/12/1951 , 70 y.o.   MRN: [id number]   Chief Complaint  Patient presents with  . Hyperlipidemia    HPI  Pt is here for chol follow up. He has no questions or concerns at the moment. He continues to follow up with Cardiology. He is now on Entresto.  He continues to think about getting the covid booster.     Past Medical History:  Diagnosis Date  . Abnormal EKG 05/29/2018   Sinus bradycardia and LAFB with TWI V4-V6, III and aVF  . Benign essential HTN 05/29/2018  . Cardiomyopathy ([facility])    a. dx by echo 07/2020.  . Dilation of aorta ([facility])   . Environmental allergies   . Fracture, ribs   . H/O: varicose veins   . Hearing loss   . Hyperlipidemia LDL goal <70   . Multiple food allergies   . Rash   . Seasonal allergies   . Sinusitis   . Stroke [facility])    s/p tpa     Family History  Problem Relation Age of Onset  . CVA Mother   . Heart failure Father        CHF  . Brain cancer Brother        GBM  . Colon polyps Neg Hx   . Colon cancer Neg Hx   . Esophageal cancer Neg Hx   . Rectal cancer Neg Hx   . Stomach cancer Neg Hx      Current Outpatient Medications:  .  aspirin EC 81 MG tablet, Take 81 mg by mouth daily. Swallow whole., Disp: , Rfl:  .  fluorouracil (EFUDEX) 5 % cream, Apply topically as needed., Disp: , Rfl:  .  metoprolol succinate (TOPROL XL) 25 MG 24 hr tablet, Take 1 tablet (25 mg total) by mouth  daily., Disp: 90 tablet, Rfl: 3 .  mupirocin ointment (BACTROBAN) 2 %, Apply 1 application topically as needed., Disp: , Rfl:  .  sildenafil (VIAGRA) 100 MG tablet, Take 1 tablet by mouth as needed. , Disp: , Rfl:  .  spironolactone (ALDACTONE) 25 MG tablet, Take 0.5 tablets (12.5 mg total) by mouth daily., Disp: 45 tablet, Rfl: 3 .  tacrolimus (PROTOPIC) 0.1 % ointment, Apply 1 application topically as needed., Disp: , Rfl:  .  sacubitril-valsartan (ENTRESTO) 24-26 MG, Take 1 tablet by mouth 2 (two) times daily., Disp: 180 tablet, Rfl: 2   No Known Allergies   Review of Systems  Constitutional: Negative.   HENT: Negative.   Eyes: Negative.   Respiratory: Negative.   Cardiovascular: Negative.  Negative for chest pain, palpitations and leg swelling.  Gastrointestinal: Negative.   Endocrine: Negative.   Genitourinary: Negative.   Musculoskeletal: Negative.   Skin: Negative.   Allergic/Immunologic: Negative.   Neurological: Negative.  Negative for dizziness and headaches.  Hematological: Negative.   Psychiatric/Behavioral: Negative.      Today's Vitals   01/13/21 1522  BP: 110/70  Pulse: 64  Temp: 98.1 F (36.7 C)  Weight: 186 lb 3.2 oz (84.5 kg)  Height: 6' (1.829 m)   Wt Readings from Last 3 Encounters:  01/13/21 186 lb 3.2 oz (84.5 kg)  10/21/20 192 lb (87.1 kg)  10/01/20 192 lb (87.1 kg)   Objective:  Physical Exam Vitals reviewed.  Constitutional:      General: He is not in acute distress.    Appearance: Normal appearance.  Cardiovascular:     Rate and Rhythm: Normal rate and regular rhythm.     Pulses: Normal pulses.     Heart sounds: Normal heart sounds. No murmur heard.   Pulmonary:     Effort: Pulmonary effort is normal. No respiratory distress.     Breath sounds: Normal breath sounds. No wheezing.  Abdominal:     General: Bowel sounds are normal. There is no distension.     Palpations: Abdomen is soft.     Tenderness: There is no abdominal tenderness.   Skin:    General: Skin is warm and dry.     Capillary Refill: Capillary refill takes less than 2 seconds.  Neurological:     General: No focal deficit present.     Mental Status: He is alert and oriented to person, place, and time.     Cranial Nerves: No cranial nerve deficit.  Psychiatric:        Mood and Affect: Mood normal.        Behavior: Behavior normal.        Thought Content: Thought content normal.        Judgment: Judgment normal.         Assessment And Plan:     1. Elevated LDL cholesterol level  Chronic, no current medications  Would like to continue working on diet and exercise - Lipid panel  2. Benign essential HTN  Well controlled  No current medications  3. Varicose veins of bilateral lower extremities with other complications  Chronic, being followed at [facility] vascular  4. Solitary pulmonary nodule  After review of his imaging studies he had in 2020 he had a 6 mm nodule to lateral left lower lobe. He has had not had a follow up CT chest, will order to recheck.    Patient was given opportunity to ask questions. Patient verbalized understanding of the plan and was able to repeat key elements of the plan. All questions were answered to their satisfaction.  [name], FNP   I, [name], FNP, have reviewed all documentation for this visit. The documentation on 01/13/21 for the exam, diagnosis, procedures, and orders are all accurate and complete.   IF YOU HAVE BEEN REFERRED TO A SPECIALIST, IT MAY TAKE 1-2 WEEKS TO SCHEDULE/PROCESS THE REFERRAL. IF YOU HAVE NOT HEARD FROM US/SPECIALIST IN TWO WEEKS, PLEASE GIVE [place] A CALL AT [phone].   THE PATIENT IS ENCOURAGED TO PRACTICE SOCIAL DISTANCING DUE TO THE COVID-19 PANDEMIC.

## 2021-01-13 NOTE — Patient Instructions (Signed)

## 2021-01-14 ENCOUNTER — Telehealth: Payer: Self-pay

## 2021-01-14 LAB — LIPID PANEL
Chol/HDL Ratio: 5 ratio (ref 0.0–5.0)
Cholesterol, Total: 204 mg/dL — ABNORMAL HIGH (ref 100–199)
HDL: 41 mg/dL (ref >39)
LDL Chol Calc (NIH): 138 mg/dL — ABNORMAL HIGH (ref 0–99)
Triglycerides: 139 mg/dL (ref 0–149)
VLDL Cholesterol Cal: 25 mg/dL (ref 5–40)

## 2021-01-29 ENCOUNTER — Other Ambulatory Visit: Payer: Self-pay | Admitting: Cardiology

## 2021-02-05 ENCOUNTER — Ambulatory Visit (INDEPENDENT_AMBULATORY_CARE_PROVIDER_SITE_OTHER): Payer: Medicare HMO

## 2021-02-05 DIAGNOSIS — E7800 Pure hypercholesterolemia, unspecified: Secondary | ICD-10-CM | POA: Diagnosis not present

## 2021-02-05 DIAGNOSIS — E782 Mixed hyperlipidemia: Secondary | ICD-10-CM

## 2021-02-05 DIAGNOSIS — I10 Essential (primary) hypertension: Secondary | ICD-10-CM | POA: Diagnosis not present

## 2021-02-05 NOTE — Progress Notes (Signed)
Chronic Care Management Pharmacy Note  02/26/2021 Name:  Edward [name] MRN:  [id number] DOB:  [birth year]  Summary: Patient assistance for [facility] pending. Patient has run out of samples from cardiologist. Patient to have statin regimen started due to elevated LDL.  Recommendations/Changes made from today's visit: Recommend patient be started on statin medication rosuvustatin 5 mg tablet every other day.  Collaborate with cardiologist team to complete patient assistance paper work for [employer].   Plan: Patient to receive samples of Entresto 24-26 mg, a total of 2 bottles 56 tablets, which will be a 28 day supply.  Lot# [id number], Expiration: 02/2021. Patient to start Rosuvastatin 5 mg tablet every other day will follow up in 6 weeks for labs.    Subjective: Edward [name] is an 70 y.o. year old male who is a primary patient of [name], [place].  The CCM team was consulted for assistance with disease management and care coordination needs.    Engaged with patient by telephone for follow up visit in response to provider referral for pharmacy case management and/or care coordination services.   Consent to Services:  The patient was given information about Chronic Care Management services, agreed to services, and gave verbal consent prior to initiation of services.  Please see initial visit note for detailed documentation.   Patient Care Team: [name], FNP as PCP - General (General Practice) Sueanne Margarita, MD as PCP - Cardiology (Cardiology) Tat, Eustace Quail, DO as Consulting Physician (Neurology) Mayford Knife, [facility] (Pharmacist)  Recent office visits: 01/13/2021 PCP OV  10/15/2020 PCP OV Recent consult visits: 10/29/2020 Dermatology [facility] visits: None in previous 6 months   Objective:  Lab Results  Component Value Date   CREATININE 1.15 09/04/2020   BUN 16 09/04/2020   GFRNONAA 65 09/04/2020   GFRAA 76 09/04/2020   NA 141 09/04/2020   K 4.4 09/04/2020    CALCIUM 9.2 09/04/2020   CO2 24 09/04/2020   GLUCOSE 91 09/04/2020    Lab Results  Component Value Date/Time   MICROALBUR 10 07/14/2020 03:58 PM    Last diabetic Eye exam: No results found for: HMDIABEYEEXA  Last diabetic Foot exam: No results found for: HMDIABFOOTEX   Lab Results  Component Value Date   CHOL 204 (H) 01/13/2021   HDL 41 01/13/2021   LDLCALC 138 (H) 01/13/2021   TRIG 139 01/13/2021   CHOLHDL 5.0 01/13/2021    Hepatic Function Latest Ref Rng & Units 08/13/2020  Total Protein 6.0 - 8.5 g/dL 6.4  ALT 0 - 44 IU/L 31    Lab Results  Component Value Date/Time   TSH 1.420 08/13/2020 11:55 AM   TSH 1.650 05/24/2019 12:42 PM    CBC Latest Ref Rng & Units 05/24/2019  WBC 3.4 - 10.8 x10E3/uL 6.3  Hemoglobin 13.0 - 17.7 g/dL 13.9  Hematocrit 37.5 - 51.0 % 42.0  Platelets 150 - 450 x10E3/uL 307    No results found for: VD25OH  Clinical ASCVD: No  The 10-year ASCVD risk score Mikey Bussing DC Jr., et al., 2013) is: 15.8%   Values used to calculate the score:     Age: 60 years     Sex: Male     Is Non-Hispanic African American: No     Diabetic: No     Tobacco smoker: No     Systolic Blood Pressure: 545 mmHg     Is BP treated: Yes     HDL Cholesterol: 41 mg/dL     Total  Cholesterol: 204 mg/dL    Depression screen [facility] 2/9 06/11/2020 09/26/2019 05/24/2019  Decreased Interest 0 0 0  Down, Depressed, Hopeless 0 0 0  PHQ - 2 Score 0 0 0  Altered sleeping - - 0  Tired, decreased energy - - 0  Change in appetite - - 0  Feeling bad or failure about yourself  - - 0  Trouble concentrating - - 0  Moving slowly or fidgety/restless - - 0  Suicidal thoughts - - 0  PHQ-9 Score - - 0     Social History   Tobacco Use  Smoking Status Former   Pack years: 0.00  Smokeless Tobacco Never  Tobacco Comments   quit 40+ years ago   BP Readings from Last 3 Encounters:  01/13/21 110/70  10/21/20 130/81  09/30/20 92/73   Pulse Readings from Last 3 Encounters:  01/13/21 64   10/21/20 60  09/30/20 65   Wt Readings from Last 3 Encounters:  01/13/21 186 lb 3.2 oz (84.5 kg)  10/21/20 192 lb (87.1 kg)  10/01/20 192 lb (87.1 kg)   BMI Readings from Last 3 Encounters:  01/13/21 25.25 kg/m  10/21/20 26.04 kg/m  10/01/20 26.04 kg/m    Assessment/Interventions: Review of patient past medical history, allergies, medications, health status, including review of consultants reports, laboratory and other test data, was performed as part of comprehensive evaluation and provision of chronic care management services.   SDOH:  (Social Determinants of Health) assessments and interventions performed: Yes  SDOH Screenings   Alcohol Screen: Not on file  Depression (PHQ2-9): Low Risk    PHQ-2 Score: 0  Financial Resource Strain: Low Risk    Difficulty of Paying Living Expenses: Not very hard  Food Insecurity: No Food Insecurity   Worried About Charity fundraiser in the Last Year: Never true   Ran Out of Food in the Last Year: Never true  Housing: Not on file  Physical Activity: Insufficiently Active   Days of Exercise per Week: 1 day   Minutes of Exercise per Session: 60 min  Social Connections: Not on file  Stress: No Stress Concern Present   Feeling of Stress : Not at all  Tobacco Use: Medium Risk   Smoking Tobacco Use: Former   Smokeless Tobacco Use: Never  Transportation Needs: No Transportation Needs   Lack of Transportation (Medical): No   Lack of Transportation (Non-Medical): No    CCM Care Plan  No Known Allergies  Medications Reviewed Today     Reviewed by Mayford Knife, RPH (Pharmacist) on 02/26/21 at 1031  Med List Status: <None>   Medication Order Taking? Sig Documenting Provider Last Dose Status Informant  aspirin EC 81 MG tablet [id number] No Take 81 mg by mouth daily. Swallow whole. [provider] Taking Active   fluorouracil (EFUDEX) 5 % cream [id number] No Apply topically as needed. [provider] Taking Active    metoprolol succinate (TOPROL XL) 25 MG 24 hr tablet [id number] No Take 1 tablet (25 mg total) by mouth daily. Charlie Pitter, PA-C Taking Active   mupirocin ointment (BACTROBAN) 2 % [id number] No Apply 1 application topically as needed. [provider] Taking Active   sacubitril-valsartan (ENTRESTO) 24-26 MG [id number]  Take 1 tablet by mouth 2 (two) times daily. Sueanne Margarita, MD  Active   sildenafil (VIAGRA) 100 MG tablet [id number] No Take 1 tablet by mouth as needed.  [provider] Taking Active   spironolactone (ALDACTONE) 25 MG tablet [id number]  No Take 0.5 tablets (12.5 mg total) by mouth daily. Sueanne Margarita, MD Taking Active   tacrolimus (PROTOPIC) 0.1 % ointment [id number] No Apply 1 application topically as needed. [provider] Taking Active             Patient Active Problem List   Diagnosis Date Noted   Mixed hyperlipidemia 06/11/2020   Solitary pulmonary nodule 08/08/2019   Diverticulitis of colon 86/57/8469   Umbilical hernia 62/95/2841   Elevated LDL cholesterol level 08/08/2019   Ascending aorta dilation ([place]) 05/24/2019   Benign head tremor 05/24/2019   Abnormal EKG 05/29/2018   Benign essential HTN 05/29/2018    Immunization History  Administered Date(s) Administered   Fluad Quad(high Dose 65+) 06/11/2020   Janssen (J&J) SARS-COV-2 Vaccination 12/29/2019   Pneumococcal Conjugate-13 05/24/2019   Pneumococcal-Unspecified 06/29/2020    Conditions to be addressed/monitored:  Hypertension and Hyperlipidemia  Care Plan : [place]  Updates made by Mayford Knife, RPH since 02/26/2021 12:00 AM     Problem: HTN, HLD,Cardiomyopathy   Priority: High  Onset Date: 10/15/2020     Long-Range Goal: Disease Management   Start Date: 10/15/2020  Recent Progress: On track  Priority: High  Note:     Current Barriers:  Unable to achieve control of cholesterol   Suboptimal therapeutic regimen for hyperlipidemia    Pharmacist Clinical Goal(s):  Over the next 90 days, patient will achieve control of cholesterol as evidenced by an LDL <99 through collaboration with PharmD and provider.  Patient to be comfortable taking Crestor 10 mg tablet every other day.   Interventions: 1:1 collaboration with Glendale Chard, MD regarding development and update of comprehensive plan of care as evidenced by provider attestation and co-signature Inter-disciplinary care team collaboration (see longitudinal plan of care) Comprehensive medication review performed; medication list updated in electronic medical record  Hypertension (BP goal <130/80) -controlled -Current treatment: Metoprolol Succinate 25 mg -taking 1 tablet by mouth daily  Entresto 24mg -26mg   - taking two times  daily  Spironolactone 25 mg taking daily  -Medications previously tried: none  -Current home readings: Patient has not been checking his BP at home as often because the readings do not seem accurate. Patient seen by the cardiologist on 10/05/2020 his readings were good.  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,000 steps per day, he goes to the gym at least once per week for an hour  -Denies hypotensive/hypertensive symptoms -Educated on BP goals and benefits of medications for prevention of heart attack, stroke and kidney damage; Daily salt intake goal < 2300 mg; Exercise goal of 150 minutes per week; Importance of home blood pressure monitoring; -Counseled to monitor BP at home once a week, document, and provide log at future appointments -Counseled on diet and exercise extensively -Patient assistance completed for [facility], patient signed and gave financial information, application faxed to cardiologist Dr. Radford Pax --Collaborated with  PCP team and patient was given a month of supply of Entresto 24-26 mg tablet on 02/26/2021 pending approval for patient assistance.  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  Collaborated with PCP team to start patient on Crestor 5 mg tablet every other day, and asses patient at six weeks to determine how well he is doing with the medication.   Cardiomyopathy -controlled -Current treatment  Entresto 24-26 mg taking 1 tablet daily  [facility] Was $40-$50 Spironolactone 25 mg take daily  Metoprolol Succinate  25 mg take daily  -Medications previously tried: none  Patient followed by Cardiology  Patient to have imaging study done in February  -Assessed patient finances. Patients will need patient assistance for Entresto.     Health Maintenance -Vaccine gaps: patient is not interested in COVID-19 Booster at this time. He does not feel like there is a benefit. Discussed the importance of vaccination due to his job that requires a lot of interaction  with people and going to the gym. I recommended that the patient be seen by  -Current therapy:  Multivitamin - taking 1 tablet daily  InstaFlex- 1 tablet daily Cinnamon-1 tablet daily  -Educated on Herbal supplement research is limited and benefits usually cannot be proven Supplements may interfere with prescription drugs -Patient is satisfied with current therapy and denies issues -Recommended patient get COVID booster, also recommended shingrix vaccine.    Patient Goals/Self-Care Activities Over the next 90 days, patient will:  - take medications as prescribed focus on medication adherence by using pill box reminder system check blood pressure at least three times per week , document, and provide at future appointments  Follow Up Plan: Telephone follow up appointment with care management team member scheduled for: approximately 3 months from now  The patient has been provided with contact information for the care management team and has been advised to call with any health related questions or concerns.       Medication Assistance: Application for Entresto  medication assistance program. in process.  Anticipated assistance start date [employer].  See plan of care for additional detail.  [name], PharmD Clinical Pharmacist [facility] [phone]

## 2021-02-09 ENCOUNTER — Telehealth: Payer: Self-pay

## 2021-02-09 NOTE — Telephone Encounter (Signed)
Taking it to the front now. Thanks Lynn,LPN

## 2021-02-09 NOTE — Telephone Encounter (Signed)
**Note De-Identified Blayklee Mable Obfuscation** The pt and I spoke aout pt asst through [employer] pt asst [employer] for his [name] and he is interested in applying. I gave him [employer] pt asst phone number to call with questions concerning their Entresto program and to request that they mail him an application to his home address if it seems that he would be eligible to be approved for the program.  The pt states that a lady name Vally at his internal care office normally helps he and his wife with applying for asst for expensive medications and wanted to know if he could apply through her. I advised that he can and that if she needs Dr Theodosia Blender signature to fax [place] the provider page of his application and that we will fill out and fax back to [facility] or he can complete his application, obtain required documents if any per [employer], and to bring all to Dr Leitha Bleak and that we will take care of theprovider page and will fax to [employer] pt asst.  He is aware that we are leaving him a 2 week supply of Entresto samples in the front office at Dr Landis Gandy office for him to pick up.  He states he will pick up today at a little after 4 pm.

## 2021-02-09 NOTE — Telephone Encounter (Signed)
Pt calling requesting samples of entresto. Pt states that the medication is to expensive. Jeani Hawking, LPN, can you please advise on this matter? Thanks

## 2021-02-10 ENCOUNTER — Telehealth: Payer: Self-pay

## 2021-02-10 NOTE — Chronic Care Management (AMB) (Signed)
    Chronic Care Management Pharmacy Assistant   Name: Elex Mainwaring  MRN: [id number] DOB: 12-26-1950   Reason for Encounter: Patient Assistance Coordination   02/10/2021- Filled out prescriber page for [name] with [employer] patient assistance program. Faxing to Cardiologist Dr. Fransico Him to sign and return to PCP office so we can fax to [employer] to help patient with cost of this medication.   Medications: Outpatient Encounter Medications as of 02/10/2021  Medication Sig   aspirin EC 81 MG tablet Take 81 mg by mouth daily. Swallow whole.   fluorouracil (EFUDEX) 5 % cream Apply topically as needed.   metoprolol succinate (TOPROL XL) 25 MG 24 hr tablet Take 1 tablet (25 mg total) by mouth daily.   mupirocin ointment (BACTROBAN) 2 % Apply 1 application topically as needed.   sacubitril-valsartan (ENTRESTO) 24-26 MG Take 1 tablet by mouth 2 (two) times daily.   sildenafil (VIAGRA) 100 MG tablet Take 1 tablet by mouth as needed.    spironolactone (ALDACTONE) 25 MG tablet Take 0.5 tablets (12.5 mg total) by mouth daily.   tacrolimus (PROTOPIC) 0.1 % ointment Apply 1 application topically as needed.   No facility-administered encounter medications on file as of 02/10/2021.    Star Rating Drugs: Entresto 24/26 mg- Last filled 12/30/2020 [facility].   SIG: [name], [place] Pharmacist Assistant [phone]

## 2021-02-26 NOTE — Patient Instructions (Addendum)
Visit Information It was great speaking with you today!  Please let me know if you have any questions about our visit.   Goals Addressed             This Visit's Progress    Manage My Medicine       Timeframe:  Long-Range Goal Priority:  High Start Date:                             Expected End Date:                       Follow Up in 4 months   - call for medicine refill 2 or 3 days before it runs out - keep a list of all the medicines I take; vitamins and herbals too - use a pillbox to sort medicine    Why is this important?   These steps will help you keep on track with your medicines.            Patient Care Plan: CCM Pharmacy Care Plan     Problem Identified: HTN, HLD,Cardiomyopathy   Priority: High  Onset Date: 10/15/2020     Long-Range Goal: Disease Management   Start Date: 10/15/2020  Recent Progress: On track  Priority: High  Note:     Current Barriers:  Unable to achieve control of cholesterol   Suboptimal therapeutic regimen for hyperlipidemia   Pharmacist Clinical Goal(s):  Over the next 90 days, patient will achieve control of cholesterol as evidenced by an LDL <99 through collaboration with PharmD and provider.  Patient to be comfortable taking Crestor 10 mg tablet every other day.   Interventions: 1:1 collaboration with Glendale Chard, MD regarding development and update of comprehensive plan of care as evidenced by provider attestation and co-signature Inter-disciplinary care team collaboration (see longitudinal plan of care) Comprehensive medication review performed; medication list updated in electronic medical record  Hypertension (BP goal <130/80) -controlled -Current treatment: Metoprolol Succinate 25 mg -taking 1 tablet by mouth daily  Entresto 24mg -26mg   - taking two times  daily  Spironolactone 25 mg taking daily  -Medications previously tried: none  -Current home readings: Patient has not been checking his BP at home as often  because the readings do not seem accurate. Patient seen by the cardiologist on 10/05/2020 his readings were good.  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,000 steps per day, he goes to the gym at least once per week for an hour  -Denies hypotensive/hypertensive symptoms -Educated on BP goals and benefits of medications for prevention of heart attack, stroke and kidney damage; Daily salt intake goal < 2300 mg; Exercise goal of 150 minutes per week; Importance of home blood pressure monitoring; -Counseled to monitor BP at home once a week, document, and provide log at future appointments -Counseled on diet and exercise extensively -Patient assistance completed for [facility], patient signed and gave financial information, application faxed to cardiologist Dr. Radford Pax --Collaborated with PCP team and patient was given a month of supply of Entresto 24-26 mg tablet on 02/26/2021 pending approval for patient assistance.  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  Collaborated with PCP team to start patient on Crestor 5 mg tablet every other day, and asses patient at six weeks to determine how well he is doing with the medication.   Cardiomyopathy -controlled -Current treatment  Entresto 24-26 mg taking 1 tablet daily  [facility] $40-$50 Spironolactone 25 mg take daily  Metoprolol Succinate  25 mg take daily  -Medications previously tried: none  Patient followed by Cardiology  Patient to have imaging study done in February  -Assessed patient finances. Patients will need patient assistance for Entresto.     Health Maintenance -Vaccine gaps: patient is not interested in COVID-19 Booster at this time. He does not feel like there is a benefit. Discussed the importance of vaccination due to his job that requires a lot of interaction with people and going to the gym. I recommended that the patient be seen by  -Current therapy:  Multivitamin - taking 1 tablet daily  InstaFlex- 1 tablet daily Cinnamon-1 tablet daily  -Educated on Herbal supplement research is limited and benefits usually cannot be proven Supplements may interfere with prescription drugs -Patient is satisfied with current therapy and denies issues -Recommended patient get COVID booster, also recommended shingrix vaccine.    Patient Goals/Self-Care Activities Over the next 90 days, patient will:  - take medications as prescribed focus on medication adherence by using pill box reminder system check blood pressure at least three times per week , document, and provide at future appointments  Follow Up Plan: Telephone follow up appointment with care management team member scheduled for: approximately 3 months from now  The patient has been provided with  contact information for the care management team and has been advised to call with any health related questions or concerns.       Patient agreed to services and verbal consent obtained.   Print copy of patient instructions, educational materials, and care plan provided in person.  [name], PharmD Clinical Pharmacist [facility] [phone]

## 2021-03-12 ENCOUNTER — Telehealth: Payer: Self-pay

## 2021-03-12 NOTE — Chronic Care Management (AMB) (Addendum)
Chronic Care Management Pharmacy Assistant   Name: Edward [name]  MRN: [id number] DOB: 23-Mar-1951   Reason for Encounter: Disease State/ Hypertension  Recent office visits:  None  Recent consult visits:  None  Hospital visits:  None in previous 6 months  Medications: Outpatient Encounter Medications as of 03/12/2021  Medication Sig   aspirin EC 81 MG tablet Take 81 mg by mouth daily. Swallow whole.   fluorouracil (EFUDEX) 5 % cream Apply topically as needed.   metoprolol succinate (TOPROL XL) 25 MG 24 hr tablet Take 1 tablet (25 mg total) by mouth daily.   mupirocin ointment (BACTROBAN) 2 % Apply 1 application topically as needed.   sacubitril-valsartan (ENTRESTO) 24-26 MG Take 1 tablet by mouth 2 (two) times daily.   sildenafil (VIAGRA) 100 MG tablet Take 1 tablet by mouth as needed.    spironolactone (ALDACTONE) 25 MG tablet Take 0.5 tablets (12.5 mg total) by mouth daily.   tacrolimus (PROTOPIC) 0.1 % ointment Apply 1 application topically as needed.   No facility-administered encounter medications on file as of 03/12/2021.   Reviewed chart prior to disease state call. Spoke with patient regarding BP  Recent Office Vitals: BP Readings from Last 3 Encounters:  01/13/21 110/70  10/21/20 130/81  09/30/20 92/73   Pulse Readings from Last 3 Encounters:  01/13/21 64  10/21/20 60  09/30/20 65    Wt Readings from Last 3 Encounters:  01/13/21 186 lb 3.2 oz (84.5 kg)  10/21/20 192 lb (87.1 kg)  10/01/20 192 lb (87.1 kg)     Kidney Function Lab Results  Component Value Date/Time   CREATININE 1.15 09/04/2020 04:48 PM   CREATININE 1.13 08/13/2020 11:55 AM   GFRNONAA 65 09/04/2020 04:48 PM   GFRAA 76 09/04/2020 04:48 PM    BMP Latest Ref Rng & Units 09/04/2020 08/13/2020 07/11/2019  Glucose 65 - 99 mg/dL 91 92 82  BUN 8 - 27 mg/dL 16 15 21   Creatinine 0.76 - 1.27 mg/dL 1.15 1.13 1.20  BUN/Creat Ratio 10 - 24 14 13 18   Sodium 134 - 144 mmol/L 141 137 141  Potassium  3.5 - 5.2 mmol/L 4.4 4.8 4.4  Chloride 96 - 106 mmol/L 104 100 98  CO2 20 - 29 mmol/L 24 26 24   Calcium 8.6 - 10.2 mg/dL 9.2 9.0 9.4    Current antihypertensive regimen:  Metoprolol Succinate 25 mg  daily Entresto 24mg -26mg  twice daily Spironolactone 25 mg daily  How often are you checking your Blood Pressure? 3-5x per week  Current home BP readings: 105/70  What recent interventions/DTPs have been made by any provider to improve Blood Pressure control since last CPP Visit:  Educated on BP goals and benefits of medications for prevention of heart attack, stroke and kidney damage Daily salt intake goal < 2300 mg  Any recent hospitalizations or ED visits since last visit with CPP? No  What diet changes have been made to improve Blood Pressure Control?  Patient states everything is still the same from last CPP visit. Patient states he usually eats cereal for breakfast, gyro and fries for lunch, and a home cooked meal for dinner and vegetables.  What exercise is being done to improve your Blood Pressure Control?  Patient states everything is still the same from last CPP visit. 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  Adherence Review: Is the patient currently on ACE/ARB medication? Yes Does the patient have >5 day gap between last estimated fill dates? No  NOTES: 03-12-2021: Called [employer] patient assistance to follow up on patient's application for entresto and was told by first representative that [employer] doesn't cover medication. Representative transferred me to an [place] representative who said [employer] DOES cover entresto then transferred me back to [employer]. Representative told me patient's application wasn't in system. Informed Edward [name] CPP and Edward [name] PTM.  03-27-2021: Called patient's cardiology office to follow up with patient's application  for entresto. Spoke with Edward [name] and she stated she has sent a message to the nurse to follow up. Called [employer] to confirm if application was received and they stated no. Informed patient that it will probably be easier to pick application up from [facility] and drop it off to cardiologist some time next week. Patient also states he will need more samples. Sent message to [facility] CMA.  Care Gaps: Shingrix overdue Covid booster overdue Medicare wellness 06-17-2021 RAF= 0.5 %  Star Rating Drugs: Entresto 24-26 mg- Patient assistance (Patient is still using samples)  [place] Pharmacist Assistant [phone]

## 2021-03-24 ENCOUNTER — Ambulatory Visit
Admission: RE | Admit: 2021-03-24 | Discharge: 2021-03-24 | Disposition: A | Discharge status: Home / Self Care | Payer: Medicare HMO | Source: Ambulatory Visit | Attending: Nurse Practitioner | Admitting: Nurse Practitioner

## 2021-03-24 DIAGNOSIS — R911 Solitary pulmonary nodule: Secondary | ICD-10-CM | POA: Diagnosis not present

## 2021-03-27 ENCOUNTER — Telehealth: Payer: Self-pay | Admitting: Cardiology

## 2021-03-27 NOTE — Telephone Encounter (Signed)
Spoke with [facility] with [facility] who states that they faxed over a patient assistant form for the patient that was filled out and needed to be signed by Dr. Radford Pax. Advised her that we did not receive the fax. She is going to have the patient come and pick up the form from them on Monday and drop it off at our office.

## 2021-03-27 NOTE — Telephone Encounter (Signed)
Left message for patient to call back. I do not see that we have received forms from the patient to be filled out by Dr. Radford Pax.

## 2021-03-27 NOTE — Telephone Encounter (Signed)
Pt c/o medication issue: 1. Name of Medication: Entresto  2. How are you currently taking this medication (dosage and times per day)? BID 3. Are you having a reaction (difficulty breathing--STAT)?  No  4. What is your medication issue? Patient assistant form should be sent to [employer] at it was sent around the 10 th or the 29 th  of June and they have not received

## 2021-05-06 ENCOUNTER — Telehealth: Payer: Self-pay

## 2021-05-06 NOTE — Chronic Care Management (AMB) (Signed)
Chronic Care Management Pharmacy Assistant   Name: Edward [name]  MRN: [id number] DOB: [birth year]  Reason for Encounter: Disease State/ Hypertension  Recent office visits:  None  Recent consult visits:  None  Hospital visits:  None in previous 6 months  Medications: Outpatient Encounter Medications as of 05/06/2021  Medication Sig   aspirin EC 81 MG tablet Take 81 mg by mouth daily. Swallow whole.   fluorouracil (EFUDEX) 5 % cream Apply topically as needed.   metoprolol succinate (TOPROL XL) 25 MG 24 hr tablet Take 1 tablet (25 mg total) by mouth daily.   mupirocin ointment (BACTROBAN) 2 % Apply 1 application topically as needed.   sacubitril-valsartan (ENTRESTO) 24-26 MG Take 1 tablet by mouth 2 (two) times daily.   sildenafil (VIAGRA) 100 MG tablet Take 1 tablet by mouth as needed.    spironolactone (ALDACTONE) 25 MG tablet Take 0.5 tablets (12.5 mg total) by mouth daily.   tacrolimus (PROTOPIC) 0.1 % ointment Apply 1 application topically as needed.   No facility-administered encounter medications on file as of 05/06/2021.   Reviewed chart prior to disease state call. Spoke with patient regarding BP  Recent Office Vitals: BP Readings from Last 3 Encounters:  01/13/21 110/70  10/21/20 130/81  09/30/20 92/73   Pulse Readings from Last 3 Encounters:  01/13/21 64  10/21/20 60  09/30/20 65    Wt Readings from Last 3 Encounters:  01/13/21 186 lb 3.2 oz (84.5 kg)  10/21/20 192 lb (87.1 kg)  10/01/20 192 lb (87.1 kg)     Kidney Function Lab Results  Component Value Date/Time   CREATININE 1.15 09/04/2020 04:48 PM   CREATININE 1.13 08/13/2020 11:55 AM   GFRNONAA 65 09/04/2020 04:48 PM   GFRAA 76 09/04/2020 04:48 PM    BMP Latest Ref Rng & Units 09/04/2020 08/13/2020 07/11/2019  Glucose 65 - 99 mg/dL 91 92 82  BUN 8 - 27 mg/dL '16 15 21  '$ Creatinine 0.76 - 1.27 mg/dL 1.15 1.13 1.20  BUN/Creat Ratio 10 - '24 14 13 18  '$ Sodium 134 - 144 mmol/L 141 137 141  Potassium 3.5 -  5.2 mmol/L 4.4 4.8 4.4  Chloride 96 - 106 mmol/L 104 100 98  CO2 20 - 29 mmol/L '24 26 24  '$ Calcium 8.6 - 10.2 mg/dL 9.2 9.0 9.4    Current antihypertensive regimen:  Metoprolol Succinate 25 mg  daily Entresto '24mg'$ -'26mg'$  twice daily (Patient reported being off of medication for a month) Spironolactone 25 mg daily  How often are you checking your Blood Pressure? 3-5x per week Current home BP readings: 134/83  What recent interventions/DTPs have been made by any provider to improve Blood Pressure control since last CPP Visit:  Educated on BP goals and benefits of medications for prevention of heart attack, stroke and kidney damage Daily salt intake goal < 2300 mg  Any recent hospitalizations or ED visits since last visit with CPP? No  What diet changes have been made to improve Blood Pressure Control?  Patient states everything is still the same from last CPP visit. Patient states he usually eats cereal for breakfast, gyro and fries for lunch, and a home cooked meal for dinner and vegetables.  What exercise is being done to improve your Blood Pressure Control?  Patient states everything is still the same from last CPP visit. 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  Adherence Review: Is the patient currently on ACE/ARB medication? No Does the patient have >5 day gap between last estimated fill dates? No  Care Gaps: Shingrix overdue Covid booster overdue Medicare wellness 06-17-2021 RAF= 0.5 %  Star Rating Drugs: Entresto 24-26 mg- Patient assistance (Patient is still using samples). Patient stated he has been off of medication for a month and is doing well.  [place] Pharmacist Assistant [phone]

## 2021-05-27 ENCOUNTER — Telehealth: Payer: Self-pay

## 2021-05-27 NOTE — Chronic Care Management (AMB) (Signed)
Spoke with patient earlier this month and patient stated he stopped Entresto and has been doing fine. [name] CPP wanted to follow up on medication and I informed her that patient stopped entresto. [name] asked me to contact patient's cardiologist since medication is for CHF and it's probably best for patient to take medication. Called [employer] to see if application was sent in. Representative informed me that application was never received. Called patient to relay message from [name] that medication was for CHF, has he spoken to his cardiologist about stopping medication and was application ever signed .Patient stated he has not told cardiologist, application was never signed, he will resume taking the medication and get paperwork filled out. Patient stated he had some samples left.  [place] Pharmacist Assistant [phone]

## 2021-06-16 NOTE — Telephone Encounter (Signed)
Edward [name] dropped off his forms today. He requested more samples and said he has enough to last him the week.  We let him know that we would confirm whether we have more sample that we can allocate for him and give you a call by the end of this week with an update one way or the other.

## 2021-06-17 ENCOUNTER — Telehealth: Payer: Self-pay

## 2021-06-17 ENCOUNTER — Encounter: Payer: Self-pay | Admitting: Nurse Practitioner

## 2021-06-17 ENCOUNTER — Other Ambulatory Visit: Payer: Self-pay

## 2021-06-17 ENCOUNTER — Ambulatory Visit (INDEPENDENT_AMBULATORY_CARE_PROVIDER_SITE_OTHER): Payer: Medicare HMO

## 2021-06-17 ENCOUNTER — Ambulatory Visit (INDEPENDENT_AMBULATORY_CARE_PROVIDER_SITE_OTHER): Payer: Medicare HMO | Admitting: Nurse Practitioner

## 2021-06-17 VITALS — BP 102/68 | HR 69 | Temp 97.0°F | Ht 72.0 in | Wt 197.0 lb

## 2021-06-17 DIAGNOSIS — Z0000 Encounter for general adult medical examination without abnormal findings: Secondary | ICD-10-CM | POA: Diagnosis not present

## 2021-06-17 DIAGNOSIS — E782 Mixed hyperlipidemia: Secondary | ICD-10-CM

## 2021-06-17 DIAGNOSIS — I10 Essential (primary) hypertension: Secondary | ICD-10-CM | POA: Diagnosis not present

## 2021-06-17 DIAGNOSIS — I429 Cardiomyopathy, unspecified: Secondary | ICD-10-CM

## 2021-06-17 NOTE — Patient Instructions (Addendum)
Edward [name] , Thank you for taking time to come for your Medicare Wellness Visit. I appreciate your ongoing commitment to your health goals. Please review the following plan we discussed and let me know if I can assist you in the future.   Screening recommendations/referrals: Colonoscopy: completed 10/21/2020 Recommended yearly ophthalmology/optometry visit for glaucoma screening and checkup Recommended yearly dental visit for hygiene and checkup  Vaccinations: Influenza vaccine: decline Pneumococcal vaccine: due Tdap vaccine: decline Shingles vaccine: discussed   Covid-19:  12/29/2019  Advanced directives: Advance directive discussed with you today. I have provided a copy for you to complete at home and have notarized. Once this is complete please bring a copy in to our office so we can scan it into your chart.  Conditions/risks identified: none  Next appointment: Follow up in one year for your annual wellness visit.   Preventive Care 29 Years and Older, Male Preventive care refers to lifestyle choices and visits with your health care provider that can promote health and wellness. What does preventive care include? A yearly physical exam. This is also called an annual well check. Dental exams once or twice a year. Routine eye exams. Ask your health care provider how often you should have your eyes checked. Personal lifestyle choices, including: Daily care of your teeth and gums. Regular physical activity. Eating a healthy diet. Avoiding tobacco and drug use. Limiting alcohol use. Practicing safe sex. Taking low doses of aspirin every day. Taking vitamin and mineral supplements as recommended by your health care provider. What happens during an annual well check? The services and screenings done by your health care provider during your annual well check will depend on your age, overall health, lifestyle risk factors, and family history of disease. Counseling  Your health care  provider may ask you questions about your: Alcohol use. Tobacco use. Drug use. Emotional well-being. Home and relationship well-being. Sexual activity. Eating habits. History of falls. Memory and ability to understand (cognition). Work and work Statistician. Screening  You may have the following tests or measurements: Height, weight, and BMI. Blood pressure. Lipid and cholesterol levels. These may be checked every 5 years, or more frequently if you are over 31 years old. Skin check. Lung cancer screening. You may have this screening every year starting at age 65 if you have a 30-pack-year history of smoking and currently smoke or have quit within the past 15 years. Fecal occult blood test (FOBT) of the stool. You may have this test every year starting at age 20. Flexible sigmoidoscopy or colonoscopy. You may have a sigmoidoscopy every 5 years or a colonoscopy every 10 years starting at age 63. Prostate cancer screening. Recommendations will vary depending on your family history and other risks. Hepatitis C blood test. Hepatitis B blood test. Sexually transmitted disease (STD) testing. Diabetes screening. This is done by checking your blood sugar (glucose) after you have not eaten for a while (fasting). You may have this done every 1-3 years. Abdominal aortic aneurysm (AAA) screening. You may need this if you are a current or former smoker. Osteoporosis. You may be screened starting at age 3 if you are at high risk. Talk with your health care provider about your test results, treatment options, and if necessary, the need for more tests. Vaccines  Your health care provider may recommend certain vaccines, such as: Influenza vaccine. This is recommended every year. Tetanus, diphtheria, and acellular pertussis (Tdap, Td) vaccine. You may need a Td booster every 10 years. Zoster vaccine. You  may need this after age 18. Pneumococcal 13-valent conjugate (PCV13) vaccine. One dose is  recommended after age 55. Pneumococcal polysaccharide (PPSV23) vaccine. One dose is recommended after age 45. Talk to your health care provider about which screenings and vaccines you need and how often you need them. This information is not intended to replace advice given to you by your health care provider. Make sure you discuss any questions you have with your health care provider. Document Released: 09/12/2015 Document Revised: 05/05/2016 Document Reviewed: 06/17/2015 Elsevier Interactive Patient Education  2017 [place] Prevention in the Home Falls can cause injuries. They can happen to people of all ages. There are many things you can do to make your home safe and to help prevent falls. What can I do on the outside of my home? Regularly fix the edges of walkways and driveways and fix any cracks. Remove anything that might make you trip as you walk through a door, such as a raised step or threshold. Trim any bushes or trees on the path to your home. Use bright outdoor lighting. Clear any walking paths of anything that might make someone trip, such as rocks or tools. Regularly check to see if handrails are loose or broken. Make sure that both sides of any steps have handrails. Any raised decks and porches should have guardrails on the edges. Have any leaves, snow, or ice cleared regularly. Use sand or salt on walking paths during winter. Clean up any spills in your garage right away. This includes oil or grease spills. What can I do in the bathroom? Use night lights. Install grab bars by the toilet and in the tub and shower. Do not use towel bars as grab bars. Use non-skid mats or decals in the tub or shower. If you need to sit down in the shower, use a plastic, non-slip stool. Keep the floor dry. Clean up any water that spills on the floor as soon as it happens. Remove soap buildup in the tub or shower regularly. Attach bath mats securely with double-sided non-slip rug  tape. Do not have throw rugs and other things on the floor that can make you trip. What can I do in the bedroom? Use night lights. Make sure that you have a light by your bed that is easy to reach. Do not use any sheets or blankets that are too big for your bed. They should not hang down onto the floor. Have a firm chair that has side arms. You can use this for support while you get dressed. Do not have throw rugs and other things on the floor that can make you trip. What can I do in the kitchen? Clean up any spills right away. Avoid walking on wet floors. Keep items that you use a lot in easy-to-reach places. If you need to reach something above you, use a strong step stool that has a grab bar. Keep electrical cords out of the way. Do not use floor polish or wax that makes floors slippery. If you must use wax, use non-skid floor wax. Do not have throw rugs and other things on the floor that can make you trip. What can I do with my stairs? Do not leave any items on the stairs. Make sure that there are handrails on both sides of the stairs and use them. Fix handrails that are broken or loose. Make sure that handrails are as long as the stairways. Check any carpeting to make sure that it is firmly  attached to the stairs. Fix any carpet that is loose or worn. Avoid having throw rugs at the top or bottom of the stairs. If you do have throw rugs, attach them to the floor with carpet tape. Make sure that you have a light switch at the top of the stairs and the bottom of the stairs. If you do not have them, ask someone to add them for you. What else can I do to help prevent falls? Wear shoes that: Do not have high heels. Have rubber bottoms. Are comfortable and fit you well. Are closed at the toe. Do not wear sandals. If you use a stepladder: Make sure that it is fully opened. Do not climb a closed stepladder. Make sure that both sides of the stepladder are locked into place. Ask someone to  hold it for you, if possible. Clearly mark and make sure that you can see: Any grab bars or handrails. First and last steps. Where the edge of each step is. Use tools that help you move around (mobility aids) if they are needed. These include: Canes. Walkers. Scooters. Crutches. Turn on the lights when you go into a dark area. Replace any light bulbs as soon as they burn out. Set up your furniture so you have a clear path. Avoid moving your furniture around. If any of your floors are uneven, fix them. If there are any pets around you, be aware of where they are. Review your medicines with your doctor. Some medicines can make you feel dizzy. This can increase your chance of falling. Ask your doctor what other things that you can do to help prevent falls. This information is not intended to replace advice given to you by your health care provider. Make sure you discuss any questions you have with your health care provider. Document Released: 06/12/2009 Document Revised: 01/22/2016 Document Reviewed: 09/20/2014 Elsevier Interactive Patient Education  2017 [employer].

## 2021-06-17 NOTE — Telephone Encounter (Signed)
**Note De-Identified [place] Obfuscation** The pt is aware that we are leaving him 2 weeks of Entresto 24-26 mg samples in the front office at Dr Landis Gandy office at [facility] on [address] in [place] for him to pick up. The pt thanked me for our assistance.

## 2021-06-17 NOTE — Progress Notes (Signed)
This visit occurred during the SARS-CoV-2 public health emergency.  Safety protocols were in place, including screening questions prior to the visit, additional usage of staff PPE, and extensive cleaning of exam room while observing appropriate contact time as indicated for disinfecting solutions.  Subjective:   Edward [name] is a 70 y.o. male who presents for Medicare Annual/Subsequent preventive examination.  Review of Systems     Cardiac Risk Factors include: advanced age (>46men, >39 women);dyslipidemia;hypertension;male gender     Objective:    Today's Vitals   06/17/21 1517  BP: 102/68  Pulse: 69  Temp: (!) 97 F (36.1 C)  TempSrc: Oral  Weight: 197 lb (89.4 kg)  Height: 6' (1.829 m)   Body mass index is 26.72 kg/m.  Advanced Directives 06/17/2021 06/11/2020 05/24/2019  Does Patient Have a Medical Advance Directive? No No No  Would patient like information on creating a medical advance directive? Yes (MAU/Ambulatory/Procedural Areas - Information given) No - Patient declined Yes (MAU/Ambulatory/Procedural Areas - Information given)    Current Medications (verified) Outpatient Encounter Medications as of 06/17/2021  Medication Sig   aspirin EC 81 MG tablet Take 81 mg by mouth daily. Swallow whole.   fluorouracil (EFUDEX) 5 % cream Apply topically as needed.   metoprolol succinate (TOPROL XL) 25 MG 24 hr tablet Take 1 tablet (25 mg total) by mouth daily.   mupirocin ointment (BACTROBAN) 2 % Apply 1 application topically as needed.   sacubitril-valsartan (ENTRESTO) 24-26 MG Take 1 tablet by mouth 2 (two) times daily.   sildenafil (VIAGRA) 100 MG tablet Take 1 tablet by mouth as needed.  (Patient not taking: Reported on 06/17/2021)   spironolactone (ALDACTONE) 25 MG tablet Take 0.5 tablets (12.5 mg total) by mouth daily.   tacrolimus (PROTOPIC) 0.1 % ointment Apply 1 application topically as needed.   No facility-administered encounter medications on file as of 06/17/2021.     Allergies (verified) Patient has no known allergies.   History: Past Medical History:  Diagnosis Date   Abnormal EKG 05/29/2018   Sinus bradycardia and LAFB with TWI V4-V6, III and aVF   Benign essential HTN 05/29/2018   Cardiomyopathy ([place])    a. dx by echo 07/2020.   Dilation of aorta (HCC)    Environmental allergies    Fracture, ribs    H/O: varicose veins    Hearing loss    Hyperlipidemia LDL goal <70    Multiple food allergies    Rash    Seasonal allergies    Sinusitis    Stroke [facility])    s/p tpa   Past Surgical History:  Procedure Laterality Date   COLONOSCOPY     INGUINAL HERNIA REPAIR Left    right hand surgery     Family History  Problem Relation Age of Onset   CVA Mother    Heart failure Father        CHF   Brain cancer Brother        GBM   Colon polyps Neg Hx    Colon cancer Neg Hx    Esophageal cancer Neg Hx    Rectal cancer Neg Hx    Stomach cancer Neg Hx    Social History   Socioeconomic History   Marital status: Married    Spouse name: Not on file   Number of children: Not on file   Years of education: Not on file   Highest education level: Not on file  Occupational History   Occupation: driver    Comment:  [employer]  Tobacco Use   Smoking status: Former   Smokeless tobacco: Never   Tobacco comments:    quit 40+ years ago  Vaping Use   Vaping Use: Never used  Substance and Sexual Activity   Alcohol use: Yes    Comment: 2 times per week   Drug use: Never   Sexual activity: Yes  Other Topics Concern   Not on file  Social History Narrative   Not on file   Social Determinants of Health   Financial Resource Strain: Low Risk    Difficulty of Paying Living Expenses: Not hard at all  Food Insecurity: No Food Insecurity   Worried About Charity fundraiser in the Last Year: Never true   [place] in the Last Year: Never true  Transportation Needs: No Transportation Needs   Lack of Transportation (Medical): No   Lack of  Transportation (Non-Medical): No  Physical Activity: Inactive   Days of Exercise per Week: 0 days   Minutes of Exercise per Session: 0 min  Stress: No Stress Concern Present   Feeling of Stress : Not at all  Social Connections: Not on file    Tobacco Counseling Counseling given: Not Answered Tobacco comments: quit 40+ years ago   Clinical Intake:  Pre-visit preparation completed: Yes  Pain : No/denies pain     Nutritional Status: BMI 25 -29 Overweight Nutritional Risks: None Diabetes: No  How often do you need to have someone help you when you read instructions, pamphlets, or other written materials from your doctor or pharmacy?: 1 - Never What is the last grade level you completed in school?: college  Diabetic? no  Interpreter Needed?: No  Information entered by :: NAllen LPN   Activities of Daily Living In your present state of health, do you have any difficulty performing the following activities: 06/17/2021 06/17/2021  Hearing? Y Y  Comment tinnitus tenditis  Vision? N N  Difficulty concentrating or making decisions? N N  Walking or climbing stairs? N N  Dressing or bathing? N N  Doing errands, shopping? N N  Preparing Food and eating ? N -  Using the Toilet? N -  In the past six months, have you accidently leaked urine? N -  Do you have problems with loss of bowel control? N -  Managing your Medications? N -  Managing your Finances? N -  Housekeeping or managing your Housekeeping? N -  Some recent data might be hidden    Patient Care Team: [name], FNP as PCP - General (General Practice) Sueanne Margarita, MD as PCP - Cardiology (Cardiology) Tat, Eustace Quail, DO as Consulting Physician (Neurology) Mayford Knife, [facility] (Pharmacist)  Indicate any recent Medical Services you may have received from other than [facility] providers in the past year (date may be approximate).     Assessment:   This is a routine wellness examination for  Edward [name].  Hearing/Vision screen Vision Screening - Comments:: Regular eye exams, [facility]  Dietary issues and exercise activities discussed: Current Exercise Habits: The patient has a physically strenuous job, but has no regular exercise apart from work.   Goals Addressed             This Visit's Progress    Patient Stated       06/17/2021, wants to get back to the gym       Depression Screen PHQ 2/9 Scores 06/17/2021 06/17/2021 06/11/2020 09/26/2019 05/24/2019 09/21/2018  PHQ - 2  Score 0 0 0 0 0 0  PHQ- 9 Score - - - - 0 -    Fall Risk Fall Risk  06/17/2021 06/17/2021 06/11/2020 09/26/2019 07/31/2019  Falls in the past year? 0 0 0 0 0  Number falls in past yr: - 0 - - 0  Injury with Fall? - 0 - - 0  Risk for fall due to : Medication side effect No Fall Risks No Fall Risks - -  Follow up Falls evaluation completed;Education provided;Falls prevention discussed Falls evaluation completed Falls evaluation completed;Education provided;Falls prevention discussed - -    FALL RISK PREVENTION PERTAINING TO THE HOME:  Any stairs in or around the home? Yes  If so, are there any without handrails? No  Home free of loose throw rugs in walkways, pet beds, electrical cords, etc? Yes  Adequate lighting in your home to reduce risk of falls? Yes   ASSISTIVE DEVICES UTILIZED TO PREVENT FALLS:  Life alert? No  Use of a cane, walker or w/c? No  Grab bars in the bathroom? Yes  Shower chair or bench in shower? No  Elevated toilet seat or a handicapped toilet? No   TIMED UP AND GO:  Was the test performed? No .     Gait slow and steady without use of assistive device  Cognitive Function:     6CIT Screen 06/17/2021 06/11/2020 05/24/2019  What Year? 0 points 0 points 0 points  What month? 0 points 0 points 0 points  What time? 0 points 0 points 0 points  Count back from 20 0 points 0 points 0 points  Months in reverse 0 points 0 points 0 points  Repeat phrase 2 points 0 points 0  points  Total Score 2 0 0    Immunizations Immunization History  Administered Date(s) Administered   Fluad Quad(high Dose 65+) 06/11/2020   Janssen (J&J) SARS-COV-2 Vaccination 12/29/2019   Pneumococcal Conjugate-13 05/24/2019   Pneumococcal-Unspecified 06/29/2020    TDAP status: Due, Education has been provided regarding the importance of this vaccine. Advised may receive this vaccine at local pharmacy or Health Dept. Aware to provide a copy of the vaccination record if obtained from local pharmacy or Health Dept. Verbalized acceptance and understanding.  Flu Vaccine status: Declined, Education has been provided regarding the importance of this vaccine but patient still declined. Advised may receive this vaccine at local pharmacy or Health Dept. Aware to provide a copy of the vaccination record if obtained from local pharmacy or Health Dept. Verbalized acceptance and understanding.  Pneumococcal vaccine status: Up to date  Covid-19 vaccine status: Declined, Education has been provided regarding the importance of this vaccine but patient still declined. Advised may receive this vaccine at local pharmacy or Health Dept.or vaccine clinic. Aware to provide a copy of the vaccination record if obtained from local pharmacy or Health Dept. Verbalized acceptance and understanding.  Qualifies for Shingles Vaccine? Yes   Zostavax completed No   Shingrix Completed?: No.    Education has been provided regarding the importance of this vaccine. Patient has been advised to call insurance company to determine out of pocket expense if they have not yet received this vaccine. Advised may also receive vaccine at local pharmacy or Health Dept. Verbalized acceptance and understanding.  Screening Tests Health Maintenance  Topic Date Due   Zoster Vaccines- Shingrix (1 of 2) Never done   Pneumonia Vaccine 3+ Years old (2 - PPSV23 or PCV20) 05/23/2020   COVID-19 Vaccine (2 - Booster for [employer]  series)  07/03/2021 (Originally 02/23/2020)   INFLUENZA VACCINE  11/27/2021 (Originally 03/30/2021)   TETANUS/TDAP  01/13/2022 (Originally 11/10/1969)   COLONOSCOPY (Pts 45-67yrs Insurance coverage will need to be confirmed)  10/22/2027   Hepatitis C Screening  Completed   HPV VACCINES  Aged Out    Health Maintenance  Health Maintenance Due  Topic Date Due   Zoster Vaccines- Shingrix (1 of 2) Never done   Pneumonia Vaccine 60+ Years old (2 - PPSV23 or PCV20) 05/23/2020    Colorectal cancer screening: Type of screening: Colonoscopy. Completed 10/21/2020. Repeat every 7 years  Lung Cancer Screening: (Low Dose CT Chest recommended if Age 47-80 years, 30 pack-year currently smoking OR have quit w/in 15years.) does not qualify.   Lung Cancer Screening Referral: no  Additional Screening:  Hepatitis C Screening: does qualify; Completed 05/24/2019  Vision Screening: Recommended annual ophthalmology exams for early detection of glaucoma and other disorders of the eye. Is the patient up to date with their annual eye exam?  Yes  Who is the provider or what is the name of the office in which the patient attends annual eye exams? [facility] If pt is not established with a provider, would they like to be referred to a provider to establish care? No .   Dental Screening: Recommended annual dental exams for proper oral hygiene  Community Resource Referral / Chronic Care Management: CRR required this visit?  No   CCM required this visit?  No      Plan:     I have personally reviewed and noted the following in the patient's chart:   Medical and social history Use of alcohol, tobacco or illicit drugs  Current medications and supplements including opioid prescriptions. Patient is not currently taking opioid prescriptions. Functional ability and status Nutritional status Physical activity Advanced directives List of other physicians Hospitalizations, surgeries, and ER visits in previous 12  months Vitals Screenings to include cognitive, depression, and falls Referrals and appointments  In addition, I have reviewed and discussed with patient certain preventive protocols, quality metrics, and best practice recommendations. A written personalized care plan for preventive services as well as general preventive health recommendations were provided to patient.     Kellie Simmering, LPN   [id number]   Nurse Notes:

## 2021-06-17 NOTE — Progress Notes (Signed)
I, [place] Hamilton,acting as a Education administrator for [employer], FNP.,have documented all relevant documentation on the behalf of Edward Brine, FNP,as directed by  Edward Brine, FNP while in the presence of Edward [name], [place].   This visit occurred during the SARS-CoV-2 public health emergency.  Safety protocols were in place, including screening questions prior to the visit, additional usage of staff PPE, and extensive cleaning of exam room while observing appropriate contact time as indicated for disinfecting solutions.  Subjective:     Patient ID: Edward [name] , male    DOB: [birth year] , 70 y.o.   MRN: [id number]   Chief Complaint  Patient presents with   Hyperlipidemia    HPI  Pt presents for chol f/u. No concerns or questions at the moment.  Continues to be followed by Cardiology - he is trying to get assistance on Entresto. He is going to Dr. Theodosia Blender office today to pick up samples.     Past Medical History:  Diagnosis Date   Abnormal EKG 05/29/2018   Sinus bradycardia and LAFB with TWI V4-V6, III and aVF   Benign essential HTN 05/29/2018   Cardiomyopathy ([place])    a. dx by echo 07/2020.   Dilation of aorta (HCC)    Environmental allergies    Fracture, ribs    H/O: varicose veins    Hearing loss    Hyperlipidemia LDL goal <70    Multiple food allergies    Rash    Seasonal allergies    Sinusitis    Stroke [facility])    s/p tpa     Family History  Problem Relation Age of Onset   CVA Mother    Heart failure Father        CHF   Brain cancer Brother        GBM   Colon polyps Neg Hx    Colon cancer Neg Hx    Esophageal cancer Neg Hx    Rectal cancer Neg Hx    Stomach cancer Neg Hx      Current Outpatient Medications:    aspirin EC 81 MG tablet, Take 81 mg by mouth daily. Swallow whole., Disp: , Rfl:    fluorouracil (EFUDEX) 5 % cream, Apply topically as needed., Disp: , Rfl:    metoprolol succinate (TOPROL XL) 25 MG 24 hr tablet, Take 1 tablet (25 mg total) by mouth daily.,  Disp: 90 tablet, Rfl: 3   mupirocin ointment (BACTROBAN) 2 %, Apply 1 application topically as needed., Disp: , Rfl:    sacubitril-valsartan (ENTRESTO) 24-26 MG, Take 1 tablet by mouth 2 (two) times daily., Disp: 180 tablet, Rfl: 2   spironolactone (ALDACTONE) 25 MG tablet, Take 0.5 tablets (12.5 mg total) by mouth daily., Disp: 45 tablet, Rfl: 3   tacrolimus (PROTOPIC) 0.1 % ointment, Apply 1 application topically as needed., Disp: , Rfl:    sildenafil (VIAGRA) 100 MG tablet, Take 1 tablet by mouth as needed.  (Patient not taking: Reported on 06/17/2021), Disp: , Rfl:    No Known Allergies   Review of Systems  Constitutional: Negative.   Respiratory: Negative.    Cardiovascular: Negative.  Negative for chest pain, palpitations and leg swelling.  Neurological:  Negative for dizziness and headaches.    Today's Vitals   06/17/21 1453  BP: 102/68  Pulse: 69  Temp: (!) 97 F (36.1 C)  Weight: 197 lb (89.4 kg)  Height: 6' (1.829 m)  PainSc: 0-No pain   Body mass index is 26.72 kg/m.  Wt  Readings from Last 3 Encounters:  06/17/21 197 lb (89.4 kg)  06/17/21 197 lb (89.4 kg)  01/13/21 186 lb 3.2 oz (84.5 kg)    Objective:  Physical Exam      Assessment And Plan:     1. Mixed hyperlipidemia Comments: Good control, no current medications. Diet controlled - Lipid panel - CMP14+EGFR  2. Benign essential HTN Comments: Blood pressure is well controlled Continue current medications - CMP14+EGFR  3. Cardiomyopathy, unspecified type [facility]) Comments: He has not seen Dr. Radford Pax since January, he is advised to schedule an appt when he goes to the office today for follow up    Patient was given opportunity to ask questions. Patient verbalized understanding of the plan and was able to repeat key elements of the plan. All questions were answered to their satisfaction.  Edward Brine, FNP   I, Edward Brine, FNP, have reviewed all documentation for this visit. The documentation on  06/17/21 for the exam, diagnosis, procedures, and orders are all accurate and complete.   IF YOU HAVE BEEN REFERRED TO A SPECIALIST, IT MAY TAKE 1-2 WEEKS TO SCHEDULE/PROCESS THE REFERRAL. IF YOU HAVE NOT HEARD FROM US/SPECIALIST IN TWO WEEKS, PLEASE GIVE [place] A CALL AT [phone] X [phone].   THE PATIENT IS ENCOURAGED TO PRACTICE SOCIAL DISTANCING DUE TO THE COVID-19 PANDEMIC.

## 2021-06-17 NOTE — Patient Instructions (Signed)

## 2021-06-17 NOTE — Telephone Encounter (Signed)
**Note De-Identified Arlester Keehan Obfuscation** The pts completed [employer] pt asst application for [name] was left at the office.  I have completed the provider page of the pts application and have e-mailed all to Dr Landis Gandy nurse so she can obtain her signature, date it, and to fax to [employer] pt asst foundation at the fax number written on the cover letter included or to place in Medical Records to be faxed.

## 2021-06-18 LAB — CMP14+EGFR
ALT: 25 IU/L (ref 0–44)
AST: 25 IU/L (ref 0–40)
Albumin/Globulin Ratio: 2.1 (ref 1.2–2.2)
Albumin: 4.5 g/dL (ref 3.8–4.8)
Alkaline Phosphatase: 50 IU/L (ref 44–121)
BUN/Creatinine Ratio: 14 (ref 10–24)
BUN: 17 mg/dL (ref 8–27)
Bilirubin Total: 0.4 mg/dL (ref 0.0–1.2)
CO2: 20 mmol/L (ref 20–29)
Calcium: 9.4 mg/dL (ref 8.6–10.2)
Chloride: 99 mmol/L (ref 96–106)
Creatinine, Ser: 1.23 mg/dL (ref 0.76–1.27)
Globulin, Total: 2.1 g/dL (ref 1.5–4.5)
Glucose: 81 mg/dL (ref 70–99)
Potassium: 4.8 mmol/L (ref 3.5–5.2)
Sodium: 137 mmol/L (ref 134–144)
Total Protein: 6.6 g/dL (ref 6.0–8.5)
eGFR: 64 mL/min/{1.73_m2} (ref >59)

## 2021-06-18 LAB — LIPID PANEL
Chol/HDL Ratio: 4.9 ratio (ref 0.0–5.0)
Cholesterol, Total: 217 mg/dL — ABNORMAL HIGH (ref 100–199)
HDL: 44 mg/dL (ref >39)
LDL Chol Calc (NIH): 145 mg/dL — ABNORMAL HIGH (ref 0–99)
Triglycerides: 154 mg/dL — ABNORMAL HIGH (ref 0–149)
VLDL Cholesterol Cal: 28 mg/dL (ref 5–40)

## 2021-06-19 NOTE — Telephone Encounter (Signed)
Form has been signed and placed in medical records to be faxed.  

## 2021-06-23 ENCOUNTER — Other Ambulatory Visit: Payer: Self-pay | Admitting: Nurse Practitioner

## 2021-06-23 ENCOUNTER — Ambulatory Visit (INDEPENDENT_AMBULATORY_CARE_PROVIDER_SITE_OTHER): Payer: Medicare HMO

## 2021-06-23 DIAGNOSIS — E782 Mixed hyperlipidemia: Secondary | ICD-10-CM

## 2021-06-23 DIAGNOSIS — I10 Essential (primary) hypertension: Secondary | ICD-10-CM

## 2021-06-23 MED ORDER — ATORVASTATIN CALCIUM 10 MG PO TABS: 10.0000 mg | ORAL_TABLET | Freq: Every day | ORAL | 1 refills | Status: DC

## 2021-06-23 NOTE — Progress Notes (Signed)
Chronic Care Management Pharmacy Note  06/24/2021 Name:  Edward [name] MRN:  [id number] DOB:  1951/08/05  Summary: Patient reports that she   Recommendations/Changes made from today's visit: Recommend patient start taking atorvastatin 10 mg tablet once per day.   Plan: Patient to start taking Atorvastatin 10 mg tablet once per day.    Subjective: Edward [name] is an 70 y.o. year old male who is a primary patient of [name], [place].  The CCM team was consulted for assistance with disease management and care coordination needs.    Engaged with patient by telephone for follow up visit in response to provider referral for pharmacy case management and/or care coordination services.   Consent to Services:  The patient was given information about Chronic Care Management services, agreed to services, and gave verbal consent prior to initiation of services.  Please see initial visit note for detailed documentation.   Patient Care Team: [name], FNP as PCP - General (General Practice) Sueanne Margarita, MD as PCP - Cardiology (Cardiology) Tat, Eustace Quail, DO as Consulting Physician (Neurology) Mayford Knife, [facility] (Pharmacist)  Recent office visits: 06/17/2021 PCP OV  01/13/2021 PCP OV  Recent consult visits: 09/04/2020 Cardiology [facility] visits: None in previous 6 months   Objective:  Lab Results  Component Value Date   CREATININE 1.23 06/17/2021   BUN 17 06/17/2021   GFRNONAA 65 09/04/2020   GFRAA 76 09/04/2020   NA 137 06/17/2021   K 4.8 06/17/2021   CALCIUM 9.4 06/17/2021   CO2 20 06/17/2021   GLUCOSE 81 06/17/2021    Lab Results  Component Value Date/Time   MICROALBUR 10 07/14/2020 03:58 PM    Last diabetic Eye exam: No results found for: HMDIABEYEEXA  Last diabetic Foot exam: No results found for: HMDIABFOOTEX   Lab Results  Component Value Date   CHOL 217 (H) 06/17/2021   HDL 44 06/17/2021   LDLCALC 145 (H) 06/17/2021   TRIG 154 (H) 06/17/2021    CHOLHDL 4.9 06/17/2021    Hepatic Function Latest Ref Rng & Units 06/17/2021 08/13/2020  Total Protein 6.0 - 8.5 g/dL 6.6 6.4  Albumin 3.8 - 4.8 g/dL 4.5 -  AST 0 - 40 IU/L 25 -  ALT 0 - 44 IU/L 25 31  Alk Phosphatase 44 - 121 IU/L 50 -  Total Bilirubin 0.0 - 1.2 mg/dL 0.4 -    Lab Results  Component Value Date/Time   TSH 1.420 08/13/2020 11:55 AM   TSH 1.650 05/24/2019 12:42 PM    CBC Latest Ref Rng & Units 05/24/2019  WBC 3.4 - 10.8 x10E3/uL 6.3  Hemoglobin 13.0 - 17.7 g/dL 13.9  Hematocrit 37.5 - 51.0 % 42.0  Platelets 150 - 450 x10E3/uL 307    No results found for: VD25OH  Clinical ASCVD: No  The 10-year ASCVD risk score (Arnett DK, et al., 2019) is: 14%   Values used to calculate the score:     Age: 77 years     Sex: Male     Is Non-Hispanic African American: No     Diabetic: No     Tobacco smoker: No     Systolic Blood Pressure: 462 mmHg     Is BP treated: Yes     HDL Cholesterol: 44 mg/dL     Total Cholesterol: 217 mg/dL    Depression screen [facility] 2/9 06/17/2021 06/17/2021 06/11/2020  Decreased Interest 0 0 0  Down, Depressed, Hopeless 0 0 0  PHQ -  2 Score 0 0 0  Altered sleeping - - -  Tired, decreased energy - - -  Change in appetite - - -  Feeling bad or failure about yourself  - - -  Trouble concentrating - - -  Moving slowly or fidgety/restless - - -  Suicidal thoughts - - -  PHQ-9 Score - - -      Social History   Tobacco Use  Smoking Status Former  Smokeless Tobacco Never  Tobacco Comments   quit 40+ years ago   BP Readings from Last 3 Encounters:  06/17/21 102/68  06/17/21 102/68  01/13/21 110/70   Pulse Readings from Last 3 Encounters:  06/17/21 69  06/17/21 69  01/13/21 64   Wt Readings from Last 3 Encounters:  06/17/21 197 lb (89.4 kg)  06/17/21 197 lb (89.4 kg)  01/13/21 186 lb 3.2 oz (84.5 kg)   BMI Readings from Last 3 Encounters:  06/17/21 26.72 kg/m  06/17/21 26.72 kg/m  01/13/21 25.25 kg/m     Assessment/Interventions: Review of patient past medical history, allergies, medications, health status, including review of consultants reports, laboratory and other test data, was performed as part of comprehensive evaluation and provision of chronic care management services.   SDOH:  (Social Determinants of Health) assessments and interventions performed: No  SDOH Screenings   Alcohol Screen: Not on file  Depression (PHQ2-9): Low Risk    PHQ-2 Score: 0  Financial Resource Strain: Low Risk    Difficulty of Paying Living Expenses: Not hard at all  Food Insecurity: No Food Insecurity   Worried About Charity fundraiser in the Last Year: Never true   Ran Out of Food in the Last Year: Never true  Housing: Not on file  Physical Activity: Inactive   Days of Exercise per Week: 0 days   Minutes of Exercise per Session: 0 min  Social Connections: Not on file  Stress: No Stress Concern Present   Feeling of Stress : Not at all  Tobacco Use: Medium Risk   Smoking Tobacco Use: Former   Smokeless Tobacco Use: Never   Passive Exposure: Not on file  Transportation Needs: No Transportation Needs   Lack of Transportation (Medical): No   Lack of Transportation (Non-Medical): No    CCM Care Plan  No Known Allergies  Medications Reviewed Today     Reviewed by Kellie Simmering, LPN (Licensed Practical Nurse) on 06/17/21 at 74  Med List Status: <None>   Medication Order Taking? Sig Documenting Provider Last Dose Status Informant  aspirin EC 81 MG tablet [id number] No Take 81 mg by mouth daily. Swallow whole. [provider] Taking Active   fluorouracil (EFUDEX) 5 % cream [id number] No Apply topically as needed. [provider] Taking Active   metoprolol succinate (TOPROL XL) 25 MG 24 hr tablet [id number] No Take 1 tablet (25 mg total) by mouth daily. Charlie Pitter, PA-C Taking Active   mupirocin ointment (BACTROBAN) 2 % [id number] No Apply 1 application topically as needed.  [provider] Taking Active   sacubitril-valsartan (ENTRESTO) 24-26 MG [id number] No Take 1 tablet by mouth 2 (two) times daily. Sueanne Margarita, MD Taking Active   sildenafil (VIAGRA) 100 MG tablet [id number] No Take 1 tablet by mouth as needed.   Patient not taking: Reported on 06/17/2021   [provider] Not Taking Active   spironolactone (ALDACTONE) 25 MG tablet [id number] No Take 0.5 tablets (12.5 mg total) by mouth daily. Sueanne Margarita, MD  Taking Active   tacrolimus (PROTOPIC) 0.1 % ointment [id number] No Apply 1 application topically as needed. [provider] Taking Active             Patient Active Problem List   Diagnosis Date Noted   Mixed hyperlipidemia 06/11/2020   Solitary pulmonary nodule 08/08/2019   Diverticulitis of colon 13/03/6577   Umbilical hernia 46/96/2952   Elevated LDL cholesterol level 08/08/2019   Ascending aorta dilation ([place]) 05/24/2019   Benign head tremor 05/24/2019   Abnormal EKG 05/29/2018   Benign essential HTN 05/29/2018    Immunization History  Administered Date(s) Administered   Fluad Quad(high Dose 65+) 06/11/2020   Janssen (J&J) SARS-COV-2 Vaccination 12/29/2019   Pneumococcal Conjugate-13 05/24/2019   Pneumococcal-Unspecified 06/29/2020    Conditions to be addressed/monitored:  Hypertension and Hyperlipidemia  Care Plan : [place]  Updates made by Mayford Knife, [place] since 06/24/2021 12:00 AM     Problem: HTN, HLD   Priority: High  Onset Date: 10/15/2020     Long-Range Goal: Disease Management   Start Date: 10/15/2020  Recent Progress: On track  Priority: High  Note:   Current Barriers:  Unable to independently afford treatment regimen Unable to achieve control of cholesterol   Pharmacist Clinical Goal(s):  Patient will verbalize ability to afford treatment regimen achieve adherence to monitoring guidelines and medication adherence to achieve therapeutic efficacy achieve  control of cholesterol as evidenced by LDL through collaboration with PharmD and provider.   Interventions: 1:1 collaboration with [name], FNP regarding development and update of comprehensive plan of care as evidenced by provider attestation and co-signature Inter-disciplinary care team collaboration (see longitudinal plan of care) Comprehensive medication review performed; medication list updated in electronic medical record  Hypertension (BP goal <130/80) -Controlled -Current treatment: Spironolactone 25 mg tablet once per day  Metoprolol Succinate 25 mg tablet once per day Entresto 24-26 take 1 tablet by mouth two times per day -Current home readings: at this time patient is not currently checking his BP at home.  -Current dietary habits: Patient reports that he is avoiding fried and fatty foods.  -Current exercise habits: will discuss further during next office visit  -Denies hypotensive/hypertensive symptoms -Educated on Daily salt intake goal < 2300 mg; Exercise goal of 150 minutes per week; Importance of home blood pressure monitoring; -Counseled to monitor BP at home at least once or twice per week, document, and provide log at future appointments -Recommended to continue current medication -Patient reports that he is receiving samples of Entresto from his Cardiologist, his paperwork was completed at the [facility] 06/19/2021. It was sent to medical records to be faxed. I contacted [employer] who informed me they have not received the patients application for assistance. Spoke with PCP NP- [name] who graciously agreed to fill out paperwork if necessary to have assistance application resent. Contacted [facility] doctors office who reported completing paperwork and faxing documents to [employer].    Hyperlipidemia: (LDL goal < 70) -Uncontrolled -Current treatment: Not currently taking a statin  -Current dietary patterns: patient reports that he is avoiding fried and fatty  foods.  -He is working on eating more healthy.  -Current exercise habits: will discuss further  -Educated on Cholesterol goals;  Benefits of statin for ASCVD risk reduction; Importance of limiting foods high in cholesterol; -Collaborated with PCP team to start patient on Atorvastatin 10 mg tablet once per day. [facility]. Recpmmend patient have 8 week follow up visit  with PCP team and have lipid panel and full chem panel done including AST/ALT.   Health Maintenance -Vaccine gaps: Shingrix Vaccine, Pneumonia Vaccine, COVID 5 - Booster  -Educated on the importance of Vaccination at this time the patient is not comfortable with  Patient Goals/Self-Care Activities Patient will:  - take medications as prescribed  Follow Up Plan: The patient has been provided with contact information for the care management team and has been advised to call with any health related questions or concerns.       Medication Assistance: Application for Entresto  medication assistance program. in process.  Anticipated assistance start date 06/2021.  See plan of care for additional detail.  Compliance/Adherence/Medication fill history: Care Gaps: Shingrix Vaccine COVID-19 Vaccine   [facility]  [id number] [place] [place] - [address] [id number]: [phone]: [phone] Uses pill box? Yes Pt endorses 90% compliance  We discussed: Benefits of medication synchronization, packaging and delivery as well as enhanced pharmacist oversight with [employer]. Patient decided to: Continue current medication management strategy  Care Plan and Follow Up Patient Decision:  Patient agrees to Care Plan and Follow-up.  Plan: The patient has been provided with contact information for the care management team and has been advised to call with any health related questions or  concerns.   VOrlando Penner PharmD Clinical Pharmacist [facility] [phone]

## 2021-06-24 ENCOUNTER — Telehealth: Payer: Self-pay

## 2021-06-24 NOTE — Patient Instructions (Signed)
Visit Information It was great speaking with you today!  Please let me know if you have any questions about our visit.   Goals Addressed             This Visit's Progress    Manage My Medicine       Timeframe:  Long-Range Goal Priority:  High Start Date:      06/23/2021                       Expected End Date:  06/23/2022                     Follow Up: 08/05/2021   - call for medicine refill 2 or 3 days before it runs out - keep a list of all the medicines I take; vitamins and herbals too - use a pillbox to sort medicine    Why is this important?   These steps will help you keep on track with your medicines.           Patient Care Plan: CCM Pharmacy Care Plan     Problem Identified: HTN, HLD   Priority: High  Onset Date: 10/15/2020     Long-Range Goal: Disease Management   Start Date: 10/15/2020  Recent Progress: On track  Priority: High  Note:   Current Barriers:  Unable to independently afford treatment regimen Unable to achieve control of cholesterol   Pharmacist Clinical Goal(s):  Patient will verbalize ability to afford treatment regimen achieve adherence to monitoring guidelines and medication adherence to achieve therapeutic efficacy achieve control of cholesterol as evidenced by LDL through collaboration with PharmD and provider.   Interventions: 1:1 collaboration with [name], FNP regarding development and update of comprehensive plan of care as evidenced by provider attestation and co-signature Inter-disciplinary care team collaboration (see longitudinal plan of care) Comprehensive medication review performed; medication list updated in electronic medical record  Hypertension (BP goal <130/80) -Controlled -Current treatment: Spironolactone 25 mg tablet once per day  Metoprolol Succinate 25 mg tablet once per day Entresto 24-26 take 1 tablet by mouth two times per day -Current home readings: at this time patient is not currently checking his  BP at home.  -Current dietary habits: Patient reports that he is avoiding fried and fatty foods.  -Current exercise habits: will discuss further during next office visit  -Denies hypotensive/hypertensive symptoms -Educated on Daily salt intake goal < 2300 mg; Exercise goal of 150 minutes per week; Importance of home blood pressure monitoring; -Counseled to monitor BP at home at least once or twice per week, document, and provide log at future appointments -Recommended to continue current medication -Patient reports that he is receiving samples of Entresto from his Cardiologist, his paperwork was completed at the [facility] 06/19/2021. It was sent to medical records to be faxed. I contacted [employer] who informed me they have not received the patients application for assistance. Spoke with PCP NP- [name] who graciously agreed to fill out paperwork if necessary to have assistance application resent. Contacted [facility] doctors office who reported completing paperwork and faxing documents to [employer].    Hyperlipidemia: (LDL goal < 70) -Uncontrolled -Current treatment: Not currently taking a statin  -Current dietary patterns: patient reports that he is avoiding fried and fatty foods.  -He is working on eating more healthy.  -Current exercise habits: will discuss further  -Educated on Cholesterol goals;  Benefits of statin for ASCVD risk reduction; Importance of limiting foods  high in cholesterol; -Collaborated with PCP team to start patient on Atorvastatin 10 mg tablet once per day. [facility]. Recpmmend patient have 8 week follow up visit with PCP team and have lipid panel and full chem panel done including AST/ALT.   Health Maintenance -Vaccine gaps: Shingrix Vaccine, Pneumonia Vaccine, COVID 18 - Booster  -Educated on the importance of Vaccination at this time the patient is not comfortable with  Patient Goals/Self-Care Activities Patient will:  -  take medications as prescribed  Follow Up Plan: The patient has been provided with contact information for the care management team and has been advised to call with any health related questions or concerns.       Patient agreed to services and verbal consent obtained.   The patient verbalized understanding of instructions, educational materials, and care plan provided today and agreed to receive a mailed copy of patient instructions, educational materials, and care plan.   Edward [name], PharmD Clinical Pharmacist [facility] [phone]

## 2021-06-24 NOTE — Telephone Encounter (Signed)
Follow Up:    [name] is calling to check on the status of patient paper work for Assistance with his Edward [name]. [employer] said they have not received them.

## 2021-06-24 NOTE — Telephone Encounter (Signed)
Edward [name] from [place] is returning a call. [name] can be reached at [phone]

## 2021-06-24 NOTE — Telephone Encounter (Signed)
**Note De-Identified Tayna [place] Obfuscation** I called Vallie back at [place] but could only leave a message asking her to call Jeani Hawking back at [facility] at [phone].  I called [employer] pt asst [employer] and was advised by Sherrin Daisy that they have not received the pts application.    I have emailed the pts [employer] application to Dr Landis Gandy nurse so she can obtain Dr Landis Gandy signature, date it , and to fax all to [place] at the fax number written on the cover letter included.

## 2021-06-26 DIAGNOSIS — H5203 Hypermetropia, bilateral: Secondary | ICD-10-CM | POA: Diagnosis not present

## 2021-06-27 ENCOUNTER — Encounter: Payer: Self-pay | Admitting: Nurse Practitioner

## 2021-06-29 ENCOUNTER — Other Ambulatory Visit: Payer: Self-pay | Admitting: Nurse Practitioner

## 2021-06-29 DIAGNOSIS — E782 Mixed hyperlipidemia: Secondary | ICD-10-CM

## 2021-06-29 DIAGNOSIS — I10 Essential (primary) hypertension: Secondary | ICD-10-CM

## 2021-06-29 MED ORDER — ROSUVASTATIN CALCIUM 10 MG PO TABS: 10.0000 mg | ORAL_TABLET | Freq: Every day | ORAL | 1 refills | Status: DC

## 2021-07-02 ENCOUNTER — Encounter: Payer: Self-pay | Admitting: Nurse Practitioner

## 2021-07-02 ENCOUNTER — Ambulatory Visit (INDEPENDENT_AMBULATORY_CARE_PROVIDER_SITE_OTHER): Payer: Medicare HMO | Admitting: Nurse Practitioner

## 2021-07-02 ENCOUNTER — Other Ambulatory Visit: Payer: Self-pay

## 2021-07-02 VITALS — BP 114/70 | HR 87 | Temp 98.2°F | Ht 69.4 in | Wt 200.2 lb

## 2021-07-02 DIAGNOSIS — M545 Low back pain, unspecified: Secondary | ICD-10-CM | POA: Diagnosis not present

## 2021-07-02 DIAGNOSIS — M791 Myalgia, unspecified site: Secondary | ICD-10-CM | POA: Diagnosis not present

## 2021-07-02 DIAGNOSIS — R319 Hematuria, unspecified: Secondary | ICD-10-CM | POA: Diagnosis not present

## 2021-07-02 DIAGNOSIS — N390 Urinary tract infection, site not specified: Secondary | ICD-10-CM | POA: Diagnosis not present

## 2021-07-02 DIAGNOSIS — T466X5A Adverse effect of antihyperlipidemic and antiarteriosclerotic drugs, initial encounter: Secondary | ICD-10-CM

## 2021-07-02 LAB — POCT URINALYSIS DIPSTICK
Bilirubin, UA: NEGATIVE
Glucose, UA: NEGATIVE
Ketones, UA: NEGATIVE
Nitrite, UA: POSITIVE
Protein, UA: NEGATIVE
Spec Grav, UA: <=1.005 — AB (ref 1.010–1.025)
Urobilinogen, UA: 0.2 E.U./dL
pH, UA: 5.5 (ref 5.0–8.0)

## 2021-07-02 MED ORDER — CEFTRIAXONE SODIUM 1 G IJ SOLR
1.0000 g | Freq: Once | INTRAMUSCULAR | Status: AC
  Administered 2021-07-02: 1 g via INTRAMUSCULAR

## 2021-07-02 MED ORDER — NITROFURANTOIN MONOHYD MACRO 100 MG PO CAPS: 100.0000 mg | ORAL_CAPSULE | Freq: Two times a day (BID) | ORAL | 0 refills | Status: AC

## 2021-07-02 NOTE — Progress Notes (Signed)
This visit occurred during the SARS-CoV-2 public health emergency.  Safety protocols were in place, including screening questions prior to the visit, additional usage of staff PPE, and extensive cleaning of exam room while observing appropriate contact time as indicated for disinfecting solutions.  Subjective:     Patient ID: Edward [name] , male    DOB: [birth year] , 70 y.o.   MRN: [id number]   Chief Complaint  Patient presents with   Back Pain    HPI  Patient reports the back in his lower back comes and goes. Patient reports for several weeks his urine has been cloudy. He has been trying to drink approximately 48 oz water a day. Today he is not having any discomfort  Wt Readings from Last 3 Encounters: 07/02/21 : 200 lb 3.2 oz (90.8 kg) 06/17/21 : 197 lb (89.4 kg) 06/17/21 : 197 lb (89.4 kg)    Back Pain This is a new problem. The current episode started 1 to 4 weeks ago. The pain is present in the lumbar spine. The pain does not radiate. The pain is at a severity of 1/10. The pain is moderate. Pertinent negatives include no dysuria, headaches or weakness. (chills) Risk factors include sedentary lifestyle. Treatments tried: increased water intake.    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 dx by echo 07/2020.   Dilation of aorta (HCC)    Environmental allergies    Fracture, ribs    H/O: varicose veins    Hearing loss    Hyperlipidemia LDL goal <70    Multiple food allergies    Rash    Seasonal allergies    Sinusitis    Stroke [facility])    s/p tpa     Family History  Problem Relation Age of Onset   CVA Mother    Heart failure Father        CHF   Brain cancer Brother        GBM   Colon polyps Neg Hx    Colon cancer Neg Hx    Esophageal cancer Neg Hx    Rectal cancer Neg Hx    Stomach cancer Neg Hx      Current Outpatient  Medications:    aspirin EC 81 MG tablet, Take 81 mg by mouth daily. Swallow whole., Disp: , Rfl:    fluorouracil (EFUDEX) 5 % cream, Apply topically as needed., Disp: , Rfl:    metoprolol succinate (TOPROL XL) 25 MG 24 hr tablet, Take 1 tablet (25 mg total) by mouth daily., Disp: 90 tablet, Rfl: 3   mupirocin ointment (BACTROBAN) 2 %, Apply 1 application topically as needed., Disp: , Rfl:    rosuvastatin (CRESTOR) 10 MG tablet, Take 1 tablet (10 mg total) by mouth daily., Disp: 45 tablet, Rfl: 1   sacubitril-valsartan (ENTRESTO) 24-26 MG, Take 1 tablet by mouth 2 (two) times daily., Disp: 180 tablet, Rfl: 2   tacrolimus (PROTOPIC) 0.1 % ointment, Apply 1 application topically as needed., Disp: , Rfl:    sildenafil (VIAGRA) 100 MG tablet, Take 1 tablet by mouth as needed.  (Patient not taking: No sig reported), Disp: , Rfl:    spironolactone (ALDACTONE) 25 MG tablet, TAKE 1/2 TABLET BY MOUTH EVERY DAY, Disp: 15 tablet, Rfl: 0   No Known Allergies   Review of Systems  Genitourinary:  Negative  for dysuria.  Musculoskeletal:  Positive for back pain.  Neurological:  Negative for weakness and headaches.    Today's Vitals   07/02/21 1537  BP: 114/70  Pulse: 87  Temp: 98.2 F (36.8 C)  Weight: 200 lb 3.2 oz (90.8 kg)  Height: 5' 9.4" (1.763 m)  PainSc: 0-No pain   Body mass index is 29.22 kg/m.   Objective:  Physical Exam Vitals reviewed.  Constitutional:      Appearance: Normal appearance.  Cardiovascular:     Rate and Rhythm: Normal rate and regular rhythm.     Pulses: Normal pulses.     Heart sounds: Normal heart sounds.  Pulmonary:     Effort: Pulmonary effort is normal. No respiratory distress.     Breath sounds: Normal breath sounds. No wheezing.  Abdominal:     Tenderness: There is no right CVA tenderness or left CVA tenderness.  Musculoskeletal:        General: No swelling or tenderness. Normal range of motion.  Skin:    General: Skin is warm and dry.     Capillary  Refill: Capillary refill takes less than 2 seconds.  Neurological:     General: No focal deficit present.     Mental Status: He is alert and oriented to person, place, and time.     Cranial Nerves: No cranial nerve deficit.     Motor: No weakness.  Psychiatric:        Mood and Affect: Mood normal.        Behavior: Behavior normal.        Thought Content: Thought content normal.        Judgment: Judgment normal.        Assessment And Plan:     1. Bilateral low back pain, unspecified chronicity, unspecified whether sciatica present - POCT Urinalysis Dipstick (81002)  2. Urinary tract infection with hematuria, site unspecified Comments: positive nitrate and leuk, will treat with antibiotics and send urine for culture - cefTRIAXone (ROCEPHIN) injection 1 g - nitrofurantoin, macrocrystal-monohydrate, (MACROBID) 100 MG capsule; Take 1 capsule (100 mg total) by mouth 2 (two) times daily for 5 days.  Dispense: 10 capsule; Refill: 0 - Culture, Urine  3. Myalgia due to statin Comments: while taking atorvastatin he had myalgias and fatigue, changed to rosuvastatin to see if has less side effects, so far doing well    Patient was given opportunity to ask questions. Patient verbalized understanding of the plan and was able to repeat key elements of the plan. All questions were answered to their satisfaction.  [name], FNP   I, [name], FNP, have reviewed all documentation for this visit. The documentation on 07/28/21 for the exam, diagnosis, procedures, and orders are all accurate and complete.   IF YOU HAVE BEEN REFERRED TO A SPECIALIST, IT MAY TAKE 1-2 WEEKS TO SCHEDULE/PROCESS THE REFERRAL. IF YOU HAVE NOT HEARD FROM US/SPECIALIST IN TWO WEEKS, PLEASE GIVE [place] A CALL AT [phone] X [phone].   THE PATIENT IS ENCOURAGED TO PRACTICE SOCIAL DISTANCING DUE TO THE COVID-19 PANDEMIC.

## 2021-07-07 LAB — URINE CULTURE

## 2021-07-24 ENCOUNTER — Other Ambulatory Visit: Payer: Self-pay | Admitting: Cardiology

## 2021-07-28 DIAGNOSIS — T466X5A Adverse effect of antihyperlipidemic and antiarteriosclerotic drugs, initial encounter: Secondary | ICD-10-CM | POA: Insufficient documentation

## 2021-07-28 DIAGNOSIS — M791 Myalgia, unspecified site: Secondary | ICD-10-CM | POA: Insufficient documentation

## 2021-08-04 ENCOUNTER — Telehealth: Payer: Self-pay

## 2021-08-04 ENCOUNTER — Encounter: Payer: Self-pay | Admitting: Cardiology

## 2021-08-04 MED ORDER — ENTRESTO 24-26 MG PO TABS: 1.0000 | ORAL_TABLET | Freq: Two times a day (BID) | ORAL | 3 refills | Status: DC

## 2021-08-04 NOTE — Chronic Care Management (AMB) (Signed)
    Edward [name] was reminded to have all medications, supplements and any blood glucose and blood pressure readings available for review with [name], Pharm. D, at his telephone visit on 08-05-2021  at 3:00.   Questions: Have you had any recent office visit or specialist visit outside of [place]? Patient stated no.  Are there any concerns you would like to discuss during your office visit? Patient stated no.  Are you having any problems obtaining your medications? (Whether it pharmacy issues or cost) Patient stated no  If patient has any PAP medications ask if they are having any problems getting their PAP medication or refill? Patient stated he was approved for entresto through [employer] but [facility] Contacted [employer] and was told ordered wasn't placed for delivery. Order placed and will be delivered 08-07-2021. Patient stated he [facility] a few minutes ago with no copay. [facility]ried [facility] to [facility] but was unable to get a pharmacist on the phone.  Care Gaps: Shingrix overdue Covid booster overdue PNA Vac overdue AWV 06-23-2022  Star Rating Drug: Atorvastatin 10 mg- Last filled 06-23-2021 [facility] Entresto 24-26 mg- Patient assistance  Any gaps in medications fill history? No  [facility] Pharmacist Assistant [phone]

## 2021-08-05 ENCOUNTER — Ambulatory Visit: Payer: Medicare HMO

## 2021-08-05 DIAGNOSIS — I10 Essential (primary) hypertension: Secondary | ICD-10-CM

## 2021-08-05 DIAGNOSIS — E782 Mixed hyperlipidemia: Secondary | ICD-10-CM

## 2021-08-05 NOTE — Progress Notes (Signed)
Chronic Care Management Pharmacy Note  08/06/2021 Name:  Edward [name] MRN:  [id number] DOB:  07/21/1951  Summary: Patient reports that he paid $47 for his Entresto at the pharmacy he has been approved for patient assistance.   Recommendations/Changes made from today's visit: Recommend patient receive COVID-19 booster vaccine. Recommend patient continue to check his BP at least two times per week.   Plan: Patient reports that he is going to continue to exercise.    Subjective: Tell Rozelle is an 70 y.o. year old male who is a primary patient of [name], [place].  The CCM team was consulted for assistance with disease management and care coordination needs.    Engaged with patient by telephone for follow up visit in response to provider referral for pharmacy case management and/or care coordination services.   Consent to Services:  The patient was given information about Chronic Care Management services, agreed to services, and gave verbal consent prior to initiation of services.  Please see initial visit note for detailed documentation.   Patient Care Team: [name], FNP as PCP - General (General Practice) Sueanne Margarita, MD as PCP - Cardiology (Cardiology) Tat, Eustace Quail, DO as Consulting Physician (Neurology) Mayford Knife, [facility] (Pharmacist)  Recent office visits: 07/02/2021 PCP OV  06/17/2021 PCP OV  Recent consult visits: None in the past 6 months   Hospital visits: None in previous 6 months   Objective:  Lab Results  Component Value Date   CREATININE 1.23 06/17/2021   BUN 17 06/17/2021   GFRNONAA 65 09/04/2020   GFRAA 76 09/04/2020   NA 137 06/17/2021   K 4.8 06/17/2021   CALCIUM 9.4 06/17/2021   CO2 20 06/17/2021   GLUCOSE 81 06/17/2021    Lab Results  Component Value Date/Time   MICROALBUR 10 07/14/2020 03:58 PM    Last diabetic Eye exam: No results found for: HMDIABEYEEXA  Last diabetic Foot exam: No results found for: HMDIABFOOTEX   Lab  Results  Component Value Date   CHOL 217 (H) 06/17/2021   HDL 44 06/17/2021   LDLCALC 145 (H) 06/17/2021   TRIG 154 (H) 06/17/2021   CHOLHDL 4.9 06/17/2021    Hepatic Function Latest Ref Rng & Units 06/17/2021 08/13/2020  Total Protein 6.0 - 8.5 g/dL 6.6 6.4  Albumin 3.8 - 4.8 g/dL 4.5 -  AST 0 - 40 IU/L 25 -  ALT 0 - 44 IU/L 25 31  Alk Phosphatase 44 - 121 IU/L 50 -  Total Bilirubin 0.0 - 1.2 mg/dL 0.4 -    Lab Results  Component Value Date/Time   TSH 1.420 08/13/2020 11:55 AM   TSH 1.650 05/24/2019 12:42 PM    CBC Latest Ref Rng & Units 05/24/2019  WBC 3.4 - 10.8 x10E3/uL 6.3  Hemoglobin 13.0 - 17.7 g/dL 13.9  Hematocrit 37.5 - 51.0 % 42.0  Platelets 150 - 450 x10E3/uL 307    No results found for: VD25OH  Clinical ASCVD: Yes  The 10-year ASCVD risk score (Arnett DK, et al., 2019) is: 16.8%   Values used to calculate the score:     Age: 70 years     Sex: Male     Is Non-Hispanic African American: No     Diabetic: No     Tobacco smoker: No     Systolic Blood Pressure: 466 mmHg     Is BP treated: Yes     HDL Cholesterol: 44 mg/dL     Total Cholesterol: 217 mg/dL  Depression screen [facility] 2/9 06/17/2021 06/17/2021 06/11/2020  Decreased Interest 0 0 0  Down, Depressed, Hopeless 0 0 0  PHQ - 2 Score 0 0 0  Altered sleeping - - -  Tired, decreased energy - - -  Change in appetite - - -  Feeling bad or failure about yourself  - - -  Trouble concentrating - - -  Moving slowly or fidgety/restless - - -  Suicidal thoughts - - -  PHQ-9 Score - - -      Social History   Tobacco Use  Smoking Status Former  Smokeless Tobacco Never  Tobacco Comments   quit 40+ years ago   BP Readings from Last 3 Encounters:  07/02/21 114/70  06/17/21 102/68  06/17/21 102/68   Pulse Readings from Last 3 Encounters:  07/02/21 87  06/17/21 69  06/17/21 69   Wt Readings from Last 3 Encounters:  07/02/21 200 lb 3.2 oz (90.8 kg)  06/17/21 197 lb (89.4 kg)  06/17/21 197 lb  (89.4 kg)   BMI Readings from Last 3 Encounters:  07/02/21 29.22 kg/m  06/17/21 26.72 kg/m  06/17/21 26.72 kg/m    Assessment/Interventions: Review of patient past medical history, allergies, medications, health status, including review of consultants reports, laboratory and other test data, was performed as part of comprehensive evaluation and provision of chronic care management services.   SDOH:  (Social Determinants of Health) assessments and interventions performed: Yes SDOH Interventions    Flowsheet Row Most Recent Value  SDOH Interventions   Financial Strain Interventions --  [Patient assistance for Entresto completed]      SDOH Screenings   Alcohol Screen: Not on file  Depression (PHQ2-9): Low Risk    PHQ-2 Score: 0  Financial Resource Strain: High Risk   Difficulty of Paying Living Expenses: Hard  Food Insecurity: No Food Insecurity   Worried About Charity fundraiser in the Last Year: Never true   Ran Out of Food in the Last Year: Never true  Housing: Not on file  Physical Activity: Inactive   Days of Exercise per Week: 0 days   Minutes of Exercise per Session: 0 min  Social Connections: Not on file  Stress: No Stress Concern Present   Feeling of Stress : Not at all  Tobacco Use: Medium Risk   Smoking Tobacco Use: Former   Smokeless Tobacco Use: Never   Passive Exposure: Not on file  Transportation Needs: No Transportation Needs   Lack of Transportation (Medical): No   Lack of Transportation (Non-Medical): No    CCM Care Plan  No Known Allergies  Medications Reviewed Today     Reviewed by [name], FNP (Family Nurse Practitioner) on 07/02/21 at [place] List Status: <None>   Medication Order Taking? Sig Documenting Provider Last Dose Status Informant  aspirin EC 81 MG tablet [id number] Yes Take 81 mg by mouth daily. Swallow whole. [provider] Taking Active   fluorouracil (EFUDEX) 5 % cream [id number] Yes Apply topically as needed.  [provider] Taking Active   metoprolol succinate (TOPROL XL) 25 MG 24 hr tablet [id number] Yes Take 1 tablet (25 mg total) by mouth daily. Charlie Pitter, PA-C Taking Active   mupirocin ointment (BACTROBAN) 2 % [id number] Yes Apply 1 application topically as needed. [provider] Taking Active   rosuvastatin (CRESTOR) 10 MG tablet [id number] Yes Take 1 tablet (10 mg total) by mouth daily. [name], FNP Taking Active   sacubitril-valsartan [facility]) 24-26 [place] [id number]  Yes Take 1 tablet by mouth 2 (two) times daily. Sueanne Margarita, MD Taking Active   sildenafil (VIAGRA) 100 MG tablet [id number] No Take 1 tablet by mouth as needed.   Patient not taking: No sig reported   [provider] Not Taking Active   spironolactone (ALDACTONE) 25 MG tablet [id number] Yes Take 0.5 tablets (12.5 mg total) by mouth daily. Sueanne Margarita, MD Taking Active   tacrolimus (PROTOPIC) 0.1 % ointment [id number] Yes Apply 1 application topically as needed. [provider] Taking Active             Patient Active Problem List   Diagnosis Date Noted   Myalgia due to statin 07/28/2021   Mixed hyperlipidemia 06/11/2020   Solitary pulmonary nodule 08/08/2019   Diverticulitis of colon 74/94/4967   Umbilical hernia 59/16/3846   Elevated LDL cholesterol level 08/08/2019   Ascending aorta dilation ([place]) 05/24/2019   Benign head tremor 05/24/2019   Abnormal EKG 05/29/2018   Benign essential HTN 05/29/2018    Immunization History  Administered Date(s) Administered   Fluad Quad(high Dose 65+) 06/11/2020   Janssen (J&J) SARS-COV-2 Vaccination 12/29/2019   Pneumococcal Conjugate-13 05/24/2019   Pneumococcal-Unspecified 06/29/2020    Conditions to be addressed/monitored:  Hypertension and Hyperlipidemia  Care Plan : [place]  Updates made by Mayford Knife, RPH since 08/06/2021 12:00 AM     Problem: HTN, HLD   Priority: High  Onset Date: 10/15/2020      Long-Range Goal: Disease Management   Start Date: 10/15/2020  Recent Progress: On track  Priority: High  Note:   Current Barriers:  Does not maintain contact with provider office Does not contact provider office for questions/concerns  Pharmacist Clinical Goal(s):  Patient will achieve adherence to monitoring guidelines and medication adherence to achieve therapeutic efficacy through collaboration with PharmD and provider.   Interventions: 1:1 collaboration with [name], FNP regarding development and update of comprehensive plan of care as evidenced by provider attestation and co-signature Inter-disciplinary care team collaboration (see longitudinal plan of care) Comprehensive medication review performed; medication list updated in electronic medical record  Hypertension (BP goal <130/80) -Controlled -Current treatment: Entresto 24-26 mg taking 1 tablet by mouth twice per day  Metoprolol Succinate 25 mg tablet once per day Spironolactone 25 mg tablet taking 1/2 tablet once per day -Current home readings: variation in readings: usually about 115/80  -Current dietary habits: he is limiting fried and fatty foods  -Current exercise habits: patient reports that he is walking often at work.  -Denies hypotensive/hypertensive symptoms -Educated on Daily salt intake goal < 2300 mg; Exercise goal of 150 minutes per week; -Counseled to monitor BP at home three times per week, document, and provide log at future appointments -Recommended to continue current medication  Hyperlipidemia: (LDL goal < 70) -Controlled -Current treatment: Rosuvastatin 10 mg tablet once per day  -Medications previously tried: none note   -Current dietary patterns: he is cutting down his fried and fatty foods.  -Current exercise habits: patient reports that he is walking often at work.  -Patient reports that he is taking his medication every day and he is not having any issues.  -Congratulated him on  taking his medication every day.  -Educated on Cholesterol goals;  Importance of limiting foods high in cholesterol; -Recommended to continue current medication  Patient Goals/Self-Care Activities Patient will:  - take medications as prescribed as evidenced by patient report and record review  Follow Up Plan: The patient has been  provided with contact information for the care management team and has been advised to call with any health related questions or concerns.       Medication Assistance:  [name] obtained through [employer]  medication assistance program.  Enrollment ends 07/2021  Compliance/Adherence/Medication fill history: Care Gaps: Shingrix Vaccine  COVID-19 Booster Pneumonia Vaccine   Star-Rating Drugs: Rosuvastatin 10 mg tablet   Patient's [facility] pharmacy [facility]:  [id number] [place] [place] - [address]Phone: [phone]: [phone] Uses pill box? No - patient keeps his medication in a pill bottle  Pt endorses 95% compliance  We discussed: Benefits of medication synchronization, packaging and delivery as well as enhanced pharmacist oversight with [employer]. Patient decided to: Continue current medication management strategy  Care Plan and Follow Up Patient Decision:  Patient agrees to Care Plan and Follow-up.  Plan: The patient has been provided with contact information for the care management team and has been advised to call with any health related questions or concerns.  Follow up with provider re: 08/26/2021    VOrlando Penner CPP, PharmD Clinical Pharmacist Practitioner [facility] [phone]

## 2021-08-06 NOTE — Patient Instructions (Signed)
Visit Information It was great speaking with you today!  Please let me know if you have any questions about our visit.   Goals Addressed             This Visit's Progress    Manage My Medicine       Timeframe:  Long-Range Goal Priority:  High Start Date:      06/23/2021                       Expected End Date:  06/23/2022                     Follow Up: 12/18/2021   - call for medicine refill 2 or 3 days before it runs out - keep a list of all the medicines I take; vitamins and herbals too - use a pillbox to sort medicine    Why is this important?   These steps will help you keep on track with your medicines.           Patient Care Plan: CCM Pharmacy Care Plan     Problem Identified: HTN, HLD   Priority: High  Onset Date: 10/15/2020     Long-Range Goal: Disease Management   Start Date: 10/15/2020  Recent Progress: On track  Priority: High  Note:   Current Barriers:  Does not maintain contact with provider office Does not contact provider office for questions/concerns  Pharmacist Clinical Goal(s):  Patient will achieve adherence to monitoring guidelines and medication adherence to achieve therapeutic efficacy through collaboration with PharmD and provider.   Interventions: 1:1 collaboration with [name], FNP regarding development and update of comprehensive plan of care as evidenced by provider attestation and co-signature Inter-disciplinary care team collaboration (see longitudinal plan of care) Comprehensive medication review performed; medication list updated in electronic medical record  Hypertension (BP goal <130/80) -Controlled -Current treatment: Entresto 24-26 mg taking 1 tablet by mouth twice per day  Metoprolol Succinate 25 mg tablet once per day Spironolactone 25 mg tablet taking 1/2 tablet once per day -Current home readings: variation in readings: usually about 115/80  -Current dietary habits: he is limiting fried and fatty foods   -Current exercise habits: patient reports that he is walking often at work.  -Denies hypotensive/hypertensive symptoms -Educated on Daily salt intake goal < 2300 mg; Exercise goal of 150 minutes per week; -Counseled to monitor BP at home three times per week, document, and provide log at future appointments -Recommended to continue current medication  Hyperlipidemia: (LDL goal < 70) -Controlled -Current treatment: Rosuvastatin 10 mg tablet once per day  -Medications previously tried: none note   -Current dietary patterns: he is cutting down his fried and fatty foods.  -Current exercise habits: patient reports that he is walking often at work.  -Patient reports that he is taking his medication every day and he is not having any issues.  -Congratulated him on taking his medication every day.  -Educated on Cholesterol goals;  Importance of limiting foods high in cholesterol; -Recommended to continue current medication  Patient Goals/Self-Care Activities Patient will:  - take medications as prescribed as evidenced by patient report and record review  Follow Up Plan: The patient has been provided with contact information for the care management team and has been advised to call with any health related questions or concerns.       Patient agreed to services and verbal consent obtained.   The patient verbalized understanding of instructions, educational  materials, and care plan provided today and agreed to receive a mailed copy of patient instructions, educational materials, and care plan.   [name], PharmD Clinical Pharmacist [facility] [phone]

## 2021-08-26 ENCOUNTER — Other Ambulatory Visit: Payer: Self-pay | Admitting: Cardiology

## 2021-08-26 ENCOUNTER — Ambulatory Visit (INDEPENDENT_AMBULATORY_CARE_PROVIDER_SITE_OTHER): Payer: Medicare HMO | Admitting: Nurse Practitioner

## 2021-08-26 ENCOUNTER — Other Ambulatory Visit: Payer: Self-pay

## 2021-08-26 ENCOUNTER — Encounter: Payer: Self-pay | Admitting: Nurse Practitioner

## 2021-08-26 VITALS — BP 114/78 | HR 65 | Temp 98.9°F | Ht 69.4 in | Wt 206.8 lb

## 2021-08-26 DIAGNOSIS — R0982 Postnasal drip: Secondary | ICD-10-CM

## 2021-08-26 DIAGNOSIS — Z6830 Body mass index (BMI) 30.0-30.9, adult: Secondary | ICD-10-CM | POA: Diagnosis not present

## 2021-08-26 DIAGNOSIS — I10 Essential (primary) hypertension: Secondary | ICD-10-CM

## 2021-08-26 DIAGNOSIS — Z79899 Other long term (current) drug therapy: Secondary | ICD-10-CM

## 2021-08-26 DIAGNOSIS — I119 Hypertensive heart disease without heart failure: Secondary | ICD-10-CM | POA: Diagnosis not present

## 2021-08-26 DIAGNOSIS — Z23 Encounter for immunization: Secondary | ICD-10-CM | POA: Diagnosis not present

## 2021-08-26 DIAGNOSIS — E782 Mixed hyperlipidemia: Secondary | ICD-10-CM

## 2021-08-26 DIAGNOSIS — I429 Cardiomyopathy, unspecified: Secondary | ICD-10-CM | POA: Diagnosis not present

## 2021-08-26 DIAGNOSIS — E6609 Other obesity due to excess calories: Secondary | ICD-10-CM | POA: Diagnosis not present

## 2021-08-26 LAB — POC INFLUENZA A&B (BINAX/QUICKVUE)
Influenza A, POC: NEGATIVE
Influenza B, POC: NEGATIVE

## 2021-08-26 LAB — POC COVID19 BINAXNOW: SARS Coronavirus 2 Ag: NEGATIVE

## 2021-08-26 MED ORDER — CETIRIZINE HCL 10 MG PO TABS: 10.0000 mg | ORAL_TABLET | Freq: Every day | ORAL | 2 refills | Status: DC

## 2021-08-26 MED ORDER — ROSUVASTATIN CALCIUM 10 MG PO TABS: ORAL_TABLET | ORAL | 1 refills | Status: DC

## 2021-08-26 MED ORDER — ZOSTER VAC RECOMB ADJUVANTED 50 MCG/0.5ML IM SUSR: 0.5000 mL | Freq: Once | INTRAMUSCULAR | 0 refills | Status: AC

## 2021-08-26 MED ORDER — FLUTICASONE PROPIONATE 50 MCG/ACT NA SUSP: 2.0000 | Freq: Every day | NASAL | 2 refills | Status: DC

## 2021-08-26 NOTE — Progress Notes (Signed)
I,Yamilka J Llittleton,acting as a Education administrator for [employer], FNP.,have documented all relevant documentation on the behalf of [name], FNP,as directed by  [name], FNP while in the presence of [name], [place].   This visit occurred during the SARS-CoV-2 public health emergency.  Safety protocols were in place, including screening questions prior to the visit, additional usage of staff PPE, and extensive cleaning of exam room while observing appropriate contact time as indicated for disinfecting solutions.  Subjective:     Patient ID: Edward [name] , male    DOB: August 18, 1951 , 70 y.o.   MRN: [id number]   Chief Complaint  Patient presents with   Hyperlipidemia    HPI  Patient presents today for a chol and bp check. Patient was started on crestor at his last visit. He reports he does not like the way the medication makes him feel, has been taking 1/2 tablet daily  Wt Readings from Last 3 Encounters: 08/26/21 : 206 lb 12.8 oz (93.8 kg) 07/02/21 : 200 lb 3.2 oz (90.8 kg) 06/17/21 : 197 lb (89.4 kg)    Hyperlipidemia This is a chronic problem. The current episode started more than 1 year ago. The problem is controlled. Recent lipid tests were reviewed and are normal. Pertinent negatives include no chest pain.    Past Medical History:  Diagnosis Date   Abnormal EKG 05/29/2018   Sinus bradycardia and LAFB with TWI V4-V6, III and aVF   Benign essential HTN 05/29/2018   Cardiomyopathy ([place])    a. dx by echo 07/2020.   Dilation of aorta (HCC)    Environmental allergies    Fracture, ribs    H/O: varicose veins    Hearing loss    Hyperlipidemia LDL goal <70    Multiple food allergies    Rash    Seasonal allergies    Sinusitis    Stroke [facility])    s/p tpa     Family History  Problem Relation Age of Onset   CVA Mother    Heart failure Father        CHF   Brain cancer Brother        GBM   Colon polyps Neg Hx    Colon cancer Neg Hx    Esophageal cancer Neg Hx    Rectal  cancer Neg Hx    Stomach cancer Neg Hx      Current Outpatient Medications:    aspirin EC 81 MG tablet, Take 81 mg by mouth daily. Swallow whole., Disp: , Rfl:    fluticasone (FLONASE) 50 MCG/ACT nasal spray, Place 2 sprays into both nostrils daily., Disp: 16 g, Rfl: 2   sacubitril-valsartan (ENTRESTO) 24-26 MG, Take 1 tablet by mouth 2 (two) times daily., Disp: 180 tablet, Rfl: 3   sildenafil (VIAGRA) 100 MG tablet, Take 1 tablet by mouth as needed., Disp: , Rfl:    spironolactone (ALDACTONE) 25 MG tablet, Take 0.5 tablets (12.5 mg total) by mouth daily. Please make a yearly appt with Dr. Radford Pax for January 2023 for future refills. Thank you 1st attempt, Disp: 15 tablet, Rfl: 0   fluorouracil (EFUDEX) 5 % cream, Apply topically as needed. (Patient not taking: Reported on 08/26/2021), Disp: , Rfl:    metoprolol succinate (TOPROL XL) 25 MG 24 hr tablet, Take 1 tablet (25 mg total) by mouth daily., Disp: 90 tablet, Rfl: 0   mupirocin ointment (BACTROBAN) 2 %, Apply 1 application topically as needed. (Patient not taking: Reported on 08/26/2021), Disp: , Rfl:  rosuvastatin (CRESTOR) 10 MG tablet, Take 1 tab by mouth MWF, Disp: 45 tablet, Rfl: 1   tacrolimus (PROTOPIC) 0.1 % ointment, Apply 1 application topically as needed. (Patient not taking: Reported on 08/26/2021), Disp: , Rfl:    No Known Allergies   Review of Systems  Constitutional: Negative.   HENT:  Positive for congestion (since mid november, this week has more sneezing and runny nose), postnasal drip and rhinorrhea.   Eyes: Negative.   Respiratory: Negative.    Cardiovascular: Negative.  Negative for chest pain, palpitations and leg swelling.  Gastrointestinal:        Feels like his food is getting stuck in the chest area. Will also have belching more. Worse in the past month. Trying to cut back on eating late at night.   Musculoskeletal: Negative.   Skin: Negative.   Neurological:  Negative for dizziness and headaches.   Psychiatric/Behavioral: Negative.      Today's Vitals   08/26/21 1555  BP: 114/78  Pulse: 65  Temp: 98.9 F (37.2 C)  Weight: 206 lb 12.8 oz (93.8 kg)  Height: 5' 9.4" (1.763 m)  PainSc: 0-No pain   Body mass index is 30.19 kg/m.   Objective:  Physical Exam Vitals reviewed.  Constitutional:      General: He is not in acute distress.    Appearance: Normal appearance.  Cardiovascular:     Rate and Rhythm: Normal rate and regular rhythm.     Pulses: Normal pulses.     Heart sounds: Normal heart sounds. No murmur heard. Pulmonary:     Effort: Pulmonary effort is normal. No respiratory distress.     Breath sounds: Normal breath sounds. No wheezing.  Neurological:     General: No focal deficit present.     Mental Status: He is alert and oriented to person, place, and time.     Cranial Nerves: No cranial nerve deficit.     Motor: No weakness.        Assessment And Plan:     1. Mixed hyperlipidemia Comments: Cholesterol levels have been improving, continue statin - rosuvastatin (CRESTOR) 10 MG tablet; Take 1 tab by mouth MWF  Dispense: 45 tablet; Refill: 1 - Lipid panel; Future  2. Benign essential HTN Comments: Blood pressure is well controlled, continue current medications - CMP14+EGFR; Future  3. Cardiomyopathy, unspecified type [facility]) Comments: Being followed by Cardiology. Takes Entresto  4. Post-nasal drip Comments: He is to take cetirizine as needed, this has been ongoing for the last several months. Negative rapid influenza A/B, and covid. - POC COVID-19 - POC Influenza A&B(BINAX/QUICKVUE) - Novel Coronavirus, NAA ([facility])  5. Class 1 obesity due to excess calories with body mass index (BMI) of 30.0 to 30.9 in adult, unspecified whether serious comorbidity present Chronic Discussed healthy diet and regular exercise options  Encouraged to exercise at least 150 minutes per week with 2 days of strength training  6. Immunization due - Zoster Vaccine  Adjuvanted [facility]) injection; Inject 0.5 mLs into the muscle once for 1 dose.  Dispense: 0.5 mL; Refill: 0  7. Encounter for long-term (current) use of medications - CBC no Diff; Future    Patient was given opportunity to ask questions. Patient verbalized understanding of the plan and was able to repeat key elements of the plan. All questions were answered to their satisfaction.  [name], FNP   I, [name], FNP, have reviewed all documentation for this visit. The documentation on 08/26/21 for the exam, diagnosis, procedures, and  orders are all accurate and complete.   IF YOU HAVE BEEN REFERRED TO A SPECIALIST, IT MAY TAKE 1-2 WEEKS TO SCHEDULE/PROCESS THE REFERRAL. IF YOU HAVE NOT HEARD FROM US/SPECIALIST IN TWO WEEKS, PLEASE GIVE [place] A CALL AT [phone] X [phone].   THE PATIENT IS ENCOURAGED TO PRACTICE SOCIAL DISTANCING DUE TO THE COVID-19 PANDEMIC.

## 2021-08-26 NOTE — Patient Instructions (Signed)

## 2021-08-27 LAB — SARS-COV-2, NAA 2 DAY TAT

## 2021-08-27 LAB — NOVEL CORONAVIRUS, NAA: SARS-CoV-2, NAA: NOT DETECTED

## 2021-09-02 ENCOUNTER — Other Ambulatory Visit: Payer: Medicare HMO

## 2021-09-02 ENCOUNTER — Other Ambulatory Visit: Payer: Self-pay

## 2021-09-02 DIAGNOSIS — Z79899 Other long term (current) drug therapy: Secondary | ICD-10-CM | POA: Diagnosis not present

## 2021-09-02 DIAGNOSIS — E782 Mixed hyperlipidemia: Secondary | ICD-10-CM

## 2021-09-02 DIAGNOSIS — I10 Essential (primary) hypertension: Secondary | ICD-10-CM

## 2021-09-03 LAB — CMP14+EGFR
ALT: 25 IU/L (ref 0–44)
AST: 24 IU/L (ref 0–40)
Albumin/Globulin Ratio: 2.3 — ABNORMAL HIGH (ref 1.2–2.2)
Albumin: 4.3 g/dL (ref 3.8–4.8)
Alkaline Phosphatase: 46 IU/L (ref 44–121)
BUN/Creatinine Ratio: 13 (ref 10–24)
BUN: 16 mg/dL (ref 8–27)
Bilirubin Total: 0.9 mg/dL (ref 0.0–1.2)
CO2: 24 mmol/L (ref 20–29)
Calcium: 9.1 mg/dL (ref 8.6–10.2)
Chloride: 100 mmol/L (ref 96–106)
Creatinine, Ser: 1.28 mg/dL — ABNORMAL HIGH (ref 0.76–1.27)
Globulin, Total: 1.9 g/dL (ref 1.5–4.5)
Glucose: 82 mg/dL (ref 70–99)
Potassium: 5 mmol/L (ref 3.5–5.2)
Sodium: 136 mmol/L (ref 134–144)
Total Protein: 6.2 g/dL (ref 6.0–8.5)
eGFR: 61 mL/min/{1.73_m2} (ref >59)

## 2021-09-03 LAB — CBC
Hematocrit: 40.7 % (ref 37.5–51.0)
Hemoglobin: 13.8 g/dL (ref 13.0–17.7)
MCH: 31.6 pg (ref 26.6–33.0)
MCHC: 33.9 g/dL (ref 31.5–35.7)
MCV: 93 fL (ref 79–97)
Platelets: 281 10*3/uL (ref 150–450)
RBC: 4.37 x10E6/uL (ref 4.14–5.80)
RDW: 12.2 % (ref 11.6–15.4)
WBC: 6 10*3/uL (ref 3.4–10.8)

## 2021-09-03 LAB — LIPID PANEL
Chol/HDL Ratio: 3.4 ratio (ref 0.0–5.0)
Cholesterol, Total: 149 mg/dL (ref 100–199)
HDL: 44 mg/dL (ref >39)
LDL Chol Calc (NIH): 90 mg/dL (ref 0–99)
Triglycerides: 78 mg/dL (ref 0–149)
VLDL Cholesterol Cal: 15 mg/dL (ref 5–40)

## 2021-09-11 ENCOUNTER — Telehealth: Payer: Self-pay

## 2021-09-11 NOTE — Chronic Care Management (AMB) (Signed)
° ° °  Chronic Care Management Pharmacy Assistant   Name: Edward [name]  MRN: [id number] DOB: 1951/03/10  Reason for Encounter: Patient Assistance Coordination  09/11/21- Patient assistance application mailed to patient for Entresto from [employer] patient assistance program. Spouse notified by [name], Clinical Pharmacist to return to PCP for signature.   Medications: Outpatient Encounter Medications as of 09/11/2021  Medication Sig   aspirin EC 81 MG tablet Take 81 mg by mouth daily. Swallow whole.   fluorouracil (EFUDEX) 5 % cream Apply topically as needed. (Patient not taking: Reported on 08/26/2021)   fluticasone (FLONASE) 50 MCG/ACT nasal spray Place 2 sprays into both nostrils daily.   metoprolol succinate (TOPROL XL) 25 MG 24 hr tablet Take 1 tablet (25 mg total) by mouth daily.   mupirocin ointment (BACTROBAN) 2 % Apply 1 application topically as needed. (Patient not taking: Reported on 08/26/2021)   rosuvastatin (CRESTOR) 10 MG tablet Take 1 tab by mouth MWF   sacubitril-valsartan (ENTRESTO) 24-26 MG Take 1 tablet by mouth 2 (two) times daily.   sildenafil (VIAGRA) 100 MG tablet Take 1 tablet by mouth as needed.   spironolactone (ALDACTONE) 25 MG tablet Take 0.5 tablets (12.5 mg total) by mouth daily. Please make a yearly appt with Dr. Radford Pax for January 2023 for future refills. Thank you 1st attempt   tacrolimus (PROTOPIC) 0.1 % ointment Apply 1 application topically as needed. (Patient not taking: Reported on 08/26/2021)   No facility-administered encounter medications on file as of 09/11/2021.   [name], [place] Pharmacist Assistant [phone]

## 2021-09-19 ENCOUNTER — Encounter: Payer: Self-pay | Admitting: Cardiology

## 2021-09-21 ENCOUNTER — Other Ambulatory Visit: Payer: Self-pay

## 2021-09-21 MED ORDER — METOPROLOL SUCCINATE ER 25 MG PO TB24: 25.0000 mg | ORAL_TABLET | Freq: Every day | ORAL | 0 refills | Status: DC

## 2021-10-05 ENCOUNTER — Telehealth: Payer: Self-pay | Admitting: Cardiology

## 2021-10-05 MED ORDER — SPIRONOLACTONE 25 MG PO TABS: 12.5000 mg | ORAL_TABLET | Freq: Every day | ORAL | 0 refills | Status: DC

## 2021-10-05 NOTE — Telephone Encounter (Signed)
Pt sent this refill via My chart to the [facility] pool:    *STAT* If patient is at the pharmacy, call can be transferred to refill team.   1. Which medications need to be refilled? (please list name of each medication and dose if known) I now need a refill for Spironolactone. I will be out of it after Monday February 6. Thank you.  2. Which pharmacy/location (including street and city if local pharmacy) is medication to be sent to?  [id number] - [place], [place] - [address]. AT [address] Phone:  [phone]      3. Do they need a 30 day or 90 day supply? [place]

## 2021-10-05 NOTE — Telephone Encounter (Signed)
Pt's medication was sent to pt's pharmacy as requested. Confirmation received.  °

## 2021-10-06 ENCOUNTER — Telehealth: Payer: Self-pay

## 2021-10-06 NOTE — Chronic Care Management (AMB) (Signed)
Chronic Care Management Pharmacy Assistant   Name: Edward [name]  MRN: [id number] DOB: 1950-11-11  Reason for Encounter: Disease State/ General  Recent office visits:  08-26-2021 [name], [place]. Creatinine= 1.28, Albumin/globulin= 2.3. START Flonase nasal spray 2 sprays into both nostrils daily. CHANGE rosuvastatin 10 mg daily to 10 mg MWF. Shingrix ordered.  Recent consult visits:  None  Hospital visits:  None in previous 6 months  Medications: Outpatient Encounter Medications as of 10/06/2021  Medication Sig   aspirin EC 81 MG tablet Take 81 mg by mouth daily. Swallow whole.   fluorouracil (EFUDEX) 5 % cream Apply topically as needed. (Patient not taking: Reported on 08/26/2021)   fluticasone (FLONASE) 50 MCG/ACT nasal spray Place 2 sprays into both nostrils daily.   metoprolol succinate (TOPROL XL) 25 MG 24 hr tablet Take 1 tablet (25 mg total) by mouth daily.   mupirocin ointment (BACTROBAN) 2 % Apply 1 application topically as needed. (Patient not taking: Reported on 08/26/2021)   rosuvastatin (CRESTOR) 10 MG tablet Take 1 tab by mouth MWF   sacubitril-valsartan (ENTRESTO) 24-26 MG Take 1 tablet by mouth 2 (two) times daily.   sildenafil (VIAGRA) 100 MG tablet Take 1 tablet by mouth as needed.   spironolactone (ALDACTONE) 25 MG tablet Take 0.5 tablets (12.5 mg total) by mouth daily. Please keep upcoming appt in March 2023 with Cardiologist before anymore refills. Thank you   tacrolimus (PROTOPIC) 0.1 % ointment Apply 1 application topically as needed. (Patient not taking: Reported on 08/26/2021)   No facility-administered encounter medications on file as of 10/06/2021.  Reviewed chart prior to disease state call. Spoke with patient regarding BP  Recent Office Vitals: BP Readings from Last 3 Encounters:  08/26/21 114/78  07/02/21 114/70  06/17/21 102/68   Pulse Readings from Last 3 Encounters:  08/26/21 65  07/02/21 87  06/17/21 69    Wt Readings from Last 3  Encounters:  08/26/21 206 lb 12.8 oz (93.8 kg)  07/02/21 200 lb 3.2 oz (90.8 kg)  06/17/21 197 lb (89.4 kg)     Kidney Function Lab Results  Component Value Date/Time   CREATININE 1.28 (H) 09/02/2021 11:05 AM   CREATININE 1.23 06/17/2021 03:43 PM   GFRNONAA 65 09/04/2020 04:48 PM   GFRAA 76 09/04/2020 04:48 PM    BMP Latest Ref Rng & Units 09/02/2021 06/17/2021 09/04/2020  Glucose 70 - 99 mg/dL 82 81 91  BUN 8 - 27 mg/dL 16 17 16   Creatinine 0.76 - 1.27 mg/dL 1.28(H) 1.23 1.15  BUN/Creat Ratio 10 - 24 13 14 14   Sodium 134 - 144 mmol/L 136 137 141  Potassium 3.5 - 5.2 mmol/L 5.0 4.8 4.4  Chloride 96 - 106 mmol/L 100 99 104  CO2 20 - 29 mmol/L 24 20 24   Calcium 8.6 - 10.2 mg/dL 9.1 9.4 9.2    Current antihypertensive regimen:  Entresto 24-26 mg twice daily Metoprolol 25 mg daily Spironolactone 25 mg half tablet daily  How often are you checking your Blood Pressure? 1-2x per week  Current home BP readings: 114/78  What recent interventions/DTPs have been made by any provider to improve Blood Pressure control since last CPP Visit:  Educated on Daily salt intake goal < 2300 mg; Exercise goal of 150 minutes per week; -Counseled to monitor BP at home three times per week, document, and provide log at future appointments -Recommended to continue current medication  Any recent hospitalizations or ED visits since last visit with CPP? No  What  diet changes have been made to improve Blood Pressure Control?  Patient stated he has limited his salt intake.  What exercise is being done to improve your Blood Pressure Control?  Patient states he walks daily at work.  Adherence Review: Is the patient currently on ACE/ARB medication? Yes Does the patient have >5 day gap between last estimated fill dates? No  10/06/2021 Name: Edward [name] MRN: [id number] DOB: [birth year] Edward [name] is a 71 y.o. year old male who is a primary care patient of [name], [place].  Comprehensive medication  review performed; Spoke to patient regarding cholesterol  Lipid Panel    Component Value Date/Time   CHOL 149 09/02/2021 1105   TRIG 78 09/02/2021 1105   HDL 44 09/02/2021 1105   LDLCALC 90 09/02/2021 1105    10-year ASCVD risk score: The 10-year ASCVD risk score (Arnett DK, et al., 2019) is: 13.6%   Values used to calculate the score:     Age: 71 years     Sex: Male     Is Non-Hispanic African American: No     Diabetic: No     Tobacco smoker: No     Systolic Blood Pressure: 037 mmHg     Is BP treated: Yes     HDL Cholesterol: 44 mg/dL     Total Cholesterol: 149 mg/dL  Current antihyperlipidemic regimen:  Rosuvastatin 10 mg MWF  Previous antihyperlipidemic medications tried: None  ASCVD risk enhancing conditions: age >60 and HTN  What recent interventions/DTPs have been made by any provider to improve Cholesterol control since last CPP Visit:  Educated on Cholesterol goals;  Importance of limiting foods high in cholesterol; -Recommended to continue current medication  Any recent hospitalizations or ED visits since last visit with CPP? No  What diet changes have been made to improve Cholesterol?  Patient stated he has limited his fried food intake.  What exercise is being done to improve Cholesterol?  Patient states he walks daily at work.  Adherence Review: Does the patient have >5 day gap between last estimated fill dates? Yes   Care Gaps: Shingrix overdue PNA Vac overdue AWV 06-23-2022  Star Rating Drugs: Rosuvastatin 10 mg- Last filled 10-03-2021 [facility] Entresto 24-26 mg- Patient assistance with Cardiology  [place] Pharmacist Assistant [phone]

## 2021-11-02 ENCOUNTER — Ambulatory Visit: Payer: Medicare HMO | Admitting: Physician Assistant

## 2021-11-02 DIAGNOSIS — D1801 Hemangioma of skin and subcutaneous tissue: Secondary | ICD-10-CM | POA: Diagnosis not present

## 2021-11-02 DIAGNOSIS — L821 Other seborrheic keratosis: Secondary | ICD-10-CM | POA: Diagnosis not present

## 2021-11-02 DIAGNOSIS — L814 Other melanin hyperpigmentation: Secondary | ICD-10-CM | POA: Diagnosis not present

## 2021-11-02 DIAGNOSIS — L570 Actinic keratosis: Secondary | ICD-10-CM | POA: Diagnosis not present

## 2021-11-02 DIAGNOSIS — L2089 Other atopic dermatitis: Secondary | ICD-10-CM | POA: Diagnosis not present

## 2021-11-04 ENCOUNTER — Telehealth: Payer: Self-pay

## 2021-11-04 NOTE — Progress Notes (Addendum)
Office Visit    Patient Name: Edward [name] Date of Encounter: 11/05/2021  PCP:  Edward [name], [facility]  Cardiologist:  Edward Him, MD  Advanced Practice Provider:  No care team member to display Electrophysiologist:  None   Chief Complaint    Edward [name] is a 71 y.o. male with a hx of varicose veins, abnormal EKG (LAFB), CVA, seasonal allergies, dilation of aorta, pulmonary nodule, hyperlipidemia (reluctant to use statins), and cardiomyopathy presents today for annual follow-up visit.  Patient has a history of acute CVA in 2016 after taking a trip to [place].  Work-up at that time showed enlarged left heart however no further work-up was completed.  He was referred to Dr. Radford [name] for evaluation of abnormal EKG and sinus bradycardia with left anterior fascicular block and nonspecific T wave abnormalities.  Echocardiogram 07/2018 showed low normal LVEF 50 to 55%, mild LVH and G1 DD.  The ascending aorta was mildly enlarged at 42 mm with mild MR.  Nuclear stress test at that time showed no ischemia.  CT angio showed ectasia of the ascending thoracic aorta measuring 3.9 cm and 6 mm LLL.  Family history significant for vascular disease with mom having multiple CVAs in father having CHF.  Echocardiogram 12/21 showed reduced LVEF 35% with grade 1 DD, mildly reduced RV function, moderate dilatation of aortic root.  Dr. Radford [name] recommended initiation of Entresto and spironolactone as well as coronary CTA.  He was seen in follow-up 09/04/2020 by Edward Copa, PA.  He was doing well from a volume status at that time.  He denied chest pain, shortness of breath, palpitations, DOE, orthopnea, and edema.  Today, he is doing well from a cardiac standpoint.  He has not had any chest pain, shortness of breath, or any lower extremity edema.  His primary care has been working with [name] to get his [name] covered for a more reasonable price.  He has been compliant with all medications.  We  have provided [name] with appropriate refills today.  Last echocardiogram was in 2021 which showed reduced LVEF 35% with grade 1 DD.  We have reordered an echocardiogram today to recheck his ejection fraction after being on GDMT.  CMP from January showed creatinine 1.28 which is just slightly elevated from baseline.   Reports no shortness of breath nor dyspnea on exertion. Reports no chest pain, pressure, or tightness. No edema, orthopnea, PND. Reports no palpitations.   Past Medical History    Past Medical History:  Diagnosis Date   Abnormal EKG 05/29/2018   Sinus bradycardia and LAFB with TWI V4-V6, III and aVF   Benign essential HTN 05/29/2018   Cardiomyopathy ([place])    a. dx by echo 07/2020.   Dilation of aorta (HCC)    Environmental allergies    Fracture, ribs    H/O: varicose veins    Hearing loss    Hyperlipidemia LDL goal <70    Multiple food allergies    Rash    Seasonal allergies    Sinusitis    Stroke [facility])    s/p tpa   Past Surgical History:  Procedure Laterality Date   COLONOSCOPY     INGUINAL HERNIA REPAIR Left    right hand surgery      Allergies  No Known Allergies  EKGs/Labs/Other Studies Reviewed:   The following studies were reviewed today:  09/30/2020 coronary CTA  IMPRESSION: 1. Coronary artery calcium score 0 Agatston units, suggesting low risk for future  cardiac events.   2.  No significant coronary disease noted.   Edward [name]    2D echo 07/2018 - Left ventricle: The cavity size was normal. There was mild    concentric hypertrophy. Systolic function was normal. The    estimated ejection fraction was in the range of 50% to 55%.    Diffuse hypokinesis. Doppler parameters are consistent with    abnormal left ventricular relaxation (grade 1 diastolic    dysfunction). Doppler parameters are consistent with elevated    ventricular end-diastolic filling pressure.  - Aortic valve: There was no regurgitation.  - Aortic root: The aortic root was  normal in size.  - Ascending aorta: The ascending aorta was mildly dilated measuring    42 mm.  - Mitral valve: There was mild regurgitation.  - Right ventricle: Systolic function was normal.  - Right atrium: The atrium was normal in size.  - Tricuspid valve: There was trivial regurgitation.  - Pulmonary arteries: Systolic pressure was within the normal    range.  - Inferior vena cava: The vessel was normal in size.  - Pericardium, extracardiac: There was no pericardial effusion.    07/2020   1. Left ventricular ejection fraction, by estimation, is 35%. The left  ventricle has moderately decreased function. The left ventricle  demonstrates global hypokinesis. Left ventricular diastolic parameters are  consistent with Grade I diastolic  dysfunction (impaired relaxation).   2. Right ventricular systolic function is mildly reduced. The right  ventricular size is normal. There is normal pulmonary artery systolic  pressure. The estimated right ventricular systolic pressure is 00.9 mmHg.   3. The mitral valve is normal in structure. Trivial mitral valve  regurgitation. No evidence of mitral stenosis.   4. The aortic valve is tricuspid. Aortic valve regurgitation is not  visualized. No aortic stenosis is present.   5. Aortic dilatation noted. There is moderate dilatation of the aortic  root, measuring 44 mm.   6. The inferior vena cava is normal in size with greater than 50%  respiratory variability, suggesting right atrial pressure of 3 mmHg.   CT 07/2019 IMPRESSION: 1. Thoracoabdominal aorta without evidence of aneurysm or dissection. Ectasia of the ascending thoracic aorta measuring 3.9 cm in AP diameter. Recommend annual imaging followup by CTA or MRA. This recommendation follows 2010 ACCF/AHA/AATS/ACR/ASA/SCA/SCAI/SIR/STS/SVM Guidelines for the Diagnosis and Management of Patients with Thoracic Aortic Disease. Circulation.2010; 121: F818-E993. Aortic aneurysm NOS (ICD10-I71.9).    2.  No acute findings in the chest or abdomen.   3. 6 mm nodule over the lateral left lower lobe. Recommend follow-up CT 6 months.   This recommendation follows the consensus statement: Guidelines for Management of Small Pulmonary Nodules Detected on CT Scans: A Statement from the [place] as published in Radiology 2005; 237:395-400. Online at: https://www.arnold.com/.   4. Partially visualized small umbilical hernia containing only peritoneal fat mild colonic diverticulosis.  EKG:  EKG is  ordered today.  The ekg ordered today demonstrates normal sinus rhythm, rate 69 bpm  Recent Labs: 09/02/2021: ALT 25; BUN 16; Creatinine, Ser 1.28; Hemoglobin 13.8; Platelets 281; Potassium 5.0; Sodium 136  Recent Lipid Panel    Component Value Date/Time   CHOL 149 09/02/2021 1105   TRIG 78 09/02/2021 1105   HDL 44 09/02/2021 1105   CHOLHDL 3.4 09/02/2021 1105   LDLCALC 90 09/02/2021 1105    Home Medications   Current Meds  Medication Sig   aspirin EC 81 MG tablet Take 81 mg by mouth daily.  Swallow whole.   Cinnamon 500 MG capsule SMARTSIG:1 Capsule(s) By Mouth   Coenzyme Q10 (COQ-10) 100 MG CAPS SMARTSIG:1 Capsule(s) By Mouth   fluorouracil (EFUDEX) 5 % cream Apply topically as needed.   mupirocin ointment (BACTROBAN) 2 % Apply 1 application. topically as needed.   N-ACETYL CYSTEINE 600 MG TABS SMARTSIG:1 Tablet(s) By Mouth   RA CRANBERRY 500 MG CAPS SMARTSIG:1 Capsule(s) By Mouth   sacubitril-valsartan (ENTRESTO) 24-26 MG Take 1 tablet by mouth 2 (two) times daily.   sildenafil (VIAGRA) 100 MG tablet Take 1 tablet by mouth as needed.   tacrolimus (PROTOPIC) 0.1 % ointment Apply 1 application. topically as needed.   [DISCONTINUED] metoprolol succinate (TOPROL XL) 25 MG 24 hr tablet Take 1 tablet (25 mg total) by mouth daily.   [DISCONTINUED] rosuvastatin (CRESTOR) 10 MG tablet Take 1 tab by mouth MWF   [DISCONTINUED] spironolactone (ALDACTONE)  25 MG tablet Take 0.5 tablets (12.5 mg total) by mouth daily. Please keep upcoming appt in March 2023 with Cardiologist before anymore refills. Thank you     Review of Systems      All other systems reviewed and are otherwise negative except as noted above.  Physical Exam    VS:  BP 100/72    Pulse 69    Ht 6' (1.829 m)    Wt 208 lb 9.6 oz (94.6 kg)    SpO2 98%    BMI 28.29 kg/m  , BMI Body mass index is 28.29 kg/m.  Wt Readings from Last 3 Encounters:  11/05/21 208 lb 9.6 oz (94.6 kg)  08/26/21 206 lb 12.8 oz (93.8 kg)  07/02/21 200 lb 3.2 oz (90.8 kg)     GEN: Well nourished, well developed, in no acute distress. HEENT: normal. Neck: Supple, no JVD, carotid bruits, or masses. Cardiac: RRR, no murmurs, rubs, or gallops. No clubbing, cyanosis, edema.  Radials/PT 2+ and equal bilaterally.  Respiratory:  Respirations regular and unlabored, clear to auscultation bilaterally. GI: Soft, nontender, nondistended. MS: No deformity or atrophy. Skin: Warm and dry, no rash. Neuro:  Strength and sensation are intact. Psych: Normal affect.  Assessment & Plan    Cardiomyopathy -Coronary CTA 09/2020 revealed calcium score 0, no significant coronary artery disease -Last echocardiogram showed EF 35%, 12/21 -Repeat Echocardiogram today  -Continue GDMT: ASA 81 mg, metoprolol succinate 25 mg, Crestor 10 mg, Entresto 24/26 mg, and spironolactone 12.5 mg -Euvolemic on exam today  Ascending Aortic aneurysm -Recent CT scan, maximum 3.5 cm -Continue to maintain tight BP control -BP 100/72 today, asymptomatic  Hyperlipidemia -Last LDL 08/2021 was 90 -LDL goal < 100 -Continue annual surveillance   History of CVA -No residual effects -Continue ASA    Disposition: Follow up 1 year with Edward Him, MD or APP.  Signed, Elgie Collard, PA-C 11/05/2021, 3:59 PM [facility]

## 2021-11-04 NOTE — Chronic Care Management (AMB) (Signed)
? ? ?  Chronic Care Management ?Pharmacy Assistant  ? ?Name: Edward [name]  MRN: [id number] DOB: Aug 02, 1951 ? ?Reason for Encounter: Reschedule appointment ? ?Medications: ?Outpatient Encounter Medications as of 11/04/2021  ?Medication Sig  ? aspirin EC 81 MG tablet Take 81 mg by mouth daily. Swallow whole.  ? fluorouracil (EFUDEX) 5 % cream Apply topically as needed. (Patient not taking: Reported on 08/26/2021)  ? fluticasone (FLONASE) 50 MCG/ACT nasal spray Place 2 sprays into both nostrils daily.  ? metoprolol succinate (TOPROL XL) 25 MG 24 hr tablet Take 1 tablet (25 mg total) by mouth daily.  ? mupirocin ointment (BACTROBAN) 2 % Apply 1 application topically as needed. (Patient not taking: Reported on 08/26/2021)  ? rosuvastatin (CRESTOR) 10 MG tablet Take 1 tab by mouth MWF  ? sacubitril-valsartan (ENTRESTO) 24-26 MG Take 1 tablet by mouth 2 (two) times daily.  ? sildenafil (VIAGRA) 100 MG tablet Take 1 tablet by mouth as needed.  ? spironolactone (ALDACTONE) 25 MG tablet Take 0.5 tablets (12.5 mg total) by mouth daily. Please keep upcoming appt in March 2023 with Cardiologist before anymore refills. Thank you  ? tacrolimus (PROTOPIC) 0.1 % ointment Apply 1 application topically as needed. (Patient not taking: Reported on 08/26/2021)  ? ?No facility-administered encounter medications on file as of 11/04/2021.  ? ?11-04-2021: Rescheduled patient's appointment in April with [name] CPP to October. ? ?Jeannette How CMA ?Clinical Pharmacist Assistant ?[phone] ? ?

## 2021-11-05 ENCOUNTER — Ambulatory Visit: Payer: Medicare HMO | Admitting: Physician Assistant

## 2021-11-05 ENCOUNTER — Encounter: Payer: Self-pay | Admitting: Physician Assistant

## 2021-11-05 ENCOUNTER — Other Ambulatory Visit: Payer: Self-pay

## 2021-11-05 VITALS — BP 100/72 | HR 69 | Ht 72.0 in | Wt 208.6 lb

## 2021-11-05 DIAGNOSIS — I5021 Acute systolic (congestive) heart failure: Secondary | ICD-10-CM | POA: Diagnosis not present

## 2021-11-05 DIAGNOSIS — Z8673 Personal history of transient ischemic attack (TIA), and cerebral infarction without residual deficits: Secondary | ICD-10-CM

## 2021-11-05 DIAGNOSIS — I77810 Thoracic aortic ectasia: Secondary | ICD-10-CM | POA: Diagnosis not present

## 2021-11-05 DIAGNOSIS — E782 Mixed hyperlipidemia: Secondary | ICD-10-CM

## 2021-11-05 DIAGNOSIS — I429 Cardiomyopathy, unspecified: Secondary | ICD-10-CM | POA: Diagnosis not present

## 2021-11-05 MED ORDER — SPIRONOLACTONE 25 MG PO TABS: 12.5000 mg | ORAL_TABLET | Freq: Every day | ORAL | 3 refills | Status: DC

## 2021-11-05 MED ORDER — ROSUVASTATIN CALCIUM 10 MG PO TABS: ORAL_TABLET | ORAL | 3 refills | Status: DC

## 2021-11-05 MED ORDER — METOPROLOL SUCCINATE ER 25 MG PO TB24: 25.0000 mg | ORAL_TABLET | Freq: Every day | ORAL | 3 refills | Status: DC

## 2021-11-05 NOTE — Addendum Note (Signed)
Addended by: Juventino Slovak on: 11/05/2021 04:07 PM ? ? Modules accepted: Orders ? ?

## 2021-11-05 NOTE — Patient Instructions (Signed)
Medication Instructions:  ?Your physician recommends that you continue on your current medications as directed. Please refer to the Current Medication list given to you today.  ?*If you need a refill on your cardiac medications before your next appointment, please call your pharmacy* ? ? ?Lab Work: ?BMP today ?If you have labs (blood work) drawn today and your tests are completely normal, you will receive your results only by: ?MyChart Message (if you have MyChart) OR ?A paper copy in the mail ?If you have any lab test that is abnormal or we need to change your treatment, we will call you to review the results. ? ? ?Testing/Procedures: ?Your physician has requested that you have an echocardiogram. Echocardiography is a painless test that uses sound waves to create images of your heart. It provides your doctor with information about the size and shape of your heart and how well your heart?s chambers and valves are working. This procedure takes approximately one hour. There are no restrictions for this procedure.  ?Check with your insurance regarding your out of pocket portion of this and call us when you are ready to schedule. ? ? ?Follow-Up: ?At [facility], you and your health needs are our priority.  As part of our continuing mission to provide you with exceptional heart care, we have created designated Provider Care Teams.  These Care Teams include your primary Cardiologist (physician) and Advanced Practice Providers (APPs -  Physician Assistants and Nurse Practitioners) who all work together to provide you with the care you need, when you need it. ? ? ?Your next appointment:   ?1 year(s) ? ?The format for your next appointment:   ?In Person ? ?Provider:   ?Fransico Him, MD { ?

## 2021-11-13 ENCOUNTER — Ambulatory Visit (HOSPITAL_COMMUNITY): Payer: Medicare HMO | Attending: Cardiology

## 2021-11-13 ENCOUNTER — Encounter: Payer: Self-pay | Admitting: Cardiology

## 2021-11-13 NOTE — Progress Notes (Unsigned)
Patient ID: Edward [name], male   DOB: 1950/09/29, 71 y.o.   MRN: [id number] ? ?Verified appointment "no show" status with [name] at 4:00pm. ? ? ?

## 2021-11-17 ENCOUNTER — Encounter (HOSPITAL_COMMUNITY): Payer: Self-pay | Admitting: Physician Assistant

## 2021-11-26 ENCOUNTER — Other Ambulatory Visit: Payer: Self-pay | Admitting: Nurse Practitioner

## 2021-11-26 DIAGNOSIS — E782 Mixed hyperlipidemia: Secondary | ICD-10-CM

## 2021-11-28 IMAGING — CT CT ANGIO ABDOMEN
2 of 8 series · 15 of 46 positions shown, 17 images · IV contrast (OMNIPAQUE 350)
Comparison: None.

CLINICAL DATA: Follow-up ascending aortic aneurysm.

EXAM:
CT ANGIOGRAPHY CHEST AND ABDOMEN
TECHNIQUE: Multidetector CT imaging of the chest and abdomen was performed
using the standard protocol during bolus administration of
intravenous contrast. Multiplanar CT image reconstructions and MIPs
were obtained to evaluate the vascular anatomy.
CONTRAST:  100mL OMNIPAQUE IOHEXOL 350 MG/ML SOLN

[Series 4: aorta 3.0 i31f 2 · axial · 0.91mm/px · z∈[+1111,+1522]mm · 12 of 157 slices shown, 14 images]
[im 10/157  soft-tissue]
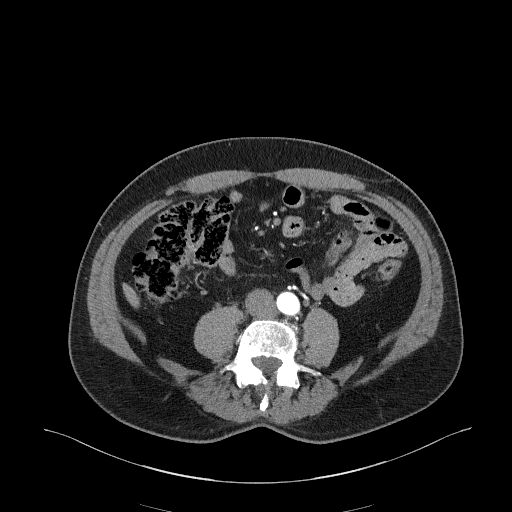
[im 10/157  bone]
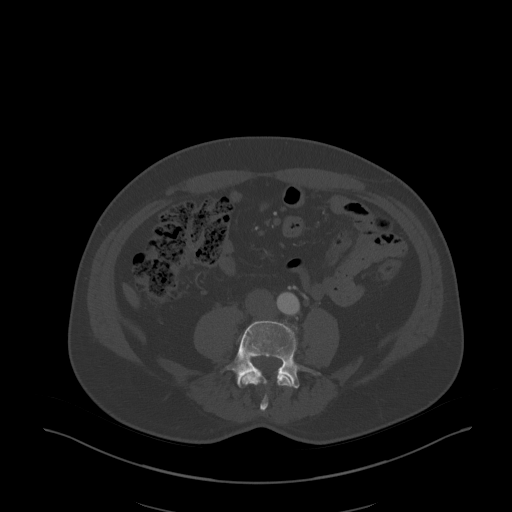
[im 20/157  soft-tissue]
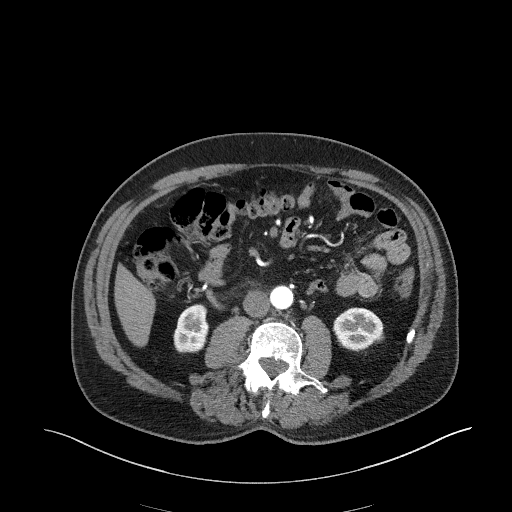
[im 40/157  soft-tissue]
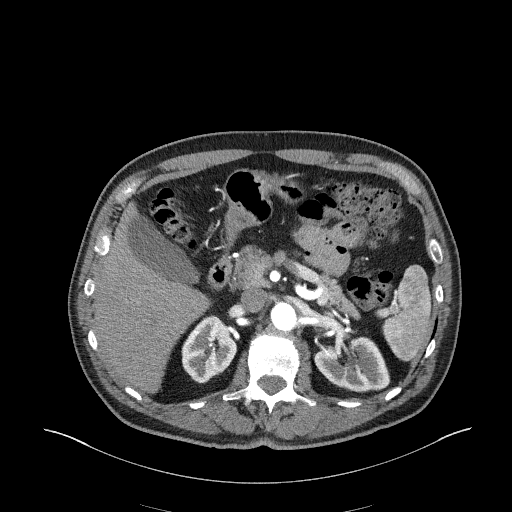
[im 49/157  soft-tissue]
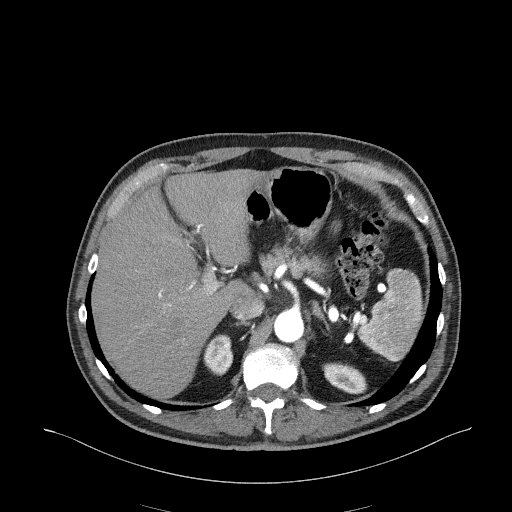
[im 59/157  soft-tissue]
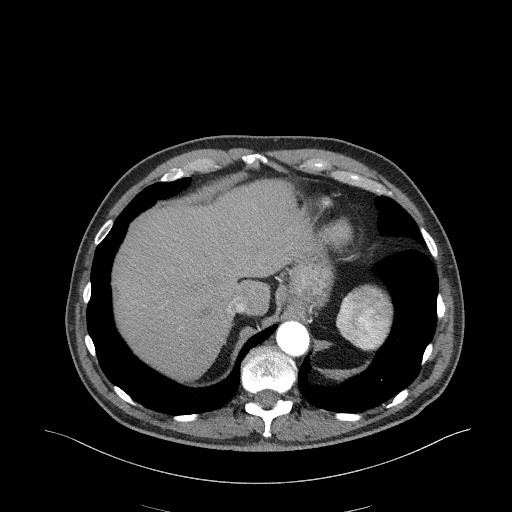
[im 69/157  soft-tissue]
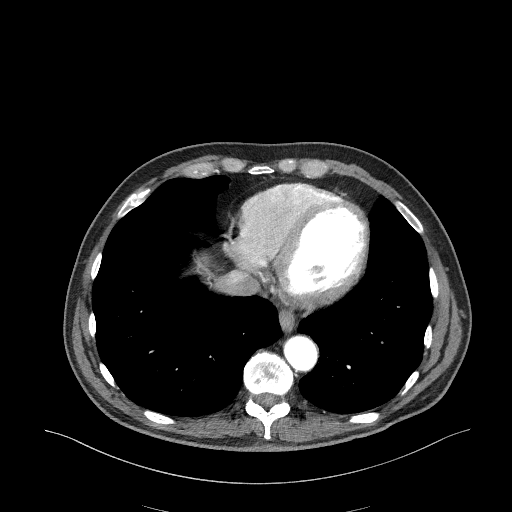
[im 88/157  soft-tissue]
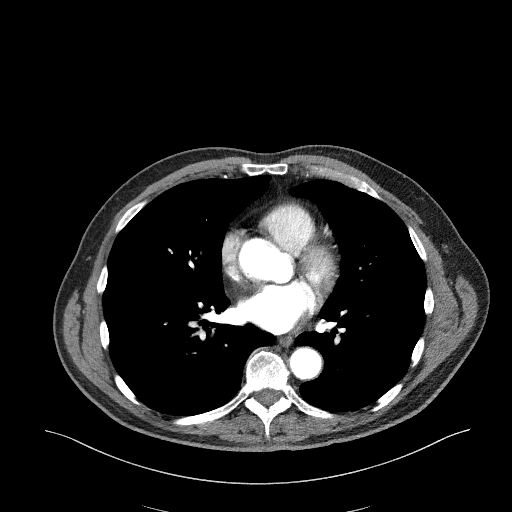
[im 98/157  soft-tissue]
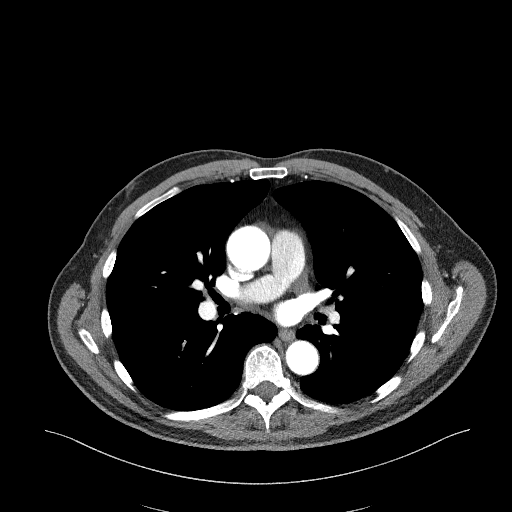
[im 108/157  soft-tissue]
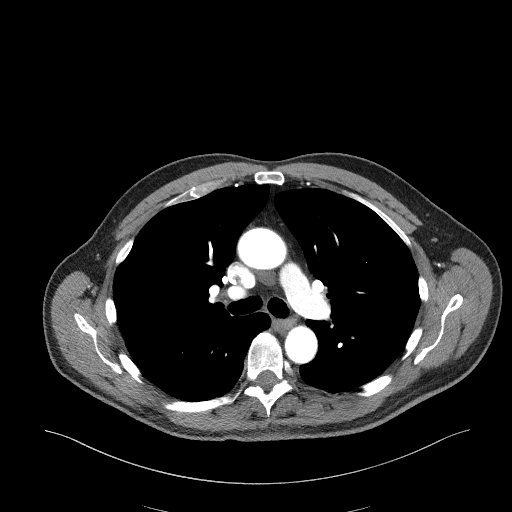
[im 108/157  bone]
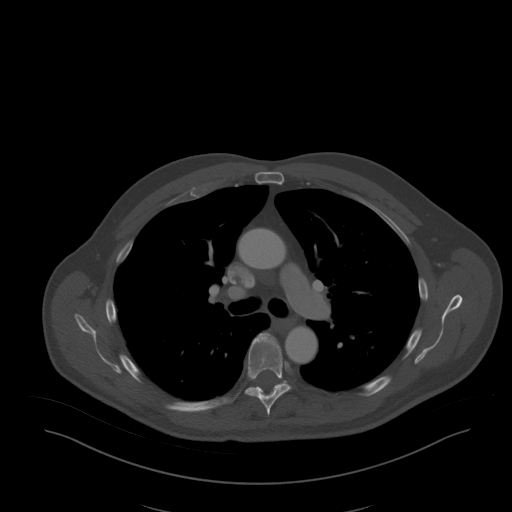
[im 118/157  soft-tissue]
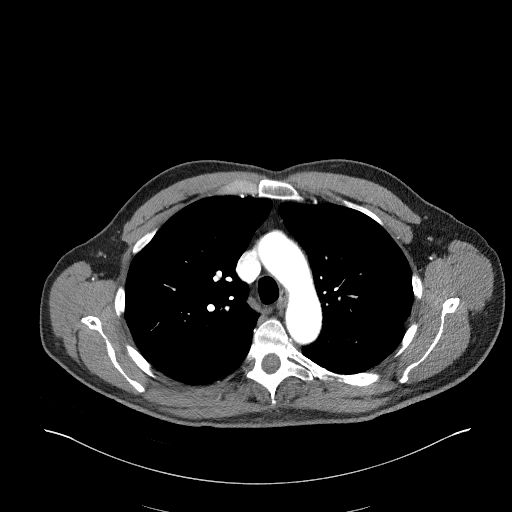
[im 137/157  soft-tissue]
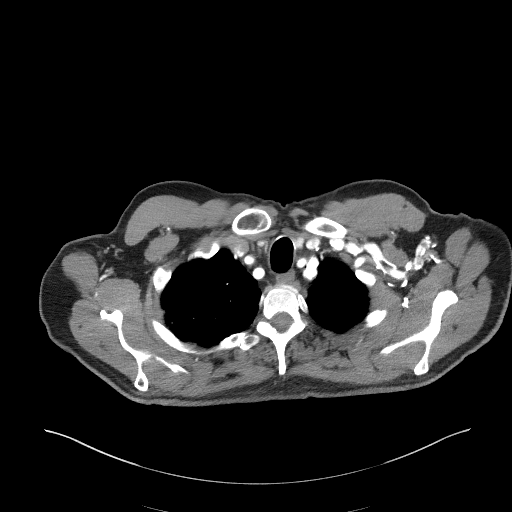
[im 147/157  soft-tissue]
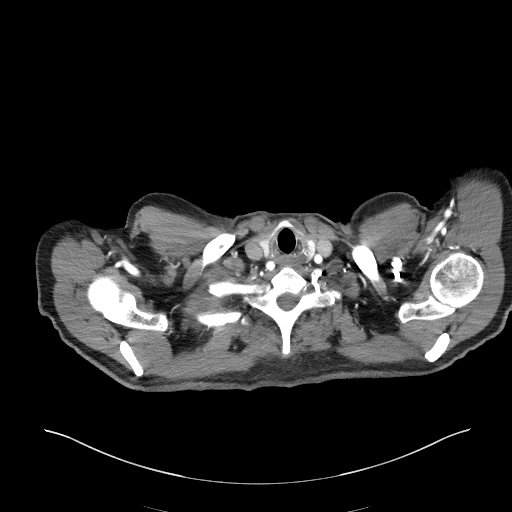

[Series 7: coronals · coronal · 0.75mm/px · 3 of 136 slices shown]
[im 34/136  soft-tissue]
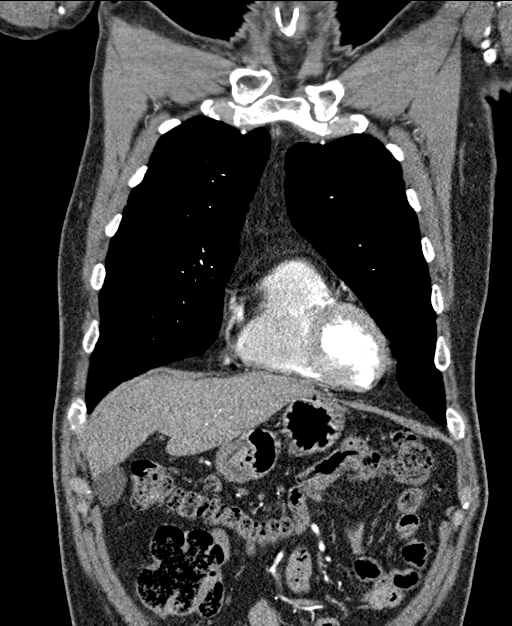
[im 68/136  soft-tissue]
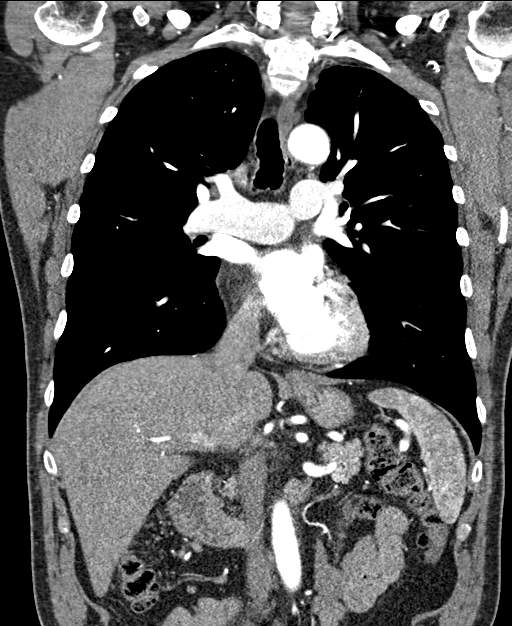
[im 102/136  soft-tissue]
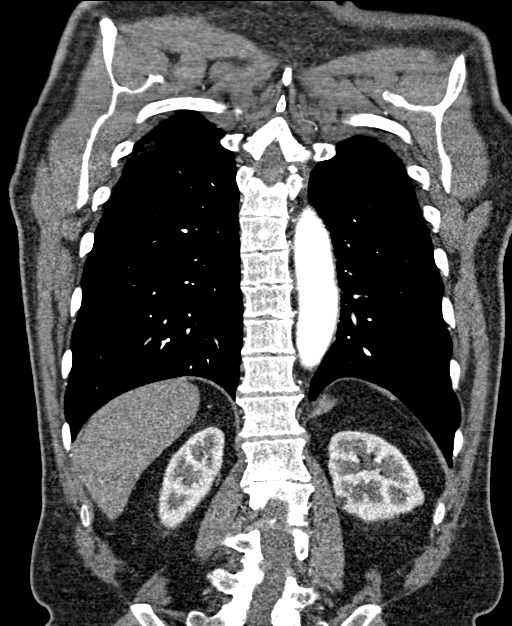

[15 of 46 positions shown; findings below may reference images not displayed]

FINDINGS: CTA CHEST FINDINGS

Cardiovascular: Heart is normal size. Coronary arteries are
unremarkable. Ectasia of the ascending thoracic aorta measuring
cm in greatest AP diameter at the level of the main pulmonary
artery. Aortic root measures 4.5 cm and sinotubular junction
measures 3.4 cm. Aortic arch and descending thoracic aorta are
normal in caliber. No evidence of dissection. Visualized pulmonary
arteries and remaining vascular structures are unremarkable.

Mediastinum/Nodes: No mediastinal or hilar adenopathy. Remaining
mediastinal structures are normal.

Lungs/Pleura: Lungs are adequately inflated without evidence of
effusion or pneumothorax. 6 mm nodule over the lateral left lower
lobe. Airways are normal.

Musculoskeletal: Mild degenerative change of the spine. Moderate
disc disease at a level in the region of the thoracolumbar junction.

Review of the MIP images confirms the above findings.

CTA ABDOMEN FINDINGS

Aorta: Normal in caliber without aneurysm or dissection.

Celiac: Normal. Takeoff of hepatic artery at the origin of the
celiac axis.

SMA: Normal.

Renals: Normal.

IMA: Normal.

Inflow: Visualized proximal common iliac arteries are normal.

Veins: Normal.

Hepatobiliary: Liver, gallbladder and biliary tree are normal.

Pancreas: Normal.

Spleen: Normal.

Adrenals/Urinary Tract: Adrenal glands are normal. Kidneys are
normal size without hydronephrosis or nephrolithiasis. Ureters are
normal.

Stomach/Bowel: Stomach and visualized small bowel are normal.
Appendix not well visualized. Visualized portions of the:
Demonstrate minimal diverticulosis over the descending colon.

Lymphatic: No adenopathy.

Musculoskeletal: Mild degenerative change of the spine

Other: Partially visualized small umbilical hernia containing only
peritoneal fat.

Review of the MIP images confirms the above findings.
IMPRESSION: 1. Thoracoabdominal aorta without evidence of aneurysm or
dissection. Ectasia of the ascending thoracic aorta measuring 3.9 cm
in AP diameter. Recommend annual imaging followup by CTA or MRA.
This recommendation follows 3141
ACCF/AHA/AATS/ACR/ASA/SCA/HIRIE/ARTUR/TIGER/JINHAK Guidelines for the
Diagnosis and Management of Patients with Thoracic Aortic Disease.
Circulation.3141; 121: E266-e369. Aortic aneurysm NOS (K9NIX-AI2.I).

2.  No acute findings in the chest or abdomen.

3. 6 mm nodule over the lateral left lower lobe. Recommend follow-up
CT 6 months.

This recommendation follows the consensus statement: Guidelines for
Management of Small Pulmonary Nodules Detected on CT Scans: A
Statement from the [HOSPITAL] as published in Radiology
6116; [DATE]. Online at:
[URL]

4. Partially visualized small umbilical hernia containing only
peritoneal fat mild colonic diverticulosis.

## 2021-11-28 IMAGING — CT CT ANGIO CHEST
2 of 8 series · 17 of 46 positions shown · IV contrast (OMNIPAQUE 350)
Comparison: None.

CLINICAL DATA: Follow-up ascending aortic aneurysm.

EXAM:
CT ANGIOGRAPHY CHEST AND ABDOMEN
TECHNIQUE: Multidetector CT imaging of the chest and abdomen was performed
using the standard protocol during bolus administration of
intravenous contrast. Multiplanar CT image reconstructions and MIPs
were obtained to evaluate the vascular anatomy.
CONTRAST:  100mL OMNIPAQUE IOHEXOL 350 MG/ML SOLN

[Series 4: aorta 3.0 i31f 2 · axial · 0.91mm/px · z∈[+1111,+1522]mm · 14 of 157 slices shown]
[im 10/157  lung]
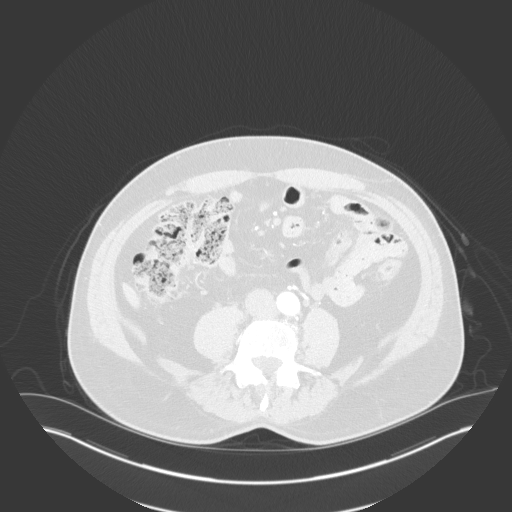
[im 20/157  soft-tissue]
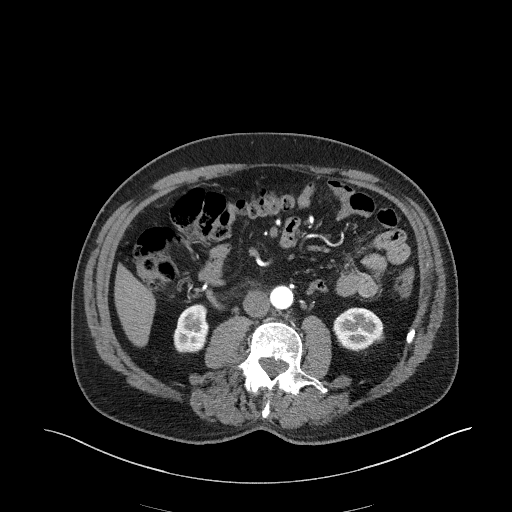
[im 30/157  lung]
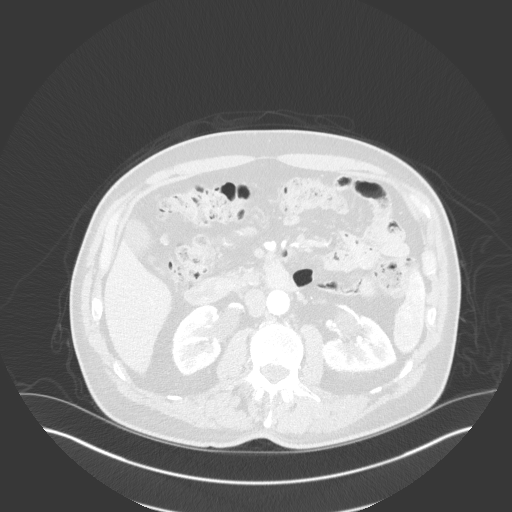
[im 40/157  soft-tissue]
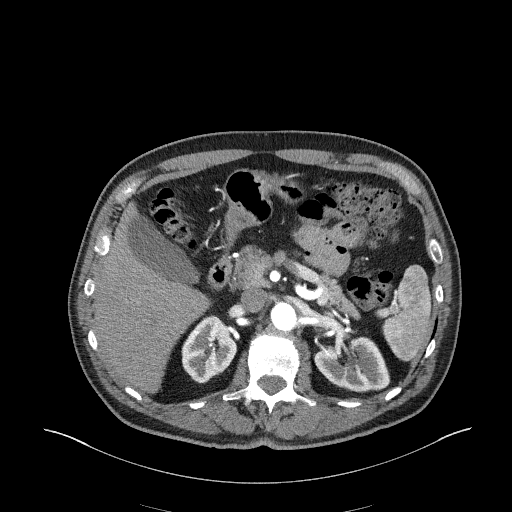
[im 49/157  lung]
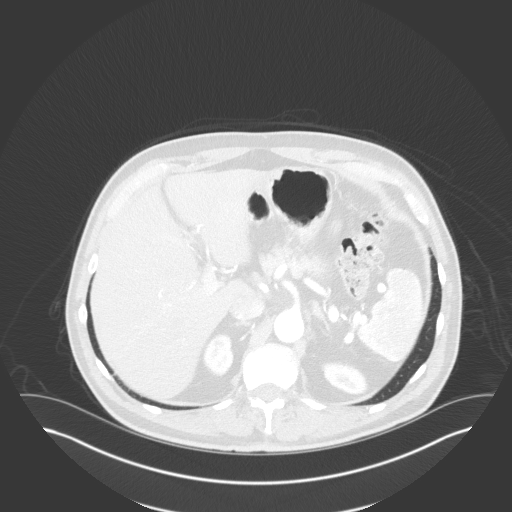
[im 59/157  soft-tissue]
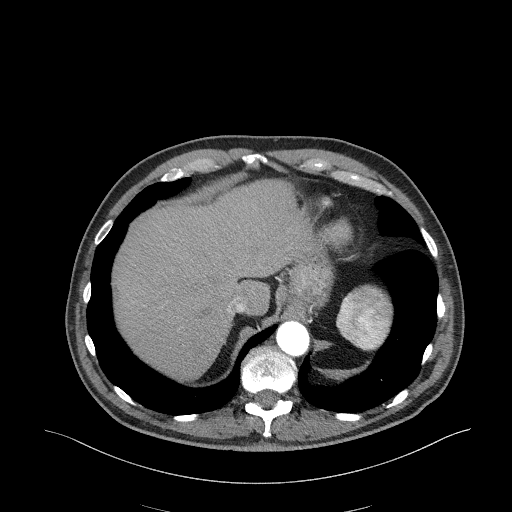
[im 69/157  lung]
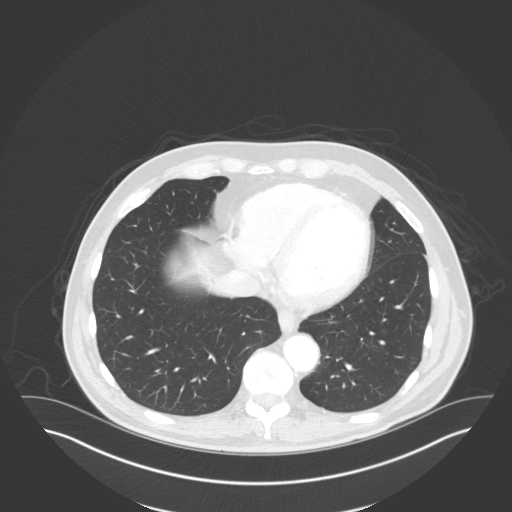
[im 88/157  soft-tissue]
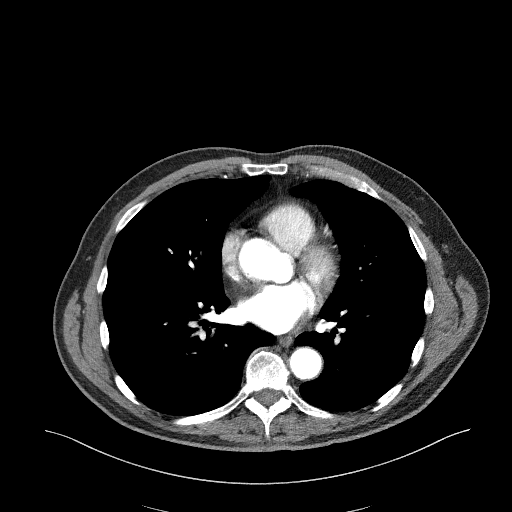
[im 98/157  lung]
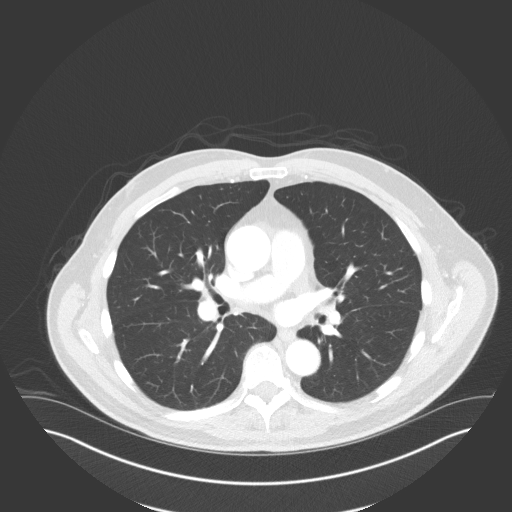
[im 108/157  soft-tissue]
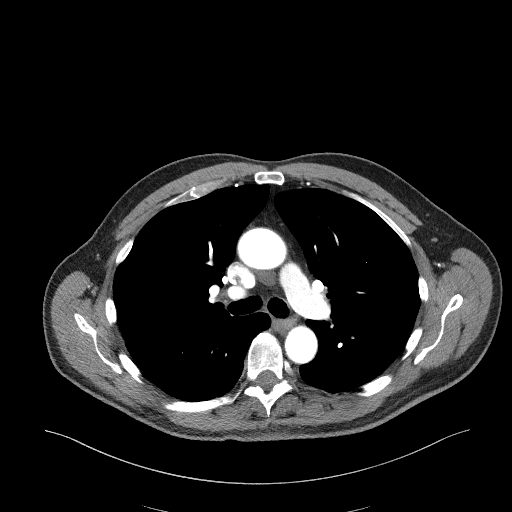
[im 118/157  lung]
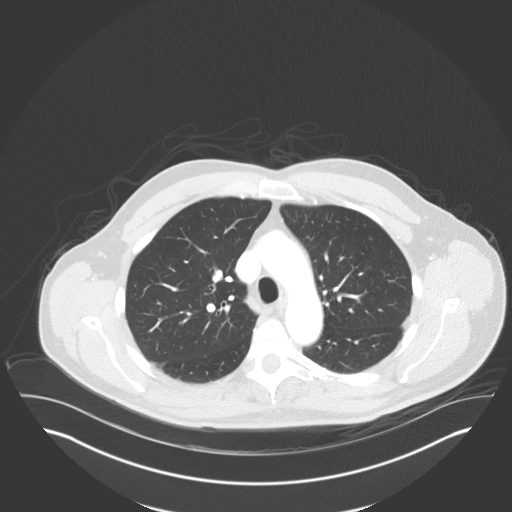
[im 127/157  soft-tissue]
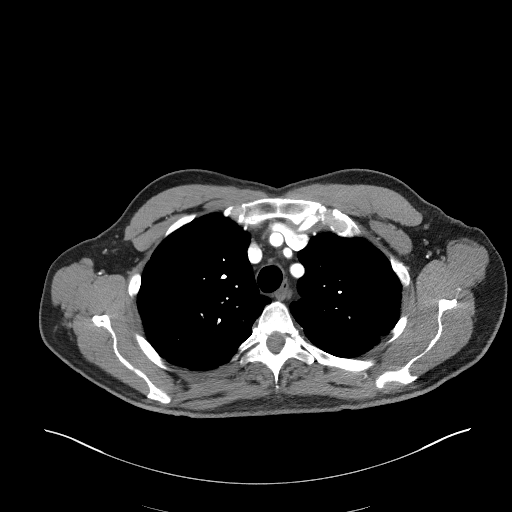
[im 137/157  lung]
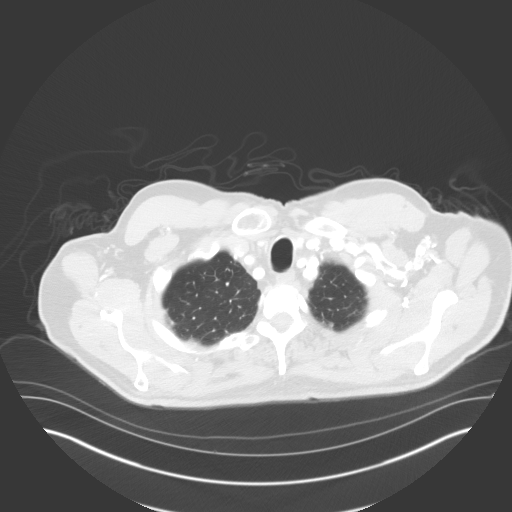
[im 147/157  soft-tissue]
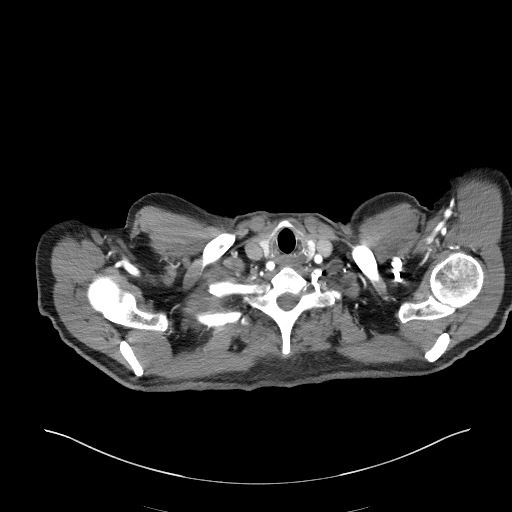

[Series 7: coronals · coronal · 0.75mm/px · 3 of 136 slices shown]
[im 34/136  soft-tissue]
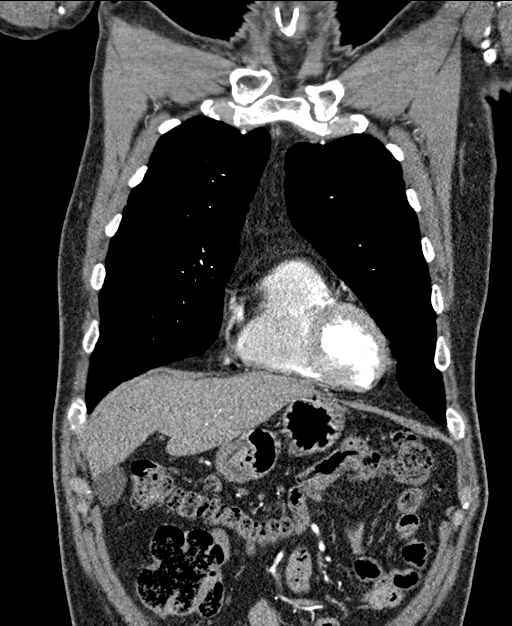
[im 68/136  soft-tissue]
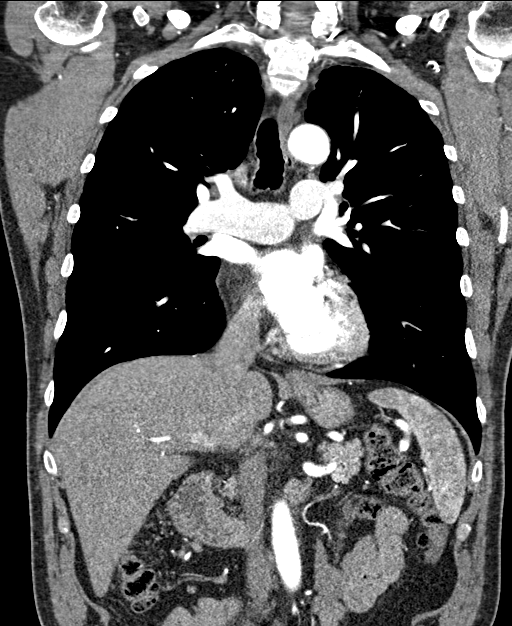
[im 102/136  soft-tissue]
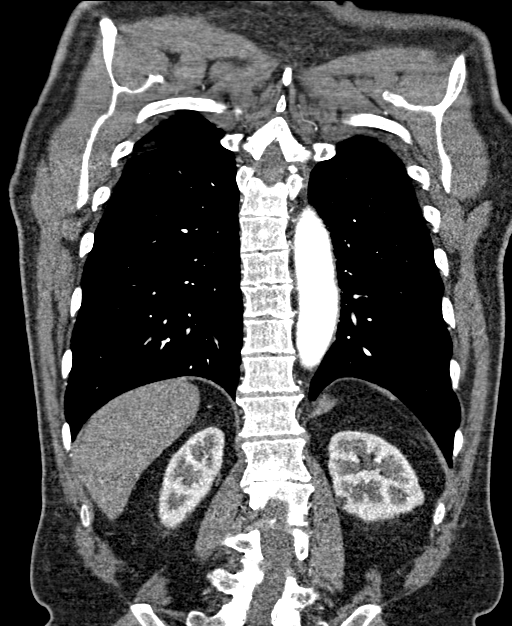

[17 of 46 positions shown; findings below may reference images not displayed]

FINDINGS: CTA CHEST FINDINGS

Cardiovascular: Heart is normal size. Coronary arteries are
unremarkable. Ectasia of the ascending thoracic aorta measuring
cm in greatest AP diameter at the level of the main pulmonary
artery. Aortic root measures 4.5 cm and sinotubular junction
measures 3.4 cm. Aortic arch and descending thoracic aorta are
normal in caliber. No evidence of dissection. Visualized pulmonary
arteries and remaining vascular structures are unremarkable.

Mediastinum/Nodes: No mediastinal or hilar adenopathy. Remaining
mediastinal structures are normal.

Lungs/Pleura: Lungs are adequately inflated without evidence of
effusion or pneumothorax. 6 mm nodule over the lateral left lower
lobe. Airways are normal.

Musculoskeletal: Mild degenerative change of the spine. Moderate
disc disease at a level in the region of the thoracolumbar junction.

Review of the MIP images confirms the above findings.

CTA ABDOMEN FINDINGS

Aorta: Normal in caliber without aneurysm or dissection.

Celiac: Normal. Takeoff of hepatic artery at the origin of the
celiac axis.

SMA: Normal.

Renals: Normal.

IMA: Normal.

Inflow: Visualized proximal common iliac arteries are normal.

Veins: Normal.

Hepatobiliary: Liver, gallbladder and biliary tree are normal.

Pancreas: Normal.

Spleen: Normal.

Adrenals/Urinary Tract: Adrenal glands are normal. Kidneys are
normal size without hydronephrosis or nephrolithiasis. Ureters are
normal.

Stomach/Bowel: Stomach and visualized small bowel are normal.
Appendix not well visualized. Visualized portions of the:
Demonstrate minimal diverticulosis over the descending colon.

Lymphatic: No adenopathy.

Musculoskeletal: Mild degenerative change of the spine

Other: Partially visualized small umbilical hernia containing only
peritoneal fat.

Review of the MIP images confirms the above findings.
IMPRESSION: 1. Thoracoabdominal aorta without evidence of aneurysm or
dissection. Ectasia of the ascending thoracic aorta measuring 3.9 cm
in AP diameter. Recommend annual imaging followup by CTA or MRA.
This recommendation follows 3141
ACCF/AHA/AATS/ACR/ASA/SCA/HIRIE/ARTUR/TIGER/JINHAK Guidelines for the
Diagnosis and Management of Patients with Thoracic Aortic Disease.
Circulation.3141; 121: [id number]. Aortic aneurysm NOS (K9NIX-AI2.I).

2.  No acute findings in the chest or abdomen.

3. 6 mm nodule over the lateral left lower lobe. Recommend follow-up
CT 6 months.

This recommendation follows the consensus statement: Guidelines for
Management of Small Pulmonary Nodules Detected on CT Scans: A
Statement from the [HOSPITAL] as published in Radiology
6116; [DATE]. Online at:
[URL]

4. Partially visualized small umbilical hernia containing only
peritoneal fat mild colonic diverticulosis.

## 2021-12-01 ENCOUNTER — Telehealth: Payer: Self-pay

## 2021-12-01 NOTE — Telephone Encounter (Signed)
The pt was asked if he has been taking his rosuvastatin as directed and the pt said yes he takes his medications as directed.  ?

## 2021-12-02 ENCOUNTER — Ambulatory Visit (HOSPITAL_COMMUNITY): Payer: Medicare HMO | Attending: Cardiovascular Disease

## 2021-12-02 DIAGNOSIS — I5021 Acute systolic (congestive) heart failure: Secondary | ICD-10-CM | POA: Diagnosis not present

## 2021-12-04 ENCOUNTER — Other Ambulatory Visit: Payer: Self-pay | Admitting: Nurse Practitioner

## 2021-12-04 DIAGNOSIS — E782 Mixed hyperlipidemia: Secondary | ICD-10-CM

## 2021-12-08 ENCOUNTER — Telehealth: Payer: Self-pay | Admitting: *Deleted

## 2021-12-08 DIAGNOSIS — I429 Cardiomyopathy, unspecified: Secondary | ICD-10-CM

## 2021-12-08 MED ORDER — DAPAGLIFLOZIN PROPANEDIOL 10 MG PO TABS: 10.0000 mg | ORAL_TABLET | Freq: Every day | ORAL | 6 refills | Status: DC

## 2021-12-08 NOTE — Telephone Encounter (Signed)
Reviewed echo results and recommendations with the patient.  He voices understanding and will pick up and begin Farxiga 10 mg.  In 2 weeks he has an appointment with his PCP.  He will plan to ask for labs there (BMET) and if they are unable to draw, he will schedule to come to our office for the lab work. ? ?30 day free trial offer coupon placed in mail for him to use at some point. ?

## 2021-12-08 NOTE — Telephone Encounter (Signed)
-----   Message from Loel Dubonnet, NP sent at 12/02/2021  3:42 PM EDT ----- ?Echo shows reduced heart pumping function similar compared to previous. No significant valvular abnormalities.  ? ?For optimization of HF therapy recommend addition of Farxiga '10mg'$  daily with repeat BMP in 2 weeks. ? ?Please call patient to discuss. ?

## 2021-12-09 ENCOUNTER — Encounter: Payer: Self-pay | Admitting: Nurse Practitioner

## 2021-12-14 MED ORDER — ENTRESTO 24-26 MG PO TABS: 1.0000 | ORAL_TABLET | Freq: Two times a day (BID) | ORAL | 3 refills | Status: DC

## 2021-12-18 ENCOUNTER — Telehealth: Payer: Medicare HMO

## 2021-12-22 ENCOUNTER — Ambulatory Visit (INDEPENDENT_AMBULATORY_CARE_PROVIDER_SITE_OTHER): Payer: Medicare HMO | Admitting: Nurse Practitioner

## 2021-12-22 ENCOUNTER — Encounter: Payer: Self-pay | Admitting: Nurse Practitioner

## 2021-12-22 VITALS — BP 110/78 | HR 82 | Temp 98.4°F | Ht 72.0 in | Wt 205.4 lb

## 2021-12-22 DIAGNOSIS — I10 Essential (primary) hypertension: Secondary | ICD-10-CM

## 2021-12-22 DIAGNOSIS — Z0000 Encounter for general adult medical examination without abnormal findings: Secondary | ICD-10-CM

## 2021-12-22 DIAGNOSIS — E6609 Other obesity due to excess calories: Secondary | ICD-10-CM

## 2021-12-22 DIAGNOSIS — E782 Mixed hyperlipidemia: Secondary | ICD-10-CM | POA: Diagnosis not present

## 2021-12-22 DIAGNOSIS — Z2821 Immunization not carried out because of patient refusal: Secondary | ICD-10-CM

## 2021-12-22 DIAGNOSIS — Z6827 Body mass index (BMI) 27.0-27.9, adult: Secondary | ICD-10-CM | POA: Diagnosis not present

## 2021-12-22 DIAGNOSIS — R351 Nocturia: Secondary | ICD-10-CM | POA: Diagnosis not present

## 2021-12-22 DIAGNOSIS — K219 Gastro-esophageal reflux disease without esophagitis: Secondary | ICD-10-CM

## 2021-12-22 LAB — POCT URINALYSIS DIPSTICK
Bilirubin, UA: NEGATIVE
Blood, UA: NEGATIVE
Glucose, UA: POSITIVE — AB
Ketones, UA: NEGATIVE
Nitrite, UA: POSITIVE
Protein, UA: NEGATIVE
Spec Grav, UA: 1.01 (ref 1.010–1.025)
Urobilinogen, UA: 0.2 E.U./dL
pH, UA: 5 (ref 5.0–8.0)

## 2021-12-22 NOTE — Patient Instructions (Signed)
Health Maintenance, Male Adopting a healthy lifestyle and getting preventive care are important in promoting health and wellness. Ask your health care provider about: The right schedule for you to have regular tests and exams. Things you can do on your own to prevent diseases and keep yourself healthy. What should I know about diet, weight, and exercise? Eat a healthy diet  Eat a diet that includes plenty of vegetables, fruits, low-fat dairy products, and lean protein. Do not eat a lot of foods that are high in solid fats, added sugars, or sodium. Maintain a healthy weight Body mass index (BMI) is a measurement that can be used to identify possible weight problems. It estimates body fat based on height and weight. Your health care provider can help determine your BMI and help you achieve or maintain a healthy weight. Get regular exercise Get regular exercise. This is one of the most important things you can do for your health. Most adults should: Exercise for at least 150 minutes each week. The exercise should increase your heart rate and make you sweat (moderate-intensity exercise). Do strengthening exercises at least twice a week. This is in addition to the moderate-intensity exercise. Spend less time sitting. Even light physical activity can be beneficial. Watch cholesterol and blood lipids Have your blood tested for lipids and cholesterol at 69 years of age, then have this test every 5 years. You may need to have your cholesterol levels checked more often if: Your lipid or cholesterol levels are high. You are older than 69 years of age. You are at high risk for heart disease. What should I know about cancer screening? Many types of cancers can be detected early and may often be prevented. Depending on your health history and family history, you may need to have cancer screening at various ages. This may include screening for: Colorectal cancer. Prostate cancer. Skin cancer. Lung  cancer. What should I know about heart disease, diabetes, and high blood pressure? Blood pressure and heart disease High blood pressure causes heart disease and increases the risk of stroke. This is more likely to develop in people who have high blood pressure readings or are overweight. Talk with your health care provider about your target blood pressure readings. Have your blood pressure checked: Every 3-5 years if you are 18-39 years of age. Every year if you are 40 years old or older. If you are between the ages of 65 and 75 and are a current or former smoker, ask your health care provider if you should have a one-time screening for abdominal aortic aneurysm (AAA). Diabetes Have regular diabetes screenings. This checks your fasting blood sugar level. Have the screening done: Once every three years after age 45 if you are at a normal weight and have a low risk for diabetes. More often and at a younger age if you are overweight or have a high risk for diabetes. What should I know about preventing infection? Hepatitis B If you have a higher risk for hepatitis B, you should be screened for this virus. Talk with your health care provider to find out if you are at risk for hepatitis B infection. Hepatitis C Blood testing is recommended for: Everyone born from 1945 through 1965. Anyone with known risk factors for hepatitis C. Sexually transmitted infections (STIs) You should be screened each year for STIs, including gonorrhea and chlamydia, if: You are sexually active and are younger than 69 years of age. You are older than 69 years of age and your   health care provider tells you that you are at risk for this type of infection. Your sexual activity has changed since you were last screened, and you are at increased risk for chlamydia or gonorrhea. Ask your health care provider if you are at risk. Ask your health care provider about whether you are at high risk for HIV. Your health care provider  may recommend a prescription medicine to help prevent HIV infection. If you choose to take medicine to prevent HIV, you should first get tested for HIV. You should then be tested every 3 months for as long as you are taking the medicine. Follow these instructions at home: Alcohol use Do not drink alcohol if your health care provider tells you not to drink. If you drink alcohol: Limit how much you have to 0-2 drinks a day. Know how much alcohol is in your drink. In the [place] one drink equals one 12 oz bottle of beer (355 mL), one 5 oz glass of wine (148 mL), or one 1 oz glass of hard liquor (44 mL). Lifestyle Do not use any products that contain nicotine or tobacco. These products include cigarettes, chewing tobacco, and vaping devices, such as e-cigarettes. If you need help quitting, ask your health care provider. Do not use street drugs. Do not share needles. Ask your health care provider for help if you need support or information about quitting drugs. General instructions Schedule regular health, dental, and eye exams. Stay current with your vaccines. Tell your health care provider if: You often feel depressed. You have ever been abused or do not feel safe at home. Summary Adopting a healthy lifestyle and getting preventive care are important in promoting health and wellness. Follow your health care provider's instructions about healthy diet, exercising, and getting tested or screened for diseases. Follow your health care provider's instructions on monitoring your cholesterol and blood pressure. This information is not intended to replace advice given to you by your health care provider. Make sure you discuss any questions you have with your health care provider. Document Revised: 01/05/2021 Document Reviewed: 01/05/2021 Elsevier Patient Education  2023 Elsevier Inc.  

## 2021-12-22 NOTE — Progress Notes (Signed)
?I,Yamilka J Llittleton,acting as a Education administrator for [employer], FNP.,have documented all relevant documentation on the behalf of [name], FNP,as directed by  [name], FNP while in the presence of [name], [name].  ? ?This visit occurred during the SARS-CoV-2 public health emergency.  Safety protocols were in place, including screening questions prior to the visit, additional usage of staff PPE, and extensive cleaning of exam room while observing appropriate contact time as indicated for disinfecting solutions. ? ?Subjective:  ?  ? Patient ID: Edward [name] , male    DOB: [birth year] , 71 y.o.   MRN: [id number] ? ? ?Chief Complaint  ?Patient presents with  ? Annual Exam  ? ? ?HPI ? ?Patient presents today for a physical. He reports compliance with his meds. Patient has no concerns or questions today. He has had Farxiga to be added by Cardiology. He called his urologist due to straining when urinating. He has made some changes with intake of alcohol and feeling "reflux".  Denies unexplained weight loss  ? ?Wt Readings from Last 3 Encounters: ?12/22/21 : 205 lb 6.4 oz (93.2 kg) ?11/05/21 : 208 lb 9.6 oz (94.6 kg) ?08/26/21 : 206 lb 12.8 oz (93.8 kg) ?  ?  ? ?Past Medical History:  ?Diagnosis Date  ? Abnormal EKG 05/29/2018  ? 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 Benign essential HTN 05/29/2018  ? Cardiomyopathy ([place])   ? a. dx by echo 07/2020.  ? Dilation of aorta ([place])   ? Environmental allergies   ? Fracture, ribs   ? H/O: varicose veins   ? Hearing loss   ? Hyperlipidemia LDL goal <70   ? Multiple food allergies   ? Rash   ? Seasonal allergies   ? Sinusitis   ? Stroke [facility])   ? s/p tpa  ?  ? ?Family History  ?Problem Relation Age of Onset  ? CVA Mother   ? Heart failure Father   ?     CHF  ? Brain cancer Brother   ?     GBM  ? Colon polyps Neg Hx   ? Colon cancer Neg Hx   ? Esophageal cancer Neg Hx   ? Rectal cancer Neg Hx   ? Stomach cancer Neg Hx   ? ? ? ?Current Outpatient Medications:  ?   aspirin EC 81 MG tablet, Take 81 mg by mouth daily. Swallow whole., Disp: , Rfl:  ?  Cinnamon 500 MG capsule, SMARTSIG:1 Capsule(s) By Mouth, Disp: , Rfl:  ?  Coenzyme Q10 (COQ-10) 100 MG CAPS, SMARTSIG:1 Capsule(s) By Mouth, Disp: , Rfl:  ?  dapagliflozin propanediol (FARXIGA) 10 MG TABS tablet, Take 1 tablet (10 mg total) by mouth daily before breakfast., Disp: 30 tablet, Rfl: 6 ?  fluorouracil (EFUDEX) 5 % cream, Apply topically as needed., Disp: , Rfl:  ?  metoprolol succinate (TOPROL XL) 25 MG 24 hr tablet, Take 1 tablet (25 mg total) by mouth daily., Disp: 90 tablet, Rfl: 3 ?  mupirocin ointment (BACTROBAN) 2 %, Apply 1 application. topically as needed., Disp: , Rfl:  ?  N-ACETYL CYSTEINE 600 MG TABS, SMARTSIG:1 Tablet(s) By Mouth, Disp: , Rfl:  ?  RA CRANBERRY 500 MG CAPS, SMARTSIG:1 Capsule(s) By Mouth, Disp: , Rfl:  ?  rosuvastatin (CRESTOR) 10 MG tablet, TAKE 1 TABLET BY MOUTH EVERY DAY, Disp: 45 tablet, Rfl: 1 ?  sacubitril-valsartan (ENTRESTO) 24-26 MG, Take 1 tablet by mouth 2 (two) times daily., Disp: 180  tablet, Rfl: 3 ?  sildenafil (VIAGRA) 100 MG tablet, Take 1 tablet by mouth as needed., Disp: , Rfl:  ?  spironolactone (ALDACTONE) 25 MG tablet, Take 0.5 tablets (12.5 mg total) by mouth daily., Disp: 45 tablet, Rfl: 3 ?  tacrolimus (PROTOPIC) 0.1 % ointment, Apply 1 application. topically as needed., Disp: , Rfl:  ?  fluticasone (FLONASE) 50 MCG/ACT nasal spray, Place 2 sprays into both nostrils daily. (Patient not taking: Reported on 11/05/2021), Disp: 16 g, Rfl: 2  ? ?No Known Allergies  ? ?Men's preventive visit. Patient Health Questionnaire (PHQ-2) is  ?Flowsheet Row Clinical Support from 06/17/2021 in [facility]  ?PHQ-2 Total Score 0  ? ?  ?Patient is on a regular diet. Marital status: Married. Relevant history for alcohol use is:  ?Social History  ? ?Substance and Sexual Activity  ?Alcohol Use Yes  ? Comment: 2 times per week  ? ?Relevant history for tobacco use is:   ?Social History  ? ?Tobacco Use  ?Smoking Status Former  ?Smokeless Tobacco Never  ?Tobacco Comments  ? quit 40+ years ago  ?.  ? ?Review of Systems  ?Constitutional: Negative.   ?HENT: Negative.    ?Eyes: Negative.   ?Respiratory: Negative.    ?Cardiovascular: Negative.   ?Gastrointestinal: Negative.   ?Endocrine: Negative.   ?Genitourinary: Negative.   ?Musculoskeletal: Negative.   ?Skin: Negative.   ?Allergic/Immunologic: Negative.   ?Neurological: Negative.   ?Hematological: Negative.   ?Psychiatric/Behavioral: Negative.     ? ?Today's Vitals  ? 12/22/21 1447  ?BP: 110/78  ?Pulse: 82  ?Temp: 98.4 ?F (36.9 ?C)  ?Weight: 205 lb 6.4 oz (93.2 kg)  ?Height: 6' (1.829 m)  ?PainSc: 0-No pain  ? ?Body mass index is 27.86 kg/m?.  ? ?Objective:  ?Physical Exam ?Vitals reviewed.  ?Constitutional:   ?   General: He is not in acute distress. ?   Appearance: Normal appearance.  ?HENT:  ?   Head: Normocephalic and atraumatic.  ?   Right Ear: Tympanic membrane, ear canal and external ear normal. There is no impacted cerumen.  ?   Left Ear: Tympanic membrane, ear canal and external ear normal. There is no impacted cerumen.  ?   Nose:  ?   Comments: Deferred - masked ?   Mouth/Throat:  ?   Comments: Deferred - masked ?Eyes:  ?   Pupils: Pupils are equal, round, and reactive to light.  ?Cardiovascular:  ?   Rate and Rhythm: Normal rate and regular rhythm.  ?   Pulses: Normal pulses.  ?   Heart sounds: Normal heart sounds. No murmur heard. ?Pulmonary:  ?   Effort: Pulmonary effort is normal. No respiratory distress.  ?   Breath sounds: Normal breath sounds.  ?Abdominal:  ?   General: Abdomen is flat. Bowel sounds are normal. There is no distension.  ?   Palpations: Abdomen is soft.  ?Genitourinary: ?   Comments: Deferred - seen by urology ?Musculoskeletal:     ?   General: No swelling or tenderness. Normal range of motion.  ?   Cervical back: Normal range of motion and neck supple.  ?Skin: ?   General: Skin is warm and dry.  ?    Capillary Refill: Capillary refill takes less than 2 seconds.  ?   Findings: No rash.  ?Neurological:  ?   General: No focal deficit present.  ?   Mental Status: He is alert and oriented to person, place, and time.  ?Psychiatric:     ?  Mood and Affect: Mood normal.     ?   Behavior: Behavior normal.     ?   Thought Content: Thought content normal.     ?   Judgment: Judgment normal.  ?  ? ?   ?Assessment And Plan:  ?  ?1. Encounter for annual physical exam ?Behavior modifications discussed and diet history reviewed.   ?Pt will continue to exercise regularly and modify diet with low GI, plant based foods and decrease intake of processed foods.  ?Recommend intake of daily multivitamin, Vitamin D, and calcium.  ?Recommend mammogram and colonoscopy for preventive screenings, as well as recommend immunizations that include influenza, TDAP, and Shingles (declines) ? ?2. Herpes zoster vaccination declined ? ?3. Pneumococcal vaccination declined ? ?4. BMI 27.0-27.9,adult ?Encouraged to continue exercising regularly at least 150 minutes a week ? ?5. Benign essential HTN ?Comments: Blood pressure is well controlled. Continue current medications ?- POCT Urinalysis Dipstick (49826) ?- Microalbumin / creatinine urine ratio ?- CMP14+EGFR ?- Lipid panel ?- CBC ?- PSA ? ?6. Mixed hyperlipidemia ?Comments: Stable, continue statin, tolerating well ?- POCT Urinalysis Dipstick (41583) ?- Microalbumin / creatinine urine ratio ?- CMP14+EGFR ?- Lipid panel ?- CBC ?- PSA ? ?7. Gastroesophageal reflux disease without esophagitis ?Comments: Doing well, encouraged to limit intake of high fatty foods and spicy foods.  ? ? ?Patient was given opportunity to ask questions. Patient verbalized understanding of the plan and was able to repeat key elements of the plan. All questions were answered to their satisfaction.  ? ?[name], FNP  ? ?I, [name], FNP, have reviewed all documentation for this visit. The documentation on 12/22/21 for  the exam, diagnosis, procedures, and orders are all accurate and complete.  ? ?THE PATIENT IS ENCOURAGED TO PRACTICE SOCIAL DISTANCING DUE TO THE COVID-19 PANDEMIC.   ?

## 2021-12-23 LAB — CBC
Hematocrit: 41.7 % (ref 37.5–51.0)
Hemoglobin: 14.3 g/dL (ref 13.0–17.7)
MCH: 32.4 pg (ref 26.6–33.0)
MCHC: 34.3 g/dL (ref 31.5–35.7)
MCV: 94 fL (ref 79–97)
Platelets: 293 10*3/uL (ref 150–450)
RBC: 4.42 x10E6/uL (ref 4.14–5.80)
RDW: 12 % (ref 11.6–15.4)
WBC: 6.3 10*3/uL (ref 3.4–10.8)

## 2021-12-23 LAB — LIPID PANEL
Chol/HDL Ratio: 4.3 ratio (ref 0.0–5.0)
Cholesterol, Total: 183 mg/dL (ref 100–199)
HDL: 43 mg/dL (ref >39)
LDL Chol Calc (NIH): 118 mg/dL — ABNORMAL HIGH (ref 0–99)
Triglycerides: 123 mg/dL (ref 0–149)
VLDL Cholesterol Cal: 22 mg/dL (ref 5–40)

## 2021-12-23 LAB — MICROALBUMIN / CREATININE URINE RATIO
Creatinine, Urine: 32.7 mg/dL
Microalb/Creat Ratio: <9 mg/g creat (ref 0–29)
Microalbumin, Urine: <3 ug/mL

## 2021-12-23 LAB — CMP14+EGFR
ALT: 26 IU/L (ref 0–44)
AST: 24 IU/L (ref 0–40)
Albumin/Globulin Ratio: 2 (ref 1.2–2.2)
Albumin: 4.6 g/dL (ref 3.8–4.8)
Alkaline Phosphatase: 53 IU/L (ref 44–121)
BUN/Creatinine Ratio: 15 (ref 10–24)
BUN: 19 mg/dL (ref 8–27)
Bilirubin Total: 0.5 mg/dL (ref 0.0–1.2)
CO2: 24 mmol/L (ref 20–29)
Calcium: 9.5 mg/dL (ref 8.6–10.2)
Chloride: 99 mmol/L (ref 96–106)
Creatinine, Ser: 1.25 mg/dL (ref 0.76–1.27)
Globulin, Total: 2.3 g/dL (ref 1.5–4.5)
Glucose: 77 mg/dL (ref 70–99)
Potassium: 4.7 mmol/L (ref 3.5–5.2)
Sodium: 139 mmol/L (ref 134–144)
Total Protein: 6.9 g/dL (ref 6.0–8.5)
eGFR: 62 mL/min/{1.73_m2} (ref >59)

## 2021-12-23 LAB — PSA: Prostate Specific Ag, Serum: 2.8 ng/mL (ref 0.0–4.0)

## 2021-12-28 ENCOUNTER — Ambulatory Visit: Payer: Medicare HMO | Admitting: Nurse Practitioner

## 2021-12-29 ENCOUNTER — Telehealth: Payer: Self-pay

## 2021-12-29 NOTE — Chronic Care Management (AMB) (Signed)
? ? ?Chronic Care Management ?Pharmacy Assistant  ? ?Name: Griff Badley  MRN: [id number] DOB: [birth year] ? ?Reason for Encounter: Disease State/ Cholesterol ? ?Recent office visits:  ?12-22-2021 [name], [place]. LDL= 118. Abnormal UA. EKG completed. ? ?Recent consult visits:  ?11-05-2021 Miguel Aschoff (Cardiology).Echocardiogram ordered. EKG completed. ? ?Hospital visits:  ?None in previous 6 months ? ?Medications: ?Outpatient Encounter Medications as of 12/29/2021  ?Medication Sig  ? aspirin EC 81 MG tablet Take 81 mg by mouth daily. Swallow whole.  ? Cinnamon 500 MG capsule SMARTSIG:1 Capsule(s) By Mouth  ? Coenzyme Q10 (COQ-10) 100 MG CAPS SMARTSIG:1 Capsule(s) By Mouth  ? dapagliflozin propanediol (FARXIGA) 10 MG TABS tablet Take 1 tablet (10 mg total) by mouth daily before breakfast.  ? fluorouracil (EFUDEX) 5 % cream Apply topically as needed.  ? fluticasone (FLONASE) 50 MCG/ACT nasal spray Place 2 sprays into both nostrils daily. (Patient not taking: Reported on 11/05/2021)  ? metoprolol succinate (TOPROL XL) 25 MG 24 hr tablet Take 1 tablet (25 mg total) by mouth daily.  ? mupirocin ointment (BACTROBAN) 2 % Apply 1 application. topically as needed.  ? N-ACETYL CYSTEINE 600 MG TABS SMARTSIG:1 Tablet(s) By Mouth  ? RA CRANBERRY 500 MG CAPS SMARTSIG:1 Capsule(s) By Mouth  ? rosuvastatin (CRESTOR) 10 MG tablet TAKE 1 TABLET BY MOUTH EVERY DAY  ? sacubitril-valsartan (ENTRESTO) 24-26 MG Take 1 tablet by mouth 2 (two) times daily.  ? sildenafil (VIAGRA) 100 MG tablet Take 1 tablet by mouth as needed.  ? spironolactone (ALDACTONE) 25 MG tablet Take 0.5 tablets (12.5 mg total) by mouth daily.  ? tacrolimus (PROTOPIC) 0.1 % ointment Apply 1 application. topically as needed.  ? ?No facility-administered encounter medications on file as of 12/29/2021.  ? ?12/29/2021 ?Name: Trevonne Nyland MRN: [id number] DOB: 1950-09-06 ?Yer Olivencia is a 71 y.o. year old male who is a primary care patient of [name], [place].  ?Comprehensive  medication review performed; Spoke to patient regarding cholesterol ? ?Lipid Panel ?   ?Component Value Date/Time  ? CHOL 183 12/22/2021 1535  ? TRIG 123 12/22/2021 1535  ? HDL 43 12/22/2021 1535  ? [place] 118 (H) 12/22/2021 1535  ?  ?10-year ASCVD risk score: The 10-year ASCVD risk score (Arnett DK, et al., 2019) is: 15.6% ?  Values used to calculate the score: ?    Age: 71 years ?    Sex: Male ?    Is Non-Hispanic African American: No ?    Diabetic: No ?    Tobacco smoker: No ?    Systolic Blood Pressure: 122 mmHg ?    Is BP treated: Yes ?    HDL Cholesterol: 43 mg/dL ?    Total Cholesterol: 183 mg/dL ? ?Current antihyperlipidemic regimen:  ?Rosuvastatin 10 mg daily ? ?Previous antihyperlipidemic medications tried: Atorvastatin ? ?ASCVD risk enhancing conditions: age >36 and HTN ?What recent interventions/DTPs have been made by any provider to improve Cholesterol control since last CPP Visit:  ?Educated on Cholesterol goals;  ?Importance of limiting foods high in cholesterol; ?-Recommended to continue current medication ? ?Any recent hospitalizations or ED visits since last visit with CPP? No ? ?What diet changes have been made to improve Cholesterol?  ?Patient stated he has limited fried/greasy food intake and drinks plenty of water. ? ?What exercise is being done to improve Cholesterol?  ?Patient stated he walks daily. ? ?Adherence Review: ?Does the patient have >5 day gap between last estimated fill dates? No ? ?NOTES: ?Entresto and  Wilder Glade is covered through insurance. ? ?Care Gaps: ?AWV 06-23-2022 ? ?Star Rating Drugs: ?Farxiga 10 mg- [facility]16[facility]2021 [facility] ?Rosuvastatin 10 mg- Last filled [facility]-04-2021 [facility] ?Entresto 24-26 mg- Last filled [facility]-14-2021 [facility] ? ?Malecca Hicks CMA ?Clinical Pharmacist Assistant ?[phone] ? ?

## 2022-01-06 ENCOUNTER — Telehealth: Payer: Self-pay

## 2022-01-06 NOTE — Chronic Care Management (AMB) (Signed)
? ? ?  Chronic Care Management ?Pharmacy Assistant  ? ?Name: Edward [name]  MRN: [id number] DOB: [birth year] ? ?Reason for Encounter: Patient Assistance Coordination ? ?Patient assistance applications filled out for [place] with [employer] and [name] with [employer] patient assistance program. Patient will need to have Cardiologist sign for applications. Mailing applications to patient. ? ?Medications: ?Outpatient Encounter Medications as of 01/06/2022  ?Medication Sig  ? aspirin EC 81 MG tablet Take 81 mg by mouth daily. Swallow whole.  ? Cinnamon 500 MG capsule SMARTSIG:1 Capsule(s) By Mouth  ? Coenzyme Q10 (COQ-10) 100 MG CAPS SMARTSIG:1 Capsule(s) By Mouth  ? dapagliflozin propanediol (FARXIGA) 10 MG TABS tablet Take 1 tablet (10 mg total) by mouth daily before breakfast.  ? fluorouracil (EFUDEX) 5 % cream Apply topically as needed.  ? fluticasone (FLONASE) 50 MCG/ACT nasal spray Place 2 sprays into both nostrils daily. (Patient not taking: Reported on 11/05/2021)  ? metoprolol succinate (TOPROL XL) 25 MG 24 hr tablet Take 1 tablet (25 mg total) by mouth daily.  ? mupirocin ointment (BACTROBAN) 2 % Apply 1 application. topically as needed.  ? N-ACETYL CYSTEINE 600 MG TABS SMARTSIG:1 Tablet(s) By Mouth  ? RA CRANBERRY 500 MG CAPS SMARTSIG:1 Capsule(s) By Mouth  ? rosuvastatin (CRESTOR) 10 MG tablet TAKE 1 TABLET BY MOUTH EVERY DAY  ? sacubitril-valsartan (ENTRESTO) 24-26 MG Take 1 tablet by mouth 2 (two) times daily.  ? sildenafil (VIAGRA) 100 MG tablet Take 1 tablet by mouth as needed.  ? spironolactone (ALDACTONE) 25 MG tablet Take 0.5 tablets (12.5 mg total) by mouth daily.  ? tacrolimus (PROTOPIC) 0.1 % ointment Apply 1 application. topically as needed.  ? ?No facility-administered encounter medications on file as of 01/06/2022.  ? ?[name], CMA ?Clinical Pharmacist Assistant ?[phone] ? ?

## 2022-01-12 DIAGNOSIS — N5201 Erectile dysfunction due to arterial insufficiency: Secondary | ICD-10-CM | POA: Diagnosis not present

## 2022-01-12 DIAGNOSIS — R3914 Feeling of incomplete bladder emptying: Secondary | ICD-10-CM | POA: Diagnosis not present

## 2022-02-24 ENCOUNTER — Telehealth: Payer: Self-pay

## 2022-02-24 MED ORDER — ROSUVASTATIN CALCIUM 10 MG PO TABS: 10.0000 mg | ORAL_TABLET | ORAL | 1 refills | Status: DC

## 2022-02-24 NOTE — Telephone Encounter (Addendum)
  Chronic Care Management   Medication Adherence Note   02/24/2022 Name: Edward [name] MRN: [id number] DOB: 20-Feb-1951  Referred by: [name], FNP Reason for referral : No chief complaint on file.   Edward [name] is a 71 y.o. year old male who is a primary care patient of [name], [place]. The CCM team was consulted for assistance with chronic disease management and care coordination needs.    Review of patient status, including review of consultants reports, relevant laboratory and other test results, and collaboration with appropriate care team members and the patient's provider was performed as part of comprehensive patient evaluation and provision of chronic care management services.    Outpatient Encounter Medications as of 02/24/2022  Medication Sig   rosuvastatin (CRESTOR) 10 MG tablet Take 1 tablet (10 mg total) by mouth every other day.   aspirin EC 81 MG tablet Take 81 mg by mouth daily. Swallow whole.   Cinnamon 500 MG capsule SMARTSIG:1 Capsule(s) By Mouth   Coenzyme Q10 (COQ-10) 100 MG CAPS SMARTSIG:1 Capsule(s) By Mouth   dapagliflozin propanediol (FARXIGA) 10 MG TABS tablet Take 1 tablet (10 mg total) by mouth daily before breakfast.   fluorouracil (EFUDEX) 5 % cream Apply topically as needed.   fluticasone (FLONASE) 50 MCG/ACT nasal spray Place 2 sprays into both nostrils daily. (Patient not taking: Reported on 11/05/2021)   metoprolol succinate (TOPROL XL) 25 MG 24 hr tablet Take 1 tablet (25 mg total) by mouth daily.   mupirocin ointment (BACTROBAN) 2 % Apply 1 application. topically as needed.   N-ACETYL CYSTEINE 600 MG TABS SMARTSIG:1 Tablet(s) By Mouth   RA CRANBERRY 500 MG CAPS SMARTSIG:1 Capsule(s) By Mouth   sacubitril-valsartan (ENTRESTO) 24-26 MG Take 1 tablet by mouth 2 (two) times daily.   sildenafil (VIAGRA) 100 MG tablet Take 1 tablet by mouth as needed.   spironolactone (ALDACTONE) 25 MG tablet Take 0.5 tablets (12.5 mg total) by mouth daily.   tacrolimus  (PROTOPIC) 0.1 % ointment Apply 1 application. topically as needed.   [DISCONTINUED] rosuvastatin (CRESTOR) 10 MG tablet TAKE 1 TABLET BY MOUTH EVERY DAY   No facility-administered encounter medications on file as of 02/24/2022.     Objective:   Medication  Adherence follow up for Rosuvastatin 10 mg tablet.   Spoke with [name] today in regards to how he is taking his Rosuvastatin 10 mg tablet, confirmed he is taking one tablet every other day, and not one tablet daily. Will collaborate with the PCP team to change the directions to reflect how he is taking it. PCP confirmed approval. [facility] for the patient, and the older prescription was inactivated. Confirmed with patient that his medication would be ready for pick up, he voiced understanding and is going to pick it up today.   Plan:   The patient has been provided with contact information for the care management team and has been advised to call with any health related questions or concerns.     Edward [name], CPP, PharmD Clinical Pharmacist Practitioner [facility] [phone]

## 2022-02-25 ENCOUNTER — Telehealth: Payer: Self-pay

## 2022-02-25 NOTE — Chronic Care Management (AMB) (Signed)
02-25-2022: Patient was informed that patient assistance applications are ready to sign at [facility] and to bring income verification for himself and wife.  [place] Pharmacist Assistant [phone]

## 2022-03-04 ENCOUNTER — Telehealth: Payer: Self-pay

## 2022-03-04 NOTE — Chronic Care Management (AMB) (Addendum)
    Chronic Care Management Pharmacy Assistant   Name: Edward [name]  MRN: [id number] DOB: [birth year]  Reason for Encounter: Disease State/ Diabetes  Recent office visits:  None  Recent consult visits:  01-12-2022 Ardis Hughs, MD (Urology). Unable to view encounter.   Hospital visits:  None in previous 6 months  Medications: Outpatient Encounter Medications as of 03/04/2022  Medication Sig   aspirin EC 81 MG tablet Take 81 mg by mouth daily. Swallow whole.   Cinnamon 500 MG capsule SMARTSIG:1 Capsule(s) By Mouth   Coenzyme Q10 (COQ-10) 100 MG CAPS SMARTSIG:1 Capsule(s) By Mouth   dapagliflozin propanediol (FARXIGA) 10 MG TABS tablet Take 1 tablet (10 mg total) by mouth daily before breakfast.   fluorouracil (EFUDEX) 5 % cream Apply topically as needed.   fluticasone (FLONASE) 50 MCG/ACT nasal spray Place 2 sprays into both nostrils daily. (Patient not taking: Reported on 11/05/2021)   metoprolol succinate (TOPROL XL) 25 MG 24 hr tablet Take 1 tablet (25 mg total) by mouth daily.   mupirocin ointment (BACTROBAN) 2 % Apply 1 application. topically as needed.   N-ACETYL CYSTEINE 600 MG TABS SMARTSIG:1 Tablet(s) By Mouth   RA CRANBERRY 500 MG CAPS SMARTSIG:1 Capsule(s) By Mouth   rosuvastatin (CRESTOR) 10 MG tablet Take 1 tablet (10 mg total) by mouth every other day.   sacubitril-valsartan (ENTRESTO) 24-26 MG Take 1 tablet by mouth 2 (two) times daily.   sildenafil (VIAGRA) 100 MG tablet Take 1 tablet by mouth as needed.   spironolactone (ALDACTONE) 25 MG tablet Take 0.5 tablets (12.5 mg total) by mouth daily.   tacrolimus (PROTOPIC) 0.1 % ointment Apply 1 application. topically as needed.   No facility-administered encounter medications on file as of 03/04/2022.  Recent Relevant Labs: Lab Results  Component Value Date/Time   MICROALBUR 10 07/14/2020 03:58 PM    Kidney Function Lab Results  Component Value Date/Time   CREATININE 1.25 12/22/2021 03:35 PM   CREATININE 1.28 (H)  09/02/2021 11:05 AM   GFRNONAA 65 09/04/2020 04:48 PM   GFRAA 76 09/04/2020 04:48 PM    03-04-2022: 1st attempt left VM 03-09-2022: 2nd attempt left VM 03-10-2022: 3rd attempt left VM  03-11-2022: Was told to contact patient to request updated income for entresto application and patient stated he checked a box on application for patient assistance company to pull income verification. Reviewed [employer] application and it requests first 2 pages of federal taxes. Contacted patient again and told him him and his wife's 2022 taxes are needed and the company doesn't pull income verification. Patient will bring documents but not sure when. Patient was also suppose to sign application for farxiga but patient stated he decided not to take medication. Updated [name] and farxiga application is discarded.Patient stated he doesn't check blood sugar at home.  Care Gaps: Tdap overdue AWV 06-23-2022  Star Rating Drugs: Farxiga 10 mg- [facility]22 and has refills. Refill is being processed. Patient stated he isn't taking medication) Rosuvastatin 10 mg- Last filled [facility]-24-2022 [facility] Entresto 24-26 mg- Last filled [facility]14-[facility]21 [facility]  [place] Clinical Pharmacist Assistant [phone] -

## 2022-03-17 ENCOUNTER — Telehealth: Payer: Self-pay

## 2022-04-02 ENCOUNTER — Telehealth: Payer: Self-pay | Admitting: Cardiology

## 2022-04-02 NOTE — Telephone Encounter (Signed)
Spoke with the patient who states that he is currently out of Entresto and is waiting for his patient assistance to be renewed. He states he has previously picked up samples from his PCP but they are closed today so wanted to see if we had any. Advised that we do not currently have any samples.

## 2022-04-02 NOTE — Telephone Encounter (Signed)
Patient calling the office for samples of medication:   1.  What medication and dosage are you requesting samples for? sacubitril-valsartan (ENTRESTO) 24-26 MG  2.  Are you currently out of this medication? Yes; patient is asking for samples

## 2022-04-05 ENCOUNTER — Telehealth: Payer: Self-pay

## 2022-04-05 NOTE — Chronic Care Management (AMB) (Signed)
    Chronic Care Management Pharmacy Assistant   Name: Edward [name]  MRN: [id number] DOB: 1951/05/16   Reason for Encounter: [name] PAP  04-05-2022: Patient called to follow up on entresto patient assistance. Contacted [employer] and was told patient has been approved since 06-2021 until [id number] with application from cardiology. Patient is to call [employer] to set up delivery and phone number provided. Explained to patient that since application was approved with cardiology he will need to contact cardiology from here on out and was reminded that re enrollment for 2024 may start this year in November. Patient understands to stay consistent with calling in deliveries and staying enrolled.   Medications: Outpatient Encounter Medications as of 04/05/2022  Medication Sig   aspirin EC 81 MG tablet Take 81 mg by mouth daily. Swallow whole.   Cinnamon 500 MG capsule SMARTSIG:1 Capsule(s) By Mouth   Coenzyme Q10 (COQ-10) 100 MG CAPS SMARTSIG:1 Capsule(s) By Mouth   dapagliflozin propanediol (FARXIGA) 10 MG TABS tablet Take 1 tablet (10 mg total) by mouth daily before breakfast.   fluorouracil (EFUDEX) 5 % cream Apply topically as needed.   fluticasone (FLONASE) 50 MCG/ACT nasal spray Place 2 sprays into both nostrils daily. (Patient not taking: Reported on 11/05/2021)   metoprolol succinate (TOPROL XL) 25 MG 24 hr tablet Take 1 tablet (25 mg total) by mouth daily.   mupirocin ointment (BACTROBAN) 2 % Apply 1 application. topically as needed.   N-ACETYL CYSTEINE 600 MG TABS SMARTSIG:1 Tablet(s) By Mouth   RA CRANBERRY 500 MG CAPS SMARTSIG:1 Capsule(s) By Mouth   rosuvastatin (CRESTOR) 10 MG tablet Take 1 tablet (10 mg total) by mouth every other day.   sacubitril-valsartan (ENTRESTO) 24-26 MG Take 1 tablet by mouth 2 (two) times daily.   sildenafil (VIAGRA) 100 MG tablet Take 1 tablet by mouth as needed.   spironolactone (ALDACTONE) 25 MG tablet Take 0.5 tablets (12.5 mg total) by mouth daily.    tacrolimus (PROTOPIC) 0.1 % ointment Apply 1 application. topically as needed.   No facility-administered encounter medications on file as of 04/05/2022.    [place] Pharmacist Assistant [phone]

## 2022-04-05 NOTE — Chronic Care Management (AMB) (Signed)
Faxed Entresto pap with tax forms patient dropped off to [employer] patient assistance program.   [name], [place] Pharmacist Assistant [phone]

## 2022-04-12 ENCOUNTER — Encounter: Payer: Self-pay | Admitting: Nurse Practitioner

## 2022-04-15 ENCOUNTER — Ambulatory Visit (INDEPENDENT_AMBULATORY_CARE_PROVIDER_SITE_OTHER): Payer: Medicare HMO | Admitting: Nurse Practitioner

## 2022-04-15 ENCOUNTER — Encounter: Payer: Self-pay | Admitting: Nurse Practitioner

## 2022-04-15 VITALS — BP 120/70 | HR 79 | Temp 98.4°F | Ht 72.0 in | Wt 206.0 lb

## 2022-04-15 DIAGNOSIS — M79672 Pain in left foot: Secondary | ICD-10-CM

## 2022-04-15 DIAGNOSIS — E782 Mixed hyperlipidemia: Secondary | ICD-10-CM

## 2022-04-15 DIAGNOSIS — I119 Hypertensive heart disease without heart failure: Secondary | ICD-10-CM | POA: Diagnosis not present

## 2022-04-15 DIAGNOSIS — I429 Cardiomyopathy, unspecified: Secondary | ICD-10-CM

## 2022-04-15 NOTE — Progress Notes (Signed)
I,Edward [name],acting as a Education administrator for [employer], FNP.,have documented all relevant documentation on the behalf of Edward Brine, FNP,as directed by  Edward Brine, FNP while in the presence of Edward [name], [place].  Subjective:     Patient ID: Edward [name] , male    DOB: 1950-11-04 , 71 y.o.   MRN: [id number]   Chief Complaint  Patient presents with   Hypertension    HPI  Patient presents today for HTN follow up. Patient states the heel on his left foot is hurting, he states it used to feel like bruise and then the pain spread to the edge of his heel. Patient states he has had to change his walk due to the pain in his heel.  He is putting the ball of his foot down first vs on the heel. Has not taken any tylenol. If he missteps and put pressure it will hurt more.   Reports he has stopped taking Farxiga due to not feeling like he got a clear understanding of why he was on it with Cardiology.  BP Readings from Last 3 Encounters: 04/15/22 : 120/70 12/22/21 : 110/78 11/05/21 : 100/72    Hypertension This is a chronic problem. The current episode started more than 1 year ago. The problem is unchanged. The problem is controlled. Pertinent negatives include no anxiety. Risk factors for coronary artery disease include sedentary lifestyle. There are no compliance problems.  There is no history of chronic renal disease.     Past Medical History:  Diagnosis Date   Abnormal EKG 05/29/2018   Sinus bradycardia and LAFB with TWI V4-V6, III and aVF   Benign essential HTN 05/29/2018   Cardiomyopathy ([place])    a. dx by echo 07/2020.   Dilation of aorta (HCC)    Environmental allergies    Fracture, ribs    H/O: varicose veins    Hearing loss    Hyperlipidemia LDL goal <70    Multiple food allergies    Rash    Seasonal allergies    Sinusitis    Stroke [facility])    s/p tpa     Family History  Problem Relation Age of Onset   CVA Mother    Heart failure Father        CHF   Brain cancer Brother         GBM   Colon polyps Neg Hx    Colon cancer Neg Hx    Esophageal cancer Neg Hx    Rectal cancer Neg Hx    Stomach cancer Neg Hx      Current Outpatient Medications:    aspirin EC 81 MG tablet, Take 81 mg by mouth daily. Swallow whole., Disp: , Rfl:    Cinnamon 500 MG capsule, SMARTSIG:1 Capsule(s) By Mouth, Disp: , Rfl:    Coenzyme Q10 (COQ-10) 100 MG CAPS, SMARTSIG:1 Capsule(s) By Mouth, Disp: , Rfl:    fluorouracil (EFUDEX) 5 % cream, Apply topically as needed., Disp: , Rfl:    metoprolol succinate (TOPROL XL) 25 MG 24 hr tablet, Take 1 tablet (25 mg total) by mouth daily., Disp: 90 tablet, Rfl: 3   mupirocin ointment (BACTROBAN) 2 %, Apply 1 application. topically as needed., Disp: , Rfl:    N-ACETYL CYSTEINE 600 MG TABS, SMARTSIG:1 Tablet(s) By Mouth, Disp: , Rfl:    RA CRANBERRY 500 MG CAPS, SMARTSIG:1 Capsule(s) By Mouth, Disp: , Rfl:    rosuvastatin (CRESTOR) 10 MG tablet, Take 1 tablet (10 mg total) by mouth every other  day., Disp: 45 tablet, Rfl: 1   sacubitril-valsartan (ENTRESTO) 24-26 MG, Take 1 tablet by mouth 2 (two) times daily., Disp: 180 tablet, Rfl: 3   sildenafil (VIAGRA) 100 MG tablet, Take 1 tablet by mouth as needed., Disp: , Rfl:    spironolactone (ALDACTONE) 25 MG tablet, Take 0.5 tablets (12.5 mg total) by mouth daily., Disp: 45 tablet, Rfl: 3   tacrolimus (PROTOPIC) 0.1 % ointment, Apply 1 application. topically as needed., Disp: , Rfl:    No Known Allergies   Review of Systems  Constitutional: Negative.   Respiratory: Negative.    Cardiovascular: Negative.   Gastrointestinal: Negative.   Musculoskeletal:        Left foot pain to heel.   Neurological: Negative.   Psychiatric/Behavioral: Negative.       Today's Vitals   04/15/22 1519  BP: 120/70  Pulse: 79  Temp: 98.4 F (36.9 C)  TempSrc: Oral  Weight: 206 lb (93.4 kg)  Height: 6' (1.829 m)  PainSc: 5   PainLoc: Foot   Body mass index is 27.94 kg/m.  Wt Readings from Last 3 Encounters:   04/15/22 206 lb (93.4 kg)  12/22/21 205 lb 6.4 oz (93.2 kg)  11/05/21 208 lb 9.6 oz (94.6 kg)    Objective:  Physical Exam Vitals reviewed.  Constitutional:      General: He is not in acute distress.    Appearance: Normal appearance.  Cardiovascular:     Rate and Rhythm: Normal rate and regular rhythm.     Pulses: Normal pulses.     Heart sounds: Normal heart sounds. No murmur heard. Pulmonary:     Effort: Pulmonary effort is normal. No respiratory distress.     Breath sounds: Normal breath sounds. No wheezing.  Neurological:     General: No focal deficit present.     Mental Status: He is alert and oriented to person, place, and time.     Cranial Nerves: No cranial nerve deficit.     Motor: No weakness.  Psychiatric:        Mood and Affect: Mood normal.        Behavior: Behavior normal.        Thought Content: Thought content normal.        Judgment: Judgment normal.         Assessment And Plan:     1. Mixed hyperlipidemia Comments: Cholesterol levels are slightly up at last visit, continue current medications.   2. Hypertensive heart disease without heart failure Comments: Blood pressure is well controlled. Continue current medications  3. Cardiomyopathy, unspecified type [facility]) Comments: He has stopped taking [place] on his own, explained the purpose of [place] but still resistant to take the medication. Advised to f/u with Cardiology  4. Left foot pain Comments: Unable to illicit any pain with palpation, he would like to see Podiatry to evaluate further. He does have a slight limp when walking. - Ambulatory referral to Podiatry     Patient was given opportunity to ask questions. Patient verbalized understanding of the plan and was able to repeat key elements of the plan. All questions were answered to their satisfaction.  Edward Brine, FNP   I, Edward Brine, FNP, have reviewed all documentation for this visit. The documentation on 04/15/22 for the exam,  diagnosis, procedures, and orders are all accurate and complete.   IF YOU HAVE BEEN REFERRED TO A SPECIALIST, IT MAY TAKE 1-2 WEEKS TO SCHEDULE/PROCESS THE REFERRAL. IF YOU HAVE NOT HEARD FROM US/SPECIALIST IN  TWO WEEKS, PLEASE GIVE [place] A CALL AT [phone] X [phone].   THE PATIENT IS ENCOURAGED TO PRACTICE SOCIAL DISTANCING DUE TO THE COVID-19 PANDEMIC.

## 2022-04-15 NOTE — Patient Instructions (Signed)
Hypertension, Adult High blood pressure (hypertension) is when the force of blood pumping through the arteries is too strong. The arteries are the blood vessels that carry blood from the heart throughout the body. Hypertension forces the heart to work harder to pump blood and may cause arteries to become narrow or stiff. Untreated or uncontrolled hypertension can lead to a heart attack, heart failure, a stroke, kidney disease, and other problems. A blood pressure reading consists of a higher number over a lower number. Ideally, your blood pressure should be below 120/80. The first ("top") number is called the systolic pressure. It is a measure of the pressure in your arteries as your heart beats. The second ("bottom") number is called the diastolic pressure. It is a measure of the pressure in your arteries as the heart relaxes. What are the causes? The exact cause of this condition is not known. There are some conditions that result in high blood pressure. What increases the risk? Certain factors may make you more likely to develop high blood pressure. Some of these risk factors are under your control, including: Smoking. Not getting enough exercise or physical activity. Being overweight. Having too much fat, sugar, calories, or salt (sodium) in your diet. Drinking too much alcohol. Other risk factors include: Having a personal history of heart disease, diabetes, high cholesterol, or kidney disease. Stress. Having a family history of high blood pressure and high cholesterol. Having obstructive sleep apnea. Age. The risk increases with age. What are the signs or symptoms? High blood pressure may not cause symptoms. Very high blood pressure (hypertensive crisis) may cause: Headache. Fast or irregular heartbeats (palpitations). Shortness of breath. Nosebleed. Nausea and vomiting. Vision changes. Severe chest pain, dizziness, and seizures. How is this diagnosed? This condition is diagnosed by  measuring your blood pressure while you are seated, with your arm resting on a flat surface, your legs uncrossed, and your feet flat on the floor. The cuff of the blood pressure monitor will be placed directly against the skin of your upper arm at the level of your heart. Blood pressure should be measured at least twice using the same arm. Certain conditions can cause a difference in blood pressure between your right and left arms. If you have a high blood pressure reading during one visit or you have normal blood pressure with other risk factors, you may be asked to: Return on a different day to have your blood pressure checked again. Monitor your blood pressure at home for 1 week or longer. If you are diagnosed with hypertension, you may have other blood or imaging tests to help your health care provider understand your overall risk for other conditions. How is this treated? This condition is treated by making healthy lifestyle changes, such as eating healthy foods, exercising more, and reducing your alcohol intake. You may be referred for counseling on a healthy diet and physical activity. Your health care provider may prescribe medicine if lifestyle changes are not enough to get your blood pressure under control and if: Your systolic blood pressure is above 130. Your diastolic blood pressure is above 80. Your personal target blood pressure may vary depending on your medical conditions, your age, and other factors. Follow these instructions at home: Eating and drinking  Eat a diet that is high in fiber and potassium, and low in sodium, added sugar, and fat. An example of this eating plan is called the DASH diet. DASH stands for Dietary Approaches to Stop Hypertension. To eat this way: Eat   plenty of fresh fruits and vegetables. Try to fill one half of your plate at each meal with fruits and vegetables. Eat whole grains, such as whole-wheat pasta, brown rice, or whole-grain bread. Fill about one  fourth of your plate with whole grains. Eat or drink low-fat dairy products, such as skim milk or low-fat yogurt. Avoid fatty cuts of meat, processed or cured meats, and poultry with skin. Fill about one fourth of your plate with lean proteins, such as fish, chicken without skin, beans, eggs, or tofu. Avoid pre-made and processed foods. These tend to be higher in sodium, added sugar, and fat. Reduce your daily sodium intake. Many people with hypertension should eat less than 1,500 mg of sodium a day. Do not drink alcohol if: Your health care provider tells you not to drink. You are pregnant, may be pregnant, or are planning to become pregnant. If you drink alcohol: Limit how much you have to: 0-1 drink a day for women. 0-2 drinks a day for men. Know how much alcohol is in your drink. In the [place] one drink equals one 12 oz bottle of beer (355 mL), one 5 oz glass of wine (148 mL), or one 1 oz glass of hard liquor (44 mL). Lifestyle  Work with your health care provider to maintain a healthy body weight or to lose weight. Ask what an ideal weight is for you. Get at least 30 minutes of exercise that causes your heart to beat faster (aerobic exercise) most days of the week. Activities may include walking, swimming, or biking. Include exercise to strengthen your muscles (resistance exercise), such as Pilates or lifting weights, as part of your weekly exercise routine. Try to do these types of exercises for 30 minutes at least 3 days a week. Do not use any products that contain nicotine or tobacco. These products include cigarettes, chewing tobacco, and vaping devices, such as e-cigarettes. If you need help quitting, ask your health care provider. Monitor your blood pressure at home as told by your health care provider. Keep all follow-up visits. This is important. Medicines Take over-the-counter and prescription medicines only as told by your health care provider. Follow directions carefully. Blood  pressure medicines must be taken as prescribed. Do not skip doses of blood pressure medicine. Doing this puts you at risk for problems and can make the medicine less effective. Ask your health care provider about side effects or reactions to medicines that you should watch for. Contact a health care provider if you: Think you are having a reaction to a medicine you are taking. Have headaches that keep coming back (recurring). Feel dizzy. Have swelling in your ankles. Have trouble with your vision. Get help right away if you: Develop a severe headache or confusion. Have unusual weakness or numbness. Feel faint. Have severe pain in your chest or abdomen. Vomit repeatedly. Have trouble breathing. These symptoms may be an emergency. Get help right away. Call 911. Do not wait to see if the symptoms will go away. Do not drive yourself to the hospital. Summary Hypertension is when the force of blood pumping through your arteries is too strong. If this condition is not controlled, it may put you at risk for serious complications. Your personal target blood pressure may vary depending on your medical conditions, your age, and other factors. For most people, a normal blood pressure is less than 120/80. Hypertension is treated with lifestyle changes, medicines, or a combination of both. Lifestyle changes include losing weight, eating a healthy,   low-sodium diet, exercising more, and limiting alcohol. This information is not intended to replace advice given to you by your health care provider. Make sure you discuss any questions you have with your health care provider. Document Revised: 06/23/2021 Document Reviewed: 06/23/2021 Elsevier Patient Education  2023 Elsevier Inc.  

## 2022-04-21 ENCOUNTER — Ambulatory Visit (INDEPENDENT_AMBULATORY_CARE_PROVIDER_SITE_OTHER): Payer: Medicare HMO

## 2022-04-21 ENCOUNTER — Ambulatory Visit: Payer: Medicare HMO | Admitting: Podiatry

## 2022-04-21 ENCOUNTER — Encounter: Payer: Self-pay | Admitting: Podiatry

## 2022-04-21 DIAGNOSIS — M7752 Other enthesopathy of left foot: Secondary | ICD-10-CM | POA: Diagnosis not present

## 2022-04-21 DIAGNOSIS — M7750 Other enthesopathy of unspecified foot: Secondary | ICD-10-CM

## 2022-04-21 DIAGNOSIS — M722 Plantar fascial fibromatosis: Secondary | ICD-10-CM

## 2022-04-21 MED ORDER — TRIAMCINOLONE ACETONIDE 10 MG/ML IJ SUSP
10.0000 mg | Freq: Once | INTRAMUSCULAR | Status: AC
  Administered 2022-04-21: 10 mg

## 2022-04-21 NOTE — Patient Instructions (Signed)

## 2022-04-22 NOTE — Progress Notes (Signed)
Subjective:   Patient ID: Edward [name], male   DOB: 71 y.o.   MRN: [id number]   HPI Patient states he is developed severe heel pain recently and over the last 3 weeks its been worse.  States it is worse when he gets up in the morning after periods of sitting and patient likes to be active does not smoke   Review of Systems  All other systems reviewed and are negative.       Objective:  Physical Exam Vitals and nursing note reviewed.  Constitutional:      Appearance: He is well-developed.  Pulmonary:     Effort: Pulmonary effort is normal.  Musculoskeletal:        General: Normal range of motion.  Skin:    General: Skin is warm.  Neurological:     Mental Status: He is alert.     Neurovascular status found to be intact muscle strength was found to be adequate range of motion adequate.  Patient is noted to have exquisite discomfort plantar aspect left heel at the insertional point of the tendon into the calcaneus with inflammation fluid of the medial band.  Patient has moderate depression of the arch good digital perfusion well oriented x3     Assessment:  Acute plantar fasciitis left with inflammation fluid with moderate depression of the arch is complicating factor     Plan:  H&P x-rays reviewed and went ahead today did sterile prep and injected the fascia at insertion 3 mg Kenalog 5 mg Xylocaine applied fascial brace to lift up the arch and take stress off the insertional point of the fascia gave instructions for shoe gear modifications and the utilization of stretching exercises and patient will be seen back in several weeks  X-rays were negative for signs of fracture at this time appears to be more of a soft tissue inflammatory injury with moderate depression of the arch

## 2022-04-26 ENCOUNTER — Ambulatory Visit: Payer: Medicare HMO | Admitting: Podiatry

## 2022-04-27 ENCOUNTER — Ambulatory Visit: Payer: Medicare HMO | Admitting: Nurse Practitioner

## 2022-06-01 DIAGNOSIS — R3914 Feeling of incomplete bladder emptying: Secondary | ICD-10-CM | POA: Diagnosis not present

## 2022-06-01 DIAGNOSIS — R8279 Other abnormal findings on microbiological examination of urine: Secondary | ICD-10-CM | POA: Diagnosis not present

## 2022-06-04 ENCOUNTER — Telehealth: Payer: Self-pay

## 2022-06-04 NOTE — Chronic Care Management (AMB) (Signed)
06-04-2022: Rescheduled 06-08-2022 appointment with [name] to November do to provider being out of office.  [place] Pharmacist Assistant [phone]

## 2022-06-08 ENCOUNTER — Telehealth: Payer: Medicare HMO

## 2022-06-23 ENCOUNTER — Ambulatory Visit: Payer: Medicare HMO

## 2022-06-29 ENCOUNTER — Telehealth: Payer: Self-pay | Admitting: Nurse Practitioner

## 2022-06-29 ENCOUNTER — Ambulatory Visit (INDEPENDENT_AMBULATORY_CARE_PROVIDER_SITE_OTHER): Payer: Medicare HMO | Admitting: Student

## 2022-06-29 ENCOUNTER — Encounter: Payer: Self-pay | Admitting: Student

## 2022-06-29 VITALS — BP 105/77 | HR 77 | Temp 98.0°F | Ht 72.0 in | Wt 205.5 lb

## 2022-06-29 DIAGNOSIS — Z7689 Persons encountering health services in other specified circumstances: Secondary | ICD-10-CM

## 2022-06-29 DIAGNOSIS — I10 Essential (primary) hypertension: Secondary | ICD-10-CM

## 2022-06-29 DIAGNOSIS — Z87891 Personal history of nicotine dependence: Secondary | ICD-10-CM | POA: Diagnosis not present

## 2022-06-29 DIAGNOSIS — I5022 Chronic systolic (congestive) heart failure: Secondary | ICD-10-CM | POA: Diagnosis not present

## 2022-06-29 DIAGNOSIS — E782 Mixed hyperlipidemia: Secondary | ICD-10-CM

## 2022-06-29 DIAGNOSIS — I110 Hypertensive heart disease with heart failure: Secondary | ICD-10-CM | POA: Diagnosis not present

## 2022-06-29 DIAGNOSIS — I119 Hypertensive heart disease without heart failure: Secondary | ICD-10-CM

## 2022-06-29 NOTE — Patient Instructions (Signed)
[name], it was a pleasure seeing you today!  Today we discussed: - Welcome to clinic! We are happy to have you here. We will get some lab work today to check on your electrolytes and kidneys. Please let us know whenever you need re-fills or need to be seen sooner.   Follow-up: 6 months   Please make sure to arrive 15 minutes prior to your next appointment. If you arrive late, you may be asked to reschedule.   We look forward to seeing you next time. Please call our clinic at [phone] if you have any questions or concerns. The best time to call is Monday-Friday from 9am-4pm, but there is someone available 24/7. If after hours or the weekend, call the main hospital number and ask for the Internal Medicine Resident On-Call. If you need medication refills, please notify your pharmacy one week in advance and they will send [place] a request.  Thank you for letting us take part in your care. Wishing you the best!  Thank you, Sanjuan Dame, MD

## 2022-06-29 NOTE — Telephone Encounter (Signed)
I spoke with patient to schedule AWV.  He stated he has switched Providers.  He goes to [facility] on [address]

## 2022-06-30 LAB — BMP8+ANION GAP
Anion Gap: 19 mmol/L — ABNORMAL HIGH (ref 10.0–18.0)
BUN/Creatinine Ratio: 14 (ref 10–24)
BUN: 16 mg/dL (ref 8–27)
CO2: 22 mmol/L (ref 20–29)
Calcium: 9.2 mg/dL (ref 8.6–10.2)
Chloride: 99 mmol/L (ref 96–106)
Creatinine, Ser: 1.14 mg/dL (ref 0.76–1.27)
Glucose: 89 mg/dL (ref 70–99)
Potassium: 4.6 mmol/L (ref 3.5–5.2)
Sodium: 140 mmol/L (ref 134–144)
eGFR: 70 mL/min/{1.73_m2} (ref >59)

## 2022-07-02 DIAGNOSIS — I5022 Chronic systolic (congestive) heart failure: Secondary | ICD-10-CM | POA: Insufficient documentation

## 2022-07-02 DIAGNOSIS — I428 Other cardiomyopathies: Secondary | ICD-10-CM | POA: Insufficient documentation

## 2022-07-02 DIAGNOSIS — Z7689 Persons encountering health services in other specified circumstances: Secondary | ICD-10-CM | POA: Insufficient documentation

## 2022-07-02 NOTE — Progress Notes (Signed)
   CC: establish care  HPI:  EdwardEdward [name] is a 71 y.o. person with medical history as below presenting to [facility] to establish care. Patient reports past medical history of systolic heart failure, hypertension, hyperlipidemia. Previous hernia surgery in the past. Reports he completes his ADL's and IADL's independently. Previous tobacco use when he was young. Occasional alcohol use. No recreational drug use.  Please see problem-based list for further details, assessments, and plans.  Past Medical History:  Diagnosis Date   Abnormal EKG 05/29/2018   Sinus bradycardia and LAFB with TWI V4-V6, III and aVF   Benign essential HTN 05/29/2018   Cardiomyopathy ([place])    a. dx by echo 07/2020.   Dilation of aorta (HCC)    Environmental allergies    Fracture, ribs    H/O: varicose veins    Hearing loss    Hyperlipidemia LDL goal <70    Multiple food allergies    Rash    Seasonal allergies    Sinusitis    Stroke [facility])    s/p tpa   Review of Systems:  As per HPI  Physical Exam:  Vitals:   06/29/22 1608  BP: 105/77  Pulse: 77  Temp: 98 F (36.7 C)  TempSrc: Oral  SpO2: 98%  Weight: 205 lb 8 oz (93.2 kg)  Height: 6' (1.829 m)   General: Resting comfortably in no acute distress CV: Regular rate, rhythm. No murmurs appreciated. Warm extremities. Pulm: Normal work of breathing on room air. Clear to auscultation bilaterally. GI: Abdomen soft, non-tender, non-distended. Normoactive bowel sounds. MSK: Normal bulk, tone. No peripheral edema noted. Neuro: Awake, alert, conversing appropriately. Psych: Normal mood, affect, speech.  Assessment & Plan:   Benign essential HTN BP Readings from Last 3 Encounters:  06/29/22 105/77  04/15/22 120/70  12/22/21 110/78   Blood pressure at goal today. Denies any lightheadedness, dizziness, syncopal episodes. Continue with current regimen.  [name] 24-'26mg'$  twice daily - Metoprolol succinate '25mg'$  daily - Spironolactone 12.'5mg'$  daily - BMP  today  Chronic systolic heart failure ([place]) Last Echo 11/2021 with EF 35-40%. Coronary CTA w/ calcium score 0. is seen by Antonieta Pert, last appointment earlier this year in April.  Today Edward [name] reports he is doing well, denies any orthopnea, dyspnea, cough, chest pain. States that his weight has been stable, dry weight appears to be ~205lb. Reports compliance with medications, does not have concerns today.  Patient appears to be euvolemic on exam. Blood pressure at goal. He is currently on goal-direct medical therapy, will continue with his current regimen.  [name] 24-'26mg'$  twice daily - Metoprolol succinate '25mg'$  daily - Spironolactone 12.'5mg'$  daily - BMP today  Mixed hyperlipidemia Last LDL elevated at 118 in April 2023. Currently compliant with rosuvastatin, will re-check these levels next year.  - Continue rosuvastatin '10mg'$  daily - Lipid panel next year  Patient discussed with Dr.  Maurilio Lovely, MD Internal Medicine PGY-3 Pager: [phone]

## 2022-07-02 NOTE — Assessment & Plan Note (Signed)
Last Echo 11/2021 with EF 35-40%. Coronary CTA w/ calcium score 0. is seen by Edward [name], last appointment earlier this year in April.  Today [name] reports he is doing well, denies any orthopnea, dyspnea, cough, chest pain. States that his weight has been stable, dry weight appears to be ~205lb. Reports compliance with medications, does not have concerns today.  Patient appears to be euvolemic on exam. Blood pressure at goal. He is currently on goal-direct medical therapy, will continue with his current regimen.  Edward [name] 24-'26mg'$  twice daily - Metoprolol succinate '25mg'$  daily - Spironolactone 12.'5mg'$  daily - BMP today

## 2022-07-02 NOTE — Assessment & Plan Note (Signed)
Last LDL elevated at 118 in April 2023. Currently compliant with rosuvastatin, will re-check these levels next year.  - Continue rosuvastatin '10mg'$  daily - Lipid panel next year

## 2022-07-02 NOTE — Assessment & Plan Note (Signed)
BP Readings from Last 3 Encounters:  06/29/22 105/77  04/15/22 120/70  12/22/21 110/78   Blood pressure at goal today. Denies any lightheadedness, dizziness, syncopal episodes. Continue with current regimen.  [name] 24-'26mg'$  twice daily - Metoprolol succinate '25mg'$  daily - Spironolactone 12.'5mg'$  daily - BMP today

## 2022-07-06 NOTE — Progress Notes (Signed)
Internal Medicine Clinic Attending ? ?Case discussed with Dr. Braswell  At the time of the visit.  We reviewed the resident?s history and exam and pertinent patient test results.  I agree with the assessment, diagnosis, and plan of care documented in the resident?s note.  ?

## 2022-07-14 ENCOUNTER — Telehealth: Payer: Medicare HMO

## 2022-08-02 ENCOUNTER — Telehealth: Payer: Self-pay | Admitting: *Deleted

## 2022-08-02 NOTE — Telephone Encounter (Signed)
**Note De-Identified Edward [name] Obfuscation** The pt states that he applied for Entresto/NPAF assistance through a pharmacist at his PCPs office and that it was his understanding that he was approved. He states that he called [employer] this morning and was advised that they have no application on file for him.  He wants to apply through our office as Dr Radford Pax prescribed this medication.  He states that he is going to print an application off line, complete it, obtain required documents per [employer], and that he will bring all to drop off in the front office.  He is aware that we will take care of the providers page of his application and will fax all to [employer].  He thanked me for calling him back to discuss.

## 2022-08-02 NOTE — Telephone Encounter (Signed)
Per pt called few  weeks ago  checking on status and recording stated would received in 2 weeks Per pt still  has not received and has been out of med for 5-6 weeks Pt was told had a year approval on med Will forward to [facility] Via LPN

## 2022-08-02 NOTE — Telephone Encounter (Signed)
Patient called the office and stated  "He is a patient of Dr. Radford Pax, he is out of Entresto 24-26 mg and has been out for  5-6 weeks". He also stated that "he called [employer] and they told him there is no order". Please advise. He can be reached at [phone]. Thank you.

## 2022-08-04 ENCOUNTER — Encounter (HOSPITAL_BASED_OUTPATIENT_CLINIC_OR_DEPARTMENT_OTHER): Payer: Self-pay | Admitting: Cardiology

## 2022-08-17 ENCOUNTER — Ambulatory Visit: Payer: Medicare HMO | Admitting: Nurse Practitioner

## 2022-08-26 ENCOUNTER — Other Ambulatory Visit: Payer: Self-pay | Admitting: Nurse Practitioner

## 2022-09-09 ENCOUNTER — Other Ambulatory Visit: Payer: Self-pay

## 2022-09-09 ENCOUNTER — Ambulatory Visit (INDEPENDENT_AMBULATORY_CARE_PROVIDER_SITE_OTHER): Payer: Medicare HMO

## 2022-09-09 VITALS — BP 119/85 | HR 96 | Temp 98.1°F | Ht 72.0 in | Wt 205.4 lb

## 2022-09-09 DIAGNOSIS — R0981 Nasal congestion: Secondary | ICD-10-CM | POA: Diagnosis not present

## 2022-09-09 DIAGNOSIS — J329 Chronic sinusitis, unspecified: Secondary | ICD-10-CM | POA: Insufficient documentation

## 2022-09-09 MED ORDER — AMOXICILLIN-POT CLAVULANATE 875-125 MG PO TABS: 1.0000 | ORAL_TABLET | Freq: Two times a day (BID) | ORAL | 0 refills | Status: DC

## 2022-09-09 NOTE — Patient Instructions (Signed)
Edward [name], it was a pleasure seeing you today! You endorsed feeling well today. Below are some of the things we talked about this visit. We look forward to seeing you in the follow up appointment!  Today we discussed: Please take augmentin twice a day for 5 days. Let us know if your symptoms worsen or you notice anything new. If you have stomach upset after taking augmentin, give our clinic a call.  I have ordered the following labs today:  Lab Orders  No laboratory test(s) ordered today      Referrals ordered today:   Referral Orders  No referral(s) requested today     I have ordered the following medication/changed the following medications:   Stop the following medications: There are no discontinued medications.   Start the following medications: Meds ordered this encounter  Medications   amoxicillin-clavulanate (AUGMENTIN) 875-125 MG tablet    Sig: Take 1 tablet by mouth 2 (two) times daily.    Dispense:  10 tablet    Refill:  0     Follow-up: 2-3 months   Please make sure to arrive 15 minutes prior to your next appointment. If you arrive late, you may be asked to reschedule.   We look forward to seeing you next time. Please call our clinic at [phone] if you have any questions or concerns. The best time to call is Monday-Friday from 9am-4pm, but there is someone available 24/7. If after hours or the weekend, call the main hospital number and ask for the Internal Medicine Resident On-Call. If you need medication refills, please notify your pharmacy one week in advance and they will send [place] a request.  Thank you for letting us take part in your care. Wishing you the best!  Thank you, [name] MD

## 2022-09-09 NOTE — Assessment & Plan Note (Addendum)
Initial viral syndrome about 3 weeks ago with cough, runny nose, fevers.  Since then has had continued cough, nasal congestion, nasal discharge. Now having green mucus discharge with sinus congestion. Has had sinus infections in the past. Uses antihistamine for allergies. No smoking history.  Exam shows inflammation of nasal mucosa and no discharge.  Oropharynx is clear without any lesions, erythema, or exudates.  Overall, differential includes bacterial or viral sinusitis.  Given this has been going on for about 3 weeks with worsening symptoms we will go ahead and treat with 5-day course of Augmentin.  Counseled on usage and precautions.

## 2022-09-10 NOTE — Progress Notes (Signed)
   CC: Cough  HPI:  Edward [name] is a 72 y.o.-year-old male with past medical history as below presenting for cough.  Please see encounters tab for problem-based charting.  Past Medical History:  Diagnosis Date   Abnormal EKG 05/29/2018   Sinus bradycardia and LAFB with TWI V4-V6, III and aVF   Benign essential HTN 05/29/2018   Cardiomyopathy ([place])    a. dx by echo 07/2020.   Dilation of aorta (HCC)    Environmental allergies    Fracture, ribs    H/O: varicose veins    Hearing loss    Hyperlipidemia LDL goal <70    Multiple food allergies    Rash    Seasonal allergies    Sinusitis    Stroke [facility])    s/p tpa   Review of Systems: As in HPI.  Please see encounters tab for problem based charting.  Physical Exam:  Vitals:   09/09/22 1427  BP: 119/85  Pulse: 96  Temp: 98.1 F (36.7 C)  TempSrc: Oral  SpO2: 96%  Weight: 205 lb 6.4 oz (93.2 kg)  Height: 6' (1.829 m)   General:Well-appearing, pleasant, In NAD HEENT: Inflammation of nasal mucosa, no oropharyngeal lesions or exudates or erythema.  No discharge. Cardiac: RRR, no murmurs rubs or gallops. Respiratory: Normal work of breathing on room air, CTAB Abdominal: Soft, nontender, nondistended   Assessment & Plan:   Nasal congestion Initial viral syndrome about 3 weeks ago with cough, runny nose, fevers.  Since then has had continued cough, nasal congestion, nasal discharge. Now having green mucus discharge with sinus congestion. Has had sinus infections in the past. Uses antihistamine for allergies. No smoking history.  Exam shows inflammation of nasal mucosa and no discharge.  Oropharynx is clear without any lesions, erythema, or exudates.  Overall, differential includes bacterial or viral sinusitis.  Given this has been going on for about 3 weeks with worsening symptoms we will go ahead and treat with 5-day course of Augmentin.  Counseled on usage and precautions.   Patient discussed with Dr. Dareen Piano

## 2022-09-15 NOTE — Progress Notes (Signed)
Internal Medicine Clinic Attending  Case discussed with Dr. Jodell Cipro  At the time of the visit.  We reviewed the resident's history and exam and pertinent patient test results.  I agree with the assessment, diagnosis, and plan of care documented in the resident's note.

## 2022-09-28 DIAGNOSIS — R3914 Feeling of incomplete bladder emptying: Secondary | ICD-10-CM | POA: Diagnosis not present

## 2022-11-01 DIAGNOSIS — L570 Actinic keratosis: Secondary | ICD-10-CM | POA: Diagnosis not present

## 2022-11-01 DIAGNOSIS — C44529 Squamous cell carcinoma of skin of other part of trunk: Secondary | ICD-10-CM | POA: Diagnosis not present

## 2022-11-01 DIAGNOSIS — I8392 Asymptomatic varicose veins of left lower extremity: Secondary | ICD-10-CM | POA: Diagnosis not present

## 2022-11-01 DIAGNOSIS — D0471 Carcinoma in situ of skin of right lower limb, including hip: Secondary | ICD-10-CM | POA: Diagnosis not present

## 2022-11-01 DIAGNOSIS — L814 Other melanin hyperpigmentation: Secondary | ICD-10-CM | POA: Diagnosis not present

## 2022-11-01 DIAGNOSIS — L821 Other seborrheic keratosis: Secondary | ICD-10-CM | POA: Diagnosis not present

## 2022-11-01 DIAGNOSIS — L2089 Other atopic dermatitis: Secondary | ICD-10-CM | POA: Diagnosis not present

## 2022-11-01 DIAGNOSIS — Z85828 Personal history of other malignant neoplasm of skin: Secondary | ICD-10-CM | POA: Diagnosis not present

## 2022-11-15 ENCOUNTER — Ambulatory Visit: Payer: Medicare HMO

## 2022-11-15 ENCOUNTER — Ambulatory Visit (INDEPENDENT_AMBULATORY_CARE_PROVIDER_SITE_OTHER): Payer: Medicare HMO | Admitting: Student

## 2022-11-15 VITALS — BP 136/96 | HR 78 | Temp 98.3°F | Ht 72.0 in | Wt 205.0 lb

## 2022-11-15 DIAGNOSIS — R911 Solitary pulmonary nodule: Secondary | ICD-10-CM

## 2022-11-15 DIAGNOSIS — R1319 Other dysphagia: Secondary | ICD-10-CM | POA: Diagnosis not present

## 2022-11-15 DIAGNOSIS — I110 Hypertensive heart disease with heart failure: Secondary | ICD-10-CM

## 2022-11-15 DIAGNOSIS — I639 Cerebral infarction, unspecified: Secondary | ICD-10-CM

## 2022-11-15 DIAGNOSIS — J302 Other seasonal allergic rhinitis: Secondary | ICD-10-CM | POA: Diagnosis not present

## 2022-11-15 DIAGNOSIS — I5022 Chronic systolic (congestive) heart failure: Secondary | ICD-10-CM

## 2022-11-15 DIAGNOSIS — K219 Gastro-esophageal reflux disease without esophagitis: Secondary | ICD-10-CM | POA: Diagnosis not present

## 2022-11-15 DIAGNOSIS — I998 Other disorder of circulatory system: Secondary | ICD-10-CM | POA: Insufficient documentation

## 2022-11-15 DIAGNOSIS — I10 Essential (primary) hypertension: Secondary | ICD-10-CM

## 2022-11-15 DIAGNOSIS — E782 Mixed hyperlipidemia: Secondary | ICD-10-CM | POA: Diagnosis not present

## 2022-11-15 MED ORDER — OMEPRAZOLE 20 MG PO CPDR: 20.0000 mg | DELAYED_RELEASE_CAPSULE | Freq: Every day | ORAL | 0 refills | Status: DC

## 2022-11-15 NOTE — Assessment & Plan Note (Signed)
Pt endorses feeling of esophageal constriction in his mid-sternal region after meals. States symptoms occur once a week and have been present for the past year. Given history of GERD, there is concern for esophageal stricture. Will have him follow up with GI for endoscopy.   > GI referral

## 2022-11-15 NOTE — Assessment & Plan Note (Signed)
BP 136/96 in the office today. Pt reports good at home reading around 135/85. He has been compliant on his metoprolol, and spironolactone, but has been unable to obtain his [name] as he was unable to get his financial assistance. Given his history of HFrEF and comorbid HTN believe he would benefit from resuming this.  Will refill this for him today.   > metoprolol 25 mg > spironolactone 2.5 mg > Entresto 24-26 mg

## 2022-11-15 NOTE — Assessment & Plan Note (Signed)
Pt reports longstanding history of seasonal allergies with intermittent cough and wheezing. States symptoms became worse with past weekend while at home. Denies associated shortness of breath. Symptoms exacerbated by his cat, dust, and pollen. He has not tried anything for this. Will suggest antihistamine.   > Zyrtec

## 2022-11-15 NOTE — Progress Notes (Signed)
Subjective:   Patient ID: Edward [name] male   DOB: 04-Mar-1951 72 y.o.   MRN: [id number]  HPI: Edward [name] is a 72 y.o. male with a PMHx of HFrEF, HTN, HLD, CVA (2016), and GERD presents today for follow up.   Pt reports BP at home has been around 135/85 or lower. He is compliant on his metoprolol and spironolactone. Denies any adverse effects. States he has not taken his [name] since September or October as his refill was too expensive. He did apply for financial assistance but has been unsuccessful with this.   Reports no complications related to his heart failure. Denies any orthopnea, dyspnea, chest pain, cough, palpitations, or fatigue. States he is very active throughout the day for work and has remains asymptomatic during this time as well.   Pt is currently taking a baby aspirin and rosuvastatin 10 mg his CVA history. States he only takes the rosuvastatin on MWF as it precipitates muscle weakness. He has tried atorvastatin in the past with similar effects.   Pt does complain of some esophageal constrictions. He describes the symptoms as a non-radiating constricting sensation that occurs in his mid sternal region after eating. States symptoms usually occur once a week and have been present for the last few years.  Pt also complaining of ongoing acid reflux. States symptoms are worse with alcohol consumption, spicy foods, or laying flat postprandially. He does not take anything for this.   Pt does have a history of seasonal allergies which he feels has become worse since the weather has changes. States he has had intermittent dry cough as well as some mild wheezing worsening this past weekend. Denies any SOB. Symptoms are worse when patient is at home around his cat and dust. This does feel like his seasonal allergies. He has not tried anything at home for his symptoms.   Patient Active Problem List   Diagnosis Date Noted   GERD (gastroesophageal reflux disease) 11/15/2022    Esophageal dysphagia 11/15/2022   Seasonal allergies 11/15/2022   Nasal congestion 99991111   Chronic systolic heart failure ([place]) 07/02/2022   Myalgia due to statin 07/28/2021   Mixed hyperlipidemia 06/11/2020   Solitary pulmonary nodule 99991111   Umbilical hernia 99991111   Elevated LDL cholesterol level 08/08/2019   Ascending aorta dilation (HCC) 05/24/2019   Benign head tremor 05/24/2019   Abnormal EKG 05/29/2018   Benign essential HTN 05/29/2018     Current Outpatient Medications  Medication Sig Dispense Refill   amoxicillin-clavulanate (AUGMENTIN) 875-125 MG tablet Take 1 tablet by mouth 2 (two) times daily. 10 tablet 0   aspirin EC 81 MG tablet Take 81 mg by mouth daily. Swallow whole.     Cinnamon 500 MG capsule SMARTSIG:1 Capsule(s) By Mouth     Coenzyme Q10 (COQ-10) 100 MG CAPS SMARTSIG:1 Capsule(s) By Mouth     fluorouracil (EFUDEX) 5 % cream Apply topically as needed.     metoprolol succinate (TOPROL XL) 25 MG 24 hr tablet Take 1 tablet (25 mg total) by mouth daily. 90 tablet 3   mupirocin ointment (BACTROBAN) 2 % Apply 1 application. topically as needed.     N-ACETYL CYSTEINE 600 MG TABS SMARTSIG:1 Tablet(s) By Mouth     RA CRANBERRY 500 MG CAPS SMARTSIG:1 Capsule(s) By Mouth     rosuvastatin (CRESTOR) 10 MG tablet Take 1 tablet (10 mg total) by mouth every other day. 45 tablet 1   sacubitril-valsartan (ENTRESTO) 24-26 MG Take 1 tablet by mouth  2 (two) times daily. 180 tablet 3   sildenafil (VIAGRA) 100 MG tablet Take 1 tablet by mouth as needed.     spironolactone (ALDACTONE) 25 MG tablet Take 0.5 tablets (12.5 mg total) by mouth daily. 45 tablet 3   tacrolimus (PROTOPIC) 0.1 % ointment Apply 1 application. topically as needed.     tadalafil (CIALIS) 20 MG tablet Take 20 mg by mouth daily as needed.     tamsulosin (FLOMAX) 0.4 MG CAPS capsule Take 0.4 mg by mouth daily.     No current facility-administered medications for this visit.     Review of  Systems: ROS present in the HPI. Otherwise negative.   Objective:   Physical Exam: Vitals:   11/15/22 1347  BP: (!) 136/96  Pulse: 78  Temp: 98.3 F (36.8 C)  TempSrc: Oral  SpO2: 99%  Weight: 205 lb (93 kg)  Height: 6' (1.829 m)   General: Resting comfortably in no acute distress CV: Regular rate, rhythm. No murmurs appreciated. Warm extremities. Pulm: Normal work of breathing on room air. Clear to auscultation bilaterally. MSK: Normal bulk, tone. No peripheral edema noted. Neuro: Awake, alert, conversing appropriately. Psych: Normal mood, affect, speech.  Assessment & Plan:   Benign essential HTN BP 136/96 in the office today. Pt reports good at home reading around 135/85. He has been compliant on his metoprolol, and spironolactone, but has been unable to obtain his [name] as he was unable to get his financial assistance. Given his history of HFrEF and comorbid HTN believe he would benefit from resuming this.  Will refill this for him today.   > metoprolol 25 mg > spironolactone 2.5 mg > Entresto 24-26 mg  Chronic systolic heart failure (HCC) Pt denies orthopnea, palpitations, chest pain, dyspnea, cough, or fatigue. He has a very active job, and it able to withstand walking prolonged distances without the precipitation of symptoms. He compliant on his metoprolol and spirnolactone. Will refill the Entresto. Believe patient would benefit from and SGLT2 inhibitor as well in compliance with GDMT recommendations. Will add the [facility] today and reevaluate patient next month. If tolerated well, may consider adding [place].   > metoprolol 25 mg > spironolactone 2.5 mg > Entresto 24-25 mg  Mixed hyperlipidemia Pt is compliant on his baby aspirin. Pt reports taking is rosuvastatin 10 mg only on MWF as it causes him muscle weakness. Had a similar symptoms when taking atorvastatin in the past. LDL was 118 in October 2023. Given his history of prior CVA, would like to achieve goal  LDL <70. Plan to increase rosuvstatin to 4 times weakly as tolerated while continuing baby aspirin. Advised patient to decrease the dose to 3 times a week if he begins feeling weak.  > rosuvastatin x4 weekly > aspirin 81 mg  GERD (gastroesophageal reflux disease) Pt reports history of acid reflux made worse with spicy foods, alcohol consumption, or laying flat after meals. Has not tried anything at home for this. States he would like to try over the counter antacids before attempting Protonix. Feel this is appropriate.  > Pepto Bismol as needed  Esophageal dysphagia Pt endorses feeling of esophageal constriction in his mid-sternal region after meals. States symptoms occur once a week and have been present for the past year. Given history of GERD, there is concern for esophageal stricture. Will have him follow up with GI for endoscopy.   > GI referral   Seasonal allergies Pt reports longstanding history of seasonal allergies with intermittent cough and wheezing. States  symptoms became worse with past weekend while at home. Denies associated shortness of breath. Symptoms exacerbated by his cat, dust, and pollen. He has not tried anything for this. Will suggest antihistamine.   > Zyrtec

## 2022-11-15 NOTE — Assessment & Plan Note (Signed)
Pt is compliant on his baby aspirin. Pt reports taking is rosuvastatin 10 mg only on MWF as it causes him muscle weakness. Had a similar symptoms when taking atorvastatin in the past. LDL was 118 in October 2023. Given his history of prior CVA, would like to achieve goal LDL <70. Plan to increase rosuvstatin to 4 times weakly as tolerated while continuing baby aspirin. Advised patient to decrease the dose to 3 times a week if he begins feeling weak.  > rosuvastatin x4 weekly > aspirin 81 mg

## 2022-11-15 NOTE — Assessment & Plan Note (Signed)
Pt denies orthopnea, palpitations, chest pain, dyspnea, cough, or fatigue. He has a very active job, and it able to withstand walking prolonged distances without the precipitation of symptoms. He compliant on his metoprolol and spirnolactone. Will refill the Entresto. Believe patient would benefit from and SGLT2 inhibitor as well in compliance with GDMT recommendations. Will add the [facility] today and reevaluate patient next month. If tolerated well, may consider adding [place].   > metoprolol 25 mg > spironolactone 2.5 mg > Entresto 24-25 mg

## 2022-11-15 NOTE — Progress Notes (Signed)
Attestation for Student Documentation:  I personally was present and performed or re-performed the history, physical exam and medical decision-making activities of this service and have verified that the service and findings are accurately documented in the student's note.  Mr. Holohan is a 72 y/o person living with a history of HFrEF, non-ischemic cardiomyopathy, CVA in 2016 s/p tPA, HTN, ascending aortic dilation, GERD who presents for follow up in our clinic.   He has an acute concern of what he describes as food getting stuck in his chest which seems consistent with esophageal dysphagia. With his age and history of GERD will refer to GI for further evaluation and consideration of endoscopy. Recommending OTC omeprazole, he would like to try this before a prescription.   He remembers having a stroke while in [place] in 2016. I was able to find the records in care everywhere at [facility] [place] [facility]. He presented with sudden onset visual field deficit and right arm numbness/tingling. He was given tPA and transferred to the ICU. He was found to have an ischemic CVA at the left posterior cerebral artery. Since he has been on aspirin 81 mg daily. He has had difficulty with statin therapy, endorsing myalgias with rosuvastatin and atorvastatin. He has been taking rosuvastatin three times weekly. Recommended increasing this as his last LDL was not at goal of 70 for secondary prevention.   He is asymptomatic from his HFrEF (NYHA I). Recent coronary CT normal. He is adherent to GDMT of metoprolol 25 mg daily and aldactone 12.5 mg daily. He has not been taking entresto as he no longer has medication assistance. He is hesitant to start an SGLT-2i as does not feel as though he was given a good reason, at follow up would delve into this more. For now, will work with our nurses for a PA for his entresto.   I would like him to follow up in one month with many changes as per above and to discuss  addition of SGLT-2i.  Also has history of 6 mm left lower lung nodule, last imaged 2022. Will order repeat imaging for follow up.   Riesa Pope, MD 11/15/2022, 8:51 PM

## 2022-11-15 NOTE — Assessment & Plan Note (Signed)
Assessment: 6 mm pulmonary nodule in the left lower lobe, due to repeat imaging.   Plan: - will place order for follow up nodule chest CT

## 2022-11-15 NOTE — Assessment & Plan Note (Addendum)
Pt reports history of acid reflux made worse with spicy foods, alcohol consumption, or laying flat after meals. Has not tried anything at home for this. States he would like to try over the counter antacids before attempting Protonix. Feel this is appropriate.  > omeprazole daily

## 2022-11-15 NOTE — Patient Instructions (Addendum)
Today we discussed the following:  1) Hypertension  Please continue taking your metoprolol and spironolactone as prescribed. Resume taking your Entresto   2) Hyperlipidemia  Please increase your rosuvastatin to 4 times a week as tolerated. If you begin feeling weak, decrease the dose to 3 times a week as before. Also, continue to take your baby aspirin.   3) Acid Reflux Please begin taking omeprazole  4) Esophageal dysphagia Please follow up with the GI specialist   5) Seasonal allergies  Please begin taking Zyrtec

## 2022-11-16 MED ORDER — OMEPRAZOLE 20 MG PO CPDR: 20.0000 mg | DELAYED_RELEASE_CAPSULE | Freq: Every day | ORAL | 0 refills | Status: DC

## 2022-11-16 NOTE — Addendum Note (Signed)
Addended by: Riesa Pope on: 11/16/2022 09:14 AM   Modules accepted: Orders

## 2022-11-17 NOTE — Progress Notes (Signed)
Internal Medicine Clinic Attending  Case discussed with Dr. Katsadouros at the time of the visit.  We reviewed the resident's history and exam and pertinent patient test results.  I agree with the assessment, diagnosis, and plan of care documented in the resident's note.  

## 2022-12-15 ENCOUNTER — Other Ambulatory Visit: Payer: Self-pay | Admitting: Student

## 2022-12-16 ENCOUNTER — Ambulatory Visit (INDEPENDENT_AMBULATORY_CARE_PROVIDER_SITE_OTHER): Payer: Medicare HMO

## 2022-12-16 VITALS — BP 111/76 | HR 73 | Temp 98.7°F | Ht 72.0 in | Wt 210.0 lb

## 2022-12-16 DIAGNOSIS — J302 Other seasonal allergic rhinitis: Secondary | ICD-10-CM

## 2022-12-16 DIAGNOSIS — I5022 Chronic systolic (congestive) heart failure: Secondary | ICD-10-CM

## 2022-12-16 MED ORDER — DAPAGLIFLOZIN PROPANEDIOL 10 MG PO TABS: 10.0000 mg | ORAL_TABLET | Freq: Every day | ORAL | 2 refills | Status: AC

## 2022-12-16 NOTE — Patient Instructions (Signed)
Edward [name], it was a pleasure seeing you today!  Today we discussed: Allergies - continue zyrtec, add fluticasone (2 sprays each nostril twice daily), nasal irrigation if needed. Call clinic if symptoms worsen or fail to improve by late next week (especially fever, worsening facial pain).  Heart failure treatment - Follow up with Dr. Mayford [name]. Start [name] once daily.  I have ordered the following labs today:  Lab Orders  No laboratory test(s) ordered today     Tests ordered today:  none  Referrals ordered today:   Referral Orders  No referral(s) requested today     I have ordered the following medication/changed the following medications:   Stop the following medications: There are no discontinued medications.   Start the following medications: No orders of the defined types were placed in this encounter.    Follow-up: 3 months   Please make sure to arrive 15 minutes prior to your next appointment. If you arrive late, you may be asked to reschedule.   We look forward to seeing you next time. Please call our clinic at [phone] if you have any questions or concerns. The best time to call is Monday-Friday from 9am-4pm, but there is someone available 24/7. If after hours or the weekend, call the main hospital number and ask for the Internal Medicine Resident On-Call. If you need medication refills, please notify your pharmacy one week in advance and they will send [place] a request.  Thank you for letting us take part in your care. Wishing you the best!  Thank you, Adron Bene, MD

## 2022-12-17 ENCOUNTER — Encounter: Payer: Self-pay | Admitting: Internal Medicine

## 2022-12-17 NOTE — Assessment & Plan Note (Signed)
Patient reports chronic allergies that are likely related to pollen and other seasonal allergens.  Symptoms most recently began 5 days ago and are predominantly congestion/rhinorrhea.  Endorses maxillary pain/pressure.  Denies shortness of breath, chest pain, productive cough.  Does have some phlegm in the back of his throat but mucus in nose and throat have been clear.  He has been taking Zyrtec.  Denies fevers or chills.  Afebrile today.  Oropharynx, upper and lower airways nonconcerning on exam.  Feel this is likely to be acute sinusitis without bacterial component or pneumonia. -Add fluticasone nasal spray 2 sprays per nostril twice daily -Nasal irrigation as needed -Instructed to let us know if his symptoms fail to improve or worsen by the end of next week

## 2022-12-17 NOTE — Progress Notes (Signed)
CC: Allergies  HPI:  [name] is a 72 y.o. patient presents with past medical history as below with chief complaint of sinus congestion.  Please see detailed assessment and plan for HPI.  Past Medical History:  Diagnosis Date   Abnormal EKG 05/29/2018   Sinus bradycardia and LAFB with TWI V4-V6, III and aVF   Benign essential HTN 05/29/2018   Cardiomyopathy (HCC)    a. dx by echo 07/2020.   Dilation of aorta (HCC)    Environmental allergies    Fracture, ribs    H/O: varicose veins    Hearing loss    Hyperlipidemia LDL goal <70    Multiple food allergies    Rash    Seasonal allergies    Sinusitis    Stroke [facility])    s/p tpa   Review of Systems: See detailed assessment and plan for pertinent ROS.  Physical Exam:  Vitals:   12/16/22 1613  BP: 111/76  Pulse: 73  Temp: 98.7 F (37.1 C)  TempSrc: Oral  SpO2: 95%  Weight: 210 lb (95.3 kg)  Height: 6' (1.829 m)   Physical Exam Constitutional:      General: He is not in acute distress. HENT:     Head: Normocephalic and atraumatic.     Right Ear: Tympanic membrane and external ear normal.     Left Ear: Tympanic membrane and external ear normal.     Nose: Congestion and rhinorrhea present.     Mouth/Throat:     Mouth: Mucous membranes are moist.     Pharynx: Oropharynx is clear. No oropharyngeal exudate or posterior oropharyngeal erythema.  Eyes:     Extraocular Movements: Extraocular movements intact.  Cardiovascular:     Rate and Rhythm: Normal rate and regular rhythm.     Heart sounds: No murmur heard. Pulmonary:     Effort: Pulmonary effort is normal.     Breath sounds: No wheezing, rhonchi or rales.  Musculoskeletal:     Cervical back: Neck supple.     Right lower leg: No edema.     Left lower leg: No edema.  Skin:    General: Skin is warm and dry.  Neurological:     Mental Status: He is alert and oriented to person, place, and time.  Psychiatric:        Mood and Affect: Mood normal.         Behavior: Behavior normal.      Assessment & Plan:   See Encounters Tab for problem based charting.  Chronic systolic heart failure (HCC) Patient presents with history of heart failure, EF 35 to 40%.  Discussed addition of SGLT2 inhibitor today and patient is amenable.  He has not been able to take his Sherryll Burger due to insurance problems.  Note in chart states nursing is working on prior authorization, so I will inquire about this.  Blood pressure 111/76 today.  Normal heart and lung exam, no lower extremity edema.  Satting well on room air. -Continue metoprolol 25 mg daily, spironolactone 12.5 mg daily -Add Farxiga 10 mg  Seasonal allergies Patient reports chronic allergies that are likely related to pollen and other seasonal allergens.  Symptoms most recently began 5 days ago and are predominantly congestion/rhinorrhea.  Endorses maxillary pain/pressure.  Denies shortness of breath, chest pain, productive cough.  Does have some phlegm in the back of his throat but mucus in nose and throat have been clear.  He has been taking Zyrtec.  Denies fevers or chills.  Afebrile today.  Oropharynx, upper and lower airways nonconcerning on exam.  Feel this is likely to be acute sinusitis without bacterial component or pneumonia. -Add fluticasone nasal spray 2 sprays per nostril twice daily -Nasal irrigation as needed -Instructed to let us know if his symptoms fail to improve or worsen by the end of next week  Patient discussed with Dr. Criselda Peaches

## 2022-12-17 NOTE — Assessment & Plan Note (Signed)
Patient presents with history of heart failure, EF 35 to 40%.  Discussed addition of SGLT2 inhibitor today and patient is amenable.  He has not been able to take his Sherryll Burger due to insurance problems.  Note in chart states nursing is working on prior authorization, so I will inquire about this.  Blood pressure 111/76 today.  Normal heart and lung exam, no lower extremity edema.  Satting well on room air. -Continue metoprolol 25 mg daily, spironolactone 12.5 mg daily -Add Farxiga 10 mg

## 2022-12-21 ENCOUNTER — Ambulatory Visit
Admission: RE | Admit: 2022-12-21 | Discharge: 2022-12-21 | Disposition: A | Discharge status: Home / Self Care | Payer: Medicare HMO | Source: Ambulatory Visit | Attending: Internal Medicine | Admitting: Internal Medicine

## 2022-12-21 DIAGNOSIS — R911 Solitary pulmonary nodule: Secondary | ICD-10-CM | POA: Diagnosis not present

## 2022-12-21 DIAGNOSIS — I77810 Thoracic aortic ectasia: Secondary | ICD-10-CM

## 2022-12-21 DIAGNOSIS — I700 Atherosclerosis of aorta: Secondary | ICD-10-CM | POA: Diagnosis not present

## 2022-12-21 DIAGNOSIS — J9811 Atelectasis: Secondary | ICD-10-CM | POA: Diagnosis not present

## 2022-12-24 ENCOUNTER — Other Ambulatory Visit: Payer: Self-pay | Admitting: Nurse Practitioner

## 2022-12-27 ENCOUNTER — Other Ambulatory Visit: Payer: Self-pay | Admitting: Student

## 2022-12-27 ENCOUNTER — Encounter: Payer: Medicare HMO | Admitting: Nurse Practitioner

## 2022-12-27 NOTE — Assessment & Plan Note (Signed)
Ascending aortic dilation seen on prior imaging, also seen on CT chest performed recently for pulmonary nodule surveillance. Will need to continue annual screening.

## 2022-12-27 NOTE — Assessment & Plan Note (Signed)
CT imaging 2024 with stable pulmonary nodule. Continue follow up imaging annually.

## 2022-12-30 NOTE — Progress Notes (Signed)
Internal Medicine Clinic Attending  Case discussed with Dr. White  at the time of the visit.  We reviewed the resident's history and exam and pertinent patient test results.  I agree with the assessment, diagnosis, and plan of care documented in the resident's note.  

## 2022-12-30 NOTE — Addendum Note (Signed)
Addended by: Debe Coder B on: 12/30/2022 10:53 AM   Modules accepted: Level of Service

## 2023-01-04 ENCOUNTER — Telehealth: Payer: Self-pay

## 2023-01-04 ENCOUNTER — Other Ambulatory Visit (HOSPITAL_COMMUNITY): Payer: Self-pay

## 2023-01-04 NOTE — Telephone Encounter (Signed)
-----   Message from Belva Agee, MD sent at 12/31/2022 12:58 PM EDT ----- He doesn't follow up with them anymore so our office please! ----- Message ----- From: Otho Najjar, CPhT Sent: 12/31/2022  12:57 PM EDT To: Belva Agee, MD  Hey!  Looks like they were working on his enrollment at [facility]iology back in December, however seems it may have been incomplete. Is this a med they will be handling there, or will it be kept up with here at our office? ----- Message ----- From: Belva Agee, MD Sent: 12/29/2022   5:09 PM EDT To: Otho Najjar, CPhT  Hey Durward Mallard  I wanted to follow up on entresto assistance for this patient?   Thanks  [employer]

## 2023-01-04 NOTE — Telephone Encounter (Signed)
Mailed [employer] application to patients home for [employer] assistance.

## 2023-01-06 ENCOUNTER — Telehealth: Payer: Self-pay | Admitting: Student

## 2023-01-06 NOTE — Telephone Encounter (Signed)
Contacted Edward [name] to schedule their annual wellness visit. Appointment made for 01/19/2023.  [facility] Care Guide [facility] AWV TEAM Direct Dial: [phone]

## 2023-01-14 ENCOUNTER — Other Ambulatory Visit: Payer: Self-pay

## 2023-01-19 ENCOUNTER — Telehealth: Payer: Self-pay | Admitting: Student

## 2023-01-19 ENCOUNTER — Ambulatory Visit (INDEPENDENT_AMBULATORY_CARE_PROVIDER_SITE_OTHER): Payer: Medicare HMO

## 2023-01-19 VITALS — Wt 205.0 lb

## 2023-01-19 DIAGNOSIS — Z0000 Encounter for general adult medical examination without abnormal findings: Secondary | ICD-10-CM

## 2023-01-19 NOTE — Patient Instructions (Signed)
Edward [name] , Thank you for taking time to come for your Medicare Wellness Visit. I appreciate your ongoing commitment to your health goals. Please review the following plan we discussed and let me know if I can assist you in the future.   These are the goals we discussed:  Goals      Manage My Medicine     Timeframe:  Long-Range Goal Priority:  High Start Date:      06/23/2021                       Expected End Date:  06/23/2022                     Follow Up: 12/18/2021   - call for medicine refill 2 or 3 days before it runs out - keep a list of all the medicines I take; vitamins and herbals too - use a pillbox to sort medicine    Why is this important?   These steps will help you keep on track with your medicines.        Patient Stated     05/24/2019, wants to get back to the gym     Patient Stated     06/11/2020, wants to get cholesterol in line     Patient Stated     06/17/2021, wants to get back to the gym        This is a list of the screening recommended for you and due dates:  Health Maintenance  Topic Date Due   DTaP/Tdap/Td vaccine (1 - Tdap) Never done   COVID-19 Vaccine (2 - Janssen risk series) 01/26/2020   Zoster (Shingles) Vaccine (1 of 2) 02/08/2024*   Pneumonia Vaccine (2 of 2 - PPSV23 or PCV20) 03/14/2024*   Flu Shot  03/31/2023   Medicare Annual Wellness Visit  01/19/2024   Colon Cancer Screening  10/22/2027   Hepatitis C Screening: USPSTF Recommendation to screen - Ages 18-79 yo.  Completed   HPV Vaccine  Aged Out  *Topic was postponed. The date shown is not the original due date.    Advanced directives: Advance directive discussed with you today. Even though you declined this today, please call our office should you change your mind, and we can give you the proper paperwork for you to fill out.   Conditions/risks identified: Aim for 30 minutes of exercise or brisk walking, 6-8 glasses of water, and 5 servings of fruits and vegetables each day.    Next appointment: Follow up in one year for your annual wellness visit. 01/20/24  Preventive Care 70 Years and Older, Male  Preventive care refers to lifestyle choices and visits with your health care provider that can promote health and wellness. What does preventive care include? A yearly physical exam. This is also called an annual well check. Dental exams once or twice a year. Routine eye exams. Ask your health care provider how often you should have your eyes checked. Personal lifestyle choices, including: Daily care of your teeth and gums. Regular physical activity. Eating a healthy diet. Avoiding tobacco and drug use. Limiting alcohol use. Practicing safe sex. Taking low doses of aspirin every day. Taking vitamin and mineral supplements as recommended by your health care provider. What happens during an annual well check? The services and screenings done by your health care provider during your annual well check will depend on your age, overall health, lifestyle risk factors, and family history of disease. Counseling  Your health care provider may ask you questions about your: Alcohol use. Tobacco use. Drug use. Emotional well-being. Home and relationship well-being. Sexual activity. Eating habits. History of falls. Memory and ability to understand (cognition). Work and work Astronomer. Screening  You may have the following tests or measurements: Height, weight, and BMI. Blood pressure. Lipid and cholesterol levels. These may be checked every 5 years, or more frequently if you are over 32 years old. Skin check. Lung cancer screening. You may have this screening every year starting at age 13 if you have a 30-pack-year history of smoking and currently smoke or have quit within the past 15 years. Fecal occult blood test (FOBT) of the stool. You may have this test every year starting at age 61. Flexible sigmoidoscopy or colonoscopy. You may have a sigmoidoscopy every 5  years or a colonoscopy every 10 years starting at age 4. Prostate cancer screening. Recommendations will vary depending on your family history and other risks. Hepatitis C blood test. Hepatitis B blood test. Sexually transmitted disease (STD) testing. Diabetes screening. This is done by checking your blood sugar (glucose) after you have not eaten for a while (fasting). You may have this done every 1-3 years. Abdominal aortic aneurysm (AAA) screening. You may need this if you are a current or former smoker. Osteoporosis. You may be screened starting at age 61 if you are at high risk. Talk with your health care provider about your test results, treatment options, and if necessary, the need for more tests. Vaccines  Your health care provider may recommend certain vaccines, such as: Influenza vaccine. This is recommended every year. Tetanus, diphtheria, and acellular pertussis (Tdap, Td) vaccine. You may need a Td booster every 10 years. Zoster vaccine. You may need this after age 70. Pneumococcal 13-valent conjugate (PCV13) vaccine. One dose is recommended after age 70. Pneumococcal polysaccharide (PPSV23) vaccine. One dose is recommended after age 72. Talk to your health care provider about which screenings and vaccines you need and how often you need them. This information is not intended to replace advice given to you by your health care provider. Make sure you discuss any questions you have with your health care provider. Document Released: 09/12/2015 Document Revised: 05/05/2016 Document Reviewed: 06/17/2015 Elsevier Interactive Patient Education  2017 [employer].  Fall Prevention in the Home Falls can cause injuries. They can happen to people of all ages. There are many things you can do to make your home safe and to help prevent falls. What can I do on the outside of my home? Regularly fix the edges of walkways and driveways and fix any cracks. Remove anything that might make you  trip as you walk through a door, such as a raised step or threshold. Trim any bushes or trees on the path to your home. Use bright outdoor lighting. Clear any walking paths of anything that might make someone trip, such as rocks or tools. Regularly check to see if handrails are loose or broken. Make sure that both sides of any steps have handrails. Any raised decks and porches should have guardrails on the edges. Have any leaves, snow, or ice cleared regularly. Use sand or salt on walking paths during winter. Clean up any spills in your garage right away. This includes oil or grease spills. What can I do in the bathroom? Use night lights. Install grab bars by the toilet and in the tub and shower. Do not use towel bars as grab bars. Use non-skid mats or decals  in the tub or shower. If you need to sit down in the shower, use a plastic, non-slip stool. Keep the floor dry. Clean up any water that spills on the floor as soon as it happens. Remove soap buildup in the tub or shower regularly. Attach bath mats securely with double-sided non-slip rug tape. Do not have throw rugs and other things on the floor that can make you trip. What can I do in the bedroom? Use night lights. Make sure that you have a light by your bed that is easy to reach. Do not use any sheets or blankets that are too big for your bed. They should not hang down onto the floor. Have a firm chair that has side arms. You can use this for support while you get dressed. Do not have throw rugs and other things on the floor that can make you trip. What can I do in the kitchen? Clean up any spills right away. Avoid walking on wet floors. Keep items that you use a lot in easy-to-reach places. If you need to reach something above you, use a strong step stool that has a grab bar. Keep electrical cords out of the way. Do not use floor polish or wax that makes floors slippery. If you must use wax, use non-skid floor wax. Do not have  throw rugs and other things on the floor that can make you trip. What can I do with my stairs? Do not leave any items on the stairs. Make sure that there are handrails on both sides of the stairs and use them. Fix handrails that are broken or loose. Make sure that handrails are as long as the stairways. Check any carpeting to make sure that it is firmly attached to the stairs. Fix any carpet that is loose or worn. Avoid having throw rugs at the top or bottom of the stairs. If you do have throw rugs, attach them to the floor with carpet tape. Make sure that you have a light switch at the top of the stairs and the bottom of the stairs. If you do not have them, ask someone to add them for you. What else can I do to help prevent falls? Wear shoes that: Do not have high heels. Have rubber bottoms. Are comfortable and fit you well. Are closed at the toe. Do not wear sandals. If you use a stepladder: Make sure that it is fully opened. Do not climb a closed stepladder. Make sure that both sides of the stepladder are locked into place. Ask someone to hold it for you, if possible. Clearly mark and make sure that you can see: Any grab bars or handrails. First and last steps. Where the edge of each step is. Use tools that help you move around (mobility aids) if they are needed. These include: Canes. Walkers. Scooters. Crutches. Turn on the lights when you go into a dark area. Replace any light bulbs as soon as they burn out. Set up your furniture so you have a clear path. Avoid moving your furniture around. If any of your floors are uneven, fix them. If there are any pets around you, be aware of where they are. Review your medicines with your doctor. Some medicines can make you feel dizzy. This can increase your chance of falling. Ask your doctor what other things that you can do to help prevent falls. This information is not intended to replace advice given to you by your health care provider.  Make sure you  discuss any questions you have with your health care provider. Document Released: 06/12/2009 Document Revised: 01/22/2016 Document Reviewed: 09/20/2014 Elsevier Interactive Patient Education  2017 [employer].

## 2023-01-19 NOTE — Progress Notes (Signed)
Subjective:   Edward [name] is a 72 y.o. male who presents for Medicare Annual/Subsequent preventive examination.  Review of Systems    I connected with  Edward [name] on 01/19/23 by a audio enabled telemedicine application and verified that I am speaking with the correct person using two identifiers.  Patient Location: Home  Provider Location: Home Office  I discussed the limitations of evaluation and management by telemedicine. The patient expressed understanding and agreed to proceed.  Cardiac Risk Factors include: advanced age (>50men, >18 women);hypertension     Objective:    Today's Vitals   01/19/23 1528  Weight: 205 lb (93 kg)   Body mass index is 27.8 kg/m.     01/19/2023    3:34 PM 12/16/2022    4:13 PM 11/15/2022    1:48 PM 09/09/2022    2:34 PM 06/29/2022    4:06 PM 06/17/2021    3:21 PM 06/11/2020   11:05 AM  Advanced Directives  Does Patient Have a Medical Advance Directive? No No No No No No No  Would patient like information on creating a medical advance directive? No - Patient declined No - Patient declined No - Patient declined No - Patient declined Yes (MAU/Ambulatory/Procedural Areas - Information given) Yes (MAU/Ambulatory/Procedural Areas - Information given) No - Patient declined    Current Medications (verified) Outpatient Encounter Medications as of 01/19/2023  Medication Sig   aspirin EC 81 MG tablet Take 81 mg by mouth daily. Swallow whole.   Cinnamon 500 MG capsule SMARTSIG:1 Capsule(s) By Mouth   Coenzyme Q10 (COQ-10) 100 MG CAPS SMARTSIG:1 Capsule(s) By Mouth   dapagliflozin propanediol (FARXIGA) 10 MG TABS tablet Take 1 tablet (10 mg total) by mouth daily before breakfast.   fluorouracil (EFUDEX) 5 % cream Apply topically as needed.   metoprolol succinate (TOPROL XL) 25 MG 24 hr tablet Take 1 tablet (25 mg total) by mouth daily.   mupirocin ointment (BACTROBAN) 2 % Apply 1 application. topically as needed.   N-ACETYL CYSTEINE 600 MG TABS  SMARTSIG:1 Tablet(s) By Mouth   omeprazole (PRILOSEC) 20 MG capsule TAKE 1 CAPSULE BY MOUTH EVERY DAY   RA CRANBERRY 500 MG CAPS SMARTSIG:1 Capsule(s) By Mouth   sacubitril-valsartan (ENTRESTO) 24-26 MG Take 1 tablet by mouth 2 (two) times daily.   sildenafil (VIAGRA) 100 MG tablet Take 1 tablet by mouth as needed.   spironolactone (ALDACTONE) 25 MG tablet Take 0.5 tablets (12.5 mg total) by mouth daily.   tacrolimus (PROTOPIC) 0.1 % ointment Apply 1 application. topically as needed.   tadalafil (CIALIS) 20 MG tablet Take 20 mg by mouth daily as needed.   tamsulosin (FLOMAX) 0.4 MG CAPS capsule Take 0.4 mg by mouth daily.   rosuvastatin (CRESTOR) 10 MG tablet Take 1 tablet (10 mg total) by mouth every other day.   No facility-administered encounter medications on file as of 01/19/2023.    Allergies (verified) Patient has no known allergies.   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 dx by echo 07/2020.   Dilation of aorta (HCC)    Environmental allergies    Fracture, ribs    H/O: varicose veins    Hearing loss    Hyperlipidemia LDL goal <70    Multiple food allergies    Rash    Seasonal allergies    Sinusitis  Stroke [facility])    s/p tpa   Past Surgical History:  Procedure Laterality Date   COLONOSCOPY     INGUINAL HERNIA REPAIR Left    right hand surgery     Family History  Problem Relation Age of Onset   CVA Mother    Heart failure Father        CHF   Brain cancer Brother        GBM   Colon polyps Neg Hx    Colon cancer Neg Hx    Esophageal cancer Neg Hx    Rectal cancer Neg Hx    Stomach cancer Neg Hx    Social History   Socioeconomic History   Marital status: Married    Spouse name: Not on file   Number of children: Not on file   Years of education: Not on file   Highest education level: Not on file   Occupational History   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  Tobacco Use   Smoking status: Former   Smokeless tobacco: Never   Tobacco comments:    quit 40+ years ago  Vaping Use   Vaping Use: Never used  Substance and Sexual Activity   Alcohol use: Yes    Comment: wine 3 x a week   Drug use: Never   Sexual activity: Yes    Partners: Female  Other Topics Concern   Not on file  Social History Narrative   Not on file   Social Determinants of Health   Financial Resource Strain: Low Risk  (01/19/2023)   Overall Financial Resource Strain (CARDIA)    Difficulty of Paying Living Expenses: Not hard at all  Food Insecurity: No Food Insecurity (01/19/2023)   Hunger Vital Sign    Worried About Running Out of Food in the Last Year: Never true    Ran Out of Food in the Last Year: Never true  Transportation Needs: No Transportation Needs (06/17/2021)   PRAPARE - Administrator, Civil Service (Medical): No    Lack of Transportation (Non-Medical): No  Physical Activity: Sufficiently Active (01/19/2023)   Exercise Vital Sign    Days of Exercise per Week: 7 days    Minutes of Exercise per Session: 30 min  Stress: No Stress Concern Present (01/19/2023)   [employer] of Occupational Health - Occupational Stress Questionnaire    Feeling of Stress : Not at all  Social Connections: Socially Integrated (01/19/2023)   Social Connection and Isolation Panel [NHANES]    Frequency of Communication with Friends and Family: More than three times a week    Frequency of Social Gatherings with Friends and Family: More than three times a week    Attends Religious Services: More than 4 times per year    Active Member of [employer] or Organizations: Yes    Attends Engineer, structural: More than 4 times per year    Marital Status: Married    Tobacco Counseling Counseling given: Yes Tobacco comments: quit 40+ years ago   Clinical Intake:  Pre-visit preparation  completed: Yes  Pain : No/denies pain     BMI - recorded: 27.8 Nutritional Risks: None Diabetes: No  How often do you need to have someone help you when you read instructions, pamphlets, or other written materials from your doctor or pharmacy?: 1 - Never  Diabetic?No  Interpreter Needed?: No  Information entered by :: Fredirick Maudlin   Activities of Daily Living    01/19/2023  3:36 PM 12/16/2022    4:19 PM  In your present state of health, do you have any difficulty performing the following activities:  Hearing? 0 0  Vision? 0 0  Difficulty concentrating or making decisions? 0 0  Walking or climbing stairs? 1 0  Dressing or bathing? 0 0  Doing errands, shopping? 0 0  Preparing Food and eating ? N   Using the Toilet? N   In the past six months, have you accidently leaked urine? N   Do you have problems with loss of bowel control? N   Managing your Medications? N   Managing your Finances? N   Housekeeping or managing your Housekeeping? N     Patient Care Team: Evlyn Kanner, MD as PCP - General (Internal Medicine) Harlan Stains, [facility] (Pharmacist)  Indicate any recent Medical Services you may have received from other than [facility] providers in the past year (date may be approximate).     Assessment:   This is a routine wellness examination for Edward [name].  Hearing/Vision screen Hearing Screening - Comments:: Denies hearing difficulties   Vision Screening - Comments:: Wears rx glasses - up to date with routine eye exams with  [facility]  Dietary issues and exercise activities discussed: Current Exercise Habits: The patient has a physically strenuous job, but has no regular exercise apart from work.;The patient does not participate in regular exercise at present, Exercise limited by: None identified   Goals Addressed             This Visit's Progress    Patient Stated   On track    06/17/2021, wants to get back to the gym      Depression  Screen    01/19/2023    3:31 PM 11/15/2022    1:48 PM 09/09/2022    2:34 PM 04/15/2022    3:18 PM 04/15/2022    3:09 PM 06/17/2021    3:22 PM 06/17/2021    2:53 PM  PHQ 2/9 Scores  PHQ - 2 Score 0 0 0 0 0 0 0    Fall Risk    01/19/2023    3:36 PM 12/16/2022    4:20 PM 11/15/2022    1:48 PM 09/09/2022    2:34 PM 06/29/2022    4:05 PM  Fall Risk   Falls in the past year? 0 0 0 0 0  Number falls in past yr: 0 0 0 0 0  Injury with Fall? 0 0 0 0 0  Risk for fall due to : No Fall Risks   No Fall Risks No Fall Risks  Follow up Falls prevention discussed;Falls evaluation completed Falls evaluation completed Falls evaluation completed Falls evaluation completed;Falls prevention discussed     FALL RISK PREVENTION PERTAINING TO THE HOME:  Any stairs in or around the home? Yes  If so, are there any without handrails? No  Home free of loose throw rugs in walkways, pet beds, electrical cords, etc? No  Adequate lighting in your home to reduce risk of falls? Yes   ASSISTIVE DEVICES UTILIZED TO PREVENT FALLS:  Life alert? No  Use of a cane, walker or w/c? No  Grab bars in the bathroom? No  Shower chair or bench in shower? No  Elevated toilet seat or a handicapped toilet? No   TIMED UP AND GO:  Was the test performed?  No televisit .    Cognitive Function:        01/19/2023    3:31 PM 06/17/2021  3:23 PM 06/11/2020   11:07 AM 05/24/2019   12:00 PM  6CIT Screen  What Year? 0 points 0 points 0 points 0 points  What month? 0 points 0 points 0 points 0 points  What time? 0 points 0 points 0 points 0 points  Count back from 20 0 points 0 points 0 points 0 points  Months in reverse 0 points 0 points 0 points 0 points  Repeat phrase 0 points 2 points 0 points 0 points  Total Score 0 points 2 points 0 points 0 points    Immunizations Immunization History  Administered Date(s) Administered   Fluad Quad(high Dose 65+) 06/11/2020   Janssen (J&J) SARS-COV-2 Vaccination 12/29/2019    Pneumococcal Conjugate-13 05/24/2019   Pneumococcal-Unspecified 06/29/2020    TDAP status: Due, Education has been provided regarding the importance of this vaccine. Advised may receive this vaccine at local pharmacy or Health Dept. Aware to provide a copy of the vaccination record if obtained from local pharmacy or Health Dept. Verbalized acceptance and understanding.  Flu Vaccine status: Due, Education has been provided regarding the importance of this vaccine. Advised may receive this vaccine at local pharmacy or Health Dept. Aware to provide a copy of the vaccination record if obtained from local pharmacy or Health Dept. Verbalized acceptance and understanding.  Pneumococcal vaccine status: Due, Education has been provided regarding the importance of this vaccine. Advised may receive this vaccine at local pharmacy or Health Dept. Aware to provide a copy of the vaccination record if obtained from local pharmacy or Health Dept. Verbalized acceptance and understanding.  Covid-19 vaccine status: Information provided on how to obtain vaccines.   Qualifies for Shingles Vaccine? Yes   Zostavax completed No   Shingrix Completed?: No.    Education has been provided regarding the importance of this vaccine. Patient has been advised to call insurance company to determine out of pocket expense if they have not yet received this vaccine. Advised may also receive vaccine at local pharmacy or Health Dept. Verbalized acceptance and understanding.  Screening Tests Health Maintenance  Topic Date Due   DTaP/Tdap/Td (1 - Tdap) Never done   COVID-19 Vaccine (2 - Janssen risk series) 01/26/2020   Zoster Vaccines- Shingrix (1 of 2) 02/08/2024 (Originally 11/10/1969)   Pneumonia Vaccine 18+ Years old (2 of 2 - PPSV23 or PCV20) 03/14/2024 (Originally 05/23/2020)   INFLUENZA VACCINE  03/31/2023   Medicare Annual Wellness (AWV)  01/19/2024   COLONOSCOPY (Pts 45-65yrs Insurance coverage will need to be confirmed)   10/22/2027   Hepatitis C Screening  Completed   HPV VACCINES  Aged Out    Health Maintenance  Health Maintenance Due  Topic Date Due   DTaP/Tdap/Td (1 - Tdap) Never done   COVID-19 Vaccine (2 - Janssen risk series) 01/26/2020    Colorectal cancer screening: Type of screening: Colonoscopy. Completed 10/21/20. Repeat every 7 years  Lung Cancer Screening: (Low Dose CT Chest recommended if Age 70-80 years, 30 pack-year currently smoking OR have quit w/in 15years.) does not qualify.   Lung Cancer Screening Referral: NO  Additional Screening:  Hepatitis C Screening: does qualify; Completed 05/25/19  Vision Screening: Recommended annual ophthalmology exams for early detection of glaucoma and other disorders of the eye. Is the patient up to date with their annual eye exam?  Yes  Who is the provider or what is the name of the office in which the patient attends annual eye exams? Dr Joseph Art  If pt is not established with a provider,  would they like to be referred to a provider to establish care? No .   Dental Screening: Recommended annual dental exams for proper oral hygiene  Community Resource Referral / Chronic Care Management: CRR required this visit?  No   CCM required this visit?  No      Plan:     I have personally reviewed and noted the following in the patient's chart:   Medical and social history Use of alcohol, tobacco or illicit drugs  Current medications and supplements including opioid prescriptions. Patient is not currently taking opioid prescriptions. Functional ability and status Nutritional status Physical activity Advanced directives List of other physicians Hospitalizations, surgeries, and ER visits in previous 12 months Vitals Screenings to include cognitive, depression, and falls Referrals and appointments  In addition, I have reviewed and discussed with patient certain preventive protocols, quality metrics, and best practice recommendations. A written  personalized care plan for preventive services as well as general preventive health recommendations were provided to patient.     Annabell Sabal, CMA   01/19/2023   Nurse Notes: None

## 2023-01-19 NOTE — Telephone Encounter (Signed)
I called patient to discuss his CT chest result.  He has a stable lymph node that is unchanged and likely benign.  He also has mild dilation of the ascending aorta of 4 cm recommend annual follow-up.    There is a 1 cm nonspecific prominent lymph node seen at the subcarinal space without any axillary lymphadenopathy.  Patient denies any B symptoms of weight loss, fever or night sweats.  Plan is to follow-up on the annual imaging for his pulmonary nodule and aortic dilation.

## 2023-01-28 ENCOUNTER — Other Ambulatory Visit: Payer: Self-pay | Admitting: Physician Assistant

## 2023-02-01 ENCOUNTER — Encounter: Payer: Self-pay | Admitting: *Deleted

## 2023-02-07 ENCOUNTER — Ambulatory Visit (INDEPENDENT_AMBULATORY_CARE_PROVIDER_SITE_OTHER): Payer: Medicare HMO | Admitting: Student

## 2023-02-07 VITALS — BP 111/74 | HR 67 | Temp 98.0°F | Ht 72.0 in | Wt 214.7 lb

## 2023-02-07 DIAGNOSIS — J321 Chronic frontal sinusitis: Secondary | ICD-10-CM

## 2023-02-07 DIAGNOSIS — I10 Essential (primary) hypertension: Secondary | ICD-10-CM

## 2023-02-07 MED ORDER — FLUTICASONE PROPIONATE 50 MCG/ACT NA SUSP
1.0000 | Freq: Two times a day (BID) | NASAL | 2 refills | Status: DC
Start: 2023-02-07 — End: 2024-05-07

## 2023-02-07 NOTE — Progress Notes (Signed)
   CC: acute visit for congestion  HPI:  Mr.Martyn Colleran is a 72 y.o. male with history listed below presenting to the [facility] for acute visit for congestion. Please see individualized problem based charting for full HPI.  Past Medical History:  Diagnosis Date   Abnormal EKG 05/29/2018   Sinus bradycardia and LAFB with TWI V4-V6, III and aVF   Benign essential HTN 05/29/2018   Cardiomyopathy (HCC)    a. dx by echo 07/2020.   Dilation of aorta (HCC)    Environmental allergies    Fracture, ribs    H/O: varicose veins    Hearing loss    Hyperlipidemia LDL goal <70    Multiple food allergies    Rash    Seasonal allergies    Sinusitis    Stroke [facility])    s/p tpa    Review of Systems:  Negative aside from that listed in individualized problem based charting.  Physical Exam:  Vitals:   02/07/23 1551  BP: 111/74  Pulse: 67  Temp: 98 F (36.7 C)  TempSrc: Oral  SpO2: 95%  Weight: 214 lb 11.2 oz (97.4 kg)  Height: 6' (1.829 m)   Physical Exam Constitutional:      Appearance: Normal appearance. He is not ill-appearing.  HENT:     Nose: Congestion present. No rhinorrhea.     Comments: Mild redness in bilateral nares. No polyps, swelling, or bleeding noted.    Mouth/Throat:     Mouth: Mucous membranes are moist.     Pharynx: Oropharynx is clear. No oropharyngeal exudate.  Eyes:     General: No scleral icterus.    Extraocular Movements: Extraocular movements intact.     Conjunctiva/sclera: Conjunctivae normal.  Cardiovascular:     Rate and Rhythm: Normal rate and regular rhythm.     Heart sounds: Normal heart sounds. No murmur heard.    No friction rub. No gallop.  Pulmonary:     Effort: Pulmonary effort is normal.     Breath sounds: Normal breath sounds. No wheezing, rhonchi or rales.  Abdominal:     General: Bowel sounds are normal. There is no distension.     Palpations: Abdomen is soft.     Tenderness: There is no abdominal tenderness.  Skin:    General: Skin is warm  and dry.  Neurological:     Mental Status: He is alert and oriented to person, place, and time. Mental status is at baseline.  Psychiatric:        Mood and Affect: Mood normal.        Behavior: Behavior normal.      Assessment & Plan:   See Encounters Tab for problem based charting.  Patient discussed with Dr. Mikey Bussing

## 2023-02-07 NOTE — Assessment & Plan Note (Addendum)
Acute visit today for complaints of congestion and intermittent sinus pain (maxillary and frontal) over the past month with acute worsening over the past couple of weeks. He historically has a hard time during allergy season and tends to have a lot of congestion during these seasons. Aside from congestion, he is also experiencing rhinorrhea typically clear but at times yellow in color. He does have a postnasal drip but no significant cough. Denies fevers, chills, or other systemic symptoms aside from generalized fatigue.   On exam, he does have some redness in bilateral nares but otherwise no polyps, swelling, or bleeding noted.   His symptoms do appear consistent with likely chronic sinusitis, likely allergic, with possible worsening over the past couple of weeks due to a viral etiology although he does not have any other systemic symptoms. He does take OTC antihistamines but has not tried intranasal glucocorticoids or nasal irrigation yet. Have advised him to use Neti pot and have prescribed intranasal fluticasone spray BID to help with symptom management.  Plan: -nasal irrigation -intranasal fluticasone 1 spray in each nare BID -f/u in 1 month

## 2023-02-07 NOTE — Patient Instructions (Signed)
Edward [name],  It was a pleasure seeing you in the clinic today.   I have sent a prescription for a nasal steroid spray which I think will help with your symptoms. Please use 1 spray in each nostril twice a day. I also recommend using a Neti pot to help irrigate your nose and help relieve your symptoms as well.  Please come back in 1 month for a follow up visit.  Please call our clinic at [phone] if you have any questions or concerns. The best time to call is Monday-Friday from 9am-4pm, but there is someone available 24/7 at the same number. If you need medication refills, please notify your pharmacy one week in advance and they will send [place] a request.   Thank you for letting us take part in your care. We look forward to seeing you next time!

## 2023-02-07 NOTE — Assessment & Plan Note (Signed)
BP is 111/74 in clinic today. On entresto 24-26mg  BID, spironolactone 12.5mg  daily, toprol-xl 25mg  daily, and farxiga 10mg  daily.  -continue current medications

## 2023-02-11 ENCOUNTER — Other Ambulatory Visit: Payer: Self-pay | Admitting: Internal Medicine

## 2023-02-12 NOTE — Progress Notes (Signed)
Internal Medicine Clinic Attending  Case discussed with the resident at the time of the visit.  We reviewed the resident's history and exam and pertinent patient test results.  I agree with the assessment, diagnosis, and plan of care documented in the resident's note.  

## 2023-02-14 NOTE — Progress Notes (Signed)
Internal Medicine Clinic Attending  Case and documentation reviewed.  I reviewed the AWV findings.  I agree with the assessment, diagnosis, and plan of care documented in the AWV note.    Rondy Krupinski, MD    

## 2023-02-25 DIAGNOSIS — H5203 Hypermetropia, bilateral: Secondary | ICD-10-CM | POA: Diagnosis not present

## 2023-03-25 ENCOUNTER — Other Ambulatory Visit: Payer: Self-pay | Admitting: Physician Assistant

## 2023-03-25 NOTE — Telephone Encounter (Signed)
Patient of Dr. Radford Pax. Please review for refill. Thank you!

## 2023-03-30 DIAGNOSIS — Z0100 Encounter for examination of eyes and vision without abnormal findings: Secondary | ICD-10-CM | POA: Diagnosis not present

## 2023-04-13 DIAGNOSIS — R8271 Bacteriuria: Secondary | ICD-10-CM | POA: Diagnosis not present

## 2023-04-13 DIAGNOSIS — N5201 Erectile dysfunction due to arterial insufficiency: Secondary | ICD-10-CM | POA: Diagnosis not present

## 2023-04-13 DIAGNOSIS — R3914 Feeling of incomplete bladder emptying: Secondary | ICD-10-CM | POA: Diagnosis not present

## 2023-06-22 ENCOUNTER — Other Ambulatory Visit: Payer: Self-pay | Admitting: Cardiology

## 2023-07-16 ENCOUNTER — Other Ambulatory Visit: Payer: Self-pay | Admitting: Cardiology

## 2023-08-22 ENCOUNTER — Ambulatory Visit (INDEPENDENT_AMBULATORY_CARE_PROVIDER_SITE_OTHER): Payer: Medicare HMO | Admitting: Internal Medicine

## 2023-08-22 VITALS — BP 124/89 | HR 89 | Temp 98.2°F | Ht 72.0 in | Wt 219.2 lb

## 2023-08-22 DIAGNOSIS — I998 Other disorder of circulatory system: Secondary | ICD-10-CM | POA: Diagnosis not present

## 2023-08-22 DIAGNOSIS — K219 Gastro-esophageal reflux disease without esophagitis: Secondary | ICD-10-CM | POA: Diagnosis not present

## 2023-08-22 DIAGNOSIS — E782 Mixed hyperlipidemia: Secondary | ICD-10-CM | POA: Diagnosis not present

## 2023-08-22 DIAGNOSIS — N400 Enlarged prostate without lower urinary tract symptoms: Secondary | ICD-10-CM | POA: Diagnosis not present

## 2023-08-22 DIAGNOSIS — I110 Hypertensive heart disease with heart failure: Secondary | ICD-10-CM

## 2023-08-22 DIAGNOSIS — I5022 Chronic systolic (congestive) heart failure: Secondary | ICD-10-CM

## 2023-08-22 DIAGNOSIS — I10 Essential (primary) hypertension: Secondary | ICD-10-CM | POA: Diagnosis not present

## 2023-08-22 MED ORDER — TAMSULOSIN HCL 0.4 MG PO CAPS: 0.4000 mg | ORAL_CAPSULE | Freq: Every day | ORAL | 3 refills | Status: DC

## 2023-08-22 MED ORDER — OMEPRAZOLE 20 MG PO CPDR: 20.0000 mg | DELAYED_RELEASE_CAPSULE | Freq: Every day | ORAL | 1 refills | Status: DC

## 2023-08-22 MED ORDER — MELOXICAM 15 MG PO TABS: 15.0000 mg | ORAL_TABLET | ORAL | 0 refills | Status: DC | PRN

## 2023-08-22 MED ORDER — FINASTERIDE 5 MG PO TABS: 5.0000 mg | ORAL_TABLET | Freq: Every day | ORAL | 3 refills | Status: DC

## 2023-08-22 MED ORDER — ROSUVASTATIN CALCIUM 10 MG PO TABS: 10.0000 mg | ORAL_TABLET | Freq: Every day | ORAL | 3 refills | Status: DC

## 2023-08-22 NOTE — Assessment & Plan Note (Signed)
The patient follows with [facility] for BPH. He takes finasteride 5 mg daily and flomax 0.4 mg daily and is requesting refills of these meds.

## 2023-08-22 NOTE — Progress Notes (Signed)
CC: routine follow up  HPI:  Edward [name] is a 72 y.o. male living with a history stated below and presents today for a routine follow up of his hypertension, ischemic vascular disease, and BPH. Please see problem based assessment and plan for additional details.  Past Medical History:  Diagnosis Date   Abnormal EKG 05/29/2018   Sinus bradycardia and LAFB with TWI V4-V6, III and aVF   Benign essential HTN 05/29/2018   Cardiomyopathy (HCC)    a. dx by echo 07/2020.   Dilation of aorta (HCC)    Environmental allergies    Fracture, ribs    H/O: varicose veins    Hearing loss    Hyperlipidemia LDL goal <70    Multiple food allergies    Rash    Seasonal allergies    Sinusitis    Stroke [facility])    s/p tpa    Current Outpatient Medications on File Prior to Visit  Medication Sig Dispense Refill   aspirin EC 81 MG tablet Take 81 mg by mouth daily. Swallow whole.     Cinnamon 500 MG capsule SMARTSIG:1 Capsule(s) By Mouth     Coenzyme Q10 (COQ-10) 100 MG CAPS SMARTSIG:1 Capsule(s) By Mouth     fluorouracil (EFUDEX) 5 % cream Apply topically as needed.     fluticasone (FLONASE) 50 MCG/ACT nasal spray Place 1 spray into both nostrils 2 (two) times daily. 15.8 mL 2   metoprolol succinate (TOPROL-XL) 25 MG 24 hr tablet Take 1 tablet (25 mg) by mouth daily. Appointment needed for continuation of refills-1st attempt 90 tablet 3   mupirocin ointment (BACTROBAN) 2 % Apply 1 application. topically as needed.     N-ACETYL CYSTEINE 600 MG TABS SMARTSIG:1 Tablet(s) By Mouth     RA CRANBERRY 500 MG CAPS SMARTSIG:1 Capsule(s) By Mouth     sacubitril-valsartan (ENTRESTO) 24-26 MG Take 1 tablet by mouth 2 (two) times daily. 180 tablet 3   sildenafil (VIAGRA) 100 MG tablet Take 1 tablet by mouth as needed.     spironolactone (ALDACTONE) 25 MG tablet TAKE 1/2 TABLET BY MOUTH DAILY 30 tablet 0   tacrolimus (PROTOPIC) 0.1 % ointment Apply 1 application. topically as needed.     tadalafil (CIALIS) 20 MG  tablet Take 20 mg by mouth daily as needed.     No current facility-administered medications on file prior to visit.    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  Tobacco Use   Smoking status: Former   Smokeless tobacco: Never   Tobacco comments:    quit 40+ years ago  Vaping Use   Vaping status: Never Used  Substance and Sexual Activity   Alcohol use: Yes    Comment: wine 3 x a week   Drug use:  Never   Sexual activity: Yes    Partners: Female  Other Topics Concern   Not on file  Social History Narrative   Not on file   Social Drivers of Health   Financial Resource Strain: Low Risk  (01/19/2023)   Overall Financial Resource Strain (CARDIA)    Difficulty of Paying Living Expenses: Not hard at all  Food Insecurity: No Food Insecurity (01/19/2023)   Hunger Vital Sign    Worried About Running Out of Food in the Last Year: Never true    Ran Out of Food in the Last Year: Never true  Transportation Needs: No Transportation Needs (06/17/2021)   PRAPARE - Administrator, Civil Service (Medical): No    Lack of Transportation (Non-Medical): No  Physical Activity: Sufficiently Active (01/19/2023)   Exercise Vital Sign    Days of Exercise per Week: 7 days    Minutes of Exercise per Session: 30 min  Stress: No Stress Concern Present (01/19/2023)   [employer] of Occupational Health - Occupational Stress Questionnaire    Feeling of Stress : Not at all  Social Connections:  Socially Integrated (01/19/2023)   Social Connection and Isolation Panel [NHANES]    Frequency of Communication with Friends and Family: More than three times a week    Frequency of Social Gatherings with Friends and Family: More than three times a week    Attends Religious Services: More than 4 times per year    Active Member of [employer] or Organizations: Yes    Attends Engineer, structural: More than 4 times per year    Marital Status: Married  Catering manager Violence: Not At Risk (01/19/2023)   Humiliation, Afraid, Rape, and Kick questionnaire    Fear of Current or Ex-Partner: No    Emotionally Abused: No    Physically Abused: No    Sexually Abused: No    Review of Systems: ROS negative except for what is noted on the assessment and plan.  Vitals:   08/22/23 1556  BP: 124/89  Pulse: 89  Temp: 98.2 F (36.8 C)  TempSrc: Oral  SpO2: 100%  Weight: 219 lb 3.2 oz (99.4 kg)  Height: 6' (1.829 m)    Physical Exam: Constitutional: well-appearing elderly male sitting in chair, in no acute distress Cardiovascular: regular rate and rhythm, no m/r/g Pulmonary/Chest: normal work of breathing on room air, lungs clear to auscultation bilaterally MSK: normal bulk and tone Neurological: alert & oriented x 3, no focal deficit Skin: warm and dry Psych: normal mood and behavior  Assessment & Plan:    Patient discussed with Dr. Oswaldo Done  Benign essential HTN Blood pressure is well controlled at 124/89 on metoprolol 25 mg daily and spironolactone 12.5 mg daily. He has not been taking entresto, as he was not able to afford this medication.   Plan: - Continue metoprolol and spironolactone - BMP today  Chronic systolic heart failure (HCC) EF 35-40% in April 2023. The patient is on metoprolol 25 mg daily and spiro 12.5 mg daily. He was not able to afford Entresto and does not remember why he is no longer taking farxiga. Will obtain an updated echo - if EF is still reduced, will try  to get patient some financial assistance for entresto, otherwise, will continue current therapy.   GERD (gastroesophageal reflux disease) Symptoms well controlled on omeprazole 20 mg daily.  BPH (benign prostatic hyperplasia) The patient follows with [facility] for BPH. He takes finasteride 5 mg daily and flomax  0.4 mg daily and is requesting refills of these meds.   Ischemic vascular disease The patient had a stroke in 2016 and has been taking aspirin 81 mg daily. He has not taken his crestor in a while, as he states that he has heard negative things about statins. When I asked him what side effects worried him the most, he was not really sure, but was still hesitant to take the medication.   His LDL was 90 in Jan 2023, but we discussed that with his history of a stroke, his LDL goal would be <70.   Plan: - Lipid profile today - Advised patient to restart crestor 10 mg daily   Cuong Moorman, D.O. [facility], PGY-3 Phone: [phone] Date 08/22/2023 Time 4:59 PM

## 2023-08-22 NOTE — Assessment & Plan Note (Signed)
EF 35-40% in April 2023. The patient is on metoprolol 25 mg daily and spiro 12.5 mg daily. He was not able to afford Entresto and does not remember why he is no longer taking farxiga. Will obtain an updated echo - if EF is still reduced, will try to get patient some financial assistance for entresto, otherwise, will continue current therapy.

## 2023-08-22 NOTE — Assessment & Plan Note (Addendum)
Blood pressure is well controlled at 124/89 on metoprolol 25 mg daily and spironolactone 12.5 mg daily. He has not been taking entresto, as he was not able to afford this medication.   Plan: - Continue metoprolol and spironolactone - BMP today

## 2023-08-22 NOTE — Assessment & Plan Note (Signed)
The patient had a stroke in 2016 and has been taking aspirin 81 mg daily. He has not taken his crestor in a while, as he states that he has heard negative things about statins. When I asked him what side effects worried him the most, he was not really sure, but was still hesitant to take the medication.   His LDL was 90 in Jan 2023, but we discussed that with his history of a stroke, his LDL goal would be <70.   Plan: - Lipid profile today - Advised patient to restart crestor 10 mg daily

## 2023-08-22 NOTE — Patient Instructions (Signed)
Thank you, [name] for allowing [place] to provide your care today. Today we discussed:  High blood pressure/heart disease Keep taking metoprolol once a day and 1/2 a tablet of spironolactone We are going to get an updated ultrasound of your heart -- if it is not pumping adequately we will try to restart entresto  We are rechecking your cholesterol today -- keep taking aspirin and I also recommend you restart Rosuvastatin 10 mg  (low dose) daily to prevent another stroke  I have refilled your finasteride, tamsulosin, and omeprazole  I have ordered the following labs for you:   Lab Orders         Lipid Profile         BMP8+Anion Gap      Tests ordered today:  Echocardiogram  Referrals ordered today:   Referral Orders  No referral(s) requested today     I have ordered the following medication/changed the following medications:   Stop the following medications: Medications Discontinued During This Encounter  Medication Reason   rosuvastatin (CRESTOR) 10 MG tablet Reorder   tamsulosin (FLOMAX) 0.4 MG CAPS capsule Reorder   omeprazole (PRILOSEC) 20 MG capsule Reorder     Start the following medications: Meds ordered this encounter  Medications   omeprazole (PRILOSEC) 20 MG capsule    Sig: Take 1 capsule (20 mg total) by mouth daily.    Dispense:  90 capsule    Refill:  1   rosuvastatin (CRESTOR) 10 MG tablet    Sig: Take 1 tablet (10 mg total) by mouth daily.    Dispense:  30 tablet    Refill:  3   finasteride (PROSCAR) 5 MG tablet    Sig: Take 1 tablet (5 mg total) by mouth daily.    Dispense:  30 tablet    Refill:  3   tamsulosin (FLOMAX) 0.4 MG CAPS capsule    Sig: Take 1 capsule (0.4 mg total) by mouth daily.    Dispense:  30 capsule    Refill:  3     Follow up: 3 months     Should you have any questions or concerns please call the internal medicine clinic at [phone].     Elza Rafter, D.O. [facility]

## 2023-08-22 NOTE — Assessment & Plan Note (Signed)
Symptoms well controlled on omeprazole 20 mg daily.

## 2023-08-23 ENCOUNTER — Other Ambulatory Visit: Payer: Self-pay | Admitting: Internal Medicine

## 2023-08-23 LAB — BMP8+ANION GAP
Anion Gap: 14 mmol/L (ref 10.0–18.0)
BUN/Creatinine Ratio: 11 (ref 10–24)
BUN: 12 mg/dL (ref 8–27)
CO2: 23 mmol/L (ref 20–29)
Calcium: 8.9 mg/dL (ref 8.6–10.2)
Chloride: 100 mmol/L (ref 96–106)
Creatinine, Ser: 1.11 mg/dL (ref 0.76–1.27)
Glucose: 97 mg/dL (ref 70–99)
Potassium: 4.5 mmol/L (ref 3.5–5.2)
Sodium: 137 mmol/L (ref 134–144)
eGFR: 71 mL/min/{1.73_m2} (ref >59)

## 2023-08-23 LAB — LIPID PANEL
Chol/HDL Ratio: 5.7 {ratio} — ABNORMAL HIGH (ref 0.0–5.0)
Cholesterol, Total: 227 mg/dL — ABNORMAL HIGH (ref 100–199)
HDL: 40 mg/dL (ref >39)
LDL Chol Calc (NIH): 166 mg/dL — ABNORMAL HIGH (ref 0–99)
Triglycerides: 115 mg/dL (ref 0–149)
VLDL Cholesterol Cal: 21 mg/dL (ref 5–40)

## 2023-08-25 NOTE — Progress Notes (Signed)
 Internal Medicine Clinic Attending  Case discussed with the resident physician at the time of the visit.  We reviewed the patient's history, exam, and pertinent patient test results.  I agree with the assessment, diagnosis, and plan of care documented in the resident's note.

## 2023-08-26 NOTE — Progress Notes (Signed)
BMP unremarkable. LDL elevated to 166 (goal is <70). Patient was hesitant to start statin during his office visit but has agreed to take crestor 10 mg daily - will likely need to increase this to high-intensity dose, but will recheck lipid profile again in 6-8 weeks. Patient will call to make appt.

## 2023-09-10 ENCOUNTER — Other Ambulatory Visit: Payer: Self-pay | Admitting: Cardiology

## 2023-09-23 ENCOUNTER — Other Ambulatory Visit: Payer: Self-pay | Admitting: Internal Medicine

## 2023-09-30 ENCOUNTER — Ambulatory Visit (INDEPENDENT_AMBULATORY_CARE_PROVIDER_SITE_OTHER): Payer: Medicare HMO

## 2023-09-30 DIAGNOSIS — I5022 Chronic systolic (congestive) heart failure: Secondary | ICD-10-CM

## 2023-09-30 LAB — ECHOCARDIOGRAM COMPLETE
AR max vel: 2.46 cm2
AV Area VTI: 2.48 cm2
AV Area mean vel: 2.5 cm2
AV Mean grad: 3 mm[Hg]
AV Peak grad: 6.3 mm[Hg]
AV Vena cont: 0.35 cm
Ao pk vel: 1.25 m/s
Area-P 1/2: 2.38 cm2
P 1/2 time: 554 ms
S' Lateral: 3.29 cm

## 2023-10-11 NOTE — Progress Notes (Signed)
EF mildly improved from 35-40% to 45-50%, LV still demonstrates global hypokinesis (unchanged). Will not make adjustments to GDMT at this time.

## 2023-10-18 DIAGNOSIS — N5201 Erectile dysfunction due to arterial insufficiency: Secondary | ICD-10-CM | POA: Diagnosis not present

## 2023-10-18 DIAGNOSIS — R8271 Bacteriuria: Secondary | ICD-10-CM | POA: Diagnosis not present

## 2023-10-18 DIAGNOSIS — R972 Elevated prostate specific antigen [PSA]: Secondary | ICD-10-CM | POA: Diagnosis not present

## 2023-10-18 DIAGNOSIS — R3914 Feeling of incomplete bladder emptying: Secondary | ICD-10-CM | POA: Diagnosis not present

## 2023-10-24 ENCOUNTER — Other Ambulatory Visit: Payer: Self-pay | Admitting: Cardiology

## 2023-10-28 ENCOUNTER — Other Ambulatory Visit: Payer: Self-pay | Admitting: Student

## 2023-11-02 DIAGNOSIS — L814 Other melanin hyperpigmentation: Secondary | ICD-10-CM | POA: Diagnosis not present

## 2023-11-02 DIAGNOSIS — Z85828 Personal history of other malignant neoplasm of skin: Secondary | ICD-10-CM | POA: Diagnosis not present

## 2023-11-02 DIAGNOSIS — L570 Actinic keratosis: Secondary | ICD-10-CM | POA: Diagnosis not present

## 2023-11-02 DIAGNOSIS — D1801 Hemangioma of skin and subcutaneous tissue: Secondary | ICD-10-CM | POA: Diagnosis not present

## 2023-11-02 DIAGNOSIS — L2089 Other atopic dermatitis: Secondary | ICD-10-CM | POA: Diagnosis not present

## 2023-11-02 DIAGNOSIS — L821 Other seborrheic keratosis: Secondary | ICD-10-CM | POA: Diagnosis not present

## 2023-11-07 ENCOUNTER — Telehealth: Payer: Self-pay | Admitting: Student

## 2023-11-07 NOTE — Telephone Encounter (Signed)
 CRM # [id number] Owner: Shon Hough F Status: Resolved Open  Priority: Routine Created on: 11/07/2023 04:13 PM By: Wenda Overland   Primary Information  Source  Edward [name] (Patient)   Subject  Edward [name] (Patient)   Topic  Referral - Request for Referral    Communication  Did the patient discuss referral with their provider in the last year? No    (If No - schedule appointment)    (If Yes - send message)        Appointment offered? No        Type of order/referral and detailed reason for visit: Audiologist for annual hearing test        Preference of office, provider, location: [facility]        If referral order, have you been seen by this specialty before? No    (If Yes, this issue or another issue? When? Where?        Can we respond through MyChart? No  Appt sch for  Name: Edward [name], Edward [name] MRN: [id number]  Date: 11/16/2023 Status: Sch  Time: 3:15 PM Length: 30  Visit Type: OPEN ESTABLISHED [726] Copay: $0.00  Provider: Rudene Christians, DO

## 2023-11-07 NOTE — Telephone Encounter (Deleted)
 Copied from CRM [id number]. Topic: Referral - Request for Referral >> Nov 07, 2023  4:13 PM Corin V wrote: Did the patient discuss referral with their provider in the last year? No (If No - schedule appointment) (If Yes - send message)  Appointment offered? No  Type of order/referral and detailed reason for visit: Audiologist for annual hearing test  Preference of office, provider, location: [facility]  If referral order, have you been seen by this specialty before? No (If Yes, this issue or another issue? When? Where?  Can we respond through MyChart? No  Spoke with the patient appt has been sch for   Name: Edward, [name] MRN: [id number]  Date: 11/16/2023 Status: Sch  Time: 3:15 PM Length: 30  Visit Type: OPEN ESTABLISHED [726] Copay: $0.00  Provider: Rudene Christians, DO

## 2023-11-16 ENCOUNTER — Ambulatory Visit: Admitting: Internal Medicine

## 2023-11-16 VITALS — BP 138/84 | HR 92 | Temp 97.7°F | Ht 72.0 in | Wt 218.3 lb

## 2023-11-16 DIAGNOSIS — E782 Mixed hyperlipidemia: Secondary | ICD-10-CM

## 2023-11-16 DIAGNOSIS — H9313 Tinnitus, bilateral: Secondary | ICD-10-CM | POA: Diagnosis not present

## 2023-11-16 DIAGNOSIS — I5022 Chronic systolic (congestive) heart failure: Secondary | ICD-10-CM | POA: Diagnosis not present

## 2023-11-16 DIAGNOSIS — H9193 Unspecified hearing loss, bilateral: Secondary | ICD-10-CM

## 2023-11-16 NOTE — Progress Notes (Unsigned)
 Subjective:  CC: hearing  HPI:  [name] is a 73 y.o. male with a past medical history of HFrEF (EF 35-40%), HTN, GERD, HLD  who presents today for tinnitus and follow-up on cholesterol  Please see problem based assessment and plan for additional details.  Past Medical History:  Diagnosis Date   Abnormal EKG 05/29/2018   Sinus bradycardia and LAFB with TWI V4-V6, III and aVF   Benign essential HTN 05/29/2018   Cardiomyopathy (HCC)    a. dx by echo 07/2020.   Dilation of aorta (HCC)    Environmental allergies    Fracture, ribs    H/O: varicose veins    Hearing loss    Hyperlipidemia LDL goal <70    Multiple food allergies    Rash    Seasonal allergies    Sinusitis    Stroke (HCC)    s/p tpa    MEDICATIONS:  Asa 81 mg Finasteride 5 mg Metoprolol succinate 25 mg Omeprazole 20 mg Rosuvastatin 10 mg Entresto 50 mg Spironolactone 25 mg Tamsulosin 0.4 mg  Family History  Problem Relation Age of Onset   CVA Mother    Heart failure Father        CHF   Brain cancer Brother        GBM   Colon polyps Neg Hx    Colon cancer Neg Hx    Esophageal cancer Neg Hx    Rectal cancer Neg Hx    Stomach cancer Neg Hx     Past Surgical History:  Procedure Laterality Date   COLONOSCOPY     INGUINAL HERNIA REPAIR Left    right hand surgery       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  Tobacco Use   Smoking status: Former   Smokeless tobacco: Never   Tobacco comments:    quit 40+ years ago  Vaping Use   Vaping status: Never Used  Substance and Sexual Activity   Alcohol use: Yes    Comment: wine 3 x a week   Drug use: Never   Sexual activity: Yes    Partners: Female  Other Topics Concern   Not on file  Social History  Narrative   Not on file   Social Drivers of Health   Financial Resource Strain: Low Risk  (01/19/2023)   Overall Financial Resource Strain (CARDIA)    Difficulty of Paying Living Expenses: Not hard at all  Food Insecurity: No Food Insecurity (01/19/2023)   Hunger Vital Sign    Worried About Running Out of Food in the Last Year: Never true    Ran Out of Food in the Last Year: Never true  Transportation Needs: No Transportation Needs (06/17/2021)   PRAPARE - Administrator, Civil Service (Medical): No    Lack of Transportation (Non-Medical): No  Physical Activity: Sufficiently Active (01/19/2023)   Exercise Vital Sign    Days of Exercise per Week: 7 days    Minutes of Exercise per Session: 30 min  Stress: No Stress Concern Present (01/19/2023)   [employer] of Occupational Health - Occupational Stress Questionnaire    Feeling of Stress : Not at all  Social Connections: Socially Integrated (  01/19/2023)   Social Connection and Isolation Panel [NHANES]    Frequency of Communication with Friends and Family: More than three times a week    Frequency of Social Gatherings with Friends and Family: More than three times a week    Attends Religious Services: More than 4 times per year    Active Member of [employer] or Organizations: Yes    Attends Engineer, structural: More than 4 times per year    Marital Status: Married  Catering manager Violence: Not At Risk (01/19/2023)   Humiliation, Afraid, Rape, and Kick questionnaire    Fear of Current or Ex-Partner: No    Emotionally Abused: No    Physically Abused: No    Sexually Abused: No    Review of Systems: ROS negative except for what is noted on the assessment and plan.  Objective:   Vitals:   11/16/23 1524  Weight: 218 lb 4.8 oz (99 kg)  Height: 6' (1.829 m)    Physical Exam: Constitutional: well-appearing *** sitting in ***, in no acute distress HENT: normocephalic atraumatic, mucous membranes moist Eyes:  conjunctiva non-erythematous Neck: supple Cardiovascular: regular rate and rhythm, no m/r/g Pulmonary/Chest: normal work of breathing on room air, lungs clear to auscultation bilaterally Abdominal: soft, non-tender, non-distended MSK: normal bulk and tone Neurological: alert & oriented x 3, 5/5 strength in bilateral upper and lower extremities, normal gait Skin: warm and dry Psych: ***     Assessment & Plan:  No problem-specific Assessment & Plan notes found for this encounter.    Patient {[id number]::"discussed with","seen with"} Dr. {[id number]::"IRJJOACZ","Y. Hoffman","Mullen","Narendra","Machen","Vincent","Guilloud","Lau"}   [employer], D.O. [facility]  PGY-3 Pager: [phone]  Phone: [phone] Date 11/16/2023  Time 3:26 PM

## 2023-11-16 NOTE — Patient Instructions (Addendum)
 Thank you, Mr.Reice Palmieri for allowing [place] to provide your care today.   Hearing problems I have referred you to the audiologist.   Cholesterol  I will call with results in next day or so. The goal is for LDL to be <70.  I am glad that your echocardiogram looked better in January. Please continue taking your blood pressure medications.   I have ordered the following labs for you:   Lab Orders         Lipid Profile      Referrals ordered today:    Referral Orders         Ambulatory referral to Audiology      I have ordered the following medication/changed the following medications:   Stop the following medications: There are no discontinued medications.   Start the following medications: No orders of the defined types were placed in this encounter.   We look forward to seeing you next time. Please call our clinic at [phone] if you have any questions or concerns. The best time to call is Monday-Friday from 9am-4pm, but there is someone available 24/7. If after hours or the weekend, call the main hospital number and ask for the Internal Medicine Resident On-Call. If you need medication refills, please notify your pharmacy one week in advance and they will send [place] a request.   Thank you for trusting me with your care. Wishing you the best!   Rudene Christians, DO [facility]

## 2023-11-17 LAB — LIPID PANEL
Chol/HDL Ratio: 3.7 ratio (ref 0.0–5.0)
Cholesterol, Total: 151 mg/dL (ref 100–199)
HDL: 41 mg/dL (ref >39)
LDL Chol Calc (NIH): 89 mg/dL (ref 0–99)
Triglycerides: 117 mg/dL (ref 0–149)
VLDL Cholesterol Cal: 21 mg/dL (ref 5–40)

## 2023-11-17 NOTE — Assessment & Plan Note (Signed)
 History of stroke, on statin for secondary prevention.  He restarted rosuvastatin in December.  He has been taking rosuvastatin 4 times a week as he had muscle weakness when he was taking it daily. P: Pete lipid panel with LDL above goal at 89 but improved from December at 166.  Have patient attempt to increase frequency of dosing.  If unable to tolerate additional doses would consider adding on Zetia

## 2023-11-17 NOTE — Assessment & Plan Note (Addendum)
 History of heart failure with reduced ejection fraction.  Echo in January improved with EF of 5 to 50%.  He continued to have global hypokinesis in his left ventricle. Remains unclear why he has heart failure.  He completed calcium score was 0 in 2022.  Medications include metoprolol and spironolactone.  He was prescribed Entresto in the past but he became frustrated with trying to apply for pharmaceutical assistance his medication was too expensive and he stopped taking it for more than a year.  His blood pressures are 22/91 initially and increased to 138/84 on repeat.  P: I encouraged him to get a blood pressure cuff and record blood pressures at home and bring values to next visit.  For now we will continue metoprolol and spironolactone at same dose.  Would consider adding on losartan if blood pressures are high at next office visit.  He has follow-up with cardiology in early May.

## 2023-11-21 ENCOUNTER — Telehealth: Payer: Self-pay | Admitting: Cardiology

## 2023-11-21 MED ORDER — SPIRONOLACTONE 25 MG PO TABS: 12.5000 mg | ORAL_TABLET | Freq: Every day | ORAL | 1 refills | Status: DC

## 2023-11-21 NOTE — Telephone Encounter (Signed)
 Pt's medication was sent to pt's pharmacy as requested. Confirmation received.

## 2023-11-21 NOTE — Telephone Encounter (Signed)
*  STAT* If patient is at the pharmacy, call can be transferred to refill team.   1. Which medications need to be refilled? (please list name of each medication and dose if known) spironolactone (ALDACTONE) 25 MG tablet    2. Would you like to learn more about the convenience, safety, & potential cost savings by using the [facility]?     3. Are you open to using the [facility] (Type [facility]. ).   4. Which pharmacy/location (including street and city if local pharmacy) is medication to be sent to? [id number] - [place], [place] - [address] AT [address] OF [facility] [address]    5. Do they need a 30 day or 90 day supply? 90

## 2023-11-25 NOTE — Progress Notes (Signed)
 Internal Medicine Clinic Attending  Case discussed with the resident at the time of the visit.  We reviewed the resident's history and exam and pertinent patient test results.  I agree with the assessment, diagnosis, and plan of care documented in the resident's note.

## 2023-11-30 ENCOUNTER — Ambulatory Visit: Attending: Internal Medicine | Admitting: Audiologist

## 2023-11-30 DIAGNOSIS — H9313 Tinnitus, bilateral: Secondary | ICD-10-CM | POA: Diagnosis not present

## 2023-11-30 DIAGNOSIS — H903 Sensorineural hearing loss, bilateral: Secondary | ICD-10-CM | POA: Diagnosis not present

## 2023-11-30 NOTE — Procedures (Signed)
  Outpatient Audiology and [facility] [address] [place], [place]  [address] [phone]  AUDIOLOGICAL  EVALUATION  NAME: Edward [name]     DOB:   Jul 22, 1951      MRN: [id number]                                                                                     DATE: 11/30/2023     REFERENT: Philomena Doheny, MD STATUS: Outpatient DIAGNOSIS: H90.3 Sensorineural hearing loss, bilateral, H93  History: [name] was seen for an audiological evaluation due to increased difficulty hearing. He reports that his wife also notices that he is having difficulty hearing. He has longstanding, non-bothersome ringing tinnitus, bilaterally. Sometimes the tinnitus sounds like cicadas. Job has a history of occupational noise exposure.   [name] denies current ear pain, pressure, dizziness, and fullness, however he has had recent difficulty with allergies. Sometimes he thinks that there is a clear fluid coming out of his ear when he sleeps on his side.  Evaluation:  Otoscopy showed a clear view of the tympanic membranes, bilaterally Tympanometry results were consistent with normal Type A tympanograms, bilaterally. Audiometric testing was completed using Conventional Audiometry techniques under headphones. Test results are consistent with a mild sloping to moderately severe high frequency sensorineural hearing loss at 2000Hz  and above bilaterally. Speech Recognition Thresholds were obtained at 25 dB HL in the right ear and at 20  dB HL in the left ear. Word Recognition Testing was completed at  40dB SL and [name] scored 84% correct in the left ear and 88% correct in the right ear.    Results:  The test results were reviewed with [name]. An audiogram and list of local hearing aid offices was provided to [name].    Recommendations: Hearing aids recommended for both ears. Patient given list of local hearing aid providers.  Annual audiometric testing recommended to monitor hearing loss for  progression.     40 minutes spent testing and counseling on results.   If you have any questions please feel free to contact me at [phone].  Hanley Ben, Au.D., CCC-A  Audiologist   11/30/2023  3:12 PM  Cc: Philomena Doheny, MD

## 2023-12-04 ENCOUNTER — Other Ambulatory Visit: Payer: Self-pay | Admitting: Student

## 2023-12-15 ENCOUNTER — Ambulatory Visit: Admitting: Audiology

## 2023-12-17 ENCOUNTER — Other Ambulatory Visit: Payer: Self-pay | Admitting: Internal Medicine

## 2024-01-02 ENCOUNTER — Encounter: Payer: Self-pay | Admitting: Cardiology

## 2024-01-02 ENCOUNTER — Ambulatory Visit: Payer: Medicare HMO | Attending: Cardiology | Admitting: Cardiology

## 2024-01-02 VITALS — BP 108/74 | HR 70 | Ht 73.0 in | Wt 218.6 lb

## 2024-01-02 DIAGNOSIS — R9431 Abnormal electrocardiogram [ECG] [EKG]: Secondary | ICD-10-CM

## 2024-01-02 DIAGNOSIS — I77810 Thoracic aortic ectasia: Secondary | ICD-10-CM

## 2024-01-02 DIAGNOSIS — I10 Essential (primary) hypertension: Secondary | ICD-10-CM

## 2024-01-02 DIAGNOSIS — E7800 Pure hypercholesterolemia, unspecified: Secondary | ICD-10-CM | POA: Diagnosis not present

## 2024-01-02 NOTE — Addendum Note (Signed)
 Addended by: Tawna Alwin N on: 01/02/2024 03:48 PM   Modules accepted: Orders

## 2024-01-02 NOTE — Patient Instructions (Signed)
 Medication Instructions:  Your physician recommends that you continue on your current medications as directed. Please refer to the Current Medication list given to you today.  *If you need a refill on your cardiac medications before your next appointment, please call your pharmacy*  Lab Work: Fasting lipid panel, ALT in 6 weeks If you have labs (blood work) drawn today and your tests are completely normal, you will receive your results only by: MyChart Message (if you have MyChart) OR A paper copy in the mail If you have any lab test that is abnormal or we need to change your treatment, we will call you to review the results.  Testing/Procedures: Echo Your physician has requested that you have an echocardiogram. Echocardiography is a painless test that uses sound waves to create images of your heart. It provides your doctor with information about the size and shape of your heart and how well your heart's chambers and valves are working. This procedure takes approximately one hour. There are no restrictions for this procedure. Please do NOT wear cologne, perfume, aftershave, or lotions (deodorant is allowed). Please arrive 15 minutes prior to your appointment time.  Please note: We ask at that you not bring children with you during ultrasound (echo/ vascular) testing. Due to room size and safety concerns, children are not allowed in the ultrasound rooms during exams. Our front office staff cannot provide observation of children in our lobby area while testing is being conducted. An adult accompanying a patient to their appointment will only be allowed in the ultrasound room at the discretion of the ultrasound technician under special circumstances. We apologize for any inconvenience.   Follow-Up: At [facility], you and your health needs are our priority.  As part of our continuing mission to provide you with exceptional heart care, our providers are all part of one team.  This team  includes your primary Cardiologist (physician) and Advanced Practice Providers or APPs (Physician Assistants and Nurse Practitioners) who all work together to provide you with the care you need, when you need it.  Your next appointment:   1 year  Provider:   Micael Adas, MD  We recommend signing up for the patient portal called "MyChart".  Sign up information is provided on this After Visit Summary.  MyChart is used to connect with patients for Virtual Visits (Telemedicine).  Patients are able to view lab/test results, encounter notes, upcoming appointments, etc.  Non-urgent messages can be sent to your provider as well.   To learn more about what you can do with MyChart, go to [email or web address].   Other Instructions

## 2024-01-02 NOTE — Progress Notes (Signed)
 Cardiology Office Note:    Date:  01/02/2024   ID:  Edward [name], DOB 01-04-1951, MRN [id number]  PCP:  Arellano Zameza, Cynthea Drier, MD  Cardiologist:  None    Referring MD: Arellano Zameza, Prisci*   Chief Complaint  Patient presents with   Hypertension   Hyperlipidemia    History of Present Illness:    Edward [name] is a 73 y.o. male with a hx of varicose veins, hypertension, seasonal allergies who was initially referred for evaluation of abnormal EKG and sinus bradycardia 58 bpm with left anterior fascicular block and nonspecific T wave and abnormality. He has a hx of CVA after taking a long plane ride to [place]  and then flew back and afterwards suffered an acute CVA. Work-up with echo showed an enlarged left side of the heart but nothing further was ever done about it.  EKG in review showed T wave inversions in V4 through V6 with flattening of the T wave in V3 and inverted T waves in 3 and aVF.  Repeat 2D echo 07/2018 showed low normal LVF with EF 50-55%, mild LVH and G1DD.  The ascending aorta was mildly enlarged at 42mm. There was mild MR.  Nuclear stress test showed no ischemia.   He is here today for followup and is doing well.  He denies any chest pain or pressure, SOB, DOE, PND, orthopnea, LE edema, dizziness, palpitations or syncope. He is compliant with his meds and is tolerating meds with no SE.    Past Medical History:  Diagnosis Date   Abnormal EKG 05/29/2018   Sinus bradycardia and LAFB with TWI V4-V6, III and aVF   Benign essential HTN 05/29/2018   Cardiomyopathy (HCC)    a. dx by echo 07/2020.   Dilation of aorta (HCC)    Environmental allergies    Fracture, ribs    H/O: varicose veins    Hearing loss    Hyperlipidemia LDL goal <70    Multiple food allergies    Rash    Seasonal allergies    Sinusitis    Stroke [facility])    s/p tpa    Past Surgical History:  Procedure Laterality Date   COLONOSCOPY     INGUINAL HERNIA REPAIR Left    right hand surgery       Current Medications: Current Meds  Medication Sig   aspirin EC 81 MG tablet Take 81 mg by mouth daily. Swallow whole.   Cinnamon 500 MG capsule SMARTSIG:1 Capsule(s) By Mouth   Coenzyme Q10 (COQ-10) 100 MG CAPS SMARTSIG:1 Capsule(s) By Mouth   finasteride  (PROSCAR ) 5 MG tablet Take 1 tablet (5 mg total) by mouth daily.   fluorouracil (EFUDEX) 5 % cream Apply topically as needed.   fluticasone  (FLONASE ) 50 MCG/ACT nasal spray Place 1 spray into both nostrils 2 (two) times daily.   meloxicam  (MOBIC ) 15 MG tablet TAKE 1 TABLET (15 MG TOTAL) DAILY AS NEEDED FOR PAIN (KNEE PAIN - DO NOT TAKE EVERYDAY).   metoprolol  succinate (TOPROL -XL) 25 MG 24 hr tablet Take 1 tablet (25 mg) by mouth daily. Appointment needed for continuation of refills-1st attempt   mupirocin ointment (BACTROBAN) 2 % Apply 1 application. topically as needed.   N-ACETYL CYSTEINE 600 MG TABS SMARTSIG:1 Tablet(s) By Mouth   omeprazole  (PRILOSEC) 20 MG capsule Take 1 capsule (20 mg total) by mouth daily.   RA CRANBERRY 500 MG CAPS SMARTSIG:1 Capsule(s) By Mouth   rosuvastatin  (CRESTOR ) 10 MG tablet TAKE 1 TABLET BY MOUTH EVERY DAY  sildenafil (VIAGRA) 100 MG tablet Take 1 tablet by mouth as needed.   spironolactone  (ALDACTONE ) 25 MG tablet Take 0.5 tablets (12.5 mg total) by mouth daily.   tacrolimus (PROTOPIC) 0.1 % ointment Apply 1 application. topically as needed.   tadalafil (CIALIS) 20 MG tablet Take 20 mg by mouth daily as needed.   tamsulosin  (FLOMAX ) 0.4 MG CAPS capsule Take 1 capsule (0.4 mg total) by mouth daily.     Allergies:   Patient has no known allergies.   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  Tobacco Use   Smoking status: Former   Smokeless tobacco: Never    Tobacco comments:    quit 40+ years ago  Vaping Use   Vaping status: Never Used  Substance and Sexual Activity   Alcohol use: Yes    Comment: wine 3 x a week   Drug use: Never   Sexual activity: Yes    Partners: Female  Other Topics Concern   Not on file  Social History Narrative   Not on file   Social Drivers of Health   Financial Resource Strain: Low Risk  (01/19/2023)   Overall Financial Resource Strain (CARDIA)    Difficulty of Paying Living Expenses: Not hard at all  Food Insecurity: No Food Insecurity (01/19/2023)   Hunger Vital Sign    Worried About Running Out of Food in the Last Year: Never true    Ran Out of Food in the Last Year: Never true  Transportation Needs: No Transportation Needs (06/17/2021)   PRAPARE - Administrator, Civil Service (Medical): No    Lack of Transportation (Non-Medical): No  Physical Activity: Sufficiently Active (01/19/2023)   Exercise Vital Sign    Days of Exercise per Week: 7 days    Minutes of Exercise per Session: 30 min  Stress: No Stress Concern Present (01/19/2023)   [employer] of Occupational Health - Occupational Stress Questionnaire    Feeling of Stress : Not at all  Social Connections: Socially Integrated (01/19/2023)   Social Connection and Isolation Panel [NHANES]    Frequency of Communication with Friends and Family: More than three times a week    Frequency of Social Gatherings with Friends and Family: More than three times a week    Attends Religious Services: More than 4 times per year    Active Member of [employer] or Organizations: Yes    Attends Engineer, structural: More than 4 times per year    Marital Status: Married     Family History: The patient's family history includes Brain cancer in his brother; CVA in his mother; Heart failure in his father. There is no history of Colon polyps, Colon cancer, Esophageal cancer, Rectal cancer, or Stomach cancer.  ROS:   Please see the history of present  illness.    ROS  All other systems reviewed and negative.   EKGs/Labs/Other Studies Reviewed:    The following studies were reviewed today: 2D echo and stress test EKG Interpretation Date/Time:  Monday Jan 02 2024 15:31:16 EDT Ventricular Rate:  70 PR Interval:  204 QRS Duration:  102 QT Interval:  416 QTC Calculation: 449 R Axis:   -50  Text Interpretation: Normal  sinus rhythm Left axis deviation Incomplete right bundle branch block Nonspecific T wave abnormality No previous ECGs available Confirmed by Gaylyn Keas [id number]) on 01/02/2024 3:40:58 PM     Recent Labs: 08/22/2023: BUN 12; Creatinine, Ser 1.11; Potassium 4.5; Sodium 137   Recent Lipid Panel    Component Value Date/Time   CHOL 151 11/16/2023 1602   TRIG 117 11/16/2023 1602   HDL 41 11/16/2023 1602   CHOLHDL 3.7 11/16/2023 1602   LDLCALC 89 11/16/2023 1602    Physical Exam:    VS:  BP 108/74   Pulse 70   Ht 6\' 1"  (1.854 m)   Wt 218 lb 9.6 oz (99.2 kg)   SpO2 97%   BMI 28.84 kg/m     Wt Readings from Last 3 Encounters:  01/02/24 218 lb 9.6 oz (99.2 kg)  11/16/23 218 lb 4.8 oz (99 kg)  08/22/23 219 lb 3.2 oz (99.4 kg)    GEN: Well nourished, well developed in no acute distress HEENT: Normal NECK: No JVD; No carotid bruits LYMPHATICS: No lymphadenopathy CARDIAC:RRR, no murmurs, rubs, gallops RESPIRATORY:  Clear to auscultation without rales, wheezing or rhonchi  ABDOMEN: Soft, non-tender, non-distended MUSCULOSKELETAL:  trace edema; No deformity  SKIN: Warm and dry NEUROLOGIC:  Alert and oriented x 3 PSYCHIATRIC:  Normal affect  ASSESSMENT:    1. Abnormal EKG   2. Benign essential HTN   3. Ascending aorta dilation (HCC)   4. Elevated LDL cholesterol level     PLAN:    In order of problems listed above:  1.  Abnormal EKG -nuclear stress test 2019 with no ischemia -Coronary CTA 10/01/2020 showed a coronary calcium  score of 0 with no CAD -denies any CP or SOB  2.  HTN -BP controlled on  today -Continue spironolactone  12.5 mg daily, Toprol  XL 25 mg daily with as needed refills  3.  Mild ascending aortic aneurysm -ascending aorta measured 42mm by echo 2019 -Chest CT 12/21/2022 showed 4 cm ascending aorta -Check 2D echo to reassess  4.  HLD -LDL in Sept 2019 was 114 -with aortic aneurysm would like LDL < 70.   -I have personally reviewed and interpreted outside labs performed by patient's PCP which showed LDL 89, HDL 41 on 11/16/2023 and ALT 26 on 12/23/2023>>this was on Crestor  4 times weekly and his PCP increased it to 5 times weekly -repeat FLP and ALT in 6 weeks  Followup with me in 1 year  Medication Adjustments/Labs and Tests Ordered: Current medicines are reviewed at length with the patient today.  Concerns regarding medicines are outlined above.  Orders Placed This Encounter  Procedures   EKG 12-Lead   No orders of the defined types were placed in this encounter.   Signed, Gaylyn Keas, MD  01/02/2024 3:41 PM    [facility]

## 2024-01-04 ENCOUNTER — Other Ambulatory Visit: Payer: Self-pay | Admitting: Student

## 2024-01-08 ENCOUNTER — Other Ambulatory Visit: Payer: Self-pay | Admitting: Internal Medicine

## 2024-01-17 DIAGNOSIS — E7800 Pure hypercholesterolemia, unspecified: Secondary | ICD-10-CM | POA: Diagnosis not present

## 2024-01-18 LAB — LIPID PANEL
Chol/HDL Ratio: 3.7 ratio (ref 0.0–5.0)
Cholesterol, Total: 151 mg/dL (ref 100–199)
HDL: 41 mg/dL (ref >39)
LDL Chol Calc (NIH): 92 mg/dL (ref 0–99)
Triglycerides: 99 mg/dL (ref 0–149)
VLDL Cholesterol Cal: 18 mg/dL (ref 5–40)

## 2024-01-18 LAB — ALT: ALT: 25 IU/L (ref 0–44)

## 2024-01-22 ENCOUNTER — Other Ambulatory Visit: Payer: Self-pay | Admitting: Internal Medicine

## 2024-01-23 ENCOUNTER — Other Ambulatory Visit: Payer: Self-pay | Admitting: Physician Assistant

## 2024-01-24 NOTE — Telephone Encounter (Signed)
 Medication sent to pharmacy

## 2024-01-26 ENCOUNTER — Other Ambulatory Visit: Payer: Self-pay | Admitting: Internal Medicine

## 2024-01-26 ENCOUNTER — Encounter: Payer: Self-pay | Admitting: Cardiology

## 2024-01-27 MED ORDER — SPIRONOLACTONE 25 MG PO TABS
12.5000 mg | ORAL_TABLET | Freq: Every day | ORAL | 3 refills | Status: DC
Start: 2024-01-27 — End: 2024-05-30

## 2024-02-06 ENCOUNTER — Ambulatory Visit (HOSPITAL_COMMUNITY)

## 2024-02-06 ENCOUNTER — Other Ambulatory Visit: Payer: Self-pay

## 2024-02-06 ENCOUNTER — Telehealth (HOSPITAL_COMMUNITY): Payer: Self-pay | Admitting: Radiology

## 2024-02-06 DIAGNOSIS — I77810 Thoracic aortic ectasia: Secondary | ICD-10-CM

## 2024-02-06 NOTE — Progress Notes (Signed)
 Repeat 1 yr echo for dilatation aorta order placed

## 2024-02-06 NOTE — Telephone Encounter (Signed)
 Call to patient to see if he had canceled his limited echo due to illness. Patient states he had an echo in February and did not think he needed another one. Epic shows his previous echo was reviewed by Dr. Micael Adas, advised that certain cardiac conditions require more frequent surveillance, patient verbalizes understanding and agrees to rescheduled. Forwarded to scheduling.

## 2024-02-06 NOTE — Telephone Encounter (Signed)
 Patient would like someone to call and explain if echocardiogram is needed. He has cancelled appointment for tomorrow.

## 2024-02-14 ENCOUNTER — Other Ambulatory Visit: Payer: Self-pay | Admitting: Student

## 2024-02-22 ENCOUNTER — Encounter

## 2024-04-16 ENCOUNTER — Other Ambulatory Visit: Payer: Self-pay

## 2024-04-16 NOTE — Telephone Encounter (Signed)
 Prescription Request  04/16/2024  LOV: 03/19/225   What is the name of the medication or equipment?  meloxicam  (MOBIC ) 15 MG tablet   Have you contacted your pharmacy to request a refill? Yes   Which pharmacy would you like this sent to?  [id number] [place], [place] - [address] AT [address] OF [facility] [address] [place] [address] Phone: [phone] Fax: [phone]    Patient notified that their request is being sent to the clinical staff for review and that they should receive a response within 2 business days.   Please advise at [facility] [phone]

## 2024-04-19 MED ORDER — MELOXICAM 15 MG PO TABS: 15.0000 mg | ORAL_TABLET | Freq: Every day | ORAL | 0 refills | Status: DC | PRN

## 2024-04-19 NOTE — Telephone Encounter (Signed)
 Copied from CRM #[id number]. Topic: Clinical - Prescription Issue >> Apr 19, 2024  8:21 AM Susanna ORN wrote: Reason for CRM: Patient called in stating that he stopped by the clinic on Monday to get a refill taken care of meloxicam  (MOBIC ) 15 MG tablet. [facility]. A refill request has already been placed for this medication on 04/16/24. Patient states he's completely out of medication and needs it.

## 2024-04-24 DIAGNOSIS — R3914 Feeling of incomplete bladder emptying: Secondary | ICD-10-CM | POA: Diagnosis not present

## 2024-04-24 DIAGNOSIS — R972 Elevated prostate specific antigen [PSA]: Secondary | ICD-10-CM | POA: Diagnosis not present

## 2024-05-05 ENCOUNTER — Other Ambulatory Visit: Payer: Self-pay

## 2024-05-05 ENCOUNTER — Emergency Department (HOSPITAL_BASED_OUTPATIENT_CLINIC_OR_DEPARTMENT_OTHER): Admitting: Radiology

## 2024-05-05 DIAGNOSIS — E222 Syndrome of inappropriate secretion of antidiuretic hormone: Secondary | ICD-10-CM | POA: Diagnosis not present

## 2024-05-05 DIAGNOSIS — R7309 Other abnormal glucose: Secondary | ICD-10-CM | POA: Insufficient documentation

## 2024-05-05 DIAGNOSIS — M7918 Myalgia, other site: Secondary | ICD-10-CM | POA: Insufficient documentation

## 2024-05-05 DIAGNOSIS — Z7982 Long term (current) use of aspirin: Secondary | ICD-10-CM | POA: Insufficient documentation

## 2024-05-05 DIAGNOSIS — Z79899 Other long term (current) drug therapy: Secondary | ICD-10-CM | POA: Insufficient documentation

## 2024-05-05 DIAGNOSIS — J101 Influenza due to other identified influenza virus with other respiratory manifestations: Secondary | ICD-10-CM | POA: Diagnosis not present

## 2024-05-05 DIAGNOSIS — Z8673 Personal history of transient ischemic attack (TIA), and cerebral infarction without residual deficits: Secondary | ICD-10-CM | POA: Insufficient documentation

## 2024-05-05 DIAGNOSIS — R0602 Shortness of breath: Secondary | ICD-10-CM | POA: Insufficient documentation

## 2024-05-05 DIAGNOSIS — I10 Essential (primary) hypertension: Secondary | ICD-10-CM | POA: Insufficient documentation

## 2024-05-05 DIAGNOSIS — M791 Myalgia, unspecified site: Secondary | ICD-10-CM | POA: Diagnosis not present

## 2024-05-05 DIAGNOSIS — E871 Hypo-osmolality and hyponatremia: Secondary | ICD-10-CM | POA: Diagnosis not present

## 2024-05-05 DIAGNOSIS — R059 Cough, unspecified: Secondary | ICD-10-CM | POA: Insufficient documentation

## 2024-05-05 LAB — BASIC METABOLIC PANEL WITH GFR
Anion gap: 13 (ref 5–15)
BUN: 10 mg/dL (ref 8–23)
CO2: 23 mmol/L (ref 22–32)
Calcium: 9.2 mg/dL (ref 8.9–10.3)
Chloride: 90 mmol/L — ABNORMAL LOW (ref 98–111)
Creatinine, Ser: 1.01 mg/dL (ref 0.61–1.24)
GFR, Estimated: >60 mL/min (ref >60)
Glucose, Bld: 117 mg/dL — ABNORMAL HIGH (ref 70–99)
Potassium: 4.5 mmol/L (ref 3.5–5.1)
Sodium: 125 mmol/L — ABNORMAL LOW (ref 135–145)

## 2024-05-05 LAB — RESP PANEL BY RT-PCR (RSV, FLU A&B, COVID)  RVPGX2
Influenza A by PCR: NEGATIVE
Influenza B by PCR: NEGATIVE
Resp Syncytial Virus by PCR: NEGATIVE
SARS Coronavirus 2 by RT PCR: NEGATIVE

## 2024-05-05 LAB — CBC
HCT: 38.5 % — ABNORMAL LOW (ref 39.0–52.0)
Hemoglobin: 13.8 g/dL (ref 13.0–17.0)
MCH: 31.6 pg (ref 26.0–34.0)
MCHC: 35.8 g/dL (ref 30.0–36.0)
MCV: 88.1 fL (ref 80.0–100.0)
Platelets: 164 K/uL (ref 150–400)
RBC: 4.37 MIL/uL (ref 4.22–5.81)
RDW: 12 % (ref 11.5–15.5)
WBC: 5.7 K/uL (ref 4.0–10.5)
nRBC: 0 % (ref 0.0–0.2)

## 2024-05-05 LAB — TROPONIN T, HIGH SENSITIVITY: Troponin T High Sensitivity: <15 ng/L (ref 0–19)

## 2024-05-05 NOTE — ED Triage Notes (Signed)
 Pt POV reporting congestion, coughing, body aches, concerned for covid. Endorsing SOB and chest pressure.

## 2024-05-06 ENCOUNTER — Emergency Department (HOSPITAL_BASED_OUTPATIENT_CLINIC_OR_DEPARTMENT_OTHER)
Admission: EM | Admit: 2024-05-06 | Discharge: 2024-05-06 | Disposition: A | Discharge status: Home / Self Care | Source: Home / Self Care | Attending: Emergency Medicine | Admitting: Emergency Medicine

## 2024-05-06 DIAGNOSIS — E871 Hypo-osmolality and hyponatremia: Secondary | ICD-10-CM

## 2024-05-06 DIAGNOSIS — R739 Hyperglycemia, unspecified: Secondary | ICD-10-CM

## 2024-05-06 DIAGNOSIS — J111 Influenza due to unidentified influenza virus with other respiratory manifestations: Secondary | ICD-10-CM

## 2024-05-06 DIAGNOSIS — S2232XA Fracture of one rib, left side, initial encounter for closed fracture: Secondary | ICD-10-CM | POA: Diagnosis not present

## 2024-05-06 DIAGNOSIS — M791 Myalgia, unspecified site: Secondary | ICD-10-CM

## 2024-05-06 DIAGNOSIS — R0789 Other chest pain: Secondary | ICD-10-CM | POA: Diagnosis not present

## 2024-05-06 MED ORDER — DEXAMETHASONE 4 MG PO TABS
10.0000 mg | ORAL_TABLET | Freq: Once | ORAL | Status: AC
  Administered 2024-05-06: 10 mg via ORAL
  Filled 2024-05-06: qty 3

## 2024-05-06 MED ORDER — IBUPROFEN 400 MG PO TABS
400.0000 mg | ORAL_TABLET | Freq: Once | ORAL | Status: AC
  Administered 2024-05-06: 400 mg via ORAL
  Filled 2024-05-06: qty 1

## 2024-05-06 MED ORDER — ACETAMINOPHEN 325 MG PO TABS
650.0000 mg | ORAL_TABLET | Freq: Once | ORAL | Status: AC
  Administered 2024-05-06: 650 mg via ORAL
  Filled 2024-05-06: qty 2

## 2024-05-06 MED ORDER — ALBUTEROL SULFATE HFA 108 (90 BASE) MCG/ACT IN AERS
2.0000 | INHALATION_SPRAY | RESPIRATORY_TRACT | Status: DC | PRN
  Administered 2024-05-06: 2 via RESPIRATORY_TRACT
  Filled 2024-05-06: qty 6.7

## 2024-05-06 MED ORDER — ALBUTEROL SULFATE HFA 108 (90 BASE) MCG/ACT IN AERS: 2.0000 | INHALATION_SPRAY | RESPIRATORY_TRACT | Status: DC | PRN

## 2024-05-06 MED ORDER — IPRATROPIUM-ALBUTEROL 0.5-2.5 (3) MG/3ML IN SOLN
3.0000 mL | Freq: Once | RESPIRATORY_TRACT | Status: AC
  Administered 2024-05-06: 3 mL via RESPIRATORY_TRACT
  Filled 2024-05-06: qty 3

## 2024-05-06 NOTE — ED Provider Notes (Signed)
 [place] EMERGENCY DEPARTMENT AT [facility] Provider Note   CSN: [id number] Arrival date & time: 05/05/24  2236     Patient presents with: URI and Shortness of Breath   Edward [name] is a 73 y.o. male.   The history is provided by the patient.  URI Shortness of Breath  He has history of hypertension, hyperlipidemia, cardiomyopathy, stroke and comes in with 2-day history of myalgias, nonproductive cough.  He denies fever or chills but has had sweats.  He denies nausea, vomiting, diarrhea.  He was exposed to someone who had COVID and was concerned that he had come down with COVID.  He has taken ibuprofen  and naproxen for his aching without relief.  He has also used a nighttime cough and cold medication without relief.    Prior to Admission medications   Medication Sig Start Date End Date Taking? Authorizing Provider  tamsulosin  (FLOMAX ) 0.4 MG CAPS capsule TAKE 1 CAPSULE BY MOUTH EVERY DAY 01/24/24   Arellano Zameza, Priscila, MD  aspirin  EC 81 MG tablet Take 81 mg by mouth daily. Swallow whole.    [provider]  Cinnamon 500 MG capsule SMARTSIG:1 Capsule(s) By Mouth 11/02/21   [provider]  Coenzyme Q10 (COQ-10) 100 MG CAPS SMARTSIG:1 Capsule(s) By Mouth 11/02/21   [provider]  finasteride  (PROSCAR ) 5 MG tablet TAKE 1 TABLET (5 MG TOTAL) BY MOUTH DAILY. 01/09/24 01/08/25  Arellano Zameza, Priscila, MD  fluorouracil (EFUDEX) 5 % cream Apply topically as needed. 06/25/20   [provider]  fluticasone  (FLONASE ) 50 MCG/ACT nasal spray Place 1 spray into both nostrils 2 (two) times daily. 02/07/23 02/07/24  Jinwala, Sagar H, MD  meloxicam  (MOBIC ) 15 MG tablet Take 1 tablet (15 mg total) by mouth daily as needed for pain. 04/19/24   Syeda, Raeeha, DO  metoprolol  succinate (TOPROL -XL) 25 MG 24 hr tablet TAKE 1 TABLET (25 MG) BY MOUTH DAILY. APPOINTMENT NEEDED FOR CONTINUATION OF REFILLS-1ST ATTEMPT 01/24/24   Shlomo Wilbert SAUNDERS, MD  mupirocin ointment  (BACTROBAN) 2 % Apply 1 application. topically as needed. 06/25/20   [provider]  N-ACETYL CYSTEINE 600 MG TABS SMARTSIG:1 Tablet(s) By Mouth 11/02/21   [provider]  omeprazole  (PRILOSEC) 20 MG capsule TAKE 1 CAPSULE BY MOUTH EVERY DAY 01/27/24   Arellano Zameza, Priscila, MD  RA CRANBERRY 500 MG CAPS SMARTSIG:1 Capsule(s) By Mouth 11/02/21   [provider]  rosuvastatin  (CRESTOR ) 10 MG tablet TAKE 1 TABLET BY MOUTH EVERY DAY 12/20/23   Arellano Zameza, Priscila, MD  sildenafil (VIAGRA) 100 MG tablet Take 1 tablet by mouth as needed. 05/03/20   [provider]  spironolactone  (ALDACTONE ) 25 MG tablet Take 0.5 tablets (12.5 mg total) by mouth daily. 01/27/24   Shlomo Wilbert SAUNDERS, MD  tacrolimus (PROTOPIC) 0.1 % ointment Apply 1 application. topically as needed. 06/25/20   [provider]  tadalafil (CIALIS) 20 MG tablet Take 20 mg by mouth daily as needed. 01/29/22   [provider]    Allergies: Patient has no known allergies.    Review of Systems  Respiratory:  Positive for shortness of breath.   All other systems reviewed and are negative.   Updated Vital Signs BP 129/75 (BP Location: Right Arm)   Pulse (!) 55   Temp (!) 97.4 F (36.3 C)   Resp (!) 22   Ht 6' 1 (1.854 m)   Wt 97.5 kg   SpO2 97%   BMI 28.37 kg/m   Physical Exam Vitals  and nursing note reviewed.   73 year old male, resting comfortably and in no acute distress. Vital signs are significant for slightly elevated respiratory rate and slightly slow heart rate. Oxygen saturation is 97%, which is normal. Head is normocephalic and atraumatic. PERRLA, EOMI. Oropharynx is clear. Neck is nontender and supple without adenopathy. Lungs are clear without rales, wheezes, or rhonchi.  There is a slightly prolonged exhalation phase. Chest is nontender. Heart has regular rate and rhythm without murmur. Abdomen is soft, flat, nontender. Extremities have no cyanosis or edema, full  range of motion is present. Skin is warm and dry without rash. Neurologic: Mental status is normal, moves all extremities equally.  (all labs ordered are listed, but only abnormal results are displayed) Labs Reviewed  BASIC METABOLIC PANEL WITH GFR - Abnormal; Notable for the following components:      Result Value   Sodium 125 (*)    Chloride 90 (*)    Glucose, Bld 117 (*)    All other components within normal limits  CBC - Abnormal; Notable for the following components:   HCT 38.5 (*)    All other components within normal limits  RESP PANEL BY RT-PCR (RSV, FLU A&B, COVID)  [id number]  TROPONIN T, HIGH SENSITIVITY  TROPONIN T, HIGH SENSITIVITY    EKG: EKG Interpretation Date/Time:  Saturday May 05 2024 22:59:09 EDT Ventricular Rate:  53 PR Interval:  204 QRS Duration:  104 QT Interval:  450 QTC Calculation: 422 R Axis:   -45  Text Interpretation: Sinus bradycardia with Premature atrial complexes Left axis deviation ST & T wave abnormality, consider inferior ischemia ST & T wave abnormality, consider anterolateral ischemia Abnormal ECG When compared with ECG of 02-Jan-2024 15:31, Premature atrial complexes are now Present T wave inversion more evident in Inferior leads T wave inversion now evident in Lateral leads Confirmed by Yolande Charleston [id number]) on 05/05/2024 11:08:26 PM  Radiology: [facility] Chest 2 View Result Date: 05/06/2024 CLINICAL DATA:  chest pressure EXAM: CHEST - 2 VIEW COMPARISON:  Chest x-ray February 08, 2018. FINDINGS: The heart size and mediastinal contours are within normal limits. Both lungs are clear. No visible pleural effusions or pneumothorax. Remote left eighth rib fracture. No evidence of acute fracture. IMPRESSION: No active cardiopulmonary disease. Electronically Signed   By: Gilmore GORMAN Molt M.D.   On: 05/06/2024 00:06     Procedures   Medications Ordered in the ED  dexamethasone  (DECADRON ) tablet 10 mg (has no administration in time range)  albuterol   (VENTOLIN  HFA) 108 (90 Base) MCG/ACT inhaler 2 puff (has no administration in time range)  ibuprofen  (ADVIL ) tablet 400 mg (400 mg Oral Given 05/06/24 0224)  acetaminophen  (TYLENOL ) tablet 650 mg (650 mg Oral Given 05/06/24 0225)  ipratropium-albuterol  (DUONEB) 0.5-2.5 (3) MG/3ML nebulizer solution 3 mL (3 mLs Nebulization Given 05/06/24 0230)                                    Medical Decision Making Amount and/or Complexity of Data Reviewed Labs: ordered. Radiology: ordered.  Risk OTC drugs. Prescription drug management.   Influenza-like illness.  This is a presentation with a wide range of treatment options and carries with it a higher risk of morbidity and complications.  Differential diagnosis includes, but is not limited to, influenza, COVID-19, RSV, other viral respiratory infections, pneumonia.  Chest x-ray shows no active cardiopulmonary disease.  I have independently viewed the images, and  agree with the radiologist's interpretation.  I have reviewed his electrocardiogram, my interpretation is sinus bradycardia with PACs, nonspecific ST-T wave changes.  I have reviewed his laboratory tests, and my interpretation is negative PCR for COVID-19 and influenza and RSV, hyponatremia, elevated random glucose level, normal CBC.  I suspect his hyponatremia is related to his viral infection.  He does appear mildly breathless at rest, I have ordered a nebulizer treatments albuterol  and ipratropium.  I have ordered trial of acetaminophen  and ibuprofen  for pain control.  Pain is significantly improved with acetaminophen  and ibuprofen .  Patient has not noted any obvious difference with albuterol  with ipratropium via nebulizer, but I noticed that he is not working is hard to breathe and I feel he would benefit from ongoing nebulizer treatments.  I have ordered a dose of dexamethasone .  I am discharging him with instructions to continue using over-the-counter acetaminophen  and ibuprofen , use albuterol   inhaler as needed for cough or shortness of breath.  I have recommended follow-up with his primary care provider in 1 week to recheck his sodium level.     Final diagnoses:  Influenza-like illness  Myalgia  Hyponatremia  Elevated random blood glucose level    ED Discharge Orders          Ordered    albuterol  (VENTOLIN  HFA) 108 (90 Base) MCG/ACT inhaler  Every 4 hours PRN        05/06/24 0312               Raford Lenis, MD 05/06/24 [phone]

## 2024-05-06 NOTE — Discharge Instructions (Addendum)
 Drink plenty of fluids.  Take acetaminophen  and/or ibuprofen  as needed for body aches.  Please be aware that when you combine acetaminophen  and ibuprofen , you will get better pain relief than you get from taking other medication by itself.  Use the inhaler as needed for cough or shortness of breath.  Return to the emergency department if symptoms are getting worse.

## 2024-05-07 ENCOUNTER — Ambulatory Visit (HOSPITAL_COMMUNITY)
Admission: EM | Admit: 2024-05-07 | Discharge: 2024-05-07 | Disposition: A | Discharge status: Home / Self Care | Attending: Physician Assistant | Admitting: Physician Assistant

## 2024-05-07 ENCOUNTER — Emergency Department (HOSPITAL_COMMUNITY)

## 2024-05-07 ENCOUNTER — Encounter (HOSPITAL_COMMUNITY): Payer: Self-pay

## 2024-05-07 ENCOUNTER — Other Ambulatory Visit: Payer: Self-pay

## 2024-05-07 ENCOUNTER — Inpatient Hospital Stay (HOSPITAL_COMMUNITY)
Admission: EM | Admit: 2024-05-07 | Discharge: 2024-05-12 | DRG: 643 | Disposition: A | Discharge status: Home / Self Care | Attending: Infectious Diseases | Admitting: Infectious Diseases

## 2024-05-07 DIAGNOSIS — I428 Other cardiomyopathies: Secondary | ICD-10-CM | POA: Diagnosis not present

## 2024-05-07 DIAGNOSIS — I10 Essential (primary) hypertension: Secondary | ICD-10-CM | POA: Diagnosis present

## 2024-05-07 DIAGNOSIS — Z823 Family history of stroke: Secondary | ICD-10-CM

## 2024-05-07 DIAGNOSIS — Z792 Long term (current) use of antibiotics: Secondary | ICD-10-CM | POA: Diagnosis not present

## 2024-05-07 DIAGNOSIS — Z20822 Contact with and (suspected) exposure to covid-19: Secondary | ICD-10-CM | POA: Diagnosis present

## 2024-05-07 DIAGNOSIS — R000 Tachycardia, unspecified: Secondary | ICD-10-CM | POA: Diagnosis not present

## 2024-05-07 DIAGNOSIS — N3000 Acute cystitis without hematuria: Secondary | ICD-10-CM | POA: Diagnosis present

## 2024-05-07 DIAGNOSIS — Z8673 Personal history of transient ischemic attack (TIA), and cerebral infarction without residual deficits: Secondary | ICD-10-CM | POA: Diagnosis not present

## 2024-05-07 DIAGNOSIS — E871 Hypo-osmolality and hyponatremia: Secondary | ICD-10-CM | POA: Diagnosis not present

## 2024-05-07 DIAGNOSIS — E7800 Pure hypercholesterolemia, unspecified: Secondary | ICD-10-CM | POA: Diagnosis present

## 2024-05-07 DIAGNOSIS — K859 Acute pancreatitis without necrosis or infection, unspecified: Secondary | ICD-10-CM | POA: Diagnosis present

## 2024-05-07 DIAGNOSIS — E861 Hypovolemia: Secondary | ICD-10-CM | POA: Diagnosis present

## 2024-05-07 DIAGNOSIS — Z1152 Encounter for screening for COVID-19: Secondary | ICD-10-CM

## 2024-05-07 DIAGNOSIS — R112 Nausea with vomiting, unspecified: Secondary | ICD-10-CM

## 2024-05-07 DIAGNOSIS — N390 Urinary tract infection, site not specified: Secondary | ICD-10-CM | POA: Diagnosis not present

## 2024-05-07 DIAGNOSIS — R079 Chest pain, unspecified: Secondary | ICD-10-CM | POA: Diagnosis not present

## 2024-05-07 DIAGNOSIS — I5040 Unspecified combined systolic (congestive) and diastolic (congestive) heart failure: Secondary | ICD-10-CM | POA: Diagnosis not present

## 2024-05-07 DIAGNOSIS — Z79899 Other long term (current) drug therapy: Secondary | ICD-10-CM | POA: Diagnosis not present

## 2024-05-07 DIAGNOSIS — I771 Stricture of artery: Secondary | ICD-10-CM | POA: Diagnosis not present

## 2024-05-07 DIAGNOSIS — J9811 Atelectasis: Secondary | ICD-10-CM | POA: Diagnosis not present

## 2024-05-07 DIAGNOSIS — E782 Mixed hyperlipidemia: Secondary | ICD-10-CM | POA: Diagnosis present

## 2024-05-07 DIAGNOSIS — I5022 Chronic systolic (congestive) heart failure: Secondary | ICD-10-CM | POA: Diagnosis present

## 2024-05-07 DIAGNOSIS — F419 Anxiety disorder, unspecified: Secondary | ICD-10-CM | POA: Diagnosis not present

## 2024-05-07 DIAGNOSIS — R1013 Epigastric pain: Secondary | ICD-10-CM

## 2024-05-07 DIAGNOSIS — R001 Bradycardia, unspecified: Secondary | ICD-10-CM | POA: Diagnosis not present

## 2024-05-07 DIAGNOSIS — K449 Diaphragmatic hernia without obstruction or gangrene: Secondary | ICD-10-CM | POA: Diagnosis not present

## 2024-05-07 DIAGNOSIS — D649 Anemia, unspecified: Secondary | ICD-10-CM | POA: Diagnosis present

## 2024-05-07 DIAGNOSIS — Z791 Long term (current) use of non-steroidal anti-inflammatories (NSAID): Secondary | ICD-10-CM

## 2024-05-07 DIAGNOSIS — Z87891 Personal history of nicotine dependence: Secondary | ICD-10-CM

## 2024-05-07 DIAGNOSIS — J302 Other seasonal allergic rhinitis: Secondary | ICD-10-CM | POA: Diagnosis present

## 2024-05-07 DIAGNOSIS — R911 Solitary pulmonary nodule: Secondary | ICD-10-CM | POA: Diagnosis present

## 2024-05-07 DIAGNOSIS — Z8249 Family history of ischemic heart disease and other diseases of the circulatory system: Secondary | ICD-10-CM

## 2024-05-07 DIAGNOSIS — E876 Hypokalemia: Secondary | ICD-10-CM | POA: Diagnosis present

## 2024-05-07 DIAGNOSIS — I110 Hypertensive heart disease with heart failure: Secondary | ICD-10-CM | POA: Diagnosis not present

## 2024-05-07 DIAGNOSIS — K219 Gastro-esophageal reflux disease without esophagitis: Secondary | ICD-10-CM | POA: Diagnosis not present

## 2024-05-07 DIAGNOSIS — B9620 Unspecified Escherichia coli [E. coli] as the cause of diseases classified elsewhere: Secondary | ICD-10-CM | POA: Diagnosis present

## 2024-05-07 DIAGNOSIS — N400 Enlarged prostate without lower urinary tract symptoms: Secondary | ICD-10-CM | POA: Diagnosis not present

## 2024-05-07 DIAGNOSIS — K5730 Diverticulosis of large intestine without perforation or abscess without bleeding: Secondary | ICD-10-CM | POA: Diagnosis not present

## 2024-05-07 DIAGNOSIS — N281 Cyst of kidney, acquired: Secondary | ICD-10-CM | POA: Diagnosis not present

## 2024-05-07 DIAGNOSIS — I7121 Aneurysm of the ascending aorta, without rupture: Secondary | ICD-10-CM | POA: Diagnosis present

## 2024-05-07 DIAGNOSIS — E222 Syndrome of inappropriate secretion of antidiuretic hormone: Principal | ICD-10-CM | POA: Diagnosis present

## 2024-05-07 DIAGNOSIS — I517 Cardiomegaly: Secondary | ICD-10-CM | POA: Diagnosis not present

## 2024-05-07 DIAGNOSIS — R7401 Elevation of levels of liver transaminase levels: Secondary | ICD-10-CM | POA: Diagnosis not present

## 2024-05-07 DIAGNOSIS — K429 Umbilical hernia without obstruction or gangrene: Secondary | ICD-10-CM | POA: Diagnosis not present

## 2024-05-07 LAB — RESP PANEL BY RT-PCR (RSV, FLU A&B, COVID)  RVPGX2
Influenza A by PCR: NEGATIVE
Influenza B by PCR: NEGATIVE
Resp Syncytial Virus by PCR: NEGATIVE
SARS Coronavirus 2 by RT PCR: NEGATIVE

## 2024-05-07 LAB — I-STAT CHEM 8, ED
BUN: 17 mg/dL (ref 8–23)
Calcium, Ion: 1.01 mmol/L — ABNORMAL LOW (ref 1.15–1.40)
Chloride: 82 mmol/L — ABNORMAL LOW (ref 98–111)
Creatinine, Ser: 1 mg/dL (ref 0.61–1.24)
Glucose, Bld: 120 mg/dL — ABNORMAL HIGH (ref 70–99)
HCT: 44 % (ref 39.0–52.0)
Hemoglobin: 15 g/dL (ref 13.0–17.0)
Potassium: 4.2 mmol/L (ref 3.5–5.1)
Sodium: 115 mmol/L — CL (ref 135–145)
TCO2: 22 mmol/L (ref 22–32)

## 2024-05-07 LAB — CBC
HCT: 39.7 % (ref 39.0–52.0)
Hemoglobin: 14.4 g/dL (ref 13.0–17.0)
MCH: 31.1 pg (ref 26.0–34.0)
MCHC: 36.3 g/dL — ABNORMAL HIGH (ref 30.0–36.0)
MCV: 85.7 fL (ref 80.0–100.0)
Platelets: 193 K/uL (ref 150–400)
RBC: 4.63 MIL/uL (ref 4.22–5.81)
RDW: 11.9 % (ref 11.5–15.5)
WBC: 7.6 K/uL (ref 4.0–10.5)
nRBC: 0 % (ref 0.0–0.2)

## 2024-05-07 LAB — URINALYSIS, ROUTINE W REFLEX MICROSCOPIC
Bilirubin Urine: NEGATIVE
Glucose, UA: NEGATIVE mg/dL
Hgb urine dipstick: NEGATIVE
Ketones, ur: NEGATIVE mg/dL
Nitrite: POSITIVE — AB
Protein, ur: 30 mg/dL — AB
Specific Gravity, Urine: 1.017 (ref 1.005–1.030)
WBC, UA: >50 WBC/hpf (ref 0–5)
pH: 5 (ref 5.0–8.0)

## 2024-05-07 LAB — COMPREHENSIVE METABOLIC PANEL WITH GFR
ALT: 63 U/L — ABNORMAL HIGH (ref 0–44)
AST: 55 U/L — ABNORMAL HIGH (ref 15–41)
Albumin: 3.7 g/dL (ref 3.5–5.0)
Alkaline Phosphatase: 46 U/L (ref 38–126)
Anion gap: 15 (ref 5–15)
BUN: 14 mg/dL (ref 8–23)
CO2: 18 mmol/L — ABNORMAL LOW (ref 22–32)
Calcium: 8.4 mg/dL — ABNORMAL LOW (ref 8.9–10.3)
Chloride: 82 mmol/L — ABNORMAL LOW (ref 98–111)
Creatinine, Ser: 0.9 mg/dL (ref 0.61–1.24)
GFR, Estimated: >60 mL/min (ref >60)
Glucose, Bld: 122 mg/dL — ABNORMAL HIGH (ref 70–99)
Potassium: 3.9 mmol/L (ref 3.5–5.1)
Sodium: 115 mmol/L — CL (ref 135–145)
Total Bilirubin: 1.1 mg/dL (ref 0.0–1.2)
Total Protein: 6.5 g/dL (ref 6.5–8.1)

## 2024-05-07 LAB — TROPONIN I (HIGH SENSITIVITY)
Troponin I (High Sensitivity): 6 ng/L (ref <18)
Troponin I (High Sensitivity): 6 ng/L (ref <18)

## 2024-05-07 LAB — BASIC METABOLIC PANEL WITH GFR
Anion gap: 13 (ref 5–15)
BUN: 11 mg/dL (ref 8–23)
CO2: 21 mmol/L — ABNORMAL LOW (ref 22–32)
Calcium: 8 mg/dL — ABNORMAL LOW (ref 8.9–10.3)
Chloride: 82 mmol/L — ABNORMAL LOW (ref 98–111)
Creatinine, Ser: 0.94 mg/dL (ref 0.61–1.24)
GFR, Estimated: >60 mL/min (ref >60)
Glucose, Bld: 111 mg/dL — ABNORMAL HIGH (ref 70–99)
Potassium: 3.7 mmol/L (ref 3.5–5.1)
Sodium: 116 mmol/L — CL (ref 135–145)

## 2024-05-07 LAB — LIPID PANEL
Cholesterol: 130 mg/dL (ref 0–200)
HDL: 40 mg/dL — ABNORMAL LOW (ref >40)
LDL Cholesterol: 72 mg/dL (ref 0–99)
Total CHOL/HDL Ratio: 3.3 ratio
Triglycerides: 90 mg/dL (ref <150)
VLDL: 18 mg/dL (ref 0–40)

## 2024-05-07 LAB — OSMOLALITY, URINE: Osmolality, Ur: 537 mosm/kg (ref 300–900)

## 2024-05-07 LAB — NA AND K (SODIUM & POTASSIUM), RAND UR
Potassium Urine: 43 mmol/L
Sodium, Ur: 86 mmol/L

## 2024-05-07 LAB — OSMOLALITY: Osmolality: 245 mosm/kg — CL (ref 275–295)

## 2024-05-07 LAB — BRAIN NATRIURETIC PEPTIDE: B Natriuretic Peptide: 195 pg/mL — ABNORMAL HIGH (ref 0.0–100.0)

## 2024-05-07 LAB — LIPASE, BLOOD: Lipase: 19 U/L (ref 11–51)

## 2024-05-07 MED ORDER — IOHEXOL 350 MG/ML SOLN
75.0000 mL | Freq: Once | INTRAVENOUS | Status: AC | PRN
  Administered 2024-05-07: 75 mL via INTRAVENOUS

## 2024-05-07 MED ORDER — SODIUM CHLORIDE 0.9 % IV SOLN
1.0000 g | Freq: Once | INTRAVENOUS | Status: AC
  Administered 2024-05-07: 1 g via INTRAVENOUS
  Filled 2024-05-07: qty 10

## 2024-05-07 MED ORDER — ALBUTEROL SULFATE (2.5 MG/3ML) 0.083% IN NEBU
2.5000 mg | INHALATION_SOLUTION | RESPIRATORY_TRACT | Status: DC | PRN
  Administered 2024-05-10 – 2024-05-11 (×2): 2.5 mg via RESPIRATORY_TRACT
  Filled 2024-05-07 (×2): qty 3

## 2024-05-07 MED ORDER — ONDANSETRON HCL 4 MG/2ML IJ SOLN
4.0000 mg | Freq: Four times a day (QID) | INTRAMUSCULAR | Status: DC | PRN
  Administered 2024-05-08: 4 mg via INTRAVENOUS
  Filled 2024-05-07: qty 2

## 2024-05-07 MED ORDER — SODIUM CHLORIDE 0.9 % IV BOLUS
500.0000 mL | Freq: Once | INTRAVENOUS | Status: AC
Start: 2024-05-07 — End: 2024-05-08
  Administered 2024-05-07: 500 mL via INTRAVENOUS

## 2024-05-07 MED ORDER — ROSUVASTATIN CALCIUM 5 MG PO TABS
10.0000 mg | ORAL_TABLET | Freq: Every day | ORAL | Status: DC
Start: 2024-05-08 — End: 2024-05-12
  Administered 2024-05-08 – 2024-05-12 (×5): 10 mg via ORAL
  Filled 2024-05-07 (×5): qty 2

## 2024-05-07 MED ORDER — SODIUM CHLORIDE 0.9 % IV SOLN
2.0000 g | INTRAVENOUS | Status: DC
  Administered 2024-05-08 – 2024-05-09 (×2): 2 g via INTRAVENOUS
  Filled 2024-05-07 (×2): qty 20

## 2024-05-07 MED ORDER — ONDANSETRON HCL 4 MG/2ML IJ SOLN
4.0000 mg | Freq: Once | INTRAMUSCULAR | Status: AC
  Administered 2024-05-07: 4 mg via INTRAVENOUS
  Filled 2024-05-07: qty 2

## 2024-05-07 MED ORDER — HYDROMORPHONE HCL 1 MG/ML IJ SOLN
0.5000 mg | INTRAMUSCULAR | Status: DC | PRN
Start: 2024-05-07 — End: 2024-05-08
  Administered 2024-05-07 – 2024-05-08 (×4): 0.5 mg via INTRAVENOUS
  Filled 2024-05-07 (×4): qty 1

## 2024-05-07 MED ORDER — FINASTERIDE 5 MG PO TABS
5.0000 mg | ORAL_TABLET | Freq: Every day | ORAL | Status: DC
Start: 2024-05-07 — End: 2024-05-07

## 2024-05-07 MED ORDER — PANTOPRAZOLE SODIUM 40 MG PO TBEC
40.0000 mg | DELAYED_RELEASE_TABLET | Freq: Every day | ORAL | Status: DC
  Administered 2024-05-07 – 2024-05-12 (×6): 40 mg via ORAL
  Filled 2024-05-07 (×6): qty 1

## 2024-05-07 MED ORDER — SODIUM CHLORIDE 0.9 % IV BOLUS
500.0000 mL | Freq: Once | INTRAVENOUS | Status: AC
  Administered 2024-05-07: 500 mL via INTRAVENOUS

## 2024-05-07 MED ORDER — ASPIRIN 81 MG PO TBEC
81.0000 mg | DELAYED_RELEASE_TABLET | Freq: Every day | ORAL | Status: DC
  Administered 2024-05-07 – 2024-05-12 (×6): 81 mg via ORAL
  Filled 2024-05-07 (×6): qty 1

## 2024-05-07 MED ORDER — METOPROLOL SUCCINATE ER 25 MG PO TB24
25.0000 mg | ORAL_TABLET | Freq: Every day | ORAL | Status: DC
  Administered 2024-05-08 – 2024-05-12 (×5): 25 mg via ORAL
  Filled 2024-05-07 (×5): qty 1

## 2024-05-07 MED ORDER — ENOXAPARIN SODIUM 40 MG/0.4ML IJ SOSY
40.0000 mg | PREFILLED_SYRINGE | INTRAMUSCULAR | Status: DC
  Administered 2024-05-07 – 2024-05-11 (×5): 40 mg via SUBCUTANEOUS
  Filled 2024-05-07 (×5): qty 0.4

## 2024-05-07 MED ORDER — ROSUVASTATIN CALCIUM 5 MG PO TABS: 10.0000 mg | ORAL_TABLET | Freq: Every day | ORAL | Status: DC

## 2024-05-07 MED ORDER — ALUM & MAG HYDROXIDE-SIMETH 200-200-20 MG/5ML PO SUSP
30.0000 mL | Freq: Once | ORAL | Status: AC
  Administered 2024-05-07: 30 mL via ORAL
  Filled 2024-05-07: qty 30

## 2024-05-07 MED ORDER — TAMSULOSIN HCL 0.4 MG PO CAPS
0.4000 mg | ORAL_CAPSULE | Freq: Every day | ORAL | Status: DC
  Administered 2024-05-07 – 2024-05-12 (×6): 0.4 mg via ORAL
  Filled 2024-05-07 (×6): qty 1

## 2024-05-07 MED ORDER — SODIUM CHLORIDE 0.9 % IV SOLN: INTRAVENOUS | Status: DC

## 2024-05-07 MED ORDER — FINASTERIDE 5 MG PO TABS
5.0000 mg | ORAL_TABLET | Freq: Every day | ORAL | Status: DC
Start: 2024-05-08 — End: 2024-05-12
  Administered 2024-05-08 – 2024-05-12 (×5): 5 mg via ORAL
  Filled 2024-05-07 (×5): qty 1

## 2024-05-07 MED ORDER — LIDOCAINE VISCOUS HCL 2 % MT SOLN
15.0000 mL | Freq: Once | OROMUCOSAL | Status: AC
  Administered 2024-05-07: 15 mL via ORAL
  Filled 2024-05-07: qty 15

## 2024-05-07 NOTE — ED Triage Notes (Signed)
 Pt sent by UC for 9/10 epigastric pain, told he needed a CT.

## 2024-05-07 NOTE — ED Notes (Signed)
 Pt asked for nausea med and something for the pain

## 2024-05-07 NOTE — ED Notes (Signed)
 Patient is being discharged from the Urgent Care and sent to the Emergency Department via private vehicle . Per proivider, patient is in need of higher level of care due to mid upper abd pain. Patient is aware and verbalizes understanding of plan of care.  Vitals:   05/07/24 1234  BP: (!) 155/95  Pulse: (!) 59  Resp: 16  Temp: 98 F (36.7 C)  SpO2: 96%

## 2024-05-07 NOTE — ED Provider Notes (Signed)
 [name] EMERGENCY DEPARTMENT AT [facility] Provider Note  CSN: [id number] Arrival date & time: 05/07/24 1335  Chief Complaint(s) Chest Pain  HPI Edward [name] is a 73 y.o. male with past medical history as below, significant for cardiomyopathy, HLD, BPH, GERD, ascending aortic dilation who presents to the ED with complaint of epigastric pain, nausea  Patient here with family, reports onset of symptoms a couple days ago.  Has been gradually worsening.  Pain primarily to epigastrium, sharp and stabbing.  Does not really radiate anywhere.  No associated chest pain, fevers or chills, he is having some nausea with occasional emesis.  Poor p.o. intake with food but drinking liquids wnl.  No changes symptoms following p.o. intake. Emesis nonbloody nonbilious.  No diarrhea, no change to urination.  Denies similar symptoms in the past.  He tried Motrin  and Tylenol  for symptoms which seems to have made no improvement.  He was seen at [facility] yesterday with URI type symptoms, found to have sodium 125.   He was seen this morning urgent care and sent to the ER for evaluation  Past Medical History Past Medical History:  Diagnosis Date   Abnormal EKG 05/29/2018   Sinus bradycardia and LAFB with TWI V4-V6, III and aVF   Benign essential HTN 05/29/2018   Cardiomyopathy (HCC)    a. dx by echo 07/2020.   Dilation of aorta (HCC)    Environmental allergies    Fracture, ribs    H/O: varicose veins    Hearing loss    Hyperlipidemia LDL goal <70    Multiple food allergies    Rash    Seasonal allergies    Sinusitis    Stroke [facility])    s/p tpa   Patient Active Problem List   Diagnosis Date Noted   BPH (benign prostatic hyperplasia) 08/22/2023   GERD (gastroesophageal reflux disease) 11/15/2022   Esophageal dysphagia 11/15/2022   Seasonal allergies 11/15/2022   Ischemic vascular disease 11/15/2022   Chronic sinusitis 09/09/2022   Chronic systolic heart failure (HCC) 07/02/2022    Myalgia due to statin 07/28/2021   Mixed hyperlipidemia 06/11/2020   Solitary pulmonary nodule 08/08/2019   Umbilical hernia 08/08/2019   Elevated LDL cholesterol level 08/08/2019   Ascending aorta dilation (HCC) 05/24/2019   Benign head tremor 05/24/2019   Abnormal EKG 05/29/2018   Benign essential HTN 05/29/2018   Home Medication(s) Prior to Admission medications   Medication Sig Start Date End Date Taking? Authorizing Provider  tamsulosin  (FLOMAX ) 0.4 MG CAPS capsule TAKE 1 CAPSULE BY MOUTH EVERY DAY 01/24/24   Arellano Zameza, Priscila, MD  albuterol  (VENTOLIN  HFA) 108 (90 Base) MCG/ACT inhaler Inhale 2 puffs into the lungs every 4 (four) hours as needed for wheezing or shortness of breath. 05/06/24   Raford Alm, MD  aspirin  EC 81 MG tablet Take 81 mg by mouth daily. Swallow whole.    [provider]  Cinnamon 500 MG capsule SMARTSIG:1 Capsule(s) By Mouth 11/02/21   [provider]  Coenzyme Q10 (COQ-10) 100 MG CAPS SMARTSIG:1 Capsule(s) By Mouth 11/02/21   [provider]  finasteride  (PROSCAR ) 5 MG tablet TAKE 1 TABLET (5 MG TOTAL) BY MOUTH DAILY. 01/09/24 01/08/25  Arellano Zameza, Priscila, MD  meloxicam  (MOBIC ) 15 MG tablet Take 1 tablet (15 mg total) by mouth daily as needed for pain. 04/19/24   Syeda, Raeeha, DO  metoprolol  succinate (TOPROL -XL) 25 MG 24 hr tablet TAKE 1 TABLET (25 MG) BY MOUTH DAILY. APPOINTMENT NEEDED FOR CONTINUATION OF  REFILLS-1ST ATTEMPT 01/24/24   Shlomo Wilbert SAUNDERS, MD  N-ACETYL CYSTEINE 600 MG TABS SMARTSIG:1 Tablet(s) By Mouth 11/02/21   [provider]  omeprazole  (PRILOSEC) 20 MG capsule TAKE 1 CAPSULE BY MOUTH EVERY DAY 01/27/24   Arellano Zameza, Priscila, MD  RA CRANBERRY 500 MG CAPS SMARTSIG:1 Capsule(s) By Mouth 11/02/21   [provider]  rosuvastatin  (CRESTOR ) 10 MG tablet TAKE 1 TABLET BY MOUTH EVERY DAY 12/20/23   Arellano Zameza, Priscila, MD  sildenafil (VIAGRA) 100 MG tablet Take 1 tablet by mouth as needed. 05/03/20    [provider]  spironolactone  (ALDACTONE ) 25 MG tablet Take 0.5 tablets (12.5 mg total) by mouth daily. 01/27/24   Shlomo Wilbert SAUNDERS, MD  tadalafil (CIALIS) 20 MG tablet Take 20 mg by mouth daily as needed. 01/29/22   [provider]                                                                                                                                    Past Surgical History Past Surgical History:  Procedure Laterality Date   COLONOSCOPY     INGUINAL HERNIA REPAIR Left    right hand surgery     Family History Family History  Problem Relation Age of Onset   CVA Mother    Heart failure Father        CHF   Brain cancer Brother        GBM   Colon polyps Neg Hx    Colon cancer Neg Hx    Esophageal cancer Neg Hx    Rectal cancer Neg Hx    Stomach cancer Neg Hx     Social History Social History   Tobacco Use   Smoking status: Former   Smokeless tobacco: Never   Tobacco comments:    quit 40+ years ago  Vaping Use   Vaping status: Never Used  Substance Use Topics   Alcohol use: Yes    Comment: wine 3 x a week   Drug use: Never   Allergies Patient has no known allergies.  Review of Systems A thorough review of systems was obtained and all systems are negative except as noted in the HPI and PMH.   Physical Exam Vital Signs  I have reviewed the triage vital signs BP (!) 148/131   Pulse (!) 55   Temp 97.8 F (36.6 C)   Resp 12   Ht 6' 1 (1.854 m)   Wt 97.5 kg   SpO2 100%   BMI 28.37 kg/m  Physical Exam Vitals and nursing note reviewed.  Constitutional:      General: He is not in acute distress.    Appearance: He is well-developed. He is not ill-appearing.  HENT:     Head: Normocephalic and atraumatic.     Right Ear: External ear normal.     Left Ear: External ear normal.     Mouth/Throat:  Mouth: Mucous membranes are moist.  Eyes:     General: No scleral icterus. Cardiovascular:     Rate and Rhythm: Regular rhythm. Bradycardia  present.     Pulses: Normal pulses.     Heart sounds: Normal heart sounds.  Pulmonary:     Effort: Pulmonary effort is normal. No respiratory distress.     Breath sounds: Normal breath sounds. No decreased breath sounds.  Abdominal:     General: Abdomen is flat.     Palpations: Abdomen is soft.     Tenderness: There is abdominal tenderness in the epigastric area.   Musculoskeletal:     Cervical back: No rigidity.     Right lower leg: No edema.     Left lower leg: No edema.  Skin:    General: Skin is warm and dry.     Capillary Refill: Capillary refill takes less than 2 seconds.  Neurological:     Mental Status: He is alert.  Psychiatric:        Mood and Affect: Mood normal.        Behavior: Behavior normal.     ED Results and Treatments Labs (all labs ordered are listed, but only abnormal results are displayed) Labs Reviewed  COMPREHENSIVE METABOLIC PANEL WITH GFR - Abnormal; Notable for the following components:      Result Value   Sodium 115 (*)    Chloride 82 (*)    CO2 18 (*)    Glucose, Bld 122 (*)    Calcium  8.4 (*)    AST 55 (*)    ALT 63 (*)    All other components within normal limits  CBC - Abnormal; Notable for the following components:   MCHC 36.3 (*)    All other components within normal limits  URINALYSIS, ROUTINE W REFLEX MICROSCOPIC - Abnormal; Notable for the following components:   Color, Urine AMBER (*)    APPearance CLOUDY (*)    Protein, ur 30 (*)    Nitrite POSITIVE (*)    Leukocytes,Ua LARGE (*)    Bacteria, UA MANY (*)    Non Squamous Epithelial 0-5 (*)    All other components within normal limits  I-STAT CHEM 8, ED - Abnormal; Notable for the following components:   Sodium 115 (*)    Chloride 82 (*)    Glucose, Bld 120 (*)    Calcium , Ion 1.01 (*)    All other components within normal limits  RESP PANEL BY RT-PCR (RSV, FLU A&B, COVID)  RVPGX2  URINE CULTURE  LIPASE, BLOOD  OSMOLALITY  NA AND K (SODIUM & POTASSIUM), RAND UR   OSMOLALITY, URINE  BRAIN NATRIURETIC PEPTIDE  TROPONIN I (HIGH SENSITIVITY)  TROPONIN I (HIGH SENSITIVITY)                                                                                                                          Radiology DG Chest 2 View Result Date: 05/07/2024 EXAM: 2 VIEW(S) XRAY OF  THE CHEST 05/07/2024 02:09:00 PM COMPARISON: 05/05/2024 CLINICAL HISTORY: Chest pain. Per triage: Pt c.o epigastric pain since Friday with some n/v. Denies diarrhea or SOB. FINDINGS: LUNGS AND PLEURA: Bibasilar atelectasis. No pulmonary edema. No pleural effusion. No pneumothorax. HEART AND MEDIASTINUM: Mild cardiomegaly. No acute abnormality of the cardiac and mediastinal silhouettes. No congestive heart failure. BONES AND SOFT TISSUES: Multilevel thoracic osteophytosis. No acute osseous abnormality. VASCULATURE: Tortuous aorta. LIMITATIONS/ARTIFACTS: Lateral view degraded by patient arm position, not raised above the head. IMPRESSION: 1. No acute findings. 2. Mild cardiomegaly without congestive heart failure. Electronically signed by: Rockey Kilts MD 05/07/2024 02:40 PM EDT RP Workstation: [id number]    Pertinent labs & imaging results that were available during my care of the patient were reviewed by me and considered in my medical decision making (see MDM for details).  Medications Ordered in ED Medications  sodium chloride  0.9 % bolus 500 mL (has no administration in time range)  cefTRIAXone  (ROCEPHIN ) 1 g in sodium chloride  0.9 % 100 mL IVPB (has no administration in time range)  alum & mag hydroxide-simeth (MAALOX/MYLANTA) 200-200-20 MG/5ML suspension 30 mL (30 mLs Oral Given 05/07/24 1438)    And  lidocaine  (XYLOCAINE ) 2 % viscous mouth solution 15 mL (15 mLs Oral Given 05/07/24 1438)  ondansetron  (ZOFRAN ) injection 4 mg (4 mg Intravenous Given 05/07/24 1439)  iohexol  (OMNIPAQUE ) 350 MG/ML injection 75 mL (75 mLs Intravenous Contrast Given 05/07/24 1549)                                                                                                                                      Procedures .Critical Care  Performed by: Elnor Jayson LABOR, DO Authorized by: Elnor Jayson LABOR, DO   Critical care provider statement:    Critical care time (minutes):  30   Critical care time was exclusive of:  Separately billable procedures and treating other patients   Critical care was necessary to treat or prevent imminent or life-threatening deterioration of the following conditions:  Metabolic crisis   Critical care was time spent personally by me on the following activities:  Development of treatment plan with patient or surrogate, discussions with consultants, evaluation of patient's response to treatment, examination of patient, ordering and review of laboratory studies, ordering and review of radiographic studies, ordering and performing treatments and interventions, pulse oximetry, re-evaluation of patient's condition, review of old charts and obtaining history from patient or surrogate   (including critical care time)  Medical Decision Making / ED Course    Medical Decision Making:    Adoni Greenough is a 73 y.o. male  with past medical history as below, significant for cardiomyopathy, HLD, BPH, GERD, ascending aortic dilation who presents to the ED with complaint of epigastric pain, nausea. The complaint involves an extensive differential diagnosis and also carries with it a high risk of complications and morbidity.  Serious etiology was considered. Ddx includes but is  not limited to: Differential diagnosis includes but is not exclusive to acute cholecystitis, intrathoracic causes for epigastric abdominal pain, gastritis, duodenitis, pancreatitis, small bowel or large bowel obstruction, abdominal aortic aneurysm, hernia, gastritis, etc.   Complete initial physical exam performed, notably the patient was in no acute distress, resting comfortably.    Reviewed and confirmed nursing documentation for  past medical history, family history, social history.  Vital signs reviewed.    Epigastric pain > - Pain ongoing over the past few days, associate with nausea and vomiting.  Poor p.o. intake. - gi cocktail, CTAP - symptoms improved after intervention, nausea resolved  Hyponatremia > - Sodium 125 yesterday, today sodium 115 > send urine lytes/osm/urine osm - given 500mL NS - spironolactone  use, reports poor food intake last few days but tolerating liquids/water okay  - pt will require admission  Acute cystitis > - not really having UTI s/s - UA is compelling, send culture, start rocephin                      Additional history obtained: -Additional history obtained from family -External records from outside source obtained and reviewed including: Chart review including previous notes, labs, imaging, consultation notes including  Prior er eval Home meds   Lab Tests: -I ordered, reviewed, and interpreted labs.   The pertinent results include:   Labs Reviewed  COMPREHENSIVE METABOLIC PANEL WITH GFR - Abnormal; Notable for the following components:      Result Value   Sodium 115 (*)    Chloride 82 (*)    CO2 18 (*)    Glucose, Bld 122 (*)    Calcium  8.4 (*)    AST 55 (*)    ALT 63 (*)    All other components within normal limits  CBC - Abnormal; Notable for the following components:   MCHC 36.3 (*)    All other components within normal limits  URINALYSIS, ROUTINE W REFLEX MICROSCOPIC - Abnormal; Notable for the following components:   Color, Urine AMBER (*)    APPearance CLOUDY (*)    Protein, ur 30 (*)    Nitrite POSITIVE (*)    Leukocytes,Ua LARGE (*)    Bacteria, UA MANY (*)    Non Squamous Epithelial 0-5 (*)    All other components within normal limits  I-STAT CHEM 8, ED - Abnormal; Notable for the following components:   Sodium 115 (*)    Chloride 82 (*)    Glucose, Bld 120 (*)    Calcium , Ion 1.01 (*)    All other components within normal  limits  RESP PANEL BY RT-PCR (RSV, FLU A&B, COVID)  RVPGX2  URINE CULTURE  LIPASE, BLOOD  OSMOLALITY  NA AND K (SODIUM & POTASSIUM), RAND UR  OSMOLALITY, URINE  BRAIN NATRIURETIC PEPTIDE  TROPONIN I (HIGH SENSITIVITY)  TROPONIN I (HIGH SENSITIVITY)    Notable for hypoNa  EKG   EKG Interpretation Date/Time:    Ventricular Rate:    PR Interval:    QRS Duration:    QT Interval:    QTC Calculation:   R Axis:      Text Interpretation:           Imaging Studies ordered: I ordered imaging studies including CXR, CTA I independently visualized the following imaging with scope of interpretation limited to determining acute life threatening conditions related to emergency care; findings noted above I agree with the radiologist interpretation If any imaging was obtained with contrast I closely monitored patient  for any possible adverse reaction a/w contrast administration in the emergency department   Medicines ordered and prescription drug management: Meds ordered this encounter  Medications   AND Linked Order Group    alum & mag hydroxide-simeth (MAALOX/MYLANTA) 200-200-20 MG/5ML suspension 30 mL    lidocaine  (XYLOCAINE ) 2 % viscous mouth solution 15 mL   ondansetron  (ZOFRAN ) injection 4 mg   sodium chloride  0.9 % bolus 500 mL   cefTRIAXone  (ROCEPHIN ) 1 g in sodium chloride  0.9 % 100 mL IVPB    Antibiotic Indication::   UTI   iohexol  (OMNIPAQUE ) 350 MG/ML injection 75 mL    -I have reviewed the patients home medicines and have made adjustments as needed   Consultations Obtained: na   Cardiac Monitoring: The patient was maintained on a cardiac monitor.  I personally viewed and interpreted the cardiac monitored which showed an underlying rhythm of: nsr Continuous pulse oximetry interpreted by myself, 100% on ra.    Social Determinants of Health:  Diagnosis or treatment significantly limited by social determinants of health: former smoker   Reevaluation: After the  interventions noted above, I reevaluated the patient and found that they have improved  Co morbidities that complicate the patient evaluation  Past Medical History:  Diagnosis Date   Abnormal EKG 05/29/2018   Sinus bradycardia and LAFB with TWI V4-V6, III and aVF   Benign essential HTN 05/29/2018   Cardiomyopathy (HCC)    a. dx by echo 07/2020.   Dilation of aorta (HCC)    Environmental allergies    Fracture, ribs    H/O: varicose veins    Hearing loss    Hyperlipidemia LDL goal <70    Multiple food allergies    Rash    Seasonal allergies    Sinusitis    Stroke [facility])    s/p tpa      Dispostion: Disposition decision including need for hospitalization was considered, and patient disposition pending at time of sign out.    Final Clinical Impression(s) / ED Diagnoses Final diagnoses:  Hyponatremia  Nausea and vomiting, unspecified vomiting type  Acute cystitis without hematuria        Elnor Jayson LABOR, DO 05/07/24 1555

## 2024-05-07 NOTE — ED Provider Notes (Signed)
 [facility]    CSN: [id number] Arrival date & time: 05/07/24  1148      History   Chief Complaint Chief Complaint  Patient presents with   Abdominal Pain    HPI Edward [name] is a 73 y.o. male.   Patient complains of pain in the epigastric and upper abdominal area.  Patient reports he was seen in the emergency department 9 7 AM.  Patient reports that the pain was mostly in his chest when he was seen there.  Patient states he felt like he was getting some type of virus because he ached in his chest and his shoulders.  Patient points to his mid epigastric area as the area of pain.  He states the pain is now 10 out of 10.  He has been taking Tylenol  and ibuprofen  without relief.  Patient reports he is very uncomfortable.  Patient states the pain is almost bad enough to make him cry.  Patient has a past medical history of cardiomyopathy hyperlipidemia hypertension, GERD and a known ascending aortic aneurysm last evaluated 2 years ago by CT and was 4 cm.  The history is provided by the patient and a relative.  Abdominal Pain Pain location:  Epigastric Pain radiates to:  Does not radiate Pain severity:  Moderate Onset quality:  Sudden Timing:  Constant Progression:  Worsening Chronicity:  New   Past Medical History:  Diagnosis Date   Abnormal EKG 05/29/2018   Sinus bradycardia and LAFB with TWI V4-V6, III and aVF   Benign essential HTN 05/29/2018   Cardiomyopathy (HCC)    a. dx by echo 07/2020.   Dilation of aorta (HCC)    Environmental allergies    Fracture, ribs    H/O: varicose veins    Hearing loss    Hyperlipidemia LDL goal <70    Multiple food allergies    Rash    Seasonal allergies    Sinusitis    Stroke [facility])    s/p tpa    Patient Active Problem List   Diagnosis Date Noted   BPH (benign prostatic hyperplasia) 08/22/2023   GERD (gastroesophageal reflux disease) 11/15/2022   Esophageal dysphagia 11/15/2022   Seasonal allergies 11/15/2022   Ischemic  vascular disease 11/15/2022   Chronic sinusitis 09/09/2022   Chronic systolic heart failure (HCC) 07/02/2022   Myalgia due to statin 07/28/2021   Mixed hyperlipidemia 06/11/2020   Solitary pulmonary nodule 08/08/2019   Umbilical hernia 08/08/2019   Elevated LDL cholesterol level 08/08/2019   Ascending aorta dilation (HCC) 05/24/2019   Benign head tremor 05/24/2019   Abnormal EKG 05/29/2018   Benign essential HTN 05/29/2018    Past Surgical History:  Procedure Laterality Date   COLONOSCOPY     INGUINAL HERNIA REPAIR Left    right hand surgery         Home Medications    Prior to Admission medications   Medication Sig Start Date End Date Taking? Authorizing Provider  tamsulosin  (FLOMAX ) 0.4 MG CAPS capsule TAKE 1 CAPSULE BY MOUTH EVERY DAY 01/24/24   Arellano Zameza, Priscila, MD  albuterol  (VENTOLIN  HFA) 108 (90 Base) MCG/ACT inhaler Inhale 2 puffs into the lungs every 4 (four) hours as needed for wheezing or shortness of breath. 05/06/24   Raford Alm, MD  aspirin  EC 81 MG tablet Take 81 mg by mouth daily. Swallow whole.    [provider]  Cinnamon 500 MG capsule SMARTSIG:1 Capsule(s) By Mouth 11/02/21   [provider]  Coenzyme Q10 (COQ-10) 100  MG CAPS SMARTSIG:1 Capsule(s) By Mouth 11/02/21   [provider]  finasteride  (PROSCAR ) 5 MG tablet TAKE 1 TABLET (5 MG TOTAL) BY MOUTH DAILY. 01/09/24 01/08/25  Arellano Zameza, Priscila, MD  meloxicam  (MOBIC ) 15 MG tablet Take 1 tablet (15 mg total) by mouth daily as needed for pain. 04/19/24   Syeda, Raeeha, DO  metoprolol  succinate (TOPROL -XL) 25 MG 24 hr tablet TAKE 1 TABLET (25 MG) BY MOUTH DAILY. APPOINTMENT NEEDED FOR CONTINUATION OF REFILLS-1ST ATTEMPT 01/24/24   Shlomo Wilbert SAUNDERS, MD  N-ACETYL CYSTEINE 600 MG TABS SMARTSIG:1 Tablet(s) By Mouth 11/02/21   [provider]  omeprazole  (PRILOSEC) 20 MG capsule TAKE 1 CAPSULE BY MOUTH EVERY DAY 01/27/24   Arellano Zameza, Priscila, MD  RA CRANBERRY 500 MG  CAPS SMARTSIG:1 Capsule(s) By Mouth 11/02/21   [provider]  rosuvastatin  (CRESTOR ) 10 MG tablet TAKE 1 TABLET BY MOUTH EVERY DAY 12/20/23   Arellano Zameza, Priscila, MD  sildenafil (VIAGRA) 100 MG tablet Take 1 tablet by mouth as needed. 05/03/20   [provider]  spironolactone  (ALDACTONE ) 25 MG tablet Take 0.5 tablets (12.5 mg total) by mouth daily. 01/27/24   Shlomo Wilbert SAUNDERS, MD  tadalafil (CIALIS) 20 MG tablet Take 20 mg by mouth daily as needed. 01/29/22   [provider]    Family History Family History  Problem Relation Age of Onset   CVA Mother    Heart failure Father        CHF   Brain cancer Brother        GBM   Colon polyps Neg Hx    Colon cancer Neg Hx    Esophageal cancer Neg Hx    Rectal cancer Neg Hx    Stomach cancer Neg Hx     Social History Social History   Tobacco Use   Smoking status: Former   Smokeless tobacco: Never   Tobacco comments:    quit 40+ years ago  Vaping Use   Vaping status: Never Used  Substance Use Topics   Alcohol use: Yes    Comment: wine 3 x a week   Drug use: Never     Allergies   Patient has no known allergies.   Review of Systems Review of Systems  Gastrointestinal:  Positive for abdominal pain.  All other systems reviewed and are negative.    Physical Exam Triage Vital Signs ED Triage Vitals  Encounter Vitals Group     BP 05/07/24 1234 (!) 155/95     Girls Systolic BP Percentile --      Girls Diastolic BP Percentile --      Boys Systolic BP Percentile --      Boys Diastolic BP Percentile --      Pulse Rate 05/07/24 1234 (!) 59     Resp 05/07/24 1234 16     Temp 05/07/24 1234 98 F (36.7 C)     Temp Source 05/07/24 1234 Oral     SpO2 05/07/24 1234 96 %     Weight --      Height --      Head Circumference --      Peak Flow --      Pain Score 05/07/24 1238 8     Pain Loc --      Pain Education --      Exclude from Growth Chart --    No data found.  Updated Vital Signs BP (!)  155/95 (BP Location: Left Arm)   Pulse (!) 59  Temp 98 F (36.7 C) (Oral)   Resp 16   SpO2 96%   Visual Acuity Right Eye Distance:   Left Eye Distance:   Bilateral Distance:    Right Eye Near:   Left Eye Near:    Bilateral Near:     Physical Exam Vitals reviewed.  Constitutional:      Appearance: He is well-developed.  Cardiovascular:     Rate and Rhythm: Bradycardia present.  Pulmonary:     Effort: Pulmonary effort is normal.  Abdominal:     General: Bowel sounds are normal. There is distension.     Tenderness: There is abdominal tenderness in the epigastric area.  Skin:    General: Skin is warm.  Neurological:     Mental Status: He is alert.      UC Treatments / Results  Labs (all labs ordered are listed, but only abnormal results are displayed) Labs Reviewed - No data to display  EKG   Radiology DG Chest 2 View Result Date: 05/06/2024 CLINICAL DATA:  chest pressure EXAM: CHEST - 2 VIEW COMPARISON:  Chest x-ray February 08, 2018. FINDINGS: The heart size and mediastinal contours are within normal limits. Both lungs are clear. No visible pleural effusions or pneumothorax. Remote left eighth rib fracture. No evidence of acute fracture. IMPRESSION: No active cardiopulmonary disease. Electronically Signed   By: Gilmore GORMAN Molt M.D.   On: 05/06/2024 00:06    Procedures Procedures (including critical care time)  Medications Ordered in UC Medications - No data to display  Initial Impression / Assessment and Plan / UC Course  I have reviewed the triage vital signs and the nursing notes.  Pertinent labs & imaging results that were available during my care of the patient were reviewed by me and considered in my medical decision making (see chart for details).     EKG  St changes,  no acute  Pt to ED for evaluation  Final Clinical Impressions(s) / UC Diagnoses   Final diagnoses:  Epigastric pain   Discharge Instructions   None    ED Prescriptions   None     PDMP not reviewed this encounter.   Flint Sonny POUR, PA-C 05/07/24 1335

## 2024-05-07 NOTE — Discharge Instructions (Signed)
Go to the ED now

## 2024-05-07 NOTE — ED Provider Notes (Signed)
 At change of shift care signed out from off going team, patient is 73 years old with some progressive generalized weakness, presents with some epigastric pain as well as nausea and weakness, found to be hyponatremic with acute pancreatitis and a UTI.  I just discussed the case with the physicians to come admit the patient, they are agreeable.   Cleotilde Rogue, MD 05/07/24 [phone]

## 2024-05-07 NOTE — Hospital Course (Addendum)
 Presetned with persistent and progressive weakness and epigastric pain. Took him in signout. Progressive hyponatremia, down to 115. Pancreatitis. Not his first visit. Visit yesterday. Placed on spironolactone   Friday was having some discomfort and pain in abdominal area, and right shoulder. Saturday night, needed something to treat the pain 10/10. Went to ER at [facility], thought it was flu, negative at [facility]. Pain was coming and going in abdominal area. Would last at least 30 minutes. Could not figure out a way to make it worse or make it better. Took 650 of tylenol  and 400 of ibuprofen . Vomiting started after that. Medications did help with the pain. Last wekeend visit from [facility] in [place]. Able to eat but didn't have any appetite at all. Able to eat a few nuggest now. A little bit of blood in his vomit. No recent alcohol use, hasn't drink anything in the last week. Usually just a glass of wine with dinner. Been on spironolactone . No pain with urination, has had somewhat increased frequency.   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      9/9: super agitated last night from beeping in the ED, sleeping pretty hard  Exam: euvolemic

## 2024-05-07 NOTE — ED Triage Notes (Signed)
 Pt c.o epigastric pain since Friday with some n/v. Denies diarrhea or SOB

## 2024-05-07 NOTE — ED Triage Notes (Signed)
 Patient here today with c/o mid upper abd pain since Friday evening. Patient went to the ED on Saturday and c/o cough and congestion. Denies SOB or wheeze. Patient has been taking Tylenol  and Advil  with some relief. Patient vomited last night and this morning. Denies nausea.

## 2024-05-07 NOTE — H&P (Signed)
 Date: 05/07/2024               Patient Name:  Edward [name] MRN: [id number]  DOB: Jun 24, 1951 Age / Sex: 73 y.o., male   PCP: Edgardo Pontiff, DO         Medical Service: Internal Medicine Teaching Service         Attending Physician: Dr. Dayton Eastern      First Contact: Letha Cheadle, MD    Second Contact: Dr. Roetta Chars, MD          Pager Information: First Contact Pager: [phone]   Second Contact Pager: [phone]   SUBJECTIVE   Chief Complaint: Abdominal Pain  History of Present Illness  Edward [name] is a 73 y.o. male with PMHx of Nonischemic Cardiomyopathy, CVA in 2016, mild ascending aortic aneurysm, HTN, HLD who presents for evaluation of abdominal pain. He states that his symptoms started on Friday, where he was having some discomfort and pain in his mid epigastric region, as well as his right shoulder.  The pain would be intermittent, and last about 30 minutes.  He is unaware of any exacerbating factors or relieving factors.  The pain progressed Saturday night to about a 10 out of 10, so he decided to present to the [facility] urgent [facility].  He initially thought his symptoms were related to COVID, he did recently travel to [place] where his daughter recently tested positive for COVID.  Subsequently, he was tested for flu and COVID which were both negative.  He received 650 mg of Tylenol , 400 mg of ibuprofen  which she states initially helped the pain, but then he developed profuse vomiting with some blood.  He was able to eat, but felt like he did not have an appetite at all.  In the emergency department he had a few chicken nuggets from [employer].  He does not have any recent alcohol use, but usually drinks a glass of wine with dinner.  Furthermore, he has also had increased frequency with urination since his symptoms started on Friday, but does not know if he is having dysuria as most of his pain is in his abdominal area.  ED Course:  Labs significant for sodium of 115 Imaging: Acute  uncomplicated pancreatitis seen on CT abdomen pelvis Received 500 cc normal saline Consults: None  Meds   Flomax  .4mg  daily  Albuterol  inhaler PRN Saturday night not used  Aspirin  81mg   Proscar  5mg   Meloxicam  15mg  for knee pain four times a week  Metoprolol  25mg   Imeprazole 20mg   Crestor  10mg   Spironolactone  12.5mg   Daily Multivitamin  Cranberry  Cinnamon 500mg   Coenzyme Q10    Allergies  Allergies as of 05/07/2024   (No Known Allergies)     Past Medical History Hypertension Hyperlipidemia Heart failure with reduced ejection fraction  CVA in 2016  Past Surgical History Past Surgical History:  Procedure Laterality Date   COLONOSCOPY     INGUINAL HERNIA REPAIR Left    right hand surgery      Social History  Living Situation: Lives with daughter and her family 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   OBJECTIVE:   Physical Exam: Blood pressure (!) 169/109, pulse 69, temperature 97.8 F (36.6 C), temperature source Axillary, resp. rate 18, height 6' 1 (1.854 m), weight 97.5 kg, SpO2 95%.  Constitutional: well-appearing, well-nourished, in no acute distress HENT: normocephalic atraumatic, mucous membranes moist Eyes: conjunctiva non-erythematous, PERRL, no scleral icterus Cardiovascular: regular rate and rhythm, no m/r/g Pulmonary/Chest: Tachypneic with  normal work of breathing on room air, lungs clear to auscultation bilaterally Abdominal: soft, non-tender, non-distended, bowel sounds normal MSK: normal bulk and tone Neurological: alert & oriented x3, able to lift himself in bed using his arm rails Skin: warm and dry Extremities: no edema or cyanosis; peripheral pulses intact Psych: normal mood and affect, thought content normal  Labs: CBC    Component Value Date/Time   WBC 7.6 05/07/2024 1346   RBC 4.63 05/07/2024 1346   HGB 15.0 05/07/2024 1407   HGB 14.3 12/22/2021 1535   HCT 44.0 05/07/2024 1407   HCT 41.7 12/22/2021 1535   PLT 193 05/07/2024 1346   PLT 293 12/22/2021 1535   MCV 85.7 05/07/2024 1346   MCV 94 12/22/2021 1535   MCH 31.1 05/07/2024 1346   MCHC 36.3 (H) 05/07/2024 1346   RDW 11.9 05/07/2024 1346   RDW 12.0 12/22/2021 1535     CMP     Component Value Date/Time   NA 115 (LL) 05/07/2024 1407   NA 137 08/22/2023 1633   K 4.2 05/07/2024 1407   CL 82 (L) 05/07/2024 1407   CO2 18 (L) 05/07/2024 1346   GLUCOSE 120 (H) 05/07/2024 1407   BUN 17 05/07/2024 1407   BUN 12 08/22/2023 1633   CREATININE 1.00 05/07/2024 1407   CALCIUM  8.4 (L) 05/07/2024 1346   PROT 6.5 05/07/2024 1346   PROT 6.9 12/22/2021 1535   ALBUMIN 3.7 05/07/2024 1346   ALBUMIN 4.6 12/22/2021 1535   AST 55 (H) 05/07/2024 1346   ALT 63 (H) 05/07/2024 1346   ALKPHOS 46 05/07/2024 1346   BILITOT 1.1 05/07/2024 1346   BILITOT 0.5 12/22/2021 1535   GFRNONAA >60 05/07/2024 1346   GFRAA 76 09/04/2020 1648    Imaging: CT ABDOMEN PELVIS W CONTRAST Result Date: 05/07/2024 CLINICAL DATA:  Epigastric pain EXAM: CT ABDOMEN AND PELVIS WITH CONTRAST TECHNIQUE: Multidetector CT imaging of the abdomen and pelvis was performed using the standard protocol following bolus administration of intravenous contrast. RADIATION DOSE REDUCTION: This exam was performed according to the departmental dose-optimization program which includes automated exposure control,  adjustment of the mA and/or kV according to patient size and/or use of iterative reconstruction technique. CONTRAST:  75mL OMNIPAQUE  IOHEXOL  350 MG/ML SOLN COMPARISON:  12/21/2022, 08/08/2019 FINDINGS: Lower chest: Elevated right hemidiaphragm, with compressive atelectasis at the right lung base. No acute pleural or parenchymal lung disease. Hepatobiliary: No focal liver abnormality is seen. No gallstones, gallbladder wall thickening, or biliary dilatation. Pancreas: There is mild peripancreatic fat stranding along the dorsal margin of the pancreatic body, reference image 27/3, consistent with mild acute uncomplicated pancreatitis. No pancreatic duct dilation. No fluid collection or pseudocyst. Spleen: Normal in size without focal abnormality. Adrenals/Urinary Tract:  Bilateral subcentimeter renal cortical cysts do not require specific imaging follow-up. Otherwise the kidneys enhance normally. Extrarenal pelvis is again noted within the left kidney. No urinary tract calculi or obstructive uropathy. There is mild diffuse bladder wall thickening and trabeculations, consistent with chronic bladder outlet obstruction. No filling defects. The adrenals are unremarkable. Stomach/Bowel: No bowel obstruction or ileus. The appendix, if still present, is not well visualized. Diffuse colonic diverticulosis without diverticulitis. Small hiatal hernia. No bowel wall thickening or inflammatory change. Vascular/Lymphatic: No significant vascular findings are present. No enlarged abdominal or pelvic lymph nodes. Reproductive: Prostate is unremarkable. Other: Trace pelvic free fluid. No free intraperitoneal gas. Small fat containing umbilical hernia and moderate fat containing left inguinal hernia. No bowel herniation. Musculoskeletal: No acute or destructive bony abnormalities. Reconstructed images demonstrate no additional findings. IMPRESSION: 1. Mild peripancreatic fat stranding consistent with acute uncomplicated pancreatitis. No  fluid collection, pseudocyst, or abscess. 2. Trace pelvic free fluid, likely reactive. 3. Diffuse colonic diverticulosis without diverticulitis. 4. Mild diffuse bladder wall thickening and trabeculations, most consistent with sequela of chronic bladder outlet obstruction. Electronically Signed   By: Ozell Daring M.D.   On: 05/07/2024 16:11   DG Chest 2 View Result Date: 05/07/2024 EXAM: 2 VIEW(S) XRAY OF THE CHEST 05/07/2024 02:09:00 PM COMPARISON: 05/05/2024 CLINICAL HISTORY: Chest pain. Per triage: Pt c.o epigastric pain since Friday with some n/v. Denies diarrhea or SOB. FINDINGS: LUNGS AND PLEURA: Bibasilar atelectasis. No pulmonary edema. No pleural effusion. No pneumothorax. HEART AND MEDIASTINUM: Mild cardiomegaly. No acute abnormality of the cardiac and mediastinal silhouettes. No congestive heart failure. BONES AND SOFT TISSUES: Multilevel thoracic osteophytosis. No acute osseous abnormality. VASCULATURE: Tortuous aorta. LIMITATIONS/ARTIFACTS: Lateral view degraded by patient arm position, not raised above the head. IMPRESSION: 1. No acute findings. 2. Mild cardiomegaly without congestive heart failure. Electronically signed by: Rockey Kilts MD 05/07/2024 02:40 PM EDT RP Workstation: [id number]    EKG: personally reviewed my interpretation is sinus bradycardia, similar to previous EKGs.   ASSESSMENT & PLAN:   Assessment & Plan by Problem: Principal Problem:   Hyponatremia Active Problems:   Benign essential HTN   Elevated LDL cholesterol level   Chronic systolic heart failure (HCC)   GERD (gastroesophageal reflux disease)   Edward [name] is a male living with a history of CVA in 2016, heart failure with improved ejection fraction, hypertension, hyperlipidemia who presented with abdominal pain and was admitted for hyponatremia and acute pancreatitis on hospital day 0  # Severe hyponatremia Sodium found to be 115 today in the emergency department, last sodium was 125 checked 2 days ago.   Unclear what causes acute drop, besides the initiation of vomiting which is a likely sequelae of his acute pancreatitis which is discussed below.  Serum osmolality is low at 245, urine sodium currently pending.  Leading etiology is hypovolemic given that he has been vomiting, and has not had much p.o. intake.  He is on spironolactone , which we will hold for now.  Thankfully, he is alert and oriented and does not have any neurological deficits.  Does not appear encephalopathic.  We will replete with NS at 100 cc/hr and not with bolus to avoid rapid correction of sodium and osmotic demyelination syndrome.  Plan: - Start NaCl at 100 cc/h - Every 4 hour BMPs - Goal sodium 120-121 in the next 24 hours - Hold spironolactone  - Follow-up urine electrolytes, and urine osmolality  #Acute pancreatitis Presenting with midepigastric abdominal pain and CT imaging findings of mild peripancreatic fat  stranding without duct dilation, fluid collection, or pseudocyst. No leukocytosis. Etiology currently unclear, with no evidence of gallstones on CT scan, and no history of binge drinking alcohol.  Most recent new medication is spironolactone , this is unlikely to have caused pancreatitis as he has been taking this for several months.  Checking a lipid panel to rule out hypertriglyceridemia as a cause.  In the meanwhile, we will treat with fluids, keep n.p.o. for now.  Plan: - Start NaCl 100 cc/h - Lipase pending - Keep n.p.o., can advance diet tomorrow - Dilaudid  0.5 mg every 4 hours as needed for pain - Zofran  4 mg every 6 hours as needed for nausea  #Urinary tract infection Patient does have symptoms of increased urinary frequency but denies dysuria.  UA shows positive nitrites with pyuria and bacteriuria.  Will treat with ceftriaxone , urine culture has been collected and will be sent off. - Continue ceftriaxone  2g IV daily - Urine culture pending  #SOB Patient endorses shortness of breath and is tachypneic on  exam.  Lungs are CTAB.  No leukocytosis is present.  CXR without acute findings.  Mild cardiomegaly without congestive heart failure.  Tachypnea is likely in the setting of acute pain.  Low concern for respiratory infection at this time as flu/COVID is negative. - Continue to monitor  #Hx of CVA  Ischemic CVA in 2016 s/p tpa administration. Continuing his aspirin  and statin.  He should likely be on a higher dose of his Crestor  for secondary ASCVD prevention.  Consider outpatient follow-up. - Continue home ASA 81 mg daily - Continue home Crestor  10 mg daily  #Nonischemic cardiomyopathy #Combined systolic and diastolic heart failure Most recent echo on 09/30/2023 showing improvement of LVEF to 45-50%, grade 1 diastolic dysfunction.  Consider restarting ACEi/ARB and SGLT2i in the outpatient setting after resolution of UTI. - Start metoprolol  succinate 25 mg daily tomorrow  #Asymptomatic aortic aneurysm Follows with Dr. Shlomo with cardiology.  Chest CT in 12/21/2022 showed a 4 cm ascending aorta.  Will require frequent outpatient monitoring with his cardiologist.  #Sinus bradycardia Stable, no symptoms at this time.  During exam, the patient was with normal rate.  Will continue to monitor.  #Elevated Liver Enzymes Hepatic pattern, mildly elevated AST/ALT.  Likely in the setting of acute pancreatitis, no liver pathology seen on CT scan.  Interestingly no duct dilatation or gallstones. - Continue to monitor  #Hypertension Restarting his beta-blocker tomorrow.  Holding spironolactone  in the setting of hyponatremia.  Not on an ACE or an ARB, can consider initiating this. - Start metoprolol  succinate 25 mg daily tomorrow  #GERD Continuing his omeprazole  in the form of pantoprazole . - Pantoprazole  40 mg daily  #BPH - Continue tamsulosin  0.4 mg daily  Best practice: Diet: Normal VTE: Enoxaparin  IVF: NS,100cc/hr Code: Full  Disposition planning: Prior to Admission Living Arrangement:  Home Anticipated Discharge Location: Home  Dispo: Admit patient to Inpatient with expected length of stay greater than 2 midnights.  Signed: Melven Stockard, MD [facility]  [id number] 05/07/2024, 6:53 PM  Please contact IM Residency On-Call Pager at: [phone] or [phone].

## 2024-05-08 DIAGNOSIS — Z79899 Other long term (current) drug therapy: Secondary | ICD-10-CM

## 2024-05-08 DIAGNOSIS — B9620 Unspecified Escherichia coli [E. coli] as the cause of diseases classified elsewhere: Secondary | ICD-10-CM

## 2024-05-08 DIAGNOSIS — K219 Gastro-esophageal reflux disease without esophagitis: Secondary | ICD-10-CM

## 2024-05-08 DIAGNOSIS — I110 Hypertensive heart disease with heart failure: Secondary | ICD-10-CM

## 2024-05-08 DIAGNOSIS — I5040 Unspecified combined systolic (congestive) and diastolic (congestive) heart failure: Secondary | ICD-10-CM

## 2024-05-08 DIAGNOSIS — K859 Acute pancreatitis without necrosis or infection, unspecified: Secondary | ICD-10-CM

## 2024-05-08 DIAGNOSIS — R7401 Elevation of levels of liver transaminase levels: Secondary | ICD-10-CM

## 2024-05-08 DIAGNOSIS — N390 Urinary tract infection, site not specified: Secondary | ICD-10-CM

## 2024-05-08 DIAGNOSIS — N400 Enlarged prostate without lower urinary tract symptoms: Secondary | ICD-10-CM

## 2024-05-08 DIAGNOSIS — F419 Anxiety disorder, unspecified: Secondary | ICD-10-CM

## 2024-05-08 DIAGNOSIS — E871 Hypo-osmolality and hyponatremia: Secondary | ICD-10-CM

## 2024-05-08 DIAGNOSIS — Z8673 Personal history of transient ischemic attack (TIA), and cerebral infarction without residual deficits: Secondary | ICD-10-CM

## 2024-05-08 LAB — BASIC METABOLIC PANEL WITH GFR
Anion gap: 14 (ref 5–15)
Anion gap: 11 (ref 5–15)
Anion gap: 11 (ref 5–15)
Anion gap: 11 (ref 5–15)
Anion gap: 11 (ref 5–15)
BUN: 10 mg/dL (ref 8–23)
BUN: 12 mg/dL (ref 8–23)
BUN: 13 mg/dL (ref 8–23)
BUN: 13 mg/dL (ref 8–23)
BUN: 14 mg/dL (ref 8–23)
CO2: 22 mmol/L (ref 22–32)
CO2: 22 mmol/L (ref 22–32)
CO2: 22 mmol/L (ref 22–32)
CO2: 22 mmol/L (ref 22–32)
CO2: 23 mmol/L (ref 22–32)
Calcium: 8.3 mg/dL — ABNORMAL LOW (ref 8.9–10.3)
Calcium: 7.9 mg/dL — ABNORMAL LOW (ref 8.9–10.3)
Calcium: 7.5 mg/dL — ABNORMAL LOW (ref 8.9–10.3)
Calcium: 7.5 mg/dL — ABNORMAL LOW (ref 8.9–10.3)
Calcium: 7.7 mg/dL — ABNORMAL LOW (ref 8.9–10.3)
Chloride: 80 mmol/L — ABNORMAL LOW (ref 98–111)
Chloride: 83 mmol/L — ABNORMAL LOW (ref 98–111)
Chloride: 85 mmol/L — ABNORMAL LOW (ref 98–111)
Chloride: 82 mmol/L — ABNORMAL LOW (ref 98–111)
Chloride: 83 mmol/L — ABNORMAL LOW (ref 98–111)
Creatinine, Ser: 0.9 mg/dL (ref 0.61–1.24)
Creatinine, Ser: 0.87 mg/dL (ref 0.61–1.24)
Creatinine, Ser: 0.86 mg/dL (ref 0.61–1.24)
Creatinine, Ser: 0.9 mg/dL (ref 0.61–1.24)
Creatinine, Ser: 0.97 mg/dL (ref 0.61–1.24)
GFR, Estimated: >60 mL/min (ref >60)
GFR, Estimated: >60 mL/min (ref >60)
GFR, Estimated: >60 mL/min (ref >60)
GFR, Estimated: >60 mL/min (ref >60)
GFR, Estimated: >60 mL/min (ref >60)
Glucose, Bld: 120 mg/dL — ABNORMAL HIGH (ref 70–99)
Glucose, Bld: 117 mg/dL — ABNORMAL HIGH (ref 70–99)
Glucose, Bld: 104 mg/dL — ABNORMAL HIGH (ref 70–99)
Glucose, Bld: 110 mg/dL — ABNORMAL HIGH (ref 70–99)
Glucose, Bld: 104 mg/dL — ABNORMAL HIGH (ref 70–99)
Potassium: 3.6 mmol/L (ref 3.5–5.1)
Potassium: 4 mmol/L (ref 3.5–5.1)
Potassium: 3.8 mmol/L (ref 3.5–5.1)
Potassium: 3.8 mmol/L (ref 3.5–5.1)
Potassium: 4.3 mmol/L (ref 3.5–5.1)
Sodium: 116 mmol/L — CL (ref 135–145)
Sodium: 116 mmol/L — CL (ref 135–145)
Sodium: 118 mmol/L — CL (ref 135–145)
Sodium: 115 mmol/L — CL (ref 135–145)
Sodium: 117 mmol/L — CL (ref 135–145)

## 2024-05-08 LAB — CBC
HCT: 35.6 % — ABNORMAL LOW (ref 39.0–52.0)
Hemoglobin: 13.1 g/dL (ref 13.0–17.0)
MCH: 31.4 pg (ref 26.0–34.0)
MCHC: 36.8 g/dL — ABNORMAL HIGH (ref 30.0–36.0)
MCV: 85.4 fL (ref 80.0–100.0)
Platelets: 172 K/uL (ref 150–400)
RBC: 4.17 MIL/uL — ABNORMAL LOW (ref 4.22–5.81)
RDW: 11.9 % (ref 11.5–15.5)
WBC: 6.6 K/uL (ref 4.0–10.5)
nRBC: 0 % (ref 0.0–0.2)

## 2024-05-08 LAB — MRSA NEXT GEN BY PCR, NASAL: MRSA by PCR Next Gen: NOT DETECTED

## 2024-05-08 MED ORDER — SODIUM CHLORIDE 0.9 % IV BOLUS
500.0000 mL | Freq: Once | INTRAVENOUS | Status: AC
  Administered 2024-05-08: 500 mL via INTRAVENOUS

## 2024-05-08 MED ORDER — OXYCODONE HCL 5 MG PO TABS: 5.0000 mg | ORAL_TABLET | Freq: Four times a day (QID) | ORAL | Status: DC | PRN

## 2024-05-08 MED ORDER — ORAL CARE MOUTH RINSE: 15.0000 mL | OROMUCOSAL | Status: DC | PRN

## 2024-05-08 MED ORDER — ACETAMINOPHEN 325 MG PO TABS: 650.0000 mg | ORAL_TABLET | Freq: Four times a day (QID) | ORAL | Status: DC | PRN

## 2024-05-08 MED ORDER — HYDROXYZINE HCL 25 MG PO TABS
25.0000 mg | ORAL_TABLET | Freq: Once | ORAL | Status: AC
  Administered 2024-05-08: 25 mg via ORAL
  Filled 2024-05-08: qty 1

## 2024-05-08 NOTE — Plan of Care (Signed)

## 2024-05-08 NOTE — Progress Notes (Addendum)
 Patient ID: Edward [name], male   DOB: 1950/12/02, 73 y.o.   MRN: [id number]   HD#1 SUBJECTIVE:  Patient Summary: Edward [name] is a 73 y.o. with a pertinent PMH of Nonischemic Cardiomyopathy, CVA in 2016, mild ascending aortic aneurysm, HTN, HLD, who presented with abdominal pain and admitted for severe hyponatremia and acute pancreatitis.   Overnight Events: NAEON  Interim History: Saw patient today in bed asleep. Daughter at bedside who reports he's just been tired after not sleeping for a long time.  OBJECTIVE:  Vital Signs: Vitals:   05/08/24 0344 05/08/24 0430 05/08/24 0522 05/08/24 0530  BP:    (!) 116/90  Pulse: 96 76  79  Resp: (!) 24 15  18   Temp:   98 F (36.7 C)   TempSrc:   Oral   SpO2: 99% 98%  100%  Weight:      Height:       Supplemental O2: Room Air SpO2: 100 %  Filed Weights   05/07/24 1416  Weight: 97.5 kg     Intake/Output Summary (Last 24 hours) at 05/08/2024 9177 Last data filed at 05/08/2024 9480 Gross per 24 hour  Intake 1499.99 ml  Output 900 ml  Net 599.99 ml   Net IO Since Admission: 599.99 mL [05/08/24 0822]  Physical Exam: Physical Exam Constitutional:      General: He is not in acute distress.    Appearance: He is not ill-appearing.     Comments: Asleep for exam; appears euvolemic  Cardiovascular:     Rate and Rhythm: Normal rate and regular rhythm.     Heart sounds: Normal heart sounds. No murmur heard. Pulmonary:     Effort: Pulmonary effort is normal. No tachypnea or respiratory distress.     Breath sounds: Normal breath sounds.     Comments: Anterior lung fields CTAB Abdominal:     General: Bowel sounds are normal.  Musculoskeletal:     Right lower leg: No edema.     Left lower leg: No edema.  Skin:    General: Skin is warm and dry.     Capillary Refill: Capillary refill takes less than 2 seconds.     Patient Lines/Drains/Airways Status     Active Line/Drains/Airways     Name Placement date Placement time Site Days    Peripheral IV 05/07/24 20 G 1 Right Antecubital 05/07/24  1424  Antecubital  1            Pertinent labs and imaging:  BNP 195 Triglycerides 90 AST 55, ALT 63 Serum osmolality 245 Urine osm 537, Urine NA 86 UA: leukocytes, nitrites positive. Many bacteria found RPP negative    Latest Ref Rng & Units 05/08/2024    4:43 AM 05/07/2024    2:07 PM 05/07/2024    1:46 PM  CBC  WBC 4.0 - 10.5 K/uL 6.6   7.6   Hemoglobin 13.0 - 17.0 g/dL 86.8  84.9  85.5   Hematocrit 39.0 - 52.0 % 35.6  44.0  39.7   Platelets 150 - 400 K/uL 172   193        Latest Ref Rng & Units 05/08/2024    5:45 AM 05/08/2024   12:46 AM 05/07/2024    8:06 PM  CMP  Glucose 70 - 99 mg/dL 882  879  888   BUN 8 - 23 mg/dL 12  10  11    Creatinine 0.61 - 1.24 mg/dL 9.12  9.09  9.05   Sodium 135 - 145 mmol/L 116  116  116   Potassium 3.5 - 5.1 mmol/L 4.0  3.6  3.7   Chloride 98 - 111 mmol/L 83  80  82   CO2 22 - 32 mmol/L 22  22  21    Calcium  8.9 - 10.3 mg/dL 7.9  8.3  8.0     CT ABDOMEN PELVIS W CONTRAST Result Date: 05/07/2024 CLINICAL DATA:  Epigastric pain EXAM: CT ABDOMEN AND PELVIS WITH CONTRAST TECHNIQUE: Multidetector CT imaging of the abdomen and pelvis was performed using the standard protocol following bolus administration of intravenous contrast. RADIATION DOSE REDUCTION: This exam was performed according to the departmental dose-optimization program which includes automated exposure control, adjustment of the mA and/or kV according to patient size and/or use of iterative reconstruction technique. CONTRAST:  75mL OMNIPAQUE  IOHEXOL  350 MG/ML SOLN COMPARISON:  12/21/2022, 08/08/2019 FINDINGS: Lower chest: Elevated right hemidiaphragm, with compressive atelectasis at the right lung base. No acute pleural or parenchymal lung disease. Hepatobiliary: No focal liver abnormality is seen. No gallstones, gallbladder wall thickening, or biliary dilatation. Pancreas: There is mild peripancreatic fat stranding along the dorsal  margin of the pancreatic body, reference image 27/3, consistent with mild acute uncomplicated pancreatitis. No pancreatic duct dilation. No fluid collection or pseudocyst. Spleen: Normal in size without focal abnormality. Adrenals/Urinary Tract: Bilateral subcentimeter renal cortical cysts do not require specific imaging follow-up. Otherwise the kidneys enhance normally. Extrarenal pelvis is again noted within the left kidney. No urinary tract calculi or obstructive uropathy. There is mild diffuse bladder wall thickening and trabeculations, consistent with chronic bladder outlet obstruction. No filling defects. The adrenals are unremarkable. Stomach/Bowel: No bowel obstruction or ileus. The appendix, if still present, is not well visualized. Diffuse colonic diverticulosis without diverticulitis. Small hiatal hernia. No bowel wall thickening or inflammatory change. Vascular/Lymphatic: No significant vascular findings are present. No enlarged abdominal or pelvic lymph nodes. Reproductive: Prostate is unremarkable. Other: Trace pelvic free fluid. No free intraperitoneal gas. Small fat containing umbilical hernia and moderate fat containing left inguinal hernia. No bowel herniation. Musculoskeletal: No acute or destructive bony abnormalities. Reconstructed images demonstrate no additional findings. IMPRESSION: 1. Mild peripancreatic fat stranding consistent with acute uncomplicated pancreatitis. No fluid collection, pseudocyst, or abscess. 2. Trace pelvic free fluid, likely reactive. 3. Diffuse colonic diverticulosis without diverticulitis. 4. Mild diffuse bladder wall thickening and trabeculations, most consistent with sequela of chronic bladder outlet obstruction. Electronically Signed   By: Ozell Daring M.D.   On: 05/07/2024 16:11   DG Chest 2 View Result Date: 05/07/2024 EXAM: 2 VIEW(S) XRAY OF THE CHEST 05/07/2024 02:09:00 PM COMPARISON: 05/05/2024 CLINICAL HISTORY: Chest pain. Per triage: Pt c.o epigastric  pain since Friday with some n/v. Denies diarrhea or SOB. FINDINGS: LUNGS AND PLEURA: Bibasilar atelectasis. No pulmonary edema. No pleural effusion. No pneumothorax. HEART AND MEDIASTINUM: Mild cardiomegaly. No acute abnormality of the cardiac and mediastinal silhouettes. No congestive heart failure. BONES AND SOFT TISSUES: Multilevel thoracic osteophytosis. No acute osseous abnormality. VASCULATURE: Tortuous aorta. LIMITATIONS/ARTIFACTS: Lateral view degraded by patient arm position, not raised above the head. IMPRESSION: 1. No acute findings. 2. Mild cardiomegaly without congestive heart failure. Electronically signed by: Rockey Kilts MD 05/07/2024 02:40 PM EDT RP Workstation: [id number]    ASSESSMENT/PLAN:  Assessment: Principal Problem:   Hyponatremia Active Problems:   Benign essential HTN   Elevated LDL cholesterol level   Chronic systolic heart failure (HCC)   GERD (gastroesophageal reflux disease)   Plan: #Severe Hyponatremia Pt presented to the ED last night and was found to have  serum sodium of 115. Previous sodium was 125 checked 3 days ago. He was given 3 NS bolus of 500cc between presentation yesterday and evaluation this morning. He was also started on maintenance fluids. Hyponatremia was worked up in the ED with other relevant lab values as follows: CL 82, serum osmolality 245, urine osmolality 537, urine sodium 86. On exam, he appears euvolemic. Euvolemic status with high urine sodium and osmolality suggests SIADH as a potential cause. Etiology of SIADH undetermined at this time as he has not had recent trauma, stroke, CNS infection/pathology. Before fluid boluses, he may have been hypovolemic, which would indicate renal salt wasting. Will reevaluate after fluid restriction.  - Stop IV fluids - Start fluid restriction (1200 ml) - BMP q4h - Goal sodium >120 in next 24 hours - Follow up urine electrolytes and osmolality - Continuous cardiac telemetry  #Acute Uncomplicated  Pancreatitis PT presented to ED with epigastric pain and one episode of profuse vomiting. He was started on fluids and bowel rest. He graduated to clear liquid diet this morning. CT imaging found mild peripancreatic fat stranding without duct dilation, fluid collection, or pseudocyst. No leukocytosis. Etiology uncertain. He has no history of binge drinking alcohol, but endorses some nightly alcohol. There is also no evidence of gallstones on CT. Triglycerides 90, so this is an unlikely cause. He was being treated with fluids, but as his pain is currently under control, IV fluids will be stopped in the setting of suspected SIADH hyponatremia.  - Stop IV fluids - IV Dilaudid  0.5 mg PRN q4h - IV Zofran  4mg  q6h PRN for nausea  #UTI Pt reported increased frequency of urination over the past few days, but no dysuria. Urinalysis was positive for leukocyte esterase, nitrites, and had many bacteria under microscopy. Urine culture showing E. Coli, susceptibilities pending. He received one dose of ceftriaxone  in the ED. This treatment regimen can be continued. - continue Ceftriaxone  2 g IV daily - culture susceptibilities pending  #Anxiety Pt reported anxiety at being hospitalized. No reported history of anxiety. - Hydroxyzine  25 mg PRN anxiety  #Hx of CVA  Ischemic CVA in 2016 s/p tpa administration. Continuing his aspirin  and statin. He should likely be on a higher dose of his Crestor  for secondary ASCVD prevention. Consider outpatient follow-up. - Continue home ASA 81 mg daily - Continue home Crestor  10 mg daily  #Nonischemic cardiomyopathy #Combined systolic and diastolic heart failure Most recent echo on 09/30/2023 showing improvement of LVEF to 45-50%, grade 1 diastolic dysfunction.  Consider restarting ACEi/ARB and SGLT2i in the outpatient setting after resolution of UTI. - Start metoprolol  succinate 25 mg daily   #Asymptomatic aortic aneurysm Follows with Dr. Shlomo with cardiology.  Chest CT  in 12/21/2022 showed a 4 cm ascending aorta.  Will require frequent outpatient monitoring with his cardiologist.  #Sinus bradycardia Stable, no symptoms at this time.  During exam, the patient was with normal rate.  Will continue to monitor.  #Elevated Liver Enzymes Hepatic pattern, mildly elevated AST/ALT.  Likely in the setting of acute pancreatitis, no liver pathology seen on CT scan.  Interestingly no duct dilatation or gallstones. - Continue to monitor  #Hypertension Restarting his beta-blocker tomorrow.  Holding spironolactone  in the setting of hyponatremia.  Not on an ACE or an ARB, can consider initiating this. - Start metoprolol  succinate 25 mg daily tomorrow  #GERD Continuing his omeprazole  in the form of pantoprazole . - Pantoprazole  40 mg daily  #BPH - Continue tamsulosin  0.4 mg daily - Continue finasteride   5 mg daily  Best Practice: Diet: Soft with fluid restriction (1200cc) IVF: Fluids: None VTE: enoxaparin  (LOVENOX ) injection 40 mg Start: 05/07/24 1800 Code: Full  Disposition planning: Therapy Recs: Pending, DME: other pending Family Contact: Wife, Daughter, at bedside DISPO: Anticipated discharge pending improvement in pain and sodium levels.  Signature:  Luie Siegel [facility]  8:22 AM, 05/08/2024  On Call pager [phone]  Attestation for Student Documentation:  I personally was present and re-performed the history, physical exam and medical decision-making activities of this service and have verified that the service and findings are accurately documented in the student's note.  Waymond Cart, MD 05/08/2024, 1:32 PM

## 2024-05-09 DIAGNOSIS — R000 Tachycardia, unspecified: Secondary | ICD-10-CM

## 2024-05-09 DIAGNOSIS — Z7982 Long term (current) use of aspirin: Secondary | ICD-10-CM

## 2024-05-09 DIAGNOSIS — Z792 Long term (current) use of antibiotics: Secondary | ICD-10-CM

## 2024-05-09 LAB — BASIC METABOLIC PANEL WITH GFR
Anion gap: 8 (ref 5–15)
Anion gap: 10 (ref 5–15)
Anion gap: 11 (ref 5–15)
Anion gap: 10 (ref 5–15)
BUN: 12 mg/dL (ref 8–23)
BUN: 13 mg/dL (ref 8–23)
BUN: 13 mg/dL (ref 8–23)
BUN: 13 mg/dL (ref 8–23)
CO2: 24 mmol/L (ref 22–32)
CO2: 24 mmol/L (ref 22–32)
CO2: 24 mmol/L (ref 22–32)
CO2: 24 mmol/L (ref 22–32)
Calcium: 7.8 mg/dL — ABNORMAL LOW (ref 8.9–10.3)
Calcium: 8.3 mg/dL — ABNORMAL LOW (ref 8.9–10.3)
Calcium: 8 mg/dL — ABNORMAL LOW (ref 8.9–10.3)
Calcium: 7.8 mg/dL — ABNORMAL LOW (ref 8.9–10.3)
Chloride: 85 mmol/L — ABNORMAL LOW (ref 98–111)
Chloride: 85 mmol/L — ABNORMAL LOW (ref 98–111)
Chloride: 86 mmol/L — ABNORMAL LOW (ref 98–111)
Chloride: 88 mmol/L — ABNORMAL LOW (ref 98–111)
Creatinine, Ser: 0.94 mg/dL (ref 0.61–1.24)
Creatinine, Ser: 1.09 mg/dL (ref 0.61–1.24)
Creatinine, Ser: 0.99 mg/dL (ref 0.61–1.24)
Creatinine, Ser: 1.04 mg/dL (ref 0.61–1.24)
GFR, Estimated: >60 mL/min (ref >60)
GFR, Estimated: >60 mL/min (ref >60)
GFR, Estimated: >60 mL/min (ref >60)
GFR, Estimated: >60 mL/min (ref >60)
Glucose, Bld: 89 mg/dL (ref 70–99)
Glucose, Bld: 112 mg/dL — ABNORMAL HIGH (ref 70–99)
Glucose, Bld: 89 mg/dL (ref 70–99)
Glucose, Bld: 106 mg/dL — ABNORMAL HIGH (ref 70–99)
Potassium: 3.7 mmol/L (ref 3.5–5.1)
Potassium: 3.6 mmol/L (ref 3.5–5.1)
Potassium: 3.8 mmol/L (ref 3.5–5.1)
Potassium: 3.6 mmol/L (ref 3.5–5.1)
Sodium: 117 mmol/L — CL (ref 135–145)
Sodium: 119 mmol/L — CL (ref 135–145)
Sodium: 121 mmol/L — ABNORMAL LOW (ref 135–145)
Sodium: 122 mmol/L — ABNORMAL LOW (ref 135–145)

## 2024-05-09 LAB — CBC
HCT: 34.8 % — ABNORMAL LOW (ref 39.0–52.0)
Hemoglobin: 12.7 g/dL — ABNORMAL LOW (ref 13.0–17.0)
MCH: 30.7 pg (ref 26.0–34.0)
MCHC: 36.5 g/dL — ABNORMAL HIGH (ref 30.0–36.0)
MCV: 84.1 fL (ref 80.0–100.0)
Platelets: 145 K/uL — ABNORMAL LOW (ref 150–400)
RBC: 4.14 MIL/uL — ABNORMAL LOW (ref 4.22–5.81)
RDW: 11.8 % (ref 11.5–15.5)
WBC: 4.7 K/uL (ref 4.0–10.5)
nRBC: 0 % (ref 0.0–0.2)

## 2024-05-09 LAB — CORTISOL-AM, BLOOD
Cortisol - AM: 15.8 ug/dL (ref 6.7–22.6)
Cortisol - AM: 21.8 ug/dL (ref 6.7–22.6)

## 2024-05-09 LAB — URINE CULTURE: Culture: >=100000 — AB

## 2024-05-09 LAB — TSH: TSH: 0.76 u[IU]/mL (ref 0.350–4.500)

## 2024-05-09 MED ORDER — SODIUM CHLORIDE 1 G PO TABS
2.0000 g | ORAL_TABLET | Freq: Two times a day (BID) | ORAL | Status: DC
Start: 2024-05-09 — End: 2024-05-11
  Administered 2024-05-09 – 2024-05-11 (×5): 2 g via ORAL
  Filled 2024-05-09 (×5): qty 2

## 2024-05-09 NOTE — TOC CM/SW Note (Signed)
 Transition of Care [facility]) - Inpatient Brief Assessment   Patient Details  Name: Edward [name] MRN: [id number] Date of Birth: Mar 12, 1951  Transition of Care [facility]) CM/SW Contact:    Lauraine FORBES Saa, LCSWA Phone Number: 05/09/2024, 8:59 AM   Clinical Narrative:  8:59 AM Per chart review, patient resides at home with spouse, child(ren), and other relative(s). Patient has a PCP and insurance. Patient does not have SNF/HH/DME history. Patient's preferred pharmacy is [facility] [phone] [facility]. No TOC needs identified at this time. TOC will continue to follow and be available to assist.  Transition of Care Asessment: Insurance and Status: Insurance coverage has been reviewed Patient has primary care physician: Yes Home environment has been reviewed: Private Residence Prior level of function:: N/A Prior/Current Home Services: No current home services Social Drivers of Health Review: SDOH reviewed no interventions necessary Readmission risk has been reviewed: Yes (Currently Green 11%) Transition of care needs: no transition of care needs at this time

## 2024-05-09 NOTE — Progress Notes (Addendum)
 Patient ID: Edward [name], male   DOB: 05-18-1951, 73 y.o.   MRN: [id number]   HD#2 SUBJECTIVE:  Patient Summary: Edward [name] is a 73 y.o. with a pertinent PMH of Nonischemic Cardiomyopathy, CVA in 2016, mild ascending aortic aneurysm, HTN, HLD, who presented with abdominal pain and admitted for severe hyponatremia, acute pancreatitis, and UTI.   Overnight Events: NAEON  Interim History: Saw patient today sitting on bed. His abdominal pain has resolved. He was able to eat about 1/3 of his breakfast, noting that he generally does not complete meals. He is denying dysuria and urinary frequency at this time. When heart rate was increased, he did not note any chest pain or shortness of breath. Also denies orthopnea.   OBJECTIVE:  Vital Signs: Vitals:   05/09/24 0200 05/09/24 0400 05/09/24 0420 05/09/24 0600  BP:   115/65   Pulse:   74   Resp: 15 16 16 16   Temp:   98.2 F (36.8 C)   TempSrc:   Oral   SpO2:   97%   Weight:      Height:       Supplemental O2: Room Air SpO2: 97 %  Filed Weights   05/07/24 1416 05/08/24 1224  Weight: 97.5 kg 98.3 kg     Intake/Output Summary (Last 24 hours) at 05/09/2024 9290 Last data filed at 05/09/2024 0600 Gross per 24 hour  Intake 820 ml  Output --  Net 820 ml   Net IO Since Admission: 1,419.99 mL [05/09/24 0709]  Physical Exam: Physical Exam Constitutional:      General: He is not in acute distress.    Appearance: He is not ill-appearing.     Comments: Appears euvolemic  Cardiovascular:     Rate and Rhythm: Regular rhythm. Tachycardia present.     Heart sounds: Normal heart sounds. No murmur heard. Pulmonary:     Effort: Pulmonary effort is normal. No tachypnea or respiratory distress.     Breath sounds: Normal breath sounds.     Comments: Anterior lung fields CTAB Abdominal:     General: Bowel sounds are normal.     Tenderness: There is no abdominal tenderness.  Musculoskeletal:     Right lower leg: No edema.     Left lower leg: No  edema.  Skin:    General: Skin is warm and dry.     Capillary Refill: Capillary refill takes less than 2 seconds.     Patient Lines/Drains/Airways Status     Active Line/Drains/Airways     Name Placement date Placement time Site Days   Peripheral IV 05/07/24 20 G 1 Right Antecubital 05/07/24  1424  Antecubital  1            Pertinent labs and imaging:  Am cortisol 15.8    Latest Ref Rng & Units 05/09/2024    2:25 AM 05/08/2024    4:43 AM 05/07/2024    2:07 PM  CBC  WBC 4.0 - 10.5 K/uL 4.7  6.6    Hemoglobin 13.0 - 17.0 g/dL 87.2  86.8  84.9   Hematocrit 39.0 - 52.0 % 34.8  35.6  44.0   Platelets 150 - 400 K/uL 145  172         Latest Ref Rng & Units 05/09/2024    2:25 AM 05/08/2024    7:43 PM 05/08/2024    1:56 PM  CMP  Glucose 70 - 99 mg/dL 89  895  889   BUN 8 - 23 mg/dL 12  14  13   Creatinine 0.61 - 1.24 mg/dL 9.05  9.02  9.09   Sodium 135 - 145 mmol/L 117  117  115   Potassium 3.5 - 5.1 mmol/L 3.7  4.3  3.8   Chloride 98 - 111 mmol/L 85  83  82   CO2 22 - 32 mmol/L 24  23  22    Calcium  8.9 - 10.3 mg/dL 7.8  7.7  7.5     No results found.   ASSESSMENT/PLAN:  Assessment: Principal Problem:   Hyponatremia Active Problems:   Benign essential HTN   Elevated LDL cholesterol level   Chronic systolic heart failure (HCC)   GERD (gastroesophageal reflux disease)   Plan: #Severe Hyponatremia Pt presented to the ED and was found to have serum sodium of 115. This morning, sodium at 119. AM cortisol 21.8 at 8:35. On exam, he appears euvolemic. Currently suspect SIADH as primary cause of hyponatremia due to euvolemic appearance on exam and urine sodium 86. Etiology of SIADH undetermined at this time as he has not had recent trauma, stroke, CNS infection/pathology. Normal AM cortisol indicates cause of hyponatremia not due to adrenal insufficiency. BMP q6h for continuous sodium monitoring. Sodium tablets added to boost levels. - Stop IV fluids - Start fluid restriction  (1200 ml) - Sodium chloride  tablet 2g BID - BMP q4h - Goal sodium >120 in next 24 hours - Follow up urine electrolytes and osmolality - Continuous cardiac telemetry  #Tachycardia Pt found to have sustained tachycardia in the 120s and 130s that would jump to 140s and 150s unsustained. He was not having any symptoms of SOB while sitting, chest pain, or orthopnea. He did report some exertional dyspnea. EKG showed sinus rhythm with premature atrial complexes. Previous EKG similarly had premature atrial complexes, but with sinus bradycardia. Consider orthostatic vitals if problem persists. - Continue cardiac telemetry  #Acute Uncomplicated Pancreatitis PT presented to ED with epigastric pain and one episode of profuse vomiting. He was started on fluids and bowel rest. He was able to eat a small amount of solid food this morning. Today he reports that his abdominal pain has completely resolved. CT imaging found mild peripancreatic fat stranding without duct dilation, fluid collection, or pseudocyst. No leukocytosis. Etiology uncertain. He has no history of binge drinking alcohol, but endorses some nightly alcohol use. There is also no evidence of gallstones on CT. Triglycerides 90, so this is an unlikely cause. He was being treated with fluids, but as his pain is currently under control, IV fluids stopped in the setting of SIADH hyponatremia.  - IV Dilaudid  0.5 mg PRN q4h - IV Zofran  4mg  q6h PRN for nausea - Oxy 5 mg q6h PRN breakthrough pain  #UTI Pt reported increased frequency of urination over the past few days, but no dysuria. Urinalysis was positive for leukocyte esterase, nitrites, and had many bacteria under microscopy. Urine culture showing E. Coli, susceptible to all antibiotics tested. He has received 2 doses ceftriaxone . Can change to oral TMP-SMX.  - continue Ceftriaxone  2 g IV daily  #Hx of CVA  Ischemic CVA in 2016 s/p tpa administration. Continuing his aspirin  and statin. He should  likely be on a higher dose of his Crestor  for secondary ASCVD prevention. Consider outpatient follow-up. - Continue home ASA 81 mg daily - Continue home Crestor  10 mg daily  #Nonischemic cardiomyopathy #Combined systolic and diastolic heart failure Most recent echo on 09/30/2023 showing improvement of LVEF to 45-50%, grade 1 diastolic dysfunction.  Consider restarting ACEi/ARB  and SGLT2i in the outpatient setting after resolution of UTI. - Start metoprolol  succinate 25 mg daily   #Asymptomatic aortic aneurysm Follows with Dr. Shlomo with cardiology.  Chest CT in 12/21/2022 showed a 4 cm ascending aorta.  Will require frequent outpatient monitoring with his cardiologist.  #Sinus bradycardia Stable, no symptoms at this time.  During exam, the patient was with normal rate.  Will continue to monitor.  #Elevated Liver Enzymes Hepatic pattern, mildly elevated AST/ALT.  Likely in the setting of acute pancreatitis, no liver pathology seen on CT scan.  Interestingly no duct dilatation or gallstones. - Continue to monitor  #Hypertension Restarting his beta-blocker tomorrow.  Holding spironolactone  in the setting of hyponatremia.  Not on an ACE or an ARB, can consider initiating this. - Start metoprolol  succinate 25 mg daily tomorrow  #GERD Continuing his omeprazole  in the form of pantoprazole . - Pantoprazole  40 mg daily  #BPH - Continue tamsulosin  0.4 mg daily - Continue finasteride  5 mg daily  Best Practice: Diet: Soft with fluid restriction (1200cc) IVF: Fluids: None VTE: enoxaparin  (LOVENOX ) injection 40 mg Start: 05/07/24 1800 Code: Full  Disposition planning: Therapy Recs: Pending, DME: other pending Family Contact: Wife, Daughter, at bedside DISPO: Anticipated discharge pending improvement in pain and sodium levels.  Signature:  [name]  7:09 AM, 05/09/2024  On Call pager [phone]   Edward [name], Medical Student 05/09/2024, 7:09  AM Patient ID: Edward [name], male   DOB: 04-Jan-1951, 73 y.o.   MRN: [id number]  Attestation for Student Documentation:  I personally was present and re-performed the history, physical exam and medical decision-making activities of this service and have verified that the service and findings are accurately documented in the student's note.  Edward Scoggin, MD 05/09/2024, 2:33 PM

## 2024-05-10 LAB — BASIC METABOLIC PANEL WITH GFR
Anion gap: 10 (ref 5–15)
Anion gap: 8 (ref 5–15)
Anion gap: 8 (ref 5–15)
Anion gap: 8 (ref 5–15)
BUN: 13 mg/dL (ref 8–23)
BUN: 11 mg/dL (ref 8–23)
BUN: 13 mg/dL (ref 8–23)
BUN: 14 mg/dL (ref 8–23)
CO2: 25 mmol/L (ref 22–32)
CO2: 26 mmol/L (ref 22–32)
CO2: 26 mmol/L (ref 22–32)
CO2: 25 mmol/L (ref 22–32)
Calcium: 7.9 mg/dL — ABNORMAL LOW (ref 8.9–10.3)
Calcium: 8 mg/dL — ABNORMAL LOW (ref 8.9–10.3)
Calcium: 8 mg/dL — ABNORMAL LOW (ref 8.9–10.3)
Calcium: 7.8 mg/dL — ABNORMAL LOW (ref 8.9–10.3)
Chloride: 89 mmol/L — ABNORMAL LOW (ref 98–111)
Chloride: 92 mmol/L — ABNORMAL LOW (ref 98–111)
Chloride: 92 mmol/L — ABNORMAL LOW (ref 98–111)
Chloride: 94 mmol/L — ABNORMAL LOW (ref 98–111)
Creatinine, Ser: 1.07 mg/dL (ref 0.61–1.24)
Creatinine, Ser: 1.04 mg/dL (ref 0.61–1.24)
Creatinine, Ser: 1.11 mg/dL (ref 0.61–1.24)
Creatinine, Ser: 1.12 mg/dL (ref 0.61–1.24)
GFR, Estimated: >60 mL/min (ref >60)
GFR, Estimated: >60 mL/min (ref >60)
GFR, Estimated: >60 mL/min (ref >60)
GFR, Estimated: >60 mL/min (ref >60)
Glucose, Bld: 100 mg/dL — ABNORMAL HIGH (ref 70–99)
Glucose, Bld: 93 mg/dL (ref 70–99)
Glucose, Bld: 85 mg/dL (ref 70–99)
Glucose, Bld: 125 mg/dL — ABNORMAL HIGH (ref 70–99)
Potassium: 3.3 mmol/L — ABNORMAL LOW (ref 3.5–5.1)
Potassium: 3.2 mmol/L — ABNORMAL LOW (ref 3.5–5.1)
Potassium: 3.7 mmol/L (ref 3.5–5.1)
Potassium: 3.9 mmol/L (ref 3.5–5.1)
Sodium: 124 mmol/L — ABNORMAL LOW (ref 135–145)
Sodium: 126 mmol/L — ABNORMAL LOW (ref 135–145)
Sodium: 126 mmol/L — ABNORMAL LOW (ref 135–145)
Sodium: 127 mmol/L — ABNORMAL LOW (ref 135–145)

## 2024-05-10 LAB — CBC
HCT: 37.4 % — ABNORMAL LOW (ref 39.0–52.0)
Hemoglobin: 13.6 g/dL (ref 13.0–17.0)
MCH: 31 pg (ref 26.0–34.0)
MCHC: 36.4 g/dL — ABNORMAL HIGH (ref 30.0–36.0)
MCV: 85.2 fL (ref 80.0–100.0)
Platelets: 161 K/uL (ref 150–400)
RBC: 4.39 MIL/uL (ref 4.22–5.81)
RDW: 11.9 % (ref 11.5–15.5)
WBC: 6.3 K/uL (ref 4.0–10.5)
nRBC: 0 % (ref 0.0–0.2)

## 2024-05-10 MED ORDER — NITROFURANTOIN MONOHYD MACRO 100 MG PO CAPS
100.0000 mg | ORAL_CAPSULE | Freq: Two times a day (BID) | ORAL | Status: DC
  Administered 2024-05-10: 100 mg via ORAL
  Filled 2024-05-10: qty 1

## 2024-05-10 MED ORDER — NITROFURANTOIN MONOHYD MACRO 100 MG PO CAPS
100.0000 mg | ORAL_CAPSULE | Freq: Two times a day (BID) | ORAL | Status: AC
Start: 2024-05-10 — End: 2024-05-12
  Administered 2024-05-10 – 2024-05-12 (×4): 100 mg via ORAL
  Filled 2024-05-10 (×4): qty 1

## 2024-05-10 NOTE — Care Management Important Message (Signed)
 Important Message  Patient Details  Name: Edward [name] MRN: [id number] Date of Birth: Mar 20, 1951   Important Message Given:  Yes - Medicare IM     Claretta Deed 05/10/2024, 4:20 PM

## 2024-05-10 NOTE — Progress Notes (Signed)
 Patient ID: Edward [name], male   DOB: 03-12-1951, 73 y.o.   MRN: [id number]   HD#3 SUBJECTIVE:  Patient Summary: Edward [name] is a 73 y.o. with a pertinent PMH of Nonischemic Cardiomyopathy, CVA in 2016, mild ascending aortic aneurysm, HTN, HLD, who presented with abdominal pain and admitted for severe hyponatremia, acute pancreatitis, and UTI.   Overnight Events: NAEON  Interim History: Saw patient today sitting on bed. He is feeling well this morning. He feels like he needs to get up and move around more. He is happy with his progress back to normal sodium levels. He denies any more abdominal pain and was able to eat the entirety of his breakfast. Denies dysuria and urinary frequency.  OBJECTIVE:  Vital Signs: Vitals:   05/09/24 1932 05/09/24 2330 05/10/24 0247 05/10/24 0747  BP: 134/69 (!) 122/95 (!) 139/91 108/68  Pulse: 87 73 68 66  Resp: 20 18 18 16   Temp: 98 F (36.7 C) 98.2 F (36.8 C) 97.6 F (36.4 C) 98.9 F (37.2 C)  TempSrc: Oral Oral Oral Oral  SpO2: 97% 93% 96% 93%  Weight:      Height:       Supplemental O2: Room Air SpO2: 93 %  Filed Weights   05/07/24 1416 05/08/24 1224  Weight: 97.5 kg 98.3 kg     Intake/Output Summary (Last 24 hours) at 05/10/2024 1147 Last data filed at 05/10/2024 1018 Gross per 24 hour  Intake 600 ml  Output 500 ml  Net 100 ml   Net IO Since Admission: 1,879.99 mL [05/10/24 1147]  Physical Exam: Physical Exam Constitutional:      General: He is not in acute distress.    Appearance: He is well-developed. He is not ill-appearing.     Comments: Appears euvolemic  HENT:     Head: Normocephalic and atraumatic.  Eyes:     Extraocular Movements: Extraocular movements intact.     Pupils: Pupils are equal, round, and reactive to light.  Cardiovascular:     Rate and Rhythm: Normal rate and regular rhythm.     Heart sounds: Normal heart sounds. No murmur heard. Pulmonary:     Effort: Pulmonary effort is normal. No tachypnea or  respiratory distress.     Breath sounds: Normal breath sounds.     Comments: Anterior lung fields CTAB Abdominal:     General: Bowel sounds are normal.     Tenderness: There is no abdominal tenderness. There is no guarding or rebound.  Musculoskeletal:     Right lower leg: No edema.     Left lower leg: No edema.  Skin:    General: Skin is warm and dry.     Capillary Refill: Capillary refill takes less than 2 seconds.  Neurological:     Mental Status: He is alert.     Patient Lines/Drains/Airways Status     Active Line/Drains/Airways     Name Placement date Placement time Site Days   Peripheral IV 05/07/24 20 G 1 Right Antecubital 05/07/24  1424  Antecubital  1            Pertinent labs and imaging:  Am cortisol 15.8    Latest Ref Rng & Units 05/10/2024    2:57 AM 05/09/2024    2:25 AM 05/08/2024    4:43 AM  CBC  WBC 4.0 - 10.5 K/uL 6.3  4.7  6.6   Hemoglobin 13.0 - 17.0 g/dL 86.3  87.2  86.8   Hematocrit 39.0 - 52.0 % 37.4  34.8  35.6   Platelets 150 - 400 K/uL 161  145  172        Latest Ref Rng & Units 05/10/2024    9:38 AM 05/10/2024    2:57 AM 05/09/2024    7:22 PM  CMP  Glucose 70 - 99 mg/dL 93  899  893   BUN 8 - 23 mg/dL 11  13  13    Creatinine 0.61 - 1.24 mg/dL 8.95  8.92  8.95   Sodium 135 - 145 mmol/L 126  124  122   Potassium 3.5 - 5.1 mmol/L 3.2  3.3  3.6   Chloride 98 - 111 mmol/L 92  89  88   CO2 22 - 32 mmol/L 26  25  24    Calcium  8.9 - 10.3 mg/dL 8.0  7.9  7.8     No results found.   ASSESSMENT/PLAN:  Assessment: Principal Problem:   Hyponatremia Active Problems:   Benign essential HTN   Elevated LDL cholesterol level   Chronic systolic heart failure (HCC)   GERD (gastroesophageal reflux disease)   Plan: #Severe Hyponatremia Pt presented to the ED and was found to have serum sodium of 115. This morning, sodium at 124. On exam, he appears euvolemic. Currently suspect SIADH as primary cause of hyponatremia due to euvolemic appearance  on exam and urine sodium 86. Etiology of SIADH undetermined at this time as he has not had recent trauma, stroke, CNS infection/pathology. TSH and AM cortisol wnl. Regimen includes fluid restriction and sodium tablets. BMP q6h for continuous sodium monitoring. Monitor for sodium rise >6 in 24 hour period. Will give free water if so. If sodium continues to rise appropriately to safe levels, he may be considered for discharge tomorrow with close PCP follow up. - Stop IV fluids - Continue fluid restriction (1200 ml) - Sodium chloride  tablet 2g BID - BMP q6h, may switch to q8h if Na continues to improve - Goal sodium 130 in next 24 hours - Continuous cardiac telemetry  #Tachycardia During this admission, pt was found to have sustained tachycardia in the 120s and 130s that would jump to 140s and 150s unsustained. EKG consistent with sinus tachycardia with PACs. This resolved without intervention. Likely in the setting of acute illness. He has not had another episode of tachycardia since the first. Will continue to monitor for return of symptoms. - Continue cardiac telemetry  #UTI During his stay, he has not reported any dysuria or urinary frequency. Urinalysis from admission was positive for leukocyte esterase, nitrites, and had many bacteria under microscopy. Urine culture showing E. Coli, susceptible to all antibiotics tested. He has received 3 doses ceftriaxone . Will change to oral nitrofurantoin , which he can take once discharged for a 5 day course of antibiotics. - Transition to oral nitrofurantoin  for 2 more days  #Acute Uncomplicated Pancreatitis PT presented to ED with epigastric pain and one episode of profuse vomiting. He was started on fluids and bowel rest. He was able to eat a small amount of solid food this morning. Today he reports that his abdominal pain has completely resolved and he is able to eat full meals without issue. Pancreatitis has likely resolved, but will monitor for recurrent  symptoms while he remains. - IV Zofran  4mg  q6h PRN for nausea - PO acetaminophen  650mg  q6h for mild pain - PO Oxy 5 mg q6h PRN for moderate/severe pain  #Hx of CVA  Ischemic CVA in 2016 s/p tpa administration. Continuing his aspirin  and statin. He should likely be on  a higher dose of his Crestor  for secondary ASCVD prevention. Consider outpatient follow-up. - Continue home ASA 81 mg daily - Continue home Crestor  10 mg daily  #Nonischemic cardiomyopathy #Combined systolic and diastolic heart failure Most recent echo on 09/30/2023 showing improvement of LVEF to 45-50%, grade 1 diastolic dysfunction.  Consider restarting ACEi/ARB and SGLT2i in the outpatient setting after resolution of UTI. - Start metoprolol  succinate 25 mg daily   #Asymptomatic aortic aneurysm Follows with Dr. Shlomo with cardiology.  Chest CT in 12/21/2022 showed a 4 cm ascending aorta.  Will require frequent outpatient monitoring with his cardiologist.  #Sinus bradycardia Stable, no symptoms at this time.  During exam, the patient was with normal rate.  Will continue to monitor.  #Elevated Liver Enzymes Hepatic pattern, mildly elevated AST/ALT.  Likely in the setting of acute pancreatitis, no liver pathology seen on CT scan.  Interestingly no duct dilatation or gallstones. - Continue to monitor  #Hypertension Restarting his beta-blocker tomorrow.  Holding spironolactone  in the setting of hyponatremia.  Not on an ACE or an ARB, can consider initiating this. - Start metoprolol  succinate 25 mg daily tomorrow  #GERD Continuing his omeprazole  in the form of pantoprazole . - Pantoprazole  40 mg daily  #BPH - Continue tamsulosin  0.4 mg daily - Continue finasteride  5 mg daily  Best Practice: Diet: Soft with fluid restriction (1200cc) IVF: Fluids: None VTE: enoxaparin  (LOVENOX ) injection 40 mg Start: 05/07/24 1800 Code: Full  Disposition planning: Therapy Recs: Pending, DME: other pending Family Contact: Wife,  Daughter, will call for updates. DISPO: Anticipated discharge pending improvement in sodium levels.  Signature:  Luie Siegel [facility]  11:47 AM, 05/10/2024  On Call pager [phone]  Attestation for Student Documentation:  I personally was present and re-performed the history, physical exam and medical decision-making activities of this service and have verified that the service and findings are accurately documented in the student's note.  Waymond Cart, MD 05/10/2024, 11:47 AM

## 2024-05-10 NOTE — Plan of Care (Signed)

## 2024-05-11 LAB — BASIC METABOLIC PANEL WITH GFR
Anion gap: 9 (ref 5–15)
Anion gap: 8 (ref 5–15)
Anion gap: 8 (ref 5–15)
Anion gap: 12 (ref 5–15)
BUN: 12 mg/dL (ref 8–23)
BUN: 9 mg/dL (ref 8–23)
BUN: 14 mg/dL (ref 8–23)
BUN: 15 mg/dL (ref 8–23)
CO2: 25 mmol/L (ref 22–32)
CO2: 25 mmol/L (ref 22–32)
CO2: 23 mmol/L (ref 22–32)
CO2: 24 mmol/L (ref 22–32)
Calcium: 7.8 mg/dL — ABNORMAL LOW (ref 8.9–10.3)
Calcium: 8.1 mg/dL — ABNORMAL LOW (ref 8.9–10.3)
Calcium: 8 mg/dL — ABNORMAL LOW (ref 8.9–10.3)
Calcium: 8.1 mg/dL — ABNORMAL LOW (ref 8.9–10.3)
Chloride: 94 mmol/L — ABNORMAL LOW (ref 98–111)
Chloride: 95 mmol/L — ABNORMAL LOW (ref 98–111)
Chloride: 96 mmol/L — ABNORMAL LOW (ref 98–111)
Chloride: 92 mmol/L — ABNORMAL LOW (ref 98–111)
Creatinine, Ser: 1 mg/dL (ref 0.61–1.24)
Creatinine, Ser: 1.07 mg/dL (ref 0.61–1.24)
Creatinine, Ser: 1.1 mg/dL (ref 0.61–1.24)
Creatinine, Ser: 1.01 mg/dL (ref 0.61–1.24)
GFR, Estimated: >60 mL/min (ref >60)
GFR, Estimated: >60 mL/min (ref >60)
GFR, Estimated: >60 mL/min (ref >60)
GFR, Estimated: >60 mL/min (ref >60)
Glucose, Bld: 91 mg/dL (ref 70–99)
Glucose, Bld: 99 mg/dL (ref 70–99)
Glucose, Bld: 107 mg/dL — ABNORMAL HIGH (ref 70–99)
Glucose, Bld: 123 mg/dL — ABNORMAL HIGH (ref 70–99)
Potassium: 3.7 mmol/L (ref 3.5–5.1)
Potassium: 3.4 mmol/L — ABNORMAL LOW (ref 3.5–5.1)
Potassium: 4 mmol/L (ref 3.5–5.1)
Potassium: 3.9 mmol/L (ref 3.5–5.1)
Sodium: 128 mmol/L — ABNORMAL LOW (ref 135–145)
Sodium: 128 mmol/L — ABNORMAL LOW (ref 135–145)
Sodium: 127 mmol/L — ABNORMAL LOW (ref 135–145)
Sodium: 128 mmol/L — ABNORMAL LOW (ref 135–145)

## 2024-05-11 LAB — CBC
HCT: 35 % — ABNORMAL LOW (ref 39.0–52.0)
Hemoglobin: 12.5 g/dL — ABNORMAL LOW (ref 13.0–17.0)
MCH: 30.9 pg (ref 26.0–34.0)
MCHC: 35.7 g/dL (ref 30.0–36.0)
MCV: 86.6 fL (ref 80.0–100.0)
Platelets: 174 K/uL (ref 150–400)
RBC: 4.04 MIL/uL — ABNORMAL LOW (ref 4.22–5.81)
RDW: 12.2 % (ref 11.5–15.5)
WBC: 6.4 K/uL (ref 4.0–10.5)
nRBC: 0 % (ref 0.0–0.2)

## 2024-05-11 MED ORDER — SODIUM CHLORIDE 1 G PO TABS
2.0000 g | ORAL_TABLET | Freq: Three times a day (TID) | ORAL | Status: DC
  Administered 2024-05-11 – 2024-05-12 (×2): 2 g via ORAL
  Filled 2024-05-11 (×2): qty 2

## 2024-05-11 MED ORDER — POTASSIUM CHLORIDE 20 MEQ PO PACK
40.0000 meq | PACK | Freq: Once | ORAL | Status: AC
  Administered 2024-05-11: 40 meq via ORAL
  Filled 2024-05-11: qty 2

## 2024-05-11 NOTE — Progress Notes (Signed)
 Patient ID: Edward [name], male   DOB: 06-25-1951, 73 y.o.   MRN: [id number]   HD#4 SUBJECTIVE:  Patient Summary: Edward [name] is a 73 y.o. with a pertinent PMH of Nonischemic Cardiomyopathy, CVA in 2016, mild ascending aortic aneurysm, HTN, HLD, who presented with abdominal pain and admitted for severe hyponatremia, acute pancreatitis, and UTI.   Overnight Events: NAEON  Interim History:  Patient is feeling well this morning. No abdominal pain. Denies dysuria or urinary frequency. Wondering when he can go home.   OBJECTIVE:  Vital Signs: Vitals:   05/11/24 0359 05/11/24 0743 05/11/24 0820 05/11/24 1135  BP: 120/67 122/84 122/84 119/83  Pulse:   84 67  Resp: 19 18  18   Temp: 98.4 F (36.9 C) 98.5 F (36.9 C)  98.1 F (36.7 C)  TempSrc: Oral Oral  Oral  SpO2: 96% 96%  95%  Weight:      Height:       Supplemental O2: Room Air SpO2: 95 %  Filed Weights   05/07/24 1416 05/08/24 1224  Weight: 97.5 kg 98.3 kg     Intake/Output Summary (Last 24 hours) at 05/11/2024 1407 Last data filed at 05/10/2024 2100 Gross per 24 hour  Intake 240 ml  Output --  Net 240 ml   Net IO Since Admission: 2,119.99 mL [05/11/24 1407]  Physical Exam: Physical Exam Constitutional:      General: He is not in acute distress.    Appearance: He is well-developed. He is not ill-appearing.     Comments: Appears euvolemic  HENT:     Head: Normocephalic and atraumatic.  Eyes:     Extraocular Movements: Extraocular movements intact.     Pupils: Pupils are equal, round, and reactive to light.  Cardiovascular:     Rate and Rhythm: Normal rate and regular rhythm.     Heart sounds: Normal heart sounds. No murmur heard. Pulmonary:     Effort: Pulmonary effort is normal. No tachypnea or respiratory distress.     Breath sounds: Normal breath sounds.     Comments: Anterior lung fields CTAB Abdominal:     General: Bowel sounds are normal.     Tenderness: There is no abdominal tenderness. There is no guarding  or rebound.  Musculoskeletal:     Right lower leg: No edema.     Left lower leg: No edema.  Skin:    General: Skin is warm and dry.     Capillary Refill: Capillary refill takes less than 2 seconds.  Neurological:     Mental Status: He is alert.     Patient Lines/Drains/Airways Status     Active Line/Drains/Airways     Name Placement date Placement time Site Days   Peripheral IV 05/07/24 20 G 1 Right Antecubital 05/07/24  1424  Antecubital  1            Pertinent labs and imaging:  Am cortisol 15.8    Latest Ref Rng & Units 05/11/2024    4:04 AM 05/10/2024    2:57 AM 05/09/2024    2:25 AM  CBC  WBC 4.0 - 10.5 K/uL 6.4  6.3  4.7   Hemoglobin 13.0 - 17.0 g/dL 87.4  86.3  87.2   Hematocrit 39.0 - 52.0 % 35.0  37.4  34.8   Platelets 150 - 400 K/uL 174  161  145        Latest Ref Rng & Units 05/11/2024   12:33 PM 05/11/2024    7:18 AM 05/11/2024  4:04 AM  CMP  Glucose 70 - 99 mg/dL 892  99  91   BUN 8 - 23 mg/dL 14  9  12    Creatinine 0.61 - 1.24 mg/dL 8.89  8.92  8.99   Sodium 135 - 145 mmol/L 127  128  128   Potassium 3.5 - 5.1 mmol/L 4.0  3.4  3.7   Chloride 98 - 111 mmol/L 96  95  94   CO2 22 - 32 mmol/L 23  25  25    Calcium  8.9 - 10.3 mg/dL 8.0  8.1  7.8     No results found.   ASSESSMENT/PLAN:  Assessment: Principal Problem:   Hyponatremia Active Problems:   Benign essential HTN   Elevated LDL cholesterol level   Chronic systolic heart failure (HCC)   GERD (gastroesophageal reflux disease)   Plan: #Severe Hyponatremia Pt presented to the ED and was found to have serum sodium of 115. This morning, sodium at 128. On exam, he appears euvolemic. Currently suspect SIADH as primary cause of hyponatremia due to euvolemic appearance on exam and urine sodium 86. Etiology of SIADH undetermined at this time as he has not had recent trauma, stroke, CNS infection/pathology. May be medication induced as he takes meloxicam  and spironolactone . TSH and AM cortisol  wnl. Regimen includes fluid restriction and sodium tablets. BMP q6h for continuous sodium monitoring. Monitor for sodium rise >6 in 24 hour period. Will give free water if so. If sodium continues to rise appropriately to safe levels, he may be considered for discharge tomorrow with close PCP follow up.  - Stop IV fluids - Continue fluid restriction (1200 ml) - Sodium chloride  tablet 2g tid with meals - BMP q6h, may switch to q8h if Na continues to improve - Goal sodium 132 in next 24 hours - Continuous cardiac telemetry  #Tachycardia During this admission, pt was found to have sustained tachycardia in the 120s and 130s that would jump to 140s and 150s unsustained. EKG consistent with sinus tachycardia with PACs. This resolved without intervention. Likely in the setting of acute illness. He has not had another episode of tachycardia since the first. Will continue to monitor for return of symptoms. - Continue cardiac telemetry  #UTI During his stay, he has not reported any dysuria or urinary frequency. Urinalysis from admission was positive for leukocyte esterase, nitrites, and had many bacteria under microscopy. Urine culture showing E. Coli, susceptible to all antibiotics tested. He has received 3 doses ceftriaxone . Will change to oral nitrofurantoin . Will finish course tonight. - PO Nitrofurantoin  course will finish tonight.  #Acute Uncomplicated Pancreatitis PT presented to ED with epigastric pain and one episode of profuse vomiting. He was started on fluids and bowel rest. He was able to eat a small amount of solid food this morning. Today he reports that his abdominal pain has completely resolved and he is able to eat full meals without issue. Pancreatitis has likely resolved, but will monitor for recurrent symptoms while he remains. - IV Zofran  4mg  q6h PRN for nausea - PO acetaminophen  650mg  q6h for mild pain - PO Oxy 5 mg q6h PRN for moderate/severe pain  #Hx of CVA  Ischemic CVA in 2016  s/p tpa administration. Continuing his aspirin  and statin. He should likely be on a higher dose of his Crestor  for secondary ASCVD prevention. Consider outpatient follow-up. - Continue home ASA 81 mg daily - Continue home Crestor  10 mg daily  #Nonischemic cardiomyopathy #Combined systolic and diastolic heart failure Most recent echo  on 09/30/2023 showing improvement of LVEF to 45-50%, grade 1 diastolic dysfunction.  Consider restarting ACEi/ARB and SGLT2i in the outpatient setting after resolution of UTI. - Start metoprolol  succinate 25 mg daily   #Asymptomatic aortic aneurysm Follows with Dr. Shlomo with cardiology.  Chest CT in 12/21/2022 showed a 4 cm ascending aorta.  Will require frequent outpatient monitoring with his cardiologist.  #Sinus bradycardia Stable, no symptoms at this time.  During exam, the patient was with normal rate.  Will continue to monitor.  #Elevated Liver Enzymes Hepatic pattern, mildly elevated AST/ALT.  Likely in the setting of acute pancreatitis, no liver pathology seen on CT scan.  Interestingly no duct dilatation or gallstones. - Continue to monitor  #Hypertension Restarting his beta-blocker tomorrow.  Holding spironolactone  in the setting of hyponatremia.  Not on an ACE or an ARB, can consider initiating this. - Start metoprolol  succinate 25 mg daily tomorrow  #GERD Continuing his omeprazole  in the form of pantoprazole . - Pantoprazole  40 mg daily  #BPH - Continue tamsulosin  0.4 mg daily - Continue finasteride  5 mg daily  Best Practice: Diet: Soft with fluid restriction (1200cc) IVF: Fluids: None VTE: enoxaparin  (LOVENOX ) injection 40 mg Start: 05/07/24 1800 Code: Full  Disposition planning: Therapy Recs: Pending, DME: pending Family Contact: Wife at bedside. DISPO: Anticipated discharge pending improvement in sodium levels.  Signature:  Letha Cheadle, MD [facility]  PGY-1 05/11/24, 2:07 PM

## 2024-05-11 NOTE — Discharge Instructions (Addendum)
 To Edward [name] Brought or their caretakers,  You were recently admitted to [facility] for severe hyponatremia (low levels of sodium in the blood), pancreatitis, and a urinary tract infection.  We think your hyponatremia was caused by a condition called SIADH.  This may have been caused by some of your medications.  Please stop taking spironolactone  and meloxicam  until you see your PCP.  Your low sodium was treated with fluid restriction and salt tablets.  Please continue taking salt tablets until your next Internal Medicine appointment with Dr. Kandis on 05/17/2024 at 2:45 PM.   Continue taking your home medications with the following changes:  Start taking Sodium chloride  1 g - 2 tablets three times daily with meals Stop taking Mobic  (meloxicam ) 15 mg Spironolactone  (Aldactone ) 12.5 mg daily Continue taking Acetaminophen  (Tylenol ) 650 mg Albuterol  (Ventolin  HFA) 108 (90 base) MCG/ACT inhaler Aspirin  EC 81 mg daily Coenzyme Q 100 mg CAPS Finasteride  (Proscar ) 5 mg daily Ibuprofen  (Advil ) 400 mg every 6 hours as needed for pain Metoprolol  succinate (Toprol -XL) 25 mg daily Multivitamin Acetylcysteine Omeprazole  (Prilosec) 20 mg daily Cranberry capsule Rosuvastatin  (Crestor ) 10 mg daily Tamsulosin  (Flomax ) 0.4 mg CAPS   You should seek further medical care if you experience confusions, seizures, worsening abdominal pain, or chest pain.   We have scheduled you a hospital follow-up visit with our internal medicine clinic with Dr. Damien Kandis on 05/17/2024 2:45 PM . We are so glad that you are feeling better.  Sincerely,  [name]

## 2024-05-11 NOTE — Progress Notes (Signed)
 Mobility Specialist Progress Note:   05/11/24 1430  Mobility  Activity Ambulated with assistance  Level of Assistance Minimal assist, patient does 75% or more  Assistive Device None  Distance Ambulated (ft) 400 ft  Activity Response Tolerated fair  Mobility Referral Yes  Mobility visit 1 Mobility  Mobility Specialist Start Time (ACUTE ONLY) 1430  Mobility Specialist Stop Time (ACUTE ONLY) 1445  Mobility Specialist Time Calculation (min) (ACUTE ONLY) 15 min   Pt eager for mobility session. Required CGA majority of gait training, up to minA with x2 LOB. Pt with forward flexed posture, causing pt speed to incr. Slows down with cues. Pt with mod SOB post-ambulation. SPO2 96% on RA, pt very deconditioned. Back sitting EOB with all needs met, eager for d/c or to go outside.   Therisa Rana Mobility Specialist Please contact via SecureChat or  Rehab office at [phone]

## 2024-05-11 NOTE — Progress Notes (Signed)
 Patient stated that he felt like he was having intermittent SOB, SPO2 95 and above, albuterol  treatment given. Will continue to monitor

## 2024-05-11 NOTE — Progress Notes (Signed)
 Patient asked if its ok for him to be wheeled outside for a little bit, MD stated that would be fine. Patient wheeled outside by NT.

## 2024-05-11 NOTE — Discharge Summary (Signed)
 Name: Edward [name] MRN: [id number] DOB: Dec 02, 1950 73 y.o. PCP: Edgardo Pontiff, DO  Date of Admission: 05/07/2024  1:38 PM Date of Discharge: 05/12/2024  Attending Physician: Dr. Reyes Fenton  Discharge Diagnosis: Principal Problem:   Hyponatremia Active Problems:   Benign essential HTN   Elevated LDL cholesterol level   Chronic systolic heart failure (HCC)   GERD (gastroesophageal reflux disease) Sinus Tachycardia Anemia Hypokalemia   Discharge Medications: Allergies as of 05/12/2024   No Known Allergies      Medication List     PAUSE taking these medications    meloxicam  15 MG tablet Wait to take this until your doctor or other care provider tells you to start again. Commonly known as: MOBIC  Take 1 tablet (15 mg total) by mouth daily as needed for pain.   spironolactone  25 MG tablet Wait to take this until your doctor or other care provider tells you to start again. Commonly known as: ALDACTONE  Take 0.5 tablets (12.5 mg total) by mouth daily. What changed: when to take this       TAKE these medications    acetaminophen  650 MG CR tablet Commonly known as: TYLENOL  Take 1,300 mg by mouth every 8 (eight) hours as needed for pain.   albuterol  108 (90 Base) MCG/ACT inhaler Commonly known as: VENTOLIN  HFA Inhale 2 puffs into the lungs every 4 (four) hours as needed for wheezing or shortness of breath.   aspirin  EC 81 MG tablet Take 81 mg by mouth daily. Swallow whole.   CoQ-10 100 MG Caps Take 1 capsule by mouth in the morning and at bedtime.   finasteride  5 MG tablet Commonly known as: PROSCAR  TAKE 1 TABLET (5 MG TOTAL) BY MOUTH DAILY.   ibuprofen  200 MG tablet Commonly known as: ADVIL  Take 400 mg by mouth every 6 (six) hours as needed for mild pain (pain score 1-3).   metoprolol  succinate 25 MG 24 hr tablet Commonly known as: TOPROL -XL TAKE 1 TABLET (25 MG) BY MOUTH DAILY. APPOINTMENT NEEDED FOR CONTINUATION OF REFILLS-1ST ATTEMPT   multivitamin  with minerals Tabs tablet Take 1 tablet by mouth daily.   N-Acetyl Cysteine 600 MG Tabs Generic drug: Acetylcysteine (Nutrient) Take 1 tablet by mouth every morning.   omeprazole  20 MG capsule Commonly known as: PRILOSEC TAKE 1 CAPSULE BY MOUTH EVERY DAY   OVER THE COUNTER MEDICATION Take 1 capsule by mouth every morning. PQQ: Pyrroloquinoline Quinone Take one capsule by mouth daily   RA Cranberry 500 MG Caps Generic drug: Cranberry Take 1 capsule by mouth in the morning and at bedtime.   rosuvastatin  10 MG tablet Commonly known as: CRESTOR  TAKE 1 TABLET BY MOUTH EVERY DAY What changed:  when to take this additional instructions   sodium chloride  1 g tablet Take 2 tablets (2 g total) by mouth 3 (three) times daily with meals.   tamsulosin  0.4 MG Caps capsule Commonly known as: FLOMAX  TAKE 1 CAPSULE BY MOUTH EVERY DAY What changed: when to take this        Disposition and follow-up:   Edward [name] was discharged from [facility] in Good condition.  At the hospital follow up visit please address:  1.  Follow-up:   a. Severe hyponatremia - please reassess patient for signs of hyponatremia as well as rechecking sodium level. He was asymptomatic during this hospitalization. Reassess whether salt tablets should be continued.  Likely due to SIADH, etiology unknown but may be medication-induced.  Assess whether spironolactone  or meloxicam  should  be continued.  These may have been causing his SIADH.  Patient clinically improved.    b. Pancreatitis - pain has resolved.  Initial lipase was within normal limits.  Only evidence of pancreatitis was CT imaging findings.  Please reassess for abdominal pain.   c. UTI - He completed his course of Macrobid  before leaving the hospital. Please ensure he is not having any dysuria/urinary frequency.  He does have a history of BPH which may be confounding symptoms.   d. Hx CVA 2016 - patient is on ASA 81 mg and Crestor  10  mg.  He should likely be on a higher dose of Crestor .  Please assess whether this change should be made.  2.  Labs / imaging needed at time of follow-up: BMP for sodium  3.  Pending labs/ test needing follow-up: None   Follow-up Appointments:  Follow-up Information     Kandis Perkins, DO Follow up on 05/17/2024.   Specialty: Internal Medicine Why: You have an appointment on 05/17/2024 at 2:45 PM Contact information: [address] [place] [address] [phone]                 Hospital Course by problem list: #Severe Hyponatremia When presenting to the ED, he was found to be hyponatremic with Na at 115. He was initially treated with IV NS, which was also appropriate for concurrent acute pancreatitis. Initially appeared volume down but after a day of fluids was appearing euvolemic. Suspect hyponatremia due to SIADH given low serum osmolality with high urine osmolality. Etiology of possible SIADH is unclear as he has not had recent trauma, stroke, CNS infection/pathology. May be medication induced as he takes spironolactone  and meloxicam . Fluid restriction was subsequently initiated. Sodium tablets added to regimen to help increase levels. Once these treatments were in place, his sodium steadily increased over the course of his stay to 130. He was discharged with instructions to continue salt tablets and hold spironolactone  + meloxicam  until his next clinic visit.  #Acute uncomplicated pancreatitis Pt presented to ED with epigastric pain and one episode of profuse vomiting. CT imaging findings were consistent with pancreatitis but lipase wnl. He was started on IV fluids and bowel rest. Due to concurrent SIADH hyponatremia, IV fluids were stopped. Pain was managed with Dilaudid  and Oxy PRN. Over the course of his stay, his pain continued to improve and he gradually progressed his diet until returning to normal diet.   #Sinus Tachycardia Patient's hospital course  complicated by sinus tachycardia with PACs on telemetry and EKG. Rates briefly up to 150s but sustained mainly in the 120s-130s. Thought to be due to current illness state with pancreatitis. We did not pursue further interventions and this resolved on its own.  #UTI While being evaluated for pancreatitis and hyponatremia, he was found to have E. coli UTI on urine culture. He was initially treated with ceftriaxone  while awaiting susceptibilities then switched to macrobid . He was discharged after completing his course of antibiotics.  #Hx of CVA  Ischemic CVA in 2016 s/p tpa administration.  We continued his home doses of aspirin  and statin. He should likely be on a higher dose of his Crestor  for secondary ASCVD prevention. Consider outpatient follow-up.  #Hypokalemia Mildly hypokalemic down to 2.2 and later 3.4.  Was repleted with potassium chloride .  #Nonischemic cardiomyopathy #Combined systolic and diastolic heart failure Most recent echo on 09/30/2023 showing improvement of LVEF to 45-50%, grade 1 diastolic dysfunction.  We continued his home metoprolol  succinate.  Consider restarting ACEi/ARB and SGLT2i in the outpatient setting after resolution of UTI.   #Asymptomatic aortic aneurysm Follows with Dr. Shlomo with cardiology.  Chest CT in 12/21/2022 showed a 4 cm ascending aorta.  Will require frequent outpatient monitoring with his cardiologist.  No acute concerns during this admission.   #Sinus bradycardia Stable, no symptoms at this time.  During initial exam, the patient was with normal rate.  He did have episodes of sinus tachycardia, but no concerns for bradycardia during this admission.   #Elevated Liver Enzymes Hepatic pattern, mildly elevated AST/ALT.  Likely in the setting of acute pancreatitis, no liver pathology seen on CT scan.  Interestingly no duct dilatation or gallstones.  #Hypertension Blood pressures were slightly elevated during this admission.  Held his spironolactone   in the setting of hyponatremia.  Not on an ACE or an ARB, can consider initiating this in the outpatient setting.   #GERD Switched his pantoprazole  to omeprazole  while hospitalized.  Restarted pantoprazole  upon discharge.   #BPH Continued his home medications.  No acute concerns.   Discharge Subjective: Edward [name] reports feeling great and ready to leave. He was able to sit outside yesterday. Not having any symptoms of headache, dizziness, or confusion.  Patient is medically ready for discharge.  Discharge Exam:   BP 130/88 (BP Location: Left Arm)   Pulse 82   Temp 98.5 F (36.9 C) (Oral)   Resp 14   Ht 6' 1 (1.854 m)   Wt 98.3 kg   SpO2 96%   BMI 28.59 kg/m  Physical Exam: Constitutional: well-appearing, well-nourished, in no acute distress HENT: normocephalic atraumatic, mucous membranes moist Eyes: conjunctiva non-erythematous, PERRL, no scleral icterus Neck: supple without lesions, thyroid  non-enlarged and non-tender Cardiovascular: regular rate and rhythm, no m/r/g Pulmonary/Chest: normal work of breathing on room air, lungs CTAB Abdominal: soft, non-tender, non-distended, bowel sounds normal MSK: normal bulk and tone Neurological: alert & oriented x3 Skin: warm and dry Extremities: no edema or cyanosis; peripheral pulses intact Psych: normal mood and affect, thought content normal   Pertinent Labs, Studies, and Procedures:     Latest Ref Rng & Units 05/11/2024    4:04 AM 05/10/2024    2:57 AM 05/09/2024    2:25 AM  CBC  WBC 4.0 - 10.5 K/uL 6.4  6.3  4.7   Hemoglobin 13.0 - 17.0 g/dL 87.4  86.3  87.2   Hematocrit 39.0 - 52.0 % 35.0  37.4  34.8   Platelets 150 - 400 K/uL 174  161  145        Latest Ref Rng & Units 05/12/2024    1:53 AM 05/11/2024    7:25 PM 05/11/2024   12:33 PM  CMP  Glucose 70 - 99 mg/dL 92  876  892   BUN 8 - 23 mg/dL 12  15  14    Creatinine 0.61 - 1.24 mg/dL 9.01  8.98  8.89   Sodium 135 - 145 mmol/L 130  128  127   Potassium 3.5 -  5.1 mmol/L 3.7  3.9  4.0   Chloride 98 - 111 mmol/L 96  92  96   CO2 22 - 32 mmol/L 24  24  23    Calcium  8.9 - 10.3 mg/dL 8.0  8.1  8.0     CT ABDOMEN PELVIS W CONTRAST Result Date: 05/07/2024 CLINICAL DATA:  Epigastric pain EXAM: CT ABDOMEN AND PELVIS WITH CONTRAST TECHNIQUE: Multidetector CT imaging of the abdomen and pelvis was performed using the standard protocol following  bolus administration of intravenous contrast. RADIATION DOSE REDUCTION: This exam was performed according to the departmental dose-optimization program which includes automated exposure control, adjustment of the mA and/or kV according to patient size and/or use of iterative reconstruction technique. CONTRAST:  75mL OMNIPAQUE  IOHEXOL  350 MG/ML SOLN COMPARISON:  12/21/2022, 08/08/2019 FINDINGS: Lower chest: Elevated right hemidiaphragm, with compressive atelectasis at the right lung base. No acute pleural or parenchymal lung disease. Hepatobiliary: No focal liver abnormality is seen. No gallstones, gallbladder wall thickening, or biliary dilatation. Pancreas: There is mild peripancreatic fat stranding along the dorsal margin of the pancreatic body, reference image 27/3, consistent with mild acute uncomplicated pancreatitis. No pancreatic duct dilation. No fluid collection or pseudocyst. Spleen: Normal in size without focal abnormality. Adrenals/Urinary Tract: Bilateral subcentimeter renal cortical cysts do not require specific imaging follow-up. Otherwise the kidneys enhance normally. Extrarenal pelvis is again noted within the left kidney. No urinary tract calculi or obstructive uropathy. There is mild diffuse bladder wall thickening and trabeculations, consistent with chronic bladder outlet obstruction. No filling defects. The adrenals are unremarkable. Stomach/Bowel: No bowel obstruction or ileus. The appendix, if still present, is not well visualized. Diffuse colonic diverticulosis without diverticulitis. Small hiatal hernia. No  bowel wall thickening or inflammatory change. Vascular/Lymphatic: No significant vascular findings are present. No enlarged abdominal or pelvic lymph nodes. Reproductive: Prostate is unremarkable. Other: Trace pelvic free fluid. No free intraperitoneal gas. Small fat containing umbilical hernia and moderate fat containing left inguinal hernia. No bowel herniation. Musculoskeletal: No acute or destructive bony abnormalities. Reconstructed images demonstrate no additional findings. IMPRESSION: 1. Mild peripancreatic fat stranding consistent with acute uncomplicated pancreatitis. No fluid collection, pseudocyst, or abscess. 2. Trace pelvic free fluid, likely reactive. 3. Diffuse colonic diverticulosis without diverticulitis. 4. Mild diffuse bladder wall thickening and trabeculations, most consistent with sequela of chronic bladder outlet obstruction. Electronically Signed   By: Ozell Daring M.D.   On: 05/07/2024 16:11   DG Chest 2 View Result Date: 05/07/2024 EXAM: 2 VIEW(S) XRAY OF THE CHEST 05/07/2024 02:09:00 PM COMPARISON: 05/05/2024 CLINICAL HISTORY: Chest pain. Per triage: Pt c.o epigastric pain since Friday with some n/v. Denies diarrhea or SOB. FINDINGS: LUNGS AND PLEURA: Bibasilar atelectasis. No pulmonary edema. No pleural effusion. No pneumothorax. HEART AND MEDIASTINUM: Mild cardiomegaly. No acute abnormality of the cardiac and mediastinal silhouettes. No congestive heart failure. BONES AND SOFT TISSUES: Multilevel thoracic osteophytosis. No acute osseous abnormality. VASCULATURE: Tortuous aorta. LIMITATIONS/ARTIFACTS: Lateral view degraded by patient arm position, not raised above the head. IMPRESSION: 1. No acute findings. 2. Mild cardiomegaly without congestive heart failure. Electronically signed by: Rockey Kilts MD 05/07/2024 02:40 PM EDT RP Workstation: [id number]     Discharge Instructions:   Discharge Instructions      To Alm Brought or their caretakers,  You were recently admitted to  [facility] for severe hyponatremia (low levels of sodium in the blood), pancreatitis, and a urinary tract infection.  We think your hyponatremia was caused by a condition called SIADH.  This may have been caused by some of your medications.  Please stop taking spironolactone  and meloxicam  until you see your PCP.  Your low sodium was treated with fluid restriction and salt tablets.  Please continue taking salt tablets until your next Internal Medicine appointment with Dr. Kandis on 05/17/2024 at 2:45 PM.   Continue taking your home medications with the following changes:  Start taking Sodium chloride  1 g - 2 tablets three times daily with meals Stop taking Mobic  (meloxicam ) 15 mg  Spironolactone  (Aldactone ) 12.5 mg daily Continue taking Acetaminophen  (Tylenol ) 650 mg Albuterol  (Ventolin  HFA) 108 (90 base) MCG/ACT inhaler Aspirin  EC 81 mg daily Coenzyme Q 100 mg CAPS Finasteride  (Proscar ) 5 mg daily Ibuprofen  (Advil ) 400 mg every 6 hours as needed for pain Metoprolol  succinate (Toprol -XL) 25 mg daily Multivitamin Acetylcysteine Omeprazole  (Prilosec) 20 mg daily Cranberry capsule Rosuvastatin  (Crestor ) 10 mg daily Tamsulosin  (Flomax ) 0.4 mg CAPS   You should seek further medical care if you experience confusions, seizures, worsening abdominal pain, or chest pain.   We have scheduled you a hospital follow-up visit with our internal medicine clinic with Dr. Damien Lease on 05/17/2024 2:45 PM . We are so glad that you are feeling better.  Sincerely,  [name]     Signed:  Letha Cheadle, MD Internal Medicine Resident, PGY-1 05/12/2024, 9:55 AM Please contact the on call pager after 5 pm and on weekends at [phone].

## 2024-05-11 NOTE — Plan of Care (Signed)

## 2024-05-12 ENCOUNTER — Other Ambulatory Visit (HOSPITAL_COMMUNITY): Payer: Self-pay

## 2024-05-12 DIAGNOSIS — I5022 Chronic systolic (congestive) heart failure: Secondary | ICD-10-CM

## 2024-05-12 DIAGNOSIS — N3000 Acute cystitis without hematuria: Secondary | ICD-10-CM

## 2024-05-12 DIAGNOSIS — I10 Essential (primary) hypertension: Secondary | ICD-10-CM

## 2024-05-12 LAB — BASIC METABOLIC PANEL WITH GFR
Anion gap: 10 (ref 5–15)
BUN: 12 mg/dL (ref 8–23)
CO2: 24 mmol/L (ref 22–32)
Calcium: 8 mg/dL — ABNORMAL LOW (ref 8.9–10.3)
Chloride: 96 mmol/L — ABNORMAL LOW (ref 98–111)
Creatinine, Ser: 0.98 mg/dL (ref 0.61–1.24)
GFR, Estimated: >60 mL/min (ref >60)
Glucose, Bld: 92 mg/dL (ref 70–99)
Potassium: 3.7 mmol/L (ref 3.5–5.1)
Sodium: 130 mmol/L — ABNORMAL LOW (ref 135–145)

## 2024-05-12 MED ORDER — SODIUM CHLORIDE 1 G PO TABS
2.0000 g | ORAL_TABLET | Freq: Three times a day (TID) | ORAL | 0 refills | Status: DC
  Filled 2024-05-12: qty 90, 15d supply, fill #0

## 2024-05-14 ENCOUNTER — Telehealth: Payer: Self-pay

## 2024-05-14 ENCOUNTER — Other Ambulatory Visit: Payer: Self-pay

## 2024-05-14 NOTE — Transitions of Care (Post Inpatient/ED Visit) (Signed)
 05/14/2024  Name: Edward [name] MRN: [id number] DOB: 1950/11/15  Today's TOC FU Call Status: Today's TOC FU Call Status:: Successful TOC FU Call Completed TOC FU Call Complete Date: 05/14/24 Patient's Name and Date of Birth confirmed.  Transition Care Management Follow-up Telephone Call Date of Discharge: 05/12/24 Discharge Facility: [name]) Type of Discharge: Inpatient Admission Primary Inpatient Discharge Diagnosis:: Hyponatremia, Pancreatitis How have you been since you were released from the hospital?: Better Any questions or concerns?: No  Items Reviewed: Did you receive and understand the discharge instructions provided?: Yes Medications obtained,verified, and reconciled?: Yes (Medications Reviewed) Any new allergies since your discharge?: No Dietary orders reviewed?: Yes Type of Diet Ordered:: Low sodium Heart Healthy Do you have support at home?: Yes People in Home [RPT]: spouse Name of Support/Comfort Primary Source: Louis Ivery  Medications Reviewed Today: Medications Reviewed Today     Reviewed by Moises Reusing, RN (Case Manager) on 05/14/24 at 1013  Med List Status: <None>   Medication Order Taking? Sig Documenting Provider Last Dose Status Informant  acetaminophen  (TYLENOL ) 650 MG CR tablet [id number] No Take 1,300 mg by mouth every 8 (eight) hours as needed for pain. [provider] 05/07/2024 Morning Active Spouse/Significant Other, Self, Pharmacy Records  albuterol  (VENTOLIN  HFA) 108 (90 Base) MCG/ACT inhaler [id number] No Inhale 2 puffs into the lungs every 4 (four) hours as needed for wheezing or shortness of breath.  Patient not taking: Reported on 05/08/2024   Raford Alm, MD Not Taking Active Spouse/Significant Other, Self, Pharmacy Records  aspirin  EC 81 MG tablet [id number] No Take 81 mg by mouth daily. Swallow whole. [provider] 05/07/2024 Morning Active Spouse/Significant Other, Self, Pharmacy Records  Coenzyme Q10 (COQ-10)  100 MG CAPS [id number] No Take 1 capsule by mouth in the morning and at bedtime. [provider] 05/07/2024 Morning Active Spouse/Significant Other, Self, Pharmacy Records  finasteride  (PROSCAR ) 5 MG tablet [id number] No TAKE 1 TABLET (5 MG TOTAL) BY MOUTH DAILY. Arellano Zameza, Priscila, MD 05/07/2024 Morning Active Spouse/Significant Other, Self, Pharmacy Records  ibuprofen  (ADVIL ) 200 MG tablet [id number] No Take 400 mg by mouth every 6 (six) hours as needed for mild pain (pain score 1-3). [provider] 05/07/2024 Morning Active Spouse/Significant Other, Self, Pharmacy Records  meloxicam  (MOBIC ) 15 MG tablet [id number] No Take 1 tablet (15 mg total) by mouth daily as needed for pain. Edgardo Pontiff, DO 05/07/2024 Morning Active Spouse/Significant Other, Self, Pharmacy Records  metoprolol  succinate (TOPROL -XL) 25 MG 24 hr tablet [id number] No TAKE 1 TABLET (25 MG) BY MOUTH DAILY. APPOINTMENT NEEDED FOR CONTINUATION OF REFILLS-1ST ATTEMPT Shlomo Wilbert SAUNDERS, MD 05/07/2024 Morning Active Spouse/Significant Other, Self, Pharmacy Records  Multiple Vitamin (MULTIVITAMIN WITH MINERALS) TABS tablet [id number] No Take 1 tablet by mouth daily. [provider] 05/07/2024 Morning Active Spouse/Significant Other, Self, Pharmacy Records  N-ACETYL CYSTEINE 600 MG TABS [id number] No Take 1 tablet by mouth every morning. [provider] 05/07/2024 Morning Active Spouse/Significant Other, Self, Pharmacy Records  omeprazole  (PRILOSEC) 20 MG capsule [id number] No TAKE 1 CAPSULE BY MOUTH EVERY DAY Arellano Zameza, Priscila, MD 05/07/2024 Morning Active Spouse/Significant Other, Self, Pharmacy Records  OVER THE COUNTER MEDICATION [id number] No Take 1 capsule by mouth every morning. PQQ: Pyrroloquinoline Quinone Take one capsule by mouth daily [provider] 05/07/2024 Morning Active Spouse/Significant Other, Self, Pharmacy Records  RA CRANBERRY 500 MG CAPS [id number] No Take 1 capsule by mouth in the morning  and at bedtime. [provider] 05/07/2024 Morning Active Spouse/Significant Other, Self, Pharmacy  Records  rosuvastatin  (CRESTOR ) 10 MG tablet [id number] No TAKE 1 TABLET BY MOUTH EVERY DAY  Patient taking differently: Take 10 mg by mouth 4 (four) times a week. Take one tablet (10mg ) by mouth at bedtime.   Arellano Zameza, Priscila, MD Taking Active Spouse/Significant Other, Self, Pharmacy Records  sodium chloride  1 g tablet [id number]  Take 2 tablets (2 g total) by mouth 3 (three) times daily with meals. Waymond Cart, MD  Active   spironolactone  (ALDACTONE ) 25 MG tablet [id number] No Take 0.5 tablets (12.5 mg total) by mouth daily.  Patient taking differently: Take 12.5 mg by mouth at bedtime.   Shlomo Wilbert SAUNDERS, MD 05/06/2024 Active Spouse/Significant Other, Self, Pharmacy Records  tamsulosin  (FLOMAX ) 0.4 MG CAPS capsule [id number] No TAKE 1 CAPSULE BY MOUTH EVERY DAY  Patient taking differently: Take 0.4 mg by mouth at bedtime.   Arellano Zameza, Priscila, MD 05/06/2024 Active Spouse/Significant Other, Self, Pharmacy Records            Home Care and Equipment/Supplies: Were Home Health Services Ordered?: NA Any new equipment or medical supplies ordered?: NA  Functional Questionnaire: Do you need assistance with bathing/showering or dressing?: No Do you need assistance with meal preparation?: No Do you need assistance with eating?: No Do you have difficulty maintaining continence: No Do you need assistance with getting out of bed/getting out of a chair/moving?: No Do you have difficulty managing or taking your medications?: No  Follow up appointments reviewed: PCP Follow-up appointment confirmed?: Yes Date of PCP follow-up appointment?: 05/17/24 Follow-up Provider: Damien Lease Specialist [facility] Follow-up appointment confirmed?: NA Do you need transportation to your follow-up appointment?: No Do you understand care options if your condition(s) worsen?: Yes-patient verbalized  understanding  SDOH Interventions Today    Flowsheet Row Most Recent Value  SDOH Interventions   Food Insecurity Interventions Intervention Not Indicated  Housing Interventions Intervention Not Indicated  Transportation Interventions Intervention Not Indicated  Utilities Interventions Intervention Not Indicated    Medford Balboa, BSN, RN [place]  VBCI - [facility] RN Care Manager [phone]

## 2024-05-14 NOTE — Telephone Encounter (Signed)
 Copied from CRM [id number]. Topic: Clinical - Medication Refill >> May 14, 2024  3:48 PM Laurier C wrote: Medication: albuterol  (VENTOLIN  HFA) 108 (90 Base) MCG/ACT inhaler  Has the patient contacted their pharmacy? Yes   This is the patient's preferred pharmacy:  [id number] - [place], [place] - [address] AT [address] [facility] [address] [place] [address] Phone: [phone] Fax: [phone]  Is this the correct pharmacy for this prescription? Yes If no, delete pharmacy and type the correct one.   Has the prescription been filled recently? Yes  Is the patient out of the medication? Yes  Has the patient been seen for an appointment in the last year OR does the patient have an upcoming appointment? Yes  Can we respond through MyChart? Yes  Agent: Please be advised that Rx refills may take up to 3 business days. We ask that you follow-up with your pharmacy.

## 2024-05-16 ENCOUNTER — Other Ambulatory Visit: Payer: Self-pay

## 2024-05-16 ENCOUNTER — Emergency Department (HOSPITAL_COMMUNITY)

## 2024-05-16 ENCOUNTER — Inpatient Hospital Stay (HOSPITAL_COMMUNITY)
Admission: EM | Admit: 2024-05-16 | Discharge: 2024-05-18 | DRG: 378 | Disposition: A | Discharge status: Home / Self Care | Attending: Internal Medicine | Admitting: Internal Medicine

## 2024-05-16 DIAGNOSIS — R109 Unspecified abdominal pain: Principal | ICD-10-CM

## 2024-05-16 DIAGNOSIS — N320 Bladder-neck obstruction: Secondary | ICD-10-CM | POA: Diagnosis not present

## 2024-05-16 DIAGNOSIS — I7121 Aneurysm of the ascending aorta, without rupture: Secondary | ICD-10-CM | POA: Diagnosis not present

## 2024-05-16 DIAGNOSIS — I728 Aneurysm of other specified arteries: Secondary | ICD-10-CM | POA: Diagnosis not present

## 2024-05-16 DIAGNOSIS — Z8249 Family history of ischemic heart disease and other diseases of the circulatory system: Secondary | ICD-10-CM | POA: Diagnosis not present

## 2024-05-16 DIAGNOSIS — Z79899 Other long term (current) drug therapy: Secondary | ICD-10-CM

## 2024-05-16 DIAGNOSIS — K219 Gastro-esophageal reflux disease without esophagitis: Secondary | ICD-10-CM | POA: Diagnosis present

## 2024-05-16 DIAGNOSIS — Z808 Family history of malignant neoplasm of other organs or systems: Secondary | ICD-10-CM

## 2024-05-16 DIAGNOSIS — K922 Gastrointestinal hemorrhage, unspecified: Secondary | ICD-10-CM

## 2024-05-16 DIAGNOSIS — Z8673 Personal history of transient ischemic attack (TIA), and cerebral infarction without residual deficits: Secondary | ICD-10-CM | POA: Diagnosis not present

## 2024-05-16 DIAGNOSIS — K2971 Gastritis, unspecified, with bleeding: Secondary | ICD-10-CM | POA: Diagnosis not present

## 2024-05-16 DIAGNOSIS — Z87891 Personal history of nicotine dependence: Secondary | ICD-10-CM | POA: Diagnosis not present

## 2024-05-16 DIAGNOSIS — H9190 Unspecified hearing loss, unspecified ear: Secondary | ICD-10-CM | POA: Diagnosis present

## 2024-05-16 DIAGNOSIS — K449 Diaphragmatic hernia without obstruction or gangrene: Secondary | ICD-10-CM | POA: Diagnosis present

## 2024-05-16 DIAGNOSIS — I77810 Thoracic aortic ectasia: Secondary | ICD-10-CM | POA: Diagnosis present

## 2024-05-16 DIAGNOSIS — K5731 Diverticulosis of large intestine without perforation or abscess with bleeding: Principal | ICD-10-CM | POA: Diagnosis present

## 2024-05-16 DIAGNOSIS — D62 Acute posthemorrhagic anemia: Secondary | ICD-10-CM | POA: Diagnosis present

## 2024-05-16 DIAGNOSIS — N400 Enlarged prostate without lower urinary tract symptoms: Secondary | ICD-10-CM | POA: Diagnosis present

## 2024-05-16 DIAGNOSIS — I639 Cerebral infarction, unspecified: Secondary | ICD-10-CM | POA: Diagnosis not present

## 2024-05-16 DIAGNOSIS — K5730 Diverticulosis of large intestine without perforation or abscess without bleeding: Secondary | ICD-10-CM | POA: Diagnosis not present

## 2024-05-16 DIAGNOSIS — Z7982 Long term (current) use of aspirin: Secondary | ICD-10-CM | POA: Diagnosis not present

## 2024-05-16 DIAGNOSIS — I110 Hypertensive heart disease with heart failure: Secondary | ICD-10-CM | POA: Diagnosis not present

## 2024-05-16 DIAGNOSIS — K4090 Unilateral inguinal hernia, without obstruction or gangrene, not specified as recurrent: Secondary | ICD-10-CM | POA: Diagnosis not present

## 2024-05-16 DIAGNOSIS — I10 Essential (primary) hypertension: Secondary | ICD-10-CM | POA: Diagnosis not present

## 2024-05-16 DIAGNOSIS — I5042 Chronic combined systolic (congestive) and diastolic (congestive) heart failure: Secondary | ICD-10-CM | POA: Diagnosis present

## 2024-05-16 DIAGNOSIS — R1013 Epigastric pain: Secondary | ICD-10-CM | POA: Diagnosis not present

## 2024-05-16 DIAGNOSIS — I428 Other cardiomyopathies: Secondary | ICD-10-CM | POA: Diagnosis present

## 2024-05-16 DIAGNOSIS — E782 Mixed hyperlipidemia: Secondary | ICD-10-CM | POA: Diagnosis not present

## 2024-05-16 DIAGNOSIS — Z8719 Personal history of other diseases of the digestive system: Secondary | ICD-10-CM | POA: Diagnosis not present

## 2024-05-16 DIAGNOSIS — Z823 Family history of stroke: Secondary | ICD-10-CM

## 2024-05-16 DIAGNOSIS — J9811 Atelectasis: Secondary | ICD-10-CM | POA: Diagnosis not present

## 2024-05-16 DIAGNOSIS — K429 Umbilical hernia without obstruction or gangrene: Secondary | ICD-10-CM | POA: Diagnosis present

## 2024-05-16 DIAGNOSIS — E871 Hypo-osmolality and hyponatremia: Secondary | ICD-10-CM | POA: Diagnosis not present

## 2024-05-16 DIAGNOSIS — R918 Other nonspecific abnormal finding of lung field: Secondary | ICD-10-CM | POA: Diagnosis not present

## 2024-05-16 DIAGNOSIS — D649 Anemia, unspecified: Secondary | ICD-10-CM | POA: Insufficient documentation

## 2024-05-16 DIAGNOSIS — K921 Melena: Secondary | ICD-10-CM | POA: Diagnosis present

## 2024-05-16 DIAGNOSIS — K317 Polyp of stomach and duodenum: Secondary | ICD-10-CM | POA: Diagnosis present

## 2024-05-16 DIAGNOSIS — K2950 Unspecified chronic gastritis without bleeding: Secondary | ICD-10-CM | POA: Diagnosis not present

## 2024-05-16 DIAGNOSIS — Z791 Long term (current) use of non-steroidal anti-inflammatories (NSAID): Secondary | ICD-10-CM | POA: Diagnosis not present

## 2024-05-16 DIAGNOSIS — K2951 Unspecified chronic gastritis with bleeding: Secondary | ICD-10-CM | POA: Diagnosis not present

## 2024-05-16 DIAGNOSIS — K2970 Gastritis, unspecified, without bleeding: Secondary | ICD-10-CM | POA: Diagnosis not present

## 2024-05-16 LAB — URINALYSIS, W/ REFLEX TO CULTURE (INFECTION SUSPECTED)
Bilirubin Urine: NEGATIVE
Glucose, UA: NEGATIVE mg/dL
Hgb urine dipstick: NEGATIVE
Ketones, ur: 5 mg/dL — AB
Leukocytes,Ua: NEGATIVE
Nitrite: NEGATIVE
Protein, ur: NEGATIVE mg/dL
Specific Gravity, Urine: 1.036 — ABNORMAL HIGH (ref 1.005–1.030)
pH: 5 (ref 5.0–8.0)

## 2024-05-16 LAB — I-STAT CHEM 8, ED
BUN: 18 mg/dL (ref 8–23)
Calcium, Ion: 1.1 mmol/L — ABNORMAL LOW (ref 1.15–1.40)
Chloride: 95 mmol/L — ABNORMAL LOW (ref 98–111)
Creatinine, Ser: 0.9 mg/dL (ref 0.61–1.24)
Glucose, Bld: 132 mg/dL — ABNORMAL HIGH (ref 70–99)
HCT: 33 % — ABNORMAL LOW (ref 39.0–52.0)
Hemoglobin: 11.2 g/dL — ABNORMAL LOW (ref 13.0–17.0)
Potassium: 4.6 mmol/L (ref 3.5–5.1)
Sodium: 130 mmol/L — ABNORMAL LOW (ref 135–145)
TCO2: 23 mmol/L (ref 22–32)

## 2024-05-16 LAB — POC OCCULT BLOOD, ED: Fecal Occult Bld: POSITIVE — AB

## 2024-05-16 LAB — CBC
HCT: 33.1 % — ABNORMAL LOW (ref 39.0–52.0)
Hemoglobin: 11.5 g/dL — ABNORMAL LOW (ref 13.0–17.0)
MCH: 31.3 pg (ref 26.0–34.0)
MCHC: 34.7 g/dL (ref 30.0–36.0)
MCV: 90.2 fL (ref 80.0–100.0)
Platelets: 308 K/uL (ref 150–400)
RBC: 3.67 MIL/uL — ABNORMAL LOW (ref 4.22–5.81)
RDW: 12.3 % (ref 11.5–15.5)
WBC: 9.5 K/uL (ref 4.0–10.5)
nRBC: 0 % (ref 0.0–0.2)

## 2024-05-16 LAB — C-REACTIVE PROTEIN: CRP: 0.7 mg/dL (ref <1.0)

## 2024-05-16 LAB — LIPASE, BLOOD: Lipase: 24 U/L (ref 11–51)

## 2024-05-16 LAB — COMPREHENSIVE METABOLIC PANEL WITH GFR
ALT: 55 U/L — ABNORMAL HIGH (ref 0–44)
AST: 27 U/L (ref 15–41)
Albumin: 3 g/dL — ABNORMAL LOW (ref 3.5–5.0)
Alkaline Phosphatase: 69 U/L (ref 38–126)
Anion gap: 8 (ref 5–15)
BUN: 17 mg/dL (ref 8–23)
CO2: 25 mmol/L (ref 22–32)
Calcium: 8.2 mg/dL — ABNORMAL LOW (ref 8.9–10.3)
Chloride: 96 mmol/L — ABNORMAL LOW (ref 98–111)
Creatinine, Ser: 0.94 mg/dL (ref 0.61–1.24)
GFR, Estimated: >60 mL/min (ref >60)
Glucose, Bld: 130 mg/dL — ABNORMAL HIGH (ref 70–99)
Potassium: 4.6 mmol/L (ref 3.5–5.1)
Sodium: 129 mmol/L — ABNORMAL LOW (ref 135–145)
Total Bilirubin: 1 mg/dL (ref 0.0–1.2)
Total Protein: 5.7 g/dL — ABNORMAL LOW (ref 6.5–8.1)

## 2024-05-16 LAB — TYPE AND SCREEN
ABO/RH(D): B POS
Antibody Screen: NEGATIVE

## 2024-05-16 LAB — SEDIMENTATION RATE: Sed Rate: 13 mm/h (ref 0–16)

## 2024-05-16 LAB — ABO/RH: ABO/RH(D): B POS

## 2024-05-16 LAB — ACETAMINOPHEN LEVEL: Acetaminophen (Tylenol), Serum: <10 ug/mL — ABNORMAL LOW (ref 10–30)

## 2024-05-16 LAB — TROPONIN I (HIGH SENSITIVITY): Troponin I (High Sensitivity): 4 ng/L (ref <18)

## 2024-05-16 MED ORDER — MORPHINE SULFATE (PF) 4 MG/ML IV SOLN
4.0000 mg | Freq: Once | INTRAVENOUS | Status: DC
  Filled 2024-05-16: qty 1

## 2024-05-16 MED ORDER — ACETAMINOPHEN 500 MG PO TABS: 1000.0000 mg | ORAL_TABLET | Freq: Four times a day (QID) | ORAL | Status: DC | PRN

## 2024-05-16 MED ORDER — OXYCODONE HCL 5 MG PO TABS: 5.0000 mg | ORAL_TABLET | Freq: Four times a day (QID) | ORAL | Status: AC | PRN

## 2024-05-16 MED ORDER — ONDANSETRON HCL 4 MG/2ML IJ SOLN
4.0000 mg | Freq: Once | INTRAMUSCULAR | Status: DC
  Filled 2024-05-16: qty 2

## 2024-05-16 MED ORDER — IOHEXOL 350 MG/ML SOLN
75.0000 mL | Freq: Once | INTRAVENOUS | Status: AC | PRN
Start: 2024-05-16 — End: 2024-05-16
  Administered 2024-05-16: 75 mL via INTRAVENOUS

## 2024-05-16 MED ORDER — PANTOPRAZOLE SODIUM 40 MG IV SOLR
40.0000 mg | Freq: Two times a day (BID) | INTRAVENOUS | Status: DC
  Administered 2024-05-16 – 2024-05-17 (×3): 40 mg via INTRAVENOUS
  Filled 2024-05-16 (×4): qty 10

## 2024-05-16 NOTE — Hospital Course (Signed)
 Epigastric pain Hematochezia ?gastric artery aneurysm Ascending aortic aneurysm Hyponatremia Anemia Hfref  Pain came back a few days after going home, managed it with ibuprofen . Yesterday morning saw lots of blood in stool. Haven't had anything to eat, it's his second day, slight pain 2/10, pain mostly in the back, paused miloxicam+spirinolactone, used to take miloxi x4/week,

## 2024-05-16 NOTE — H&P (Signed)
 Date: 05/16/2024         Patient Name:  Edward [name] MRN: [id number]  DOB: [birth year] Age / Sex: 73 y.o., male   PCP: Edgardo Pontiff, DO         Medical Service: Internal Medicine Teaching Service         Attending Physician: Dr. Francesco Elsie NOVAK, MD    First Contact: Dr. Waymond Pager: [phone]  Second Contact: Dr. Nelia Pager: [phone]                 Chief Concern: Upper abdominal pain, pooping blood  History of Present Illness: 73 year old who was recently hospitalized from September 9 to September 13 and treated for pancreatitis and hyponatremia comes back to the hospital for upper abdominal pain and bright red blood per rectum.  He felt well for a couple of days after discharge.  Then the upper abdominal pain that he had before started to return and become severe.  This morning he had a bloody bowel movement that was maroon in color and turned all the water red.  He called his PCP office who recommended he go to the emergency department.  No hematemesis.  He reports a little bit of associated wooziness.  He thinks his upper abdominal pain is related to mealtimes, seems to be worse after eating.  After arriving in the ED his pain improved and now he is basically pain-free.  He has not had any more bloody bowel movements since being in the hospital.  In the ED a CTA of the abdomen was done and it showed a possible gastric artery aneurysm about 7 mm in size.  Interventional radiology was consulted and recommended admission, they will see him in the morning for this potential aneurysm. Labs are notable for hyponatremia, stable from recent discharge.  Lipase 24.  Marginal ALT elevation.  Stable creatinine.  Mild normocytic anemia.  Normal CRP and sed rate.  No personal or family history of vascular disease or bleeding disorders.  He has a history of gastroesophageal reflux.  He had a stroke around 10 years ago and is on a daily aspirin  and statin now.  He has chronic well compensated  HFrEF.  He has been taking salt tablets and limiting his water intake for his low sodium.  No recent surgeries.  Lives in [place] with his wife.  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  Enjoying retirement.  Looking forward to getting back to work.  Per prior H&P non-smoker and drinks a glass of wine with dinner daily.  ROS per above.  Allergies: No Known Allergies  Past Medical History: Patient Active Problem List   Diagnosis Date Noted   Gastric artery aneurysm (HCC) 05/16/2024   Acute cystitis without hematuria 05/12/2024   Hyponatremia 05/07/2024   BPH (benign prostatic hyperplasia) 08/22/2023   GERD (gastroesophageal reflux disease) 11/15/2022   Esophageal dysphagia 11/15/2022   Seasonal allergies 11/15/2022   Ischemic vascular disease 11/15/2022   Chronic sinusitis 09/09/2022   Chronic systolic heart failure (HCC) 07/02/2022   Myalgia due to statin 07/28/2021   Mixed hyperlipidemia 06/11/2020   Solitary pulmonary nodule 08/08/2019   Umbilical hernia 08/08/2019   Elevated LDL cholesterol level 08/08/2019   Ascending aorta dilation (HCC) 05/24/2019   Benign head tremor 05/24/2019   Abnormal EKG 05/29/2018   Benign essential HTN 05/29/2018   Past Medical History:  Diagnosis Date   Abnormal EKG 05/29/2018   Sinus bradycardia and LAFB  with TWI V4-V6, III and aVF   Benign essential HTN 05/29/2018   Cardiomyopathy (HCC)    a. dx by echo 07/2020.   Dilation of aorta (HCC)    Environmental allergies    Fracture, ribs    H/O: varicose veins    Hearing loss    Hyperlipidemia LDL goal <70    Multiple food allergies    Rash    Seasonal allergies    Sinusitis    Stroke [facility])    s/p tpa    Medications: No current facility-administered medications on file prior to encounter.   Current Outpatient Medications on File Prior to Encounter  Medication Sig Dispense Refill   acetaminophen  (TYLENOL ) 650 MG CR tablet Take 1,300 mg by mouth  every 8 (eight) hours as needed for pain.     aspirin  EC 81 MG tablet Take 81 mg by mouth at bedtime. Swallow whole.     Coenzyme Q10 (COQ-10) 100 MG CAPS Take 1 capsule by mouth in the morning and at bedtime.     finasteride  (PROSCAR ) 5 MG tablet TAKE 1 TABLET (5 MG TOTAL) BY MOUTH DAILY. 90 tablet 1   ibuprofen  (ADVIL ) 200 MG tablet Take 400 mg by mouth every 6 (six) hours as needed for mild pain (pain score 1-3).     metoprolol  succinate (TOPROL -XL) 25 MG 24 hr tablet TAKE 1 TABLET (25 MG) BY MOUTH DAILY. APPOINTMENT NEEDED FOR CONTINUATION OF REFILLS-1ST ATTEMPT (Patient taking differently: Take 25 mg by mouth at bedtime. Take 1 tablet (25 mg) by mouth daily. Appointment needed for continuation of refills-1st attempt) 90 tablet 3   albuterol  (VENTOLIN  HFA) 108 (90 Base) MCG/ACT inhaler Inhale 2 puffs into the lungs every 4 (four) hours as needed for wheezing or shortness of breath. (Patient not taking: Reported on 05/08/2024)     [Paused] meloxicam  (MOBIC ) 15 MG tablet Take 1 tablet (15 mg total) by mouth daily as needed for pain. (Patient not taking: Reported on 05/16/2024) 30 tablet 0   Multiple Vitamin (MULTIVITAMIN WITH MINERALS) TABS tablet Take 1 tablet by mouth daily.     N-ACETYL CYSTEINE 600 MG TABS Take 1 tablet by mouth every morning.     omeprazole  (PRILOSEC) 20 MG capsule TAKE 1 CAPSULE BY MOUTH EVERY DAY 90 capsule 1   OVER THE COUNTER MEDICATION Take 1 capsule by mouth every morning. PQQ: Pyrroloquinoline Quinone Take one capsule by mouth daily     RA CRANBERRY 500 MG CAPS Take 1 capsule by mouth in the morning and at bedtime.     rosuvastatin  (CRESTOR ) 10 MG tablet TAKE 1 TABLET BY MOUTH EVERY DAY (Patient taking differently: Take 10 mg by mouth 4 (four) times a week. Take one tablet (10mg ) by mouth at bedtime.) 90 tablet 1   sodium chloride  1 g tablet Take 2 tablets (2 g total) by mouth 3 (three) times daily with meals. 180 tablet 0   [Paused] spironolactone  (ALDACTONE ) 25 MG tablet  Take 0.5 tablets (12.5 mg total) by mouth daily. (Patient taking differently: Take 12.5 mg by mouth at bedtime.) 45 tablet 3   tamsulosin  (FLOMAX ) 0.4 MG CAPS capsule TAKE 1 CAPSULE BY MOUTH EVERY DAY (Patient taking differently: Take 0.4 mg by mouth at bedtime.) 90 capsule 1     Surgical History: Past Surgical History:  Procedure Laterality Date   COLONOSCOPY     INGUINAL HERNIA REPAIR Left    right hand surgery      Family History:  Family History  Problem Relation  Age of Onset   CVA Mother    Heart failure Father        CHF   Brain cancer Brother        GBM   Colon polyps Neg Hx    Colon cancer Neg Hx    Esophageal cancer Neg Hx    Rectal cancer Neg Hx    Stomach cancer Neg Hx     Social History:  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  Tobacco Use   Smoking status: Former   Smokeless tobacco: Never   Tobacco comments:    quit 40+ years ago  Vaping Use   Vaping status: Never Used  Substance and Sexual Activity   Alcohol use: Yes    Comment: wine 3 x a week   Drug use: Never   Sexual activity: Yes    Partners: Female  Other Topics Concern   Not on file  Social History Narrative   Not on file   Social Drivers of Health   Financial Resource Strain: Low Risk  (01/19/2023)   Overall Financial Resource Strain (CARDIA)    Difficulty of Paying Living Expenses: Not hard at all  Food Insecurity: No Food Insecurity (05/14/2024)   Hunger Vital Sign    Worried About Running Out of Food in the Last Year: Never true    Ran Out of Food in the Last Year: Never true  Transportation Needs: No Transportation Needs (05/14/2024)   PRAPARE - Administrator, Civil Service (Medical): No    Lack of Transportation (Non-Medical): No   Physical Activity: Sufficiently Active (01/19/2023)   Exercise Vital Sign    Days of Exercise per Week: 7 days    Minutes of Exercise per Session: 30 min  Stress: No Stress Concern Present (01/19/2023)   [employer] of Occupational Health - Occupational Stress Questionnaire    Feeling of Stress : Not at all  Social Connections: Socially Integrated (05/08/2024)   Social Connection and Isolation Panel    Frequency of Communication with Friends and Family: More than three times a week    Frequency of Social Gatherings with Friends and Family: More than three times a week    Attends Religious Services: More than 4 times per year    Active Member of [employer] or Organizations: Yes    Attends Engineer, structural: More than 4 times per year    Marital Status: Married  Catering manager Violence: Not At Risk (05/14/2024)   Humiliation, Afraid, Rape, and Kick questionnaire    Fear of Current or Ex-Partner: No    Emotionally Abused: No    Physically Abused: No    Sexually Abused: No    Physical Exam: Blood pressure (!) 127/94, pulse 92, temperature (!) 97.4 F (36.3 C), temperature source Oral, resp. rate 18, SpO2 97%.  Well-appearing Heart rate and rhythm normal, strong radial pulses, no murmurs Breathing comfortably on room air, no wheezing anteriorly Skin is warm and dry Abdomen is soft with minimal epigastric tenderness Rectal exam was deferred Alert and oriented, no facial asymmetry, speech is normal sounding, moving extremities  Labs: Per above  Images and other studies: Per above  Assessment & Plan:  Edward CROME  [name] is a 73 y.o. with a remote history of stroke without deficits, well compensated cardiomyopathy who presents with recurrent epigastric abdominal pain and hematochezia and is found to have a suspected gastric artery aneurysm, admitted for management of this.  Principal Problem:   Gastric artery aneurysm (HCC) Currently stable.  Might be an explanation for  the colicky type epigastric pain and hematochezia.  If so the vessel is exposed to the lumen of the stomach so I will start a PPI.  IR will see tomorrow.  If he becomes hemodynamically stable overnight I would give blood and stat page IR.  I wonder if this is a complication of his recent pancreatitis.  CRP and ESR are normal, wonder about an underlying vasculitis with this aneurysm, the mild ascending aortic aneurysm, hepatic artery abnormality on imaging.  Could also be due to systemic ASCVD. - Protonix  40 mg IV every 12 hours  Active Problems:   Abdominal pain, epigastric Improving. No signs suggestive of recurrent acute pancreatitis.  Lipase normal, pancreas looks improved from prior CT.  Query referred pain from the gastric artery aneurysm or peptic ulcer disease.  PPI, per above. Tylenol  and oxycodone  PRN for pain control.    GERD (gastroesophageal reflux disease) Chronic.  Takes oral PPI outpatient for this.  Never had an EGD.  PUD may be on the differential, he may need an EGD at some point but not urgently overnight, GI not consulted.  PPI, per above.    Hematochezia Query stuttering bleeding from a briskly bleeding gastric artery aneurysm.  He had a colonoscopy a few years ago was normal.  I did not do a rectal exam tonight.  Hold antiplatelets.    Anemia Mild.  Probably from blood loss.  Check CBC in AM.  Hold antiplatelets.    Benign essential HTN Chronic, stable.  Blood pressure is okay on admission.  Holding outpatient antihypertensives.    Ascending aorta dilation (HCC) No acute abnormality or dissection on CTA.  Annual follow-up by CTA or MRI outpatient.  Outpatient statin held.    Nonischemic cardiomyopathy (HCC) Chronic, stable.  EF 45 to 50% per echo in January 2025.  Well compensated, no heart failure syndrome tonight.  Holding outpatient metoprolol .    BPH (benign prostatic hyperplasia) Chronic, stable.  On outpatient finasteride  and tamsulosin , these are being held.     Hyponatremia Thought to be due to SIADH.  Offending medications have been appropriately held.  Sodium stable, 130, same as recent discharge.  Continue fluid restriction.  Level of care: med surg Diet: clear liquids, npo at midnight VTE: SCDs Start: 05/16/24 1900 Code: full Surrogate: wife, Dorothyann Jolee  Signed: Ozell Kung MD 05/16/2024, 7:27 PM Pager: [phone]

## 2024-05-16 NOTE — ED Provider Notes (Signed)
  EMERGENCY DEPARTMENT AT [facility] Provider Note   CSN: [id number] Arrival date & time: 05/16/24  9283     Patient presents with: Abdominal Pain and Rectal Bleeding  HPI Edward [name] is a 73 y.o. male with CHF, ascending aortic dilation, history of stroke, cardiomyopathy, hyperlipidemia presenting for abdominal pain.  Started about 2 days ago.  Was recently discharged on Saturday for acute pancreatitis.  He states that the pain is very similar.  It is in the epigastric region and radiates to his back.  He denies chest pain or shortness of breath.  Endorses some nausea but no vomiting or diarrhea.  Does report this morning that he started to notice maroon-colored blood in his stool.  He states this occurred 3-4 times and it was a considerable amount.  Denies lightheadedness or fatigue.  States he did take a lot of Tylenol  and ibuprofen  the last couple days because of the abdominal pain.    Abdominal Pain Associated symptoms: hematochezia   Rectal Bleeding Associated symptoms: abdominal pain        Prior to Admission medications   Medication Sig Start Date End Date Taking? Authorizing Provider  acetaminophen  (TYLENOL ) 650 MG CR tablet Take 1,300 mg by mouth every 8 (eight) hours as needed for pain.    [provider]  albuterol  (VENTOLIN  HFA) 108 (90 Base) MCG/ACT inhaler Inhale 2 puffs into the lungs every 4 (four) hours as needed for wheezing or shortness of breath. Patient not taking: Reported on 05/08/2024 05/06/24   Raford Alm, MD  aspirin  EC 81 MG tablet Take 81 mg by mouth daily. Swallow whole.    [provider]  Coenzyme Q10 (COQ-10) 100 MG CAPS Take 1 capsule by mouth in the morning and at bedtime. 11/02/21   [provider]  finasteride  (PROSCAR ) 5 MG tablet TAKE 1 TABLET (5 MG TOTAL) BY MOUTH DAILY. 01/09/24 01/08/25  Arellano Zameza, Priscila, MD  ibuprofen  (ADVIL ) 200 MG tablet Take 400 mg by mouth every 6 (six) hours as needed  for mild pain (pain score 1-3).    [provider]  meloxicam  (MOBIC ) 15 MG tablet Take 1 tablet (15 mg total) by mouth daily as needed for pain. 04/19/24   Syeda, Raeeha, DO  metoprolol  succinate (TOPROL -XL) 25 MG 24 hr tablet TAKE 1 TABLET (25 MG) BY MOUTH DAILY. APPOINTMENT NEEDED FOR CONTINUATION OF REFILLS-1ST ATTEMPT 01/24/24   Shlomo Wilbert SAUNDERS, MD  Multiple Vitamin (MULTIVITAMIN WITH MINERALS) TABS tablet Take 1 tablet by mouth daily.    [provider]  N-ACETYL CYSTEINE 600 MG TABS Take 1 tablet by mouth every morning. 11/02/21   [provider]  omeprazole  (PRILOSEC) 20 MG capsule TAKE 1 CAPSULE BY MOUTH EVERY DAY 01/27/24   Arellano Zameza, Priscila, MD  OVER THE COUNTER MEDICATION Take 1 capsule by mouth every morning. PQQ: Pyrroloquinoline Quinone Take one capsule by mouth daily    [provider]  RA CRANBERRY 500 MG CAPS Take 1 capsule by mouth in the morning and at bedtime. 11/02/21   [provider]  rosuvastatin  (CRESTOR ) 10 MG tablet TAKE 1 TABLET BY MOUTH EVERY DAY Patient taking differently: Take 10 mg by mouth 4 (four) times a week. Take one tablet (10mg ) by mouth at bedtime. 12/20/23   Arellano Zameza, Priscila, MD  sodium chloride  1 g tablet Take 2 tablets (2 g total) by mouth 3 (three) times daily with meals. 05/12/24   Waymond Cart, MD  spironolactone  (ALDACTONE ) 25 MG  tablet Take 0.5 tablets (12.5 mg total) by mouth daily. Patient taking differently: Take 12.5 mg by mouth at bedtime. 01/27/24   Shlomo Wilbert SAUNDERS, MD  tamsulosin  (FLOMAX ) 0.4 MG CAPS capsule TAKE 1 CAPSULE BY MOUTH EVERY DAY Patient taking differently: Take 0.4 mg by mouth at bedtime. 01/24/24   Arellano Zameza, Priscila, MD    Allergies: Patient has no known allergies.    Review of Systems  Gastrointestinal:  Positive for abdominal pain and hematochezia.    Updated Vital Signs BP (!) 127/94 (BP Location: Right Arm)   Pulse 92   Temp (!) 97.4 F (36.3 C) (Oral)   Resp  18   SpO2 97%   Physical Exam Vitals and nursing note reviewed. Exam conducted with a chaperone present.  HENT:     Head: Normocephalic and atraumatic.     Mouth/Throat:     Mouth: Mucous membranes are moist.  Eyes:     General:        Right eye: No discharge.        Left eye: No discharge.     Conjunctiva/sclera: Conjunctivae normal.  Cardiovascular:     Rate and Rhythm: Normal rate and regular rhythm.     Pulses: Normal pulses.     Heart sounds: Normal heart sounds.  Pulmonary:     Effort: Pulmonary effort is normal.     Breath sounds: Normal breath sounds.  Abdominal:     General: Abdomen is flat.     Palpations: Abdomen is soft.     Tenderness: There is abdominal tenderness in the epigastric area.  Genitourinary:    Rectum: Guaiac result positive. No mass, tenderness, anal fissure or external hemorrhoid.     Comments: Brown-colored stool noted on exam.  Small amount. Skin:    General: Skin is warm and dry.  Neurological:     General: No focal deficit present.  Psychiatric:        Mood and Affect: Mood normal.     (all labs ordered are listed, but only abnormal results are displayed) Labs Reviewed  COMPREHENSIVE METABOLIC PANEL WITH GFR - Abnormal; Notable for the following components:      Result Value   Sodium 129 (*)    Chloride 96 (*)    Glucose, Bld 130 (*)    Calcium  8.2 (*)    Total Protein 5.7 (*)    Albumin 3.0 (*)    ALT 55 (*)    All other components within normal limits  CBC - Abnormal; Notable for the following components:   RBC 3.67 (*)    Hemoglobin 11.5 (*)    HCT 33.1 (*)    All other components within normal limits  ACETAMINOPHEN  LEVEL - Abnormal; Notable for the following components:   Acetaminophen  (Tylenol ), Serum <10 (*)    All other components within normal limits  POC OCCULT BLOOD, ED - Abnormal; Notable for the following components:   Fecal Occult Bld POSITIVE (*)    All other components within normal limits  I-STAT CHEM 8, ED -  Abnormal; Notable for the following components:   Sodium 130 (*)    Chloride 95 (*)    Glucose, Bld 132 (*)    Calcium , Ion 1.10 (*)    Hemoglobin 11.2 (*)    HCT 33.0 (*)    All other components within normal limits  LIPASE, BLOOD  C-REACTIVE PROTEIN  URINALYSIS, W/ REFLEX TO CULTURE (INFECTION SUSPECTED)  SEDIMENTATION RATE  TYPE AND SCREEN  ABO/RH  TROPONIN  I (HIGH SENSITIVITY)    EKG: None  Radiology: CT Angio Chest/Abd/Pel for Dissection W and/or W/WO Result Date: 05/16/2024 CLINICAL DATA:  Provided history: Acute aortic syndrome (AAS) suspected Technologist notes state abdominal pain.  Rectal bleeding. EXAM: CT ANGIOGRAPHY CHEST, ABDOMEN AND PELVIS TECHNIQUE: Non-contrast CT of the chest was initially obtained. Multidetector CT imaging through the chest, abdomen and pelvis was performed using the standard protocol during bolus administration of intravenous contrast. Multiplanar reconstructed images and MIPs were obtained and reviewed to evaluate the vascular anatomy. RADIATION DOSE REDUCTION: This exam was performed according to the departmental dose-optimization program which includes automated exposure control, adjustment of the mA and/or kV according to patient size and/or use of iterative reconstruction technique. CONTRAST:  75mL OMNIPAQUE  IOHEXOL  350 MG/ML SOLN COMPARISON:  Abdominopelvic CT 05/07/2024, chest CT 12/21/2022, abdominal CTA 08/08/2019 FINDINGS: CTA CHEST FINDINGS Cardiovascular: No aortic hematoma on unenhanced exam. Mildly aneurysmal ascending aorta at 4.1 cm. No dissection or acute aortic findings. No pulmonary embolus to the segmental level. The heart is borderline enlarged. No pericardial effusion. Mediastinum/Nodes: No mediastinal or hilar adenopathy. The esophagus is decompressed, tiny hiatal hernia. No thyroid  nodule. Lungs/Pleura: Elevated right hemidiaphragm. There is increasing atelectasis at the right lung base from recent abdominopelvic CT. 6-7 mm left lower  lobe pulmonary nodule, series 7, image 120, unchanged over serial exams. Additional punctate left lower lobe nodule, series 7, image 94, is also unchanged. Again seen mild biapical pleuroparenchymal scarring. No features of pulmonary edema. No pleural effusion. Trachea and central airways are clear. Musculoskeletal: Right glenohumeral arthropathy. Mild degenerative change in the thoracic spine. There are no acute or suspicious osseous abnormalities. Review of the MIP images confirms the above findings. CTA ABDOMEN AND PELVIS FINDINGS VASCULAR Aorta: Normal caliber aorta without aneurysm, dissection, vasculitis or significant stenosis. Celiac: Separate origin of the hepatic and splenic arteries from the upper abdominal aorta. Questionable soft tissue thickening about the hepatic artery, reference series 9, image 74. An additional tiny artery arising from the abdominal aorta above the hepatic and celiac arteries, may be a small gastric artery, demonstrates 7 mm aneurysm, series 6, image 133. No adjacent stranding. SMA: Patent without evidence of aneurysm, dissection, vasculitis or significant stenosis. Renals: Both renal arteries are patent without evidence of aneurysm, dissection, vasculitis, fibromuscular dysplasia or significant stenosis. IMA: Patent without evidence of aneurysm, dissection, vasculitis or significant stenosis. Inflow: Patent without evidence of aneurysm, dissection, vasculitis or significant stenosis. Veins: No obvious venous abnormality within the limitations of this arterial phase study. Review of the MIP images confirms the above findings. NON-VASCULAR Hepatobiliary: No focal abnormality on this arterial phase exam. Gallbladder physiologically distended, no calcified stone. No biliary dilatation. Pancreas: Improved peripancreatic edema from prior exam. No definite residual pancreatic inflammation. No pancreatic ductal dilatation. No peripancreatic collection. Spleen: Normal in size and arterial  enhancement. Adrenals/Urinary Tract: No adrenal nodule. Again seen extrarenal pelvis configuration of the left kidney. No hydronephrosis. Again seen diffuse bladder wall thickening. Stomach/Bowel: Small hiatal hernia. Colonic diverticulosis without diverticulitis. No evidence of bowel inflammation or obstruction. The appendix is not definitively seen. There is no contrast accumulating in the GI tract to suggest GI bleed. Lymphatic: No enlarged lymph nodes in the abdomen or pelvis. Reproductive: Unremarkable prostate. Other: Diminished free fluid in the pelvis from recent CT. Moderate fat containing left inguinal hernia. Small fat containing umbilical hernia. Musculoskeletal: Mild scoliosis and degenerative change in the spine. Review of the MIP images confirms the above findings. IMPRESSION: 1. No aortic dissection or  acute aortic abnormality. Mildly aneurysmal ascending thoracic aorta at 4.1 cm. Recommend annual imaging follow-up by CTA or MRA. 2. No evidence of GI bleed. 3. Questionable soft tissue thickening about the hepatic artery which may be secondary to vasculitis/inflammation. 4. 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 Consider IR consultation. 5. Improved peripancreatic edema from prior exam. No definite residual pancreatic inflammation. 6. Colonic diverticulosis without diverticulitis. 7. Diffuse bladder wall thickening, also present on prior. This may be due to chronic bladder outlet obstruction or cystitis. 8. Left lower lobe pulmonary nodules measuring up to 6-7 mm, unchanged over serial exams. No specific imaging follow-up is needed. 9. Small hiatal hernia. 10. Fat containing left inguinal and umbilical hernias. Electronically Signed   By: Andrea Gasman M.D.   On: 05/16/2024 17:03   DG Chest Portable 1 View Result Date: 05/16/2024 CLINICAL DATA:  Acute epigastric abdominal pain. EXAM: PORTABLE CHEST 1 VIEW COMPARISON:  May 07, 2024. FINDINGS: The heart size and mediastinal contours are within normal limits. Minimal bibasilar subsegmental atelectasis is noted. The visualized skeletal structures are unremarkable. IMPRESSION: Minimal bibasilar subsegmental atelectasis. Electronically Signed   By: Lynwood Landy Raddle M.D.   On: 05/16/2024 16:04     Procedures   Medications Ordered in the ED  morphine  (PF) 4 MG/ML injection 4 mg (0 mg Intravenous Hold 05/16/24 1608)  ondansetron  (ZOFRAN ) injection 4 mg (0 mg Intravenous Hold 05/16/24 1608)  iohexol  (OMNIPAQUE ) 350 MG/ML injection 75 mL (75 mLs Intravenous Contrast Given 05/16/24 1624)    Clinical Course as of 05/16/24 1853  Wed May 16, 2024  1739 Discussed with IR, Dr. Juliene Balder, crp and sed rate. Clears.  [JR]    Clinical Course User Index [JR] Lang Norleen POUR, PA-C                                 Medical Decision Making Amount and/or Complexity of Data Reviewed Labs: ordered. Radiology: ordered.  Risk Prescription drug management. Decision regarding hospitalization.   Initial Impression and Ddx 73 year old well-appearing male presenting for abdominal pain.  Exam notable for epigastric tenderness and hematochezia.  DDx includes acute pancreatitis, aortic dissection, ACS, AVM, malignancy, symptomatic anemia, AKI, other. Patient PMH that increases complexity of ED encounter:  recent admission for pancreatitis, CHF, ascending aortic dilation, history of stroke, cardiomyopathy, hyperlipidemia  Interpretation of Diagnostics - I independent reviewed and interpreted the labs as followed: Anemia 11.5, mild hyponatremia 129  - I independently visualized the following imaging with scope of interpretation limited to determining acute life threatening conditions related to emergency care: CT angio, which revealed negative for dissection, concern for gastric aneurysm, also findings suggestive of vasculitis it was well around the hepatic artery.   Patient Reassessment  and Ultimate Disposition/Management On reassessment pain was somewhat improved.  Discussed patient with Dr. Balder of IR who was concerned about the CTA findings possible gastric aneurysm and finding suggestive of vasculitis.  He advised sed rate and CRP and to admit to hospital.  Advise clear liquids for now.  He also stated that his team would see him in the morning.  Admitted to hospital service with Dr. Norrine.  Patient management required discussion with the following services or consulting groups:  Hospitalist Service and IR  Complexity of Problems Addressed Acute complicated illness or Injury  Additional Data Reviewed and Analyzed Further history obtained from:  Past medical history and medications listed in the EMR and Prior ED visit notes  Patient Encounter Risk Assessment Consideration of hospitalization      Final diagnoses:  Abdominal pain, unspecified abdominal location  Bloody stools    ED Discharge Orders     None          Lang Norleen POUR, PA-C 05/16/24 2320    Lowther, Amy, DO 05/17/24 1655

## 2024-05-16 NOTE — ED Notes (Signed)
 Transport called to transfer patient to IP unit. Patient and family at bedside made aware

## 2024-05-16 NOTE — ED Triage Notes (Signed)
 Pt. Stated, I was just discharged on Saturday from pancreatitis and its never left me. Im still having the abdominal pain and Ive started pooping blood. I called our Dr. And he said to come here.

## 2024-05-17 ENCOUNTER — Encounter (HOSPITAL_COMMUNITY): Payer: Self-pay | Admitting: Infectious Diseases

## 2024-05-17 ENCOUNTER — Encounter: Admitting: Student

## 2024-05-17 DIAGNOSIS — I728 Aneurysm of other specified arteries: Secondary | ICD-10-CM

## 2024-05-17 DIAGNOSIS — R1013 Epigastric pain: Secondary | ICD-10-CM | POA: Diagnosis not present

## 2024-05-17 DIAGNOSIS — K921 Melena: Secondary | ICD-10-CM | POA: Diagnosis not present

## 2024-05-17 DIAGNOSIS — Z8719 Personal history of other diseases of the digestive system: Secondary | ICD-10-CM | POA: Diagnosis not present

## 2024-05-17 DIAGNOSIS — R109 Unspecified abdominal pain: Secondary | ICD-10-CM

## 2024-05-17 DIAGNOSIS — Z8673 Personal history of transient ischemic attack (TIA), and cerebral infarction without residual deficits: Secondary | ICD-10-CM

## 2024-05-17 DIAGNOSIS — Z791 Long term (current) use of non-steroidal anti-inflammatories (NSAID): Secondary | ICD-10-CM

## 2024-05-17 LAB — BASIC METABOLIC PANEL WITH GFR
Anion gap: 10 (ref 5–15)
BUN: 15 mg/dL (ref 8–23)
CO2: 22 mmol/L (ref 22–32)
Calcium: 8 mg/dL — ABNORMAL LOW (ref 8.9–10.3)
Chloride: 95 mmol/L — ABNORMAL LOW (ref 98–111)
Creatinine, Ser: 0.78 mg/dL (ref 0.61–1.24)
GFR, Estimated: >60 mL/min (ref >60)
Glucose, Bld: 96 mg/dL (ref 70–99)
Potassium: 4.4 mmol/L (ref 3.5–5.1)
Sodium: 127 mmol/L — ABNORMAL LOW (ref 135–145)

## 2024-05-17 LAB — CBC
HCT: 27.7 % — ABNORMAL LOW (ref 39.0–52.0)
Hemoglobin: 9.7 g/dL — ABNORMAL LOW (ref 13.0–17.0)
MCH: 30.9 pg (ref 26.0–34.0)
MCHC: 35 g/dL (ref 30.0–36.0)
MCV: 88.2 fL (ref 80.0–100.0)
Platelets: 249 K/uL (ref 150–400)
RBC: 3.14 MIL/uL — ABNORMAL LOW (ref 4.22–5.81)
RDW: 12.4 % (ref 11.5–15.5)
WBC: 8.9 K/uL (ref 4.0–10.5)
nRBC: 0 % (ref 0.0–0.2)

## 2024-05-17 LAB — FERRITIN: Ferritin: 118 ng/mL (ref 24–336)

## 2024-05-17 LAB — FOLATE: Folate: 18.2 ng/mL (ref >5.9)

## 2024-05-17 LAB — IRON AND TIBC
Iron: 59 ug/dL (ref 45–182)
Saturation Ratios: 22 % (ref 17.9–39.5)
TIBC: 269 ug/dL (ref 250–450)
UIBC: 210 ug/dL

## 2024-05-17 LAB — RETICULOCYTES
Immature Retic Fract: 11.2 % (ref 2.3–15.9)
RBC.: 3.37 MIL/uL — ABNORMAL LOW (ref 4.22–5.81)
Retic Count, Absolute: 67.1 K/uL (ref 19.0–186.0)
Retic Ct Pct: 2 % (ref 0.4–3.1)

## 2024-05-17 LAB — VITAMIN B12: Vitamin B-12: 353 pg/mL (ref 180–914)

## 2024-05-17 MED ORDER — SODIUM CHLORIDE 0.9 % IV SOLN: INTRAVENOUS | Status: DC

## 2024-05-17 MED ORDER — SODIUM CHLORIDE 1 G PO TABS
2.0000 g | ORAL_TABLET | Freq: Three times a day (TID) | ORAL | Status: DC
  Administered 2024-05-17 – 2024-05-18 (×2): 2 g via ORAL
  Filled 2024-05-17 (×2): qty 2

## 2024-05-17 NOTE — Consult Note (Signed)
 Chief Complaint: Patient was seen in consultation today for GI bleed  Referring Physician(s): Dr. Letha Cheadle  Supervising Physician: Jenna Hacker  Patient Status: [facility] - In-pt  History of Present Illness: Edward [name] is a 73 y.o. male admitted with epigastric pain, hematochezia, melena in the setting of recent pancreatitis.  CT imaging obtained  and identified a possible gastric artery aneurysm.  Vasculitis work-up thus far negative with normal ESR/CRP however given the recent change with new aneurysm not seen on recent imaging 05/07/24, IR consulted for angiogram with possible intervention.    Case reviewed and approved by Dr. Jenna.   PA to bedside to discuss with patient and his wife.  They are aware patient with complex medical issues currently under investigation.  His work-up is likely to included an EGD possibly as early as tomorrow which may show the more acute source of his bleed.  Given the recent development of the newly identified gastric artery aneurysm, will plan to proceed with angiogram as schedule allows, however further work-up can likely be safely accomplished as an outpatient as well.  Given the potential navigational challenges of cannulating the arteries in the area of concern, patient aware that further intervention may not be possible at the time of the procedure tomorrow and further work-up may be needed regardless.   Patient and wife are agreeable to proceed with angiogram, possible intervention tomorrow as schedule allows.   Past Medical History:  Diagnosis Date   Abnormal EKG 05/29/2018   Sinus bradycardia and LAFB with TWI V4-V6, III and aVF   Benign essential HTN 05/29/2018   Cardiomyopathy (HCC)    a. dx by echo 07/2020.   Dilation of aorta (HCC)    Environmental allergies    Fracture, ribs    H/O: varicose veins    Hearing loss    Hyperlipidemia LDL goal <70    Multiple food allergies    Rash    Seasonal allergies    Sinusitis    Stroke  [facility])    s/p tpa    Past Surgical History:  Procedure Laterality Date   COLONOSCOPY     INGUINAL HERNIA REPAIR Left    right hand surgery      Allergies: Patient has no known allergies.  Medications: Prior to Admission medications   Medication Sig Start Date End Date Taking? Authorizing Provider  acetaminophen  (TYLENOL ) 650 MG CR tablet Take 1,300 mg by mouth every 8 (eight) hours as needed for pain.   Yes [provider]  aspirin  EC 81 MG tablet Take 81 mg by mouth at bedtime. Swallow whole.   Yes [provider]  Coenzyme Q10 (COQ-10) 100 MG CAPS Take 1 capsule by mouth in the morning and at bedtime. 11/02/21  Yes [provider]  finasteride  (PROSCAR ) 5 MG tablet TAKE 1 TABLET (5 MG TOTAL) BY MOUTH DAILY. 01/09/24 01/08/25 Yes Arellano Zameza, Priscila, MD  ibuprofen  (ADVIL ) 200 MG tablet Take 400 mg by mouth every 6 (six) hours as needed for mild pain (pain score 1-3).   Yes [provider]  metoprolol  succinate (TOPROL -XL) 25 MG 24 hr tablet TAKE 1 TABLET (25 MG) BY MOUTH DAILY. APPOINTMENT NEEDED FOR CONTINUATION OF REFILLS-1ST ATTEMPT Patient taking differently: Take 25 mg by mouth at bedtime. Take 1 tablet (25 mg) by mouth at bedtime. 01/24/24  Yes Turner, Wilbert SAUNDERS, MD  Multiple Vitamin (MULTIVITAMIN WITH MINERALS) TABS tablet Take 1 tablet by mouth daily.   Yes [provider]  N-ACETYL CYSTEINE  600 MG TABS Take 1 tablet by mouth at bedtime. 11/02/21  Yes [provider]  omeprazole  (PRILOSEC) 20 MG capsule TAKE 1 CAPSULE BY MOUTH EVERY DAY 01/27/24  Yes Arellano Zameza, Priscila, MD  OVER THE COUNTER MEDICATION Take 1 capsule by mouth at bedtime. PQQ: Pyrroloquinoline Quinone Take one capsule by mouth daily   Yes [provider]  RA CRANBERRY 500 MG CAPS Take 1 capsule by mouth in the morning and at bedtime. 11/02/21  Yes [provider]  rosuvastatin  (CRESTOR ) 10 MG tablet TAKE 1 TABLET BY MOUTH EVERY DAY Patient  taking differently: Take 10 mg by mouth 4 (four) times a week. Take one tablet (10mg ) by mouth at bedtime. 12/20/23  Yes Arellano Zameza, Priscila, MD  sodium chloride  1 g tablet Take 2 tablets (2 g total) by mouth 3 (three) times daily with meals. 05/12/24  Yes Waymond Cart, MD  tamsulosin  (FLOMAX ) 0.4 MG CAPS capsule TAKE 1 CAPSULE BY MOUTH EVERY DAY Patient taking differently: Take 0.4 mg by mouth at bedtime. 01/24/24  Yes Arellano Zameza, Priscila, MD  albuterol  (VENTOLIN  HFA) 108 (90 Base) MCG/ACT inhaler Inhale 2 puffs into the lungs every 4 (four) hours as needed for wheezing or shortness of breath. Patient not taking: Reported on 05/08/2024 05/06/24   Raford Lenis, MD  meloxicam  (MOBIC ) 15 MG tablet Take 1 tablet (15 mg total) by mouth daily as needed for pain. Patient not taking: Reported on 05/16/2024 04/19/24   Syeda, Raeeha, DO  spironolactone  (ALDACTONE ) 25 MG tablet Take 0.5 tablets (12.5 mg total) by mouth daily. Patient not taking: Reported on 05/16/2024 01/27/24   Shlomo Wilbert SAUNDERS, MD     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  Tobacco Use   Smoking status: Former   Smokeless tobacco: Never   Tobacco comments:    quit 40+ years ago  Vaping Use   Vaping status: Never Used  Substance and Sexual Activity   Alcohol use: Yes    Comment: wine 3 x a week   Drug use: Never   Sexual activity: Yes    Partners: Female  Other Topics Concern   Not on file  Social History Narrative   Not on file    Social Drivers of Health   Financial Resource Strain: Low Risk  (01/19/2023)   Overall Financial Resource Strain (CARDIA)    Difficulty of Paying Living Expenses: Not hard at all  Food Insecurity: No Food Insecurity (05/16/2024)   Hunger Vital Sign    Worried About Running Out of Food in the Last Year: Never true    Ran Out of Food in the Last Year: Never true  Transportation Needs: No Transportation Needs (05/16/2024)   PRAPARE - Administrator, Civil Service (Medical): No  Lack of Transportation (Non-Medical): No  Physical Activity: Sufficiently Active (01/19/2023)   Exercise Vital Sign    Days of Exercise per Week: 7 days    Minutes of Exercise per Session: 30 min  Stress: No Stress Concern Present (01/19/2023)   [employer] of Occupational Health - Occupational Stress Questionnaire    Feeling of Stress : Not at all  Social Connections: Socially Integrated (05/16/2024)   Social Connection and Isolation Panel    Frequency of Communication with Friends and Family: More than three times a week    Frequency of Social Gatherings with Friends and Family: More than three times a week    Attends Religious Services: More than 4 times per year    Active Member of [employer] or Organizations: Yes    Attends Engineer, structural: More than 4 times per year    Marital Status: Married     Review of Systems: A 12 point ROS discussed and pertinent positives are indicated in the HPI above.  All other systems are negative.  Review of Systems  Constitutional:  Negative for fatigue and fever.  Respiratory:  Negative for cough and shortness of breath.   Cardiovascular:  Negative for chest pain.  Gastrointestinal:  Positive for abdominal pain, blood in stool and vomiting.  Musculoskeletal:  Negative for back pain.  Psychiatric/Behavioral:  Negative for behavioral problems and confusion.     Vital Signs: BP 130/89 (BP Location: Left Arm)   Pulse 71   Temp 98.4 F (36.9  C)   Resp 16   SpO2 100%   Physical Exam Vitals and nursing note reviewed.  Constitutional:      General: He is not in acute distress.    Appearance: He is well-developed. He is not ill-appearing.  Cardiovascular:     Rate and Rhythm: Normal rate and regular rhythm.  Pulmonary:     Effort: Pulmonary effort is normal. No respiratory distress.     Breath sounds: Normal breath sounds.  Abdominal:     Palpations: Abdomen is soft.     Tenderness: There is abdominal tenderness.  Skin:    General: Skin is warm and dry.  Neurological:     General: No focal deficit present.     Mental Status: He is alert and oriented to person, place, and time.  Psychiatric:        Mood and Affect: Mood normal.        Behavior: Behavior normal.      MD Evaluation Airway: WNL Heart: WNL Abdomen: WNL Chest/ Lungs: WNL ASA  Classification: 3 Mallampati/Airway Score: Two   Imaging: CT Angio Chest/Abd/Pel for Dissection W and/or W/WO Result Date: 05/16/2024 CLINICAL DATA:  Provided history: Acute aortic syndrome (AAS) suspected Technologist notes state abdominal pain.  Rectal bleeding. EXAM: CT ANGIOGRAPHY CHEST, ABDOMEN AND PELVIS TECHNIQUE: Non-contrast CT of the chest was initially obtained. Multidetector CT imaging through the chest, abdomen and pelvis was performed using the standard protocol during bolus administration of intravenous contrast. Multiplanar reconstructed images and MIPs were obtained and reviewed to evaluate the vascular anatomy. RADIATION DOSE REDUCTION: This exam was performed according to the departmental dose-optimization program which includes automated exposure control, adjustment of the mA and/or kV according to patient size and/or use of iterative reconstruction technique. CONTRAST:  75mL OMNIPAQUE  IOHEXOL  350 MG/ML SOLN COMPARISON:  Abdominopelvic CT 05/07/2024, chest CT 12/21/2022, abdominal CTA 08/08/2019 FINDINGS: CTA CHEST FINDINGS Cardiovascular: No aortic hematoma on  unenhanced exam. Mildly aneurysmal ascending aorta at 4.1 cm. No  dissection or acute aortic findings. No pulmonary embolus to the segmental level. The heart is borderline enlarged. No pericardial effusion. Mediastinum/Nodes: No mediastinal or hilar adenopathy. The esophagus is decompressed, tiny hiatal hernia. No thyroid  nodule. Lungs/Pleura: Elevated right hemidiaphragm. There is increasing atelectasis at the right lung base from recent abdominopelvic CT. 6-7 mm left lower lobe pulmonary nodule, series 7, image 120, unchanged over serial exams. Additional punctate left lower lobe nodule, series 7, image 94, is also unchanged. Again seen mild biapical pleuroparenchymal scarring. No features of pulmonary edema. No pleural effusion. Trachea and central airways are clear. Musculoskeletal: Right glenohumeral arthropathy. Mild degenerative change in the thoracic spine. There are no acute or suspicious osseous abnormalities. Review of the MIP images confirms the above findings. CTA ABDOMEN AND PELVIS FINDINGS VASCULAR Aorta: Normal caliber aorta without aneurysm, dissection, vasculitis or significant stenosis. Celiac: Separate origin of the hepatic and splenic arteries from the upper abdominal aorta. Questionable soft tissue thickening about the hepatic artery, reference series 9, image 74. An additional tiny artery arising from the abdominal aorta above the hepatic and celiac arteries, may be a small gastric artery, demonstrates 7 mm aneurysm, series 6, image 133. No adjacent stranding. SMA: Patent without evidence of aneurysm, dissection, vasculitis or significant stenosis. Renals: Both renal arteries are patent without evidence of aneurysm, dissection, vasculitis, fibromuscular dysplasia or significant stenosis. IMA: Patent without evidence of aneurysm, dissection, vasculitis or significant stenosis. Inflow: Patent without evidence of aneurysm, dissection, vasculitis or significant stenosis. Veins: No obvious venous  abnormality within the limitations of this arterial phase study. Review of the MIP images confirms the above findings. NON-VASCULAR Hepatobiliary: No focal abnormality on this arterial phase exam. Gallbladder physiologically distended, no calcified stone. No biliary dilatation. Pancreas: Improved peripancreatic edema from prior exam. No definite residual pancreatic inflammation. No pancreatic ductal dilatation. No peripancreatic collection. Spleen: Normal in size and arterial enhancement. Adrenals/Urinary Tract: No adrenal nodule. Again seen extrarenal pelvis configuration of the left kidney. No hydronephrosis. Again seen diffuse bladder wall thickening. Stomach/Bowel: Small hiatal hernia. Colonic diverticulosis without diverticulitis. No evidence of bowel inflammation or obstruction. The appendix is not definitively seen. There is no contrast accumulating in the GI tract to suggest GI bleed. Lymphatic: No enlarged lymph nodes in the abdomen or pelvis. Reproductive: Unremarkable prostate. Other: Diminished free fluid in the pelvis from recent CT. Moderate fat containing left inguinal hernia. Small fat containing umbilical hernia. Musculoskeletal: Mild scoliosis and degenerative change in the spine. Review of the MIP images confirms the above findings. IMPRESSION: 1. No aortic dissection or acute aortic abnormality. Mildly aneurysmal ascending thoracic aorta at 4.1 cm. Recommend annual imaging follow-up by CTA or MRA. 2. No evidence of GI bleed. 3. Questionable soft tissue thickening about the hepatic artery which may be secondary to vasculitis/inflammation. 4. 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 Consider IR consultation. 5. Improved peripancreatic edema from prior exam. No definite residual pancreatic inflammation. 6. Colonic diverticulosis without diverticulitis. 7. Diffuse bladder wall thickening, also present on prior. This may be due to  chronic bladder outlet obstruction or cystitis. 8. Left lower lobe pulmonary nodules measuring up to 6-7 mm, unchanged over serial exams. No specific imaging follow-up is needed. 9. Small hiatal hernia. 10. Fat containing left inguinal and umbilical hernias. Electronically Signed   By: Andrea Gasman M.D.   On: 05/16/2024 17:03   DG Chest Portable 1 View Result Date: 05/16/2024 CLINICAL DATA:  Acute epigastric  abdominal pain. EXAM: PORTABLE CHEST 1 VIEW COMPARISON:  May 07, 2024. FINDINGS: The heart size and mediastinal contours are within normal limits. Minimal bibasilar subsegmental atelectasis is noted. The visualized skeletal structures are unremarkable. IMPRESSION: Minimal bibasilar subsegmental atelectasis. Electronically Signed   By: Lynwood Landy Raddle M.D.   On: 05/16/2024 16:04   CT ABDOMEN PELVIS W CONTRAST Result Date: 05/07/2024 CLINICAL DATA:  Epigastric pain EXAM: CT ABDOMEN AND PELVIS WITH CONTRAST TECHNIQUE: Multidetector CT imaging of the abdomen and pelvis was performed using the standard protocol following bolus administration of intravenous contrast. RADIATION DOSE REDUCTION: This exam was performed according to the departmental dose-optimization program which includes automated exposure control, adjustment of the mA and/or kV according to patient size and/or use of iterative reconstruction technique. CONTRAST:  75mL OMNIPAQUE  IOHEXOL  350 MG/ML SOLN COMPARISON:  12/21/2022, 08/08/2019 FINDINGS: Lower chest: Elevated right hemidiaphragm, with compressive atelectasis at the right lung base. No acute pleural or parenchymal lung disease. Hepatobiliary: No focal liver abnormality is seen. No gallstones, gallbladder wall thickening, or biliary dilatation. Pancreas: There is mild peripancreatic fat stranding along the dorsal margin of the pancreatic body, reference image 27/3, consistent with mild acute uncomplicated pancreatitis. No pancreatic duct dilation. No fluid collection or pseudocyst.  Spleen: Normal in size without focal abnormality. Adrenals/Urinary Tract: Bilateral subcentimeter renal cortical cysts do not require specific imaging follow-up. Otherwise the kidneys enhance normally. Extrarenal pelvis is again noted within the left kidney. No urinary tract calculi or obstructive uropathy. There is mild diffuse bladder wall thickening and trabeculations, consistent with chronic bladder outlet obstruction. No filling defects. The adrenals are unremarkable. Stomach/Bowel: No bowel obstruction or ileus. The appendix, if still present, is not well visualized. Diffuse colonic diverticulosis without diverticulitis. Small hiatal hernia. No bowel wall thickening or inflammatory change. Vascular/Lymphatic: No significant vascular findings are present. No enlarged abdominal or pelvic lymph nodes. Reproductive: Prostate is unremarkable. Other: Trace pelvic free fluid. No free intraperitoneal gas. Small fat containing umbilical hernia and moderate fat containing left inguinal hernia. No bowel herniation. Musculoskeletal: No acute or destructive bony abnormalities. Reconstructed images demonstrate no additional findings. IMPRESSION: 1. Mild peripancreatic fat stranding consistent with acute uncomplicated pancreatitis. No fluid collection, pseudocyst, or abscess. 2. Trace pelvic free fluid, likely reactive. 3. Diffuse colonic diverticulosis without diverticulitis. 4. Mild diffuse bladder wall thickening and trabeculations, most consistent with sequela of chronic bladder outlet obstruction. Electronically Signed   By: Ozell Daring M.D.   On: 05/07/2024 16:11   DG Chest 2 View Result Date: 05/07/2024 EXAM: 2 VIEW(S) XRAY OF THE CHEST 05/07/2024 02:09:00 PM COMPARISON: 05/05/2024 CLINICAL HISTORY: Chest pain. Per triage: Pt c.o epigastric pain since Friday with some n/v. Denies diarrhea or SOB. FINDINGS: LUNGS AND PLEURA: Bibasilar atelectasis. No pulmonary edema. No pleural effusion. No pneumothorax. HEART AND  MEDIASTINUM: Mild cardiomegaly. No acute abnormality of the cardiac and mediastinal silhouettes. No congestive heart failure. BONES AND SOFT TISSUES: Multilevel thoracic osteophytosis. No acute osseous abnormality. VASCULATURE: Tortuous aorta. LIMITATIONS/ARTIFACTS: Lateral view degraded by patient arm position, not raised above the head. IMPRESSION: 1. No acute findings. 2. Mild cardiomegaly without congestive heart failure. Electronically signed by: Rockey Kilts MD 05/07/2024 02:40 PM EDT RP Workstation: [id number]   DG Chest 2 View Result Date: 05/06/2024 CLINICAL DATA:  chest pressure EXAM: CHEST - 2 VIEW COMPARISON:  Chest x-ray February 08, 2018. FINDINGS: The heart size and mediastinal contours are within normal limits. Both lungs are clear. No visible pleural effusions or pneumothorax. Remote left eighth rib fracture.  No evidence of acute fracture. IMPRESSION: No active cardiopulmonary disease. Electronically Signed   By: Gilmore GORMAN Molt M.D.   On: 05/06/2024 00:06    Labs:  CBC: Recent Labs    05/10/24 0257 05/11/24 0404 05/16/24 0739 05/16/24 0745 05/17/24 0158  WBC 6.3 6.4 9.5  --  8.9  HGB 13.6 12.5* 11.5* 11.2* 9.7*  HCT 37.4* 35.0* 33.1* 33.0* 27.7*  PLT 161 174 308  --  249    COAGS: No results for input(s): INR, APTT in the last 8760 hours.  BMP: Recent Labs    05/11/24 1925 05/12/24 0153 05/16/24 0739 05/16/24 0745 05/17/24 0158  NA 128* 130* 129* 130* 127*  K 3.9 3.7 4.6 4.6 4.4  CL 92* 96* 96* 95* 95*  CO2 24 24 25   --  22  GLUCOSE 123* 92 130* 132* 96  BUN 15 12 17 18 15   CALCIUM  8.1* 8.0* 8.2*  --  8.0*  CREATININE 1.01 0.98 0.94 0.90 0.78  GFRNONAA >60 >60 >60  --  >60    LIVER FUNCTION TESTS: Recent Labs    01/17/24 0847 05/07/24 1346 05/16/24 0739  BILITOT  --  1.1 1.0  AST  --  55* 27  ALT 25 63* 55*  ALKPHOS  --  46 69  PROT  --  6.5 5.7*  ALBUMIN  --  3.7 3.0*    TUMOR MARKERS: No results for input(s): AFPTM, CEA, CA199,  CHROMGRNA in the last 8760 hours.  Assessment and Plan: Gastric artery aneurysm Patient with newly found possible gastric artery aneurysm in the setting of recent pancreatitis and additional imaging findings concerning for vasculitis.  IR consulted for angiogram with intervention.  Patient undergoing acute work-up for GI bleed with GI team.   Tentative plan made for angiogram with intervention tomorrow with Dr. Jenna secondary to the interventions needed on behalf of the GI team.   Discussed with patient and family at bedside. They are understanding of the goals of the procedure, potential challenges, as well as plans as schedule allows.  They are agreeable to proceed.   Risks and benefits of angiogram were discussed with the patient including, but not limited to bleeding, infection, vascular injury or contrast induced renal failure.  This interventional procedure involves the use of X-rays and because of the nature of the planned procedure, it is possible that we will have prolonged use of X-ray fluoroscopy.  Potential radiation risks to you include (but are not limited to) the following: - A slightly elevated risk for cancer  several years later in life. This risk is typically less than 0.5% percent. This risk is low in comparison to the normal incidence of human cancer, which is 33% for women and 50% for men according to the [employer] Society. - Radiation induced injury can include skin redness, resembling a rash, tissue breakdown / ulcers and hair loss (which can be temporary or permanent).   The likelihood of either of these occurring depends on the difficulty of the procedure and whether you are sensitive to radiation due to previous procedures, disease, or genetic conditions.   IF your procedure requires a prolonged use of radiation, you will be notified and given written instructions for further action.  It is your responsibility to monitor the irradiated area for the 2  weeks following the procedure and to notify your physician if you are concerned that you have suffered a radiation induced injury.    All of the patient's questions were answered, patient is  agreeable to proceed.  Consent signed and in chart.   Thank you for this interesting consult.  I greatly enjoyed meeting TYSHON FANNING and look forward to participating in their care.  A copy of this report was sent to the requesting provider on this date.  Electronically Signed: Melvyn Hommes Sue-Ellen Riyanshi Wahab, PA 05/17/2024, 4:29 PM   I spent a total of 40 Minutes    in face to face in clinical consultation, greater than 50% of which was counseling/coordinating care for GI bleed.

## 2024-05-17 NOTE — Consult Note (Addendum)
 Consultation Note   Referring Provider:   Teaching Service  PCP: Edgardo Pontiff, DO Primary Gastroenterologist:    Aloha Finner, MD     Reason for Consultation: Gastrointestinal bleeding DOA: 05/16/2024         Hospital Day: 2   ASSESSMENT    73 year old male admitted with upper abdominal pain / gastrointestinal bleeding ( maroon stools).  Upper abdominal pain may be related to recent pancreatitis but with the gastrointestinal bleeding need to consider that he has developed PUD in the setting of meloxicam  and ibuprofen .  CT angio negative for extravasation of blood. He is hemodynamically stable and interestingly BUN is normal .  Small bowel bleed  / lower GI bleed on the list of possibilities .  No active bleeding /or any BMs since 6 AM yesterday.   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  Presenting hemoglobin this admission with GI bleed was 11.5, currently down to 9.7  Recent episode of pancreatitis Radiologic findings of mild, acute uncomplicated pancreatitis on CTAP with contrast 05/07/2024 with normal lipase.  Etiology?  EtOH unlikely factor .  Did not seem biliary related .  Improving on CT this admission .   History of colon polyps Small tubular adenoma March 2022.  7-year follow-up colonoscopy was recommended  Chronic bladder outlet obstruction /cystitis   Chronic GERD  On daily PPI at home  Nonischemic cardiomyopathy EF 45 to 50% per echo in January 2025.   CVA  Hypertension   Principal Problem:   Gastric artery aneurysm (HCC) Active Problems:   Benign essential HTN   Ascending aorta dilation (HCC)   Nonischemic cardiomyopathy (HCC)   GERD (gastroesophageal reflux disease)   BPH (benign prostatic hyperplasia)    Hyponatremia   Abdominal pain, epigastric   Hematochezia   Normocytic anemia   PLAN:   --Continue twice daily IV PPI -- Trend H&H, should stabilize now in absence of active bleeding.  -- Will plan for EGD tomorrow.  The risks and benefits of EGD with possible biopsies were discussed with the patient who agrees to proceed.  -- Clear liquids okay today.  N.p.o. after midnight -- Following this hospitalization he will need to follow-up in our office regarding recent episode of pancreatitis to try determine etiology.    HPI   Brief history  Edward [name] was just discharged from the hospital 05/12/2024 after being hospitalized with severe hyponatremia,  pancreatitis and a UTI  Interval history Patient returned to the ED yesterday with ongoing abdominal pain also complained of blood in stool.  After hospital discharge patient's abdominal pain had resolved with meloxicam  .   Then he began having the same mid upper abdominal pain a few days ago so he started taking Tylenol  and ibuprofen  .  Yesterday morning around 6:30 AM he had 3 bowel movements.  Stools were maroon in color.  No associated nausea or vomiting.  No chest pain or shortness of breath.   February 2022 screening colonoscopy-Dr. Mansouraty - Hemorrhoids found on digital rectal exam. - Stool in the entire examined colon - lavaged copiously with adequate visualization. -  One 4 mm polyp in the rectum, removed with a cold snare. Resected and retrieved. - Diverticulosis in the entire examined colon. - Normal mucosa in the entire examined colon otherwise. - Non-bleeding non-thrombosed internal hemorrhoids. Path: Tubular adenoma  Labs and Imaging:  Recent Labs    05/16/24 0739  PROT 5.7*  ALBUMIN 3.0*  AST 27  ALT 55*  ALKPHOS 69  BILITOT 1.0   Recent Labs    05/16/24 0739 05/16/24 0745 05/17/24 0158  WBC 9.5  --  8.9  HGB 11.5* 11.2* 9.7*  HCT 33.1* 33.0* 27.7*  MCV 90.2  --  88.2  PLT 308  --  249   Recent Labs     05/16/24 0739 05/16/24 0745 05/17/24 0158  NA 129* 130* 127*  K 4.6 4.6 4.4  CL 96* 95* 95*  CO2 25  --  22  GLUCOSE 130* 132* 96  BUN 17 18 15   CREATININE 0.94 0.90 0.78  CALCIUM  8.2*  --  8.0*     CT Angio Chest/Abd/Pel for Dissection W and/or W/WO CLINICAL DATA:  Provided history: Acute aortic syndrome (AAS) suspected  Technologist notes state abdominal pain.  Rectal bleeding.  EXAM: CT ANGIOGRAPHY CHEST, ABDOMEN AND PELVIS  TECHNIQUE: Non-contrast CT of the chest was initially obtained. Multidetector CT imaging through the chest, abdomen and pelvis was performed using the standard protocol during bolus administration of intravenous contrast. Multiplanar reconstructed images and MIPs were obtained and reviewed to evaluate the vascular anatomy.  RADIATION DOSE REDUCTION: This exam was performed according to the departmental dose-optimization program which includes automated exposure control, adjustment of the mA and/or kV according to patient size and/or use of iterative reconstruction technique.  CONTRAST:  75mL OMNIPAQUE  IOHEXOL  350 MG/ML SOLN  COMPARISON:  Abdominopelvic CT 05/07/2024, chest CT 12/21/2022, abdominal CTA 08/08/2019  FINDINGS: CTA CHEST FINDINGS  Cardiovascular: No aortic hematoma on unenhanced exam. Mildly aneurysmal ascending aorta at 4.1 cm. No dissection or acute aortic findings. No pulmonary embolus to the segmental level. The heart is borderline enlarged. No pericardial effusion.  Mediastinum/Nodes: No mediastinal or hilar adenopathy. The esophagus is decompressed, tiny hiatal hernia. No thyroid  nodule.  Lungs/Pleura: Elevated right hemidiaphragm. There is increasing atelectasis at the right lung base from recent abdominopelvic CT. 6-7 mm left lower lobe pulmonary nodule, series 7, image 120, unchanged over serial exams. Additional punctate left lower lobe nodule, series 7, image 94, is also unchanged. Again seen mild biapical  pleuroparenchymal scarring. No features of pulmonary edema. No pleural effusion. Trachea and central airways are clear.  Musculoskeletal: Right glenohumeral arthropathy. Mild degenerative change in the thoracic spine. There are no acute or suspicious osseous abnormalities.  Review of the MIP images confirms the above findings.  CTA ABDOMEN AND PELVIS FINDINGS  VASCULAR  Aorta: Normal caliber aorta without aneurysm, dissection, vasculitis or significant stenosis.  Celiac: Separate origin of the hepatic and splenic arteries from the upper abdominal aorta. Questionable soft tissue thickening about the hepatic artery, reference series 9, image 74. An additional tiny artery arising from the abdominal aorta above the hepatic and celiac arteries, may be a small gastric artery, demonstrates 7 mm aneurysm, series 6, image 133. No adjacent stranding.  SMA: Patent without evidence of aneurysm, dissection, vasculitis or significant stenosis.  Renals: Both renal arteries are patent without evidence of aneurysm, dissection, vasculitis, fibromuscular dysplasia or significant stenosis.  IMA: Patent without evidence of aneurysm, dissection, vasculitis or significant stenosis.  Inflow: Patent without evidence of aneurysm, dissection, vasculitis or significant stenosis.  Veins: No obvious venous abnormality within the limitations of this arterial phase study.  Review of the MIP images confirms the above findings.  NON-VASCULAR  Hepatobiliary: No focal abnormality on this arterial phase exam. Gallbladder physiologically distended, no calcified stone. No biliary dilatation.  Pancreas: Improved peripancreatic edema from prior exam. No definite residual pancreatic inflammation. No pancreatic ductal dilatation. No peripancreatic collection.  Spleen: Normal in size and arterial enhancement.  Adrenals/Urinary Tract: No adrenal nodule. Again seen extrarenal pelvis configuration of the left  kidney. No hydronephrosis. Again seen diffuse bladder wall thickening.  Stomach/Bowel: Small hiatal hernia. Colonic diverticulosis without diverticulitis. No evidence of bowel inflammation or obstruction. The appendix is not definitively seen. There is no contrast accumulating in the GI tract to suggest GI bleed.  Lymphatic: No enlarged lymph nodes in the abdomen or pelvis.  Reproductive: Unremarkable prostate.  Other: Diminished free fluid in the pelvis from recent CT. Moderate fat containing left inguinal hernia. Small fat containing umbilical hernia.  Musculoskeletal: Mild scoliosis and degenerative change in the spine.  Review of the MIP images confirms the above findings.  IMPRESSION: 1. No aortic dissection or acute aortic abnormality. Mildly aneurysmal ascending thoracic aorta at 4.1 cm. Recommend annual imaging follow-up by CTA or MRA. 2. No evidence of GI bleed. 3. Questionable soft tissue thickening about the hepatic artery which may be secondary to vasculitis/inflammation. 4. 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 Consider IR consultation. 5. Improved peripancreatic edema from prior exam. No definite residual pancreatic inflammation. 6. Colonic diverticulosis without diverticulitis. 7. Diffuse bladder wall thickening, also present on prior. This may be due to chronic bladder outlet obstruction or cystitis. 8. Left lower lobe pulmonary nodules measuring up to 6-7 mm, unchanged over serial exams. No specific imaging follow-up is needed. 9. Small hiatal hernia. 10. Fat containing left inguinal and umbilical hernias.  Electronically Signed   By: Andrea Gasman M.D.   On: 05/16/2024 17:03 DG Chest Portable 1 View CLINICAL DATA:  Acute epigastric abdominal pain.  EXAM: PORTABLE CHEST 1 VIEW  COMPARISON:  May 07, 2024.  FINDINGS: The heart size and mediastinal contours are within  normal limits. Minimal bibasilar subsegmental atelectasis is noted. The visualized skeletal structures are unremarkable.  IMPRESSION: Minimal bibasilar subsegmental atelectasis.  Electronically Signed   By: Lynwood Landy Raddle M.D.   On: 05/16/2024 16:04    Past Medical History:  Diagnosis Date   Abnormal EKG 05/29/2018   Sinus bradycardia and LAFB with TWI V4-V6, III and aVF   Benign essential HTN 05/29/2018   Cardiomyopathy (HCC)    a. dx by echo 07/2020.   Dilation of aorta (HCC)    Environmental allergies    Fracture, ribs    H/O: varicose veins    Hearing loss    Hyperlipidemia LDL goal <70    Multiple food allergies    Rash    Seasonal allergies    Sinusitis    Stroke [facility])    s/p tpa    Past Surgical History:  Procedure Laterality Date   COLONOSCOPY     INGUINAL HERNIA REPAIR Left    right hand surgery      Family History  Problem Relation Age of Onset   CVA Mother    Heart failure Father        CHF   Brain cancer Brother        GBM   Colon polyps Neg Hx  Colon cancer Neg Hx    Esophageal cancer Neg Hx    Rectal cancer Neg Hx    Stomach cancer Neg Hx     Prior to Admission medications   Medication Sig Start Date End Date Taking? Authorizing Provider  acetaminophen  (TYLENOL ) 650 MG CR tablet Take 1,300 mg by mouth every 8 (eight) hours as needed for pain.   Yes [provider]  aspirin  EC 81 MG tablet Take 81 mg by mouth at bedtime. Swallow whole.   Yes [provider]  Coenzyme Q10 (COQ-10) 100 MG CAPS Take 1 capsule by mouth in the morning and at bedtime. 11/02/21  Yes [provider]  finasteride  (PROSCAR ) 5 MG tablet TAKE 1 TABLET (5 MG TOTAL) BY MOUTH DAILY. 01/09/24 01/08/25 Yes Arellano Zameza, Priscila, MD  ibuprofen  (ADVIL ) 200 MG tablet Take 400 mg by mouth every 6 (six) hours as needed for mild pain (pain score 1-3).   Yes [provider]  metoprolol  succinate (TOPROL -XL) 25 MG 24 hr tablet TAKE 1 TABLET (25  MG) BY MOUTH DAILY. APPOINTMENT NEEDED FOR CONTINUATION OF REFILLS-1ST ATTEMPT Patient taking differently: Take 25 mg by mouth at bedtime. Take 1 tablet (25 mg) by mouth at bedtime. 01/24/24  Yes Turner, Wilbert SAUNDERS, MD  Multiple Vitamin (MULTIVITAMIN WITH MINERALS) TABS tablet Take 1 tablet by mouth daily.   Yes [provider]  N-ACETYL CYSTEINE 600 MG TABS Take 1 tablet by mouth at bedtime. 11/02/21  Yes [provider]  omeprazole  (PRILOSEC) 20 MG capsule TAKE 1 CAPSULE BY MOUTH EVERY DAY 01/27/24  Yes Arellano Zameza, Priscila, MD  OVER THE COUNTER MEDICATION Take 1 capsule by mouth at bedtime. PQQ: Pyrroloquinoline Quinone Take one capsule by mouth daily   Yes [provider]  RA CRANBERRY 500 MG CAPS Take 1 capsule by mouth in the morning and at bedtime. 11/02/21  Yes [provider]  rosuvastatin  (CRESTOR ) 10 MG tablet TAKE 1 TABLET BY MOUTH EVERY DAY Patient taking differently: Take 10 mg by mouth 4 (four) times a week. Take one tablet (10mg ) by mouth at bedtime. 12/20/23  Yes Arellano Zameza, Priscila, MD  sodium chloride  1 g tablet Take 2 tablets (2 g total) by mouth 3 (three) times daily with meals. 05/12/24  Yes Waymond Cart, MD  tamsulosin  (FLOMAX ) 0.4 MG CAPS capsule TAKE 1 CAPSULE BY MOUTH EVERY DAY Patient taking differently: Take 0.4 mg by mouth at bedtime. 01/24/24  Yes Arellano Zameza, Priscila, MD  albuterol  (VENTOLIN  HFA) 108 (90 Base) MCG/ACT inhaler Inhale 2 puffs into the lungs every 4 (four) hours as needed for wheezing or shortness of breath. Patient not taking: Reported on 05/08/2024 05/06/24   Raford Lenis, MD  meloxicam  (MOBIC ) 15 MG tablet Take 1 tablet (15 mg total) by mouth daily as needed for pain. Patient not taking: Reported on 05/16/2024 04/19/24   Syeda, Raeeha, DO  spironolactone  (ALDACTONE ) 25 MG tablet Take 0.5 tablets (12.5 mg total) by mouth daily. Patient not taking: Reported on 05/16/2024 01/27/24   Shlomo Wilbert SAUNDERS, MD    Current  Facility-Administered Medications  Medication Dose Route Frequency Provider Last Rate Last Admin   acetaminophen  (TYLENOL ) tablet 1,000 mg  1,000 mg Oral Q6H PRN Norrine Sharper, MD       pantoprazole  (PROTONIX ) injection 40 mg  40 mg Intravenous Q12H McLendon, Michael, MD   40 mg at 05/17/24 0855    Allergies as of 05/16/2024   (No Known Allergies)    Social History  Socioeconomic History   Marital status: Married    Spouse name: Not on file   Number of children: Not on file   Years of education: Not on file   Highest education level: Not on file  Occupational History   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  Tobacco Use   Smoking status: Former   Smokeless tobacco: Never   Tobacco comments:    quit 40+ years ago  Vaping Use   Vaping status: Never Used  Substance and Sexual Activity   Alcohol use: Yes    Comment: wine 3 x a week   Drug use: Never   Sexual activity: Yes    Partners: Female  Other Topics Concern   Not on file  Social History Narrative   Not on file   Social Drivers of Health   Financial Resource Strain: Low Risk  (01/19/2023)   Overall Financial Resource Strain (CARDIA)    Difficulty of Paying Living Expenses: Not hard at all  Food Insecurity: No Food Insecurity (05/16/2024)   Hunger Vital Sign    Worried About Running Out of Food in the Last Year: Never true    Ran Out of Food in the Last Year: Never true  Transportation Needs: No Transportation Needs (05/16/2024)   PRAPARE - Administrator, Civil Service (Medical): No    Lack of Transportation (Non-Medical): No  Physical Activity: Sufficiently Active (01/19/2023)   Exercise Vital Sign    Days of Exercise per Week: 7 days    Minutes of Exercise per Session: 30 min  Stress: No Stress Concern Present (01/19/2023)   [employer] of Occupational Health - Occupational Stress Questionnaire    Feeling of Stress : Not at all  Social Connections: Socially Integrated (05/16/2024)    Social Connection and Isolation Panel    Frequency of Communication with Friends and Family: More than three times a week    Frequency of Social Gatherings with Friends and Family: More than three times a week    Attends Religious Services: More than 4 times per year    Active Member of [employer] or Organizations: Yes    Attends Engineer, structural: More than 4 times per year    Marital Status: Married  Catering manager Violence: Not At Risk (05/16/2024)   Humiliation, Afraid, Rape, and Kick questionnaire    Fear of Current or Ex-Partner: No    Emotionally Abused: No    Physically Abused: No    Sexually Abused: No     Code Status   Code Status: Full Code  Review of Systems: All systems reviewed and negative except where noted in HPI.  Physical Exam: Vital signs in last 24 hours: Temp:  [97.4 F (36.3 C)-98.5 F (36.9 C)] 98.4 F (36.9 C) (09/18 1153) Pulse Rate:  [70-159] 71 (09/18 1153) Resp:  [15-18] 16 (09/18 1153) BP: (112-201)/(81-181) 130/89 (09/18 1153) SpO2:  [90 %-100 %] 100 % (09/18 1153)    General:  Pleasant male in NAD Psych:  Cooperative. Normal mood and affect Eyes: Pupils equal Ears:  Normal auditory acuity Nose: No deformity, discharge or lesions Neck:  Supple, no masses felt Lungs:  Clear to auscultation.  Heart:  Regular rate, regular rhythm.  Abdomen:  Soft, nondistended, nontender, active bowel sounds, no masses felt Rectal :  Deferred Msk: Symmetrical without gross deformities.  Neurologic:  Alert, oriented, grossly normal neurologically Extremities : No edema Skin:  Intact without significant lesions.    Intake/Output from previous  day: No intake/output data recorded. Intake/Output this shift:  No intake/output data recorded.   Vina Dasen, NP-C   05/17/2024, 12:01 PM  ---------------------------------------------------------------------  I have taken a history, reviewed the chart and examined the patient. I performed a  substantive portion of this encounter, including complete performance of at least one of the key components, in conjunction with the APP. I agree with the APP's note, impression and recommendations  73 year old male with recent admission for hyponatremia and questionable pancreatitis, who experienced large-volume maroon-colored stools yesterday in the setting of NSAID use.  CTA negative.  He continues to have upper abdominal discomfort. CTA incidentally noted gastric artery aneurysm and has been evaluated by IR with plans for angiography at some point.  Patient has not had any other episodes of bleeding since yesterday morning.  He remains hemodynamically stable.  Differential includes peptic ulcer disease given upper abdominal pain and recent NSAID use, but right sided diverticular bleeding also possible (he was then associated with abdominal pain).  Plan for upper endoscopy tomorrow  The details, risks (including bleeding, perforation, infection, missed lesions, medication reactions and possible hospitalization or surgery if complications occur), benefits, and alternatives to EGD with possible biopsy and possible dilation were discussed with the patient and he consents to proceed.    Sena Clouatre E. Stacia, MD [facility]

## 2024-05-17 NOTE — Plan of Care (Signed)

## 2024-05-17 NOTE — H&P (View-Only) (Signed)
 Consultation Note   Referring Provider:   Teaching Service  PCP: Edgardo Pontiff, DO Primary Gastroenterologist:    Aloha Finner, MD     Reason for Consultation: Gastrointestinal bleeding DOA: 05/16/2024         Hospital Day: 2   ASSESSMENT    73 year old male admitted with upper abdominal pain / gastrointestinal bleeding ( maroon stools).  Upper abdominal pain may be related to recent pancreatitis but with the gastrointestinal bleeding need to consider that he has developed PUD in the setting of meloxicam  and ibuprofen .  CT angio negative for extravasation of blood. He is hemodynamically stable and interestingly BUN is normal .  Small bowel bleed  / lower GI bleed on the list of possibilities .  No active bleeding /or any BMs since 6 AM yesterday.   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  Presenting hemoglobin this admission with GI bleed was 11.5, currently down to 9.7  Recent episode of pancreatitis Radiologic findings of mild, acute uncomplicated pancreatitis on CTAP with contrast 05/07/2024 with normal lipase.  Etiology?  EtOH unlikely factor .  Did not seem biliary related .  Improving on CT this admission .   History of colon polyps Small tubular adenoma March 2022.  7-year follow-up colonoscopy was recommended  Chronic bladder outlet obstruction /cystitis   Chronic GERD  On daily PPI at home  Nonischemic cardiomyopathy EF 45 to 50% per echo in January 2025.   CVA  Hypertension   Principal Problem:   Gastric artery aneurysm (HCC) Active Problems:   Benign essential HTN   Ascending aorta dilation (HCC)   Nonischemic cardiomyopathy (HCC)   GERD (gastroesophageal reflux disease)   BPH (benign prostatic hyperplasia)    Hyponatremia   Abdominal pain, epigastric   Hematochezia   Normocytic anemia   PLAN:   --Continue twice daily IV PPI -- Trend H&H, should stabilize now in absence of active bleeding.  -- Will plan for EGD tomorrow.  The risks and benefits of EGD with possible biopsies were discussed with the patient who agrees to proceed.  -- Clear liquids okay today.  N.p.o. after midnight -- Following this hospitalization he will need to follow-up in our office regarding recent episode of pancreatitis to try determine etiology.    HPI   Brief history  Edward [name] was just discharged from the hospital 05/12/2024 after being hospitalized with severe hyponatremia,  pancreatitis and a UTI  Interval history Patient returned to the ED yesterday with ongoing abdominal pain also complained of blood in stool.  After hospital discharge patient's abdominal pain had resolved with meloxicam  .   Then he began having the same mid upper abdominal pain a few days ago so he started taking Tylenol  and ibuprofen  .  Yesterday morning around 6:30 AM he had 3 bowel movements.  Stools were maroon in color.  No associated nausea or vomiting.  No chest pain or shortness of breath.   February 2022 screening colonoscopy-Dr. Mansouraty - Hemorrhoids found on digital rectal exam. - Stool in the entire examined colon - lavaged copiously with adequate visualization. -  One 4 mm polyp in the rectum, removed with a cold snare. Resected and retrieved. - Diverticulosis in the entire examined colon. - Normal mucosa in the entire examined colon otherwise. - Non-bleeding non-thrombosed internal hemorrhoids. Path: Tubular adenoma  Labs and Imaging:  Recent Labs    05/16/24 0739  PROT 5.7*  ALBUMIN 3.0*  AST 27  ALT 55*  ALKPHOS 69  BILITOT 1.0   Recent Labs    05/16/24 0739 05/16/24 0745 05/17/24 0158  WBC 9.5  --  8.9  HGB 11.5* 11.2* 9.7*  HCT 33.1* 33.0* 27.7*  MCV 90.2  --  88.2  PLT 308  --  249   Recent Labs     05/16/24 0739 05/16/24 0745 05/17/24 0158  NA 129* 130* 127*  K 4.6 4.6 4.4  CL 96* 95* 95*  CO2 25  --  22  GLUCOSE 130* 132* 96  BUN 17 18 15   CREATININE 0.94 0.90 0.78  CALCIUM  8.2*  --  8.0*     CT Angio Chest/Abd/Pel for Dissection W and/or W/WO CLINICAL DATA:  Provided history: Acute aortic syndrome (AAS) suspected  Technologist notes state abdominal pain.  Rectal bleeding.  EXAM: CT ANGIOGRAPHY CHEST, ABDOMEN AND PELVIS  TECHNIQUE: Non-contrast CT of the chest was initially obtained. Multidetector CT imaging through the chest, abdomen and pelvis was performed using the standard protocol during bolus administration of intravenous contrast. Multiplanar reconstructed images and MIPs were obtained and reviewed to evaluate the vascular anatomy.  RADIATION DOSE REDUCTION: This exam was performed according to the departmental dose-optimization program which includes automated exposure control, adjustment of the mA and/or kV according to patient size and/or use of iterative reconstruction technique.  CONTRAST:  75mL OMNIPAQUE  IOHEXOL  350 MG/ML SOLN  COMPARISON:  Abdominopelvic CT 05/07/2024, chest CT 12/21/2022, abdominal CTA 08/08/2019  FINDINGS: CTA CHEST FINDINGS  Cardiovascular: No aortic hematoma on unenhanced exam. Mildly aneurysmal ascending aorta at 4.1 cm. No dissection or acute aortic findings. No pulmonary embolus to the segmental level. The heart is borderline enlarged. No pericardial effusion.  Mediastinum/Nodes: No mediastinal or hilar adenopathy. The esophagus is decompressed, tiny hiatal hernia. No thyroid  nodule.  Lungs/Pleura: Elevated right hemidiaphragm. There is increasing atelectasis at the right lung base from recent abdominopelvic CT. 6-7 mm left lower lobe pulmonary nodule, series 7, image 120, unchanged over serial exams. Additional punctate left lower lobe nodule, series 7, image 94, is also unchanged. Again seen mild biapical  pleuroparenchymal scarring. No features of pulmonary edema. No pleural effusion. Trachea and central airways are clear.  Musculoskeletal: Right glenohumeral arthropathy. Mild degenerative change in the thoracic spine. There are no acute or suspicious osseous abnormalities.  Review of the MIP images confirms the above findings.  CTA ABDOMEN AND PELVIS FINDINGS  VASCULAR  Aorta: Normal caliber aorta without aneurysm, dissection, vasculitis or significant stenosis.  Celiac: Separate origin of the hepatic and splenic arteries from the upper abdominal aorta. Questionable soft tissue thickening about the hepatic artery, reference series 9, image 74. An additional tiny artery arising from the abdominal aorta above the hepatic and celiac arteries, may be a small gastric artery, demonstrates 7 mm aneurysm, series 6, image 133. No adjacent stranding.  SMA: Patent without evidence of aneurysm, dissection, vasculitis or significant stenosis.  Renals: Both renal arteries are patent without evidence of aneurysm, dissection, vasculitis, fibromuscular dysplasia or significant stenosis.  IMA: Patent without evidence of aneurysm, dissection, vasculitis or significant stenosis.  Inflow: Patent without evidence of aneurysm, dissection, vasculitis or significant stenosis.  Veins: No obvious venous abnormality within the limitations of this arterial phase study.  Review of the MIP images confirms the above findings.  NON-VASCULAR  Hepatobiliary: No focal abnormality on this arterial phase exam. Gallbladder physiologically distended, no calcified stone. No biliary dilatation.  Pancreas: Improved peripancreatic edema from prior exam. No definite residual pancreatic inflammation. No pancreatic ductal dilatation. No peripancreatic collection.  Spleen: Normal in size and arterial enhancement.  Adrenals/Urinary Tract: No adrenal nodule. Again seen extrarenal pelvis configuration of the left  kidney. No hydronephrosis. Again seen diffuse bladder wall thickening.  Stomach/Bowel: Small hiatal hernia. Colonic diverticulosis without diverticulitis. No evidence of bowel inflammation or obstruction. The appendix is not definitively seen. There is no contrast accumulating in the GI tract to suggest GI bleed.  Lymphatic: No enlarged lymph nodes in the abdomen or pelvis.  Reproductive: Unremarkable prostate.  Other: Diminished free fluid in the pelvis from recent CT. Moderate fat containing left inguinal hernia. Small fat containing umbilical hernia.  Musculoskeletal: Mild scoliosis and degenerative change in the spine.  Review of the MIP images confirms the above findings.  IMPRESSION: 1. No aortic dissection or acute aortic abnormality. Mildly aneurysmal ascending thoracic aorta at 4.1 cm. Recommend annual imaging follow-up by CTA or MRA. 2. No evidence of GI bleed. 3. Questionable soft tissue thickening about the hepatic artery which may be secondary to vasculitis/inflammation. 4. 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 Consider IR consultation. 5. Improved peripancreatic edema from prior exam. No definite residual pancreatic inflammation. 6. Colonic diverticulosis without diverticulitis. 7. Diffuse bladder wall thickening, also present on prior. This may be due to chronic bladder outlet obstruction or cystitis. 8. Left lower lobe pulmonary nodules measuring up to 6-7 mm, unchanged over serial exams. No specific imaging follow-up is needed. 9. Small hiatal hernia. 10. Fat containing left inguinal and umbilical hernias.  Electronically Signed   By: Andrea Gasman M.D.   On: 05/16/2024 17:03 DG Chest Portable 1 View CLINICAL DATA:  Acute epigastric abdominal pain.  EXAM: PORTABLE CHEST 1 VIEW  COMPARISON:  May 07, 2024.  FINDINGS: The heart size and mediastinal contours are within  normal limits. Minimal bibasilar subsegmental atelectasis is noted. The visualized skeletal structures are unremarkable.  IMPRESSION: Minimal bibasilar subsegmental atelectasis.  Electronically Signed   By: Lynwood Landy Raddle M.D.   On: 05/16/2024 16:04    Past Medical History:  Diagnosis Date   Abnormal EKG 05/29/2018   Sinus bradycardia and LAFB with TWI V4-V6, III and aVF   Benign essential HTN 05/29/2018   Cardiomyopathy (HCC)    a. dx by echo 07/2020.   Dilation of aorta (HCC)    Environmental allergies    Fracture, ribs    H/O: varicose veins    Hearing loss    Hyperlipidemia LDL goal <70    Multiple food allergies    Rash    Seasonal allergies    Sinusitis    Stroke [facility])    s/p tpa    Past Surgical History:  Procedure Laterality Date   COLONOSCOPY     INGUINAL HERNIA REPAIR Left    right hand surgery      Family History  Problem Relation Age of Onset   CVA Mother    Heart failure Father        CHF   Brain cancer Brother        GBM   Colon polyps Neg Hx  Colon cancer Neg Hx    Esophageal cancer Neg Hx    Rectal cancer Neg Hx    Stomach cancer Neg Hx     Prior to Admission medications   Medication Sig Start Date End Date Taking? Authorizing Provider  acetaminophen  (TYLENOL ) 650 MG CR tablet Take 1,300 mg by mouth every 8 (eight) hours as needed for pain.   Yes [provider]  aspirin  EC 81 MG tablet Take 81 mg by mouth at bedtime. Swallow whole.   Yes [provider]  Coenzyme Q10 (COQ-10) 100 MG CAPS Take 1 capsule by mouth in the morning and at bedtime. 11/02/21  Yes [provider]  finasteride  (PROSCAR ) 5 MG tablet TAKE 1 TABLET (5 MG TOTAL) BY MOUTH DAILY. 01/09/24 01/08/25 Yes Arellano Zameza, Priscila, MD  ibuprofen  (ADVIL ) 200 MG tablet Take 400 mg by mouth every 6 (six) hours as needed for mild pain (pain score 1-3).   Yes [provider]  metoprolol  succinate (TOPROL -XL) 25 MG 24 hr tablet TAKE 1 TABLET (25  MG) BY MOUTH DAILY. APPOINTMENT NEEDED FOR CONTINUATION OF REFILLS-1ST ATTEMPT Patient taking differently: Take 25 mg by mouth at bedtime. Take 1 tablet (25 mg) by mouth at bedtime. 01/24/24  Yes Turner, Wilbert SAUNDERS, MD  Multiple Vitamin (MULTIVITAMIN WITH MINERALS) TABS tablet Take 1 tablet by mouth daily.   Yes [provider]  N-ACETYL CYSTEINE 600 MG TABS Take 1 tablet by mouth at bedtime. 11/02/21  Yes [provider]  omeprazole  (PRILOSEC) 20 MG capsule TAKE 1 CAPSULE BY MOUTH EVERY DAY 01/27/24  Yes Arellano Zameza, Priscila, MD  OVER THE COUNTER MEDICATION Take 1 capsule by mouth at bedtime. PQQ: Pyrroloquinoline Quinone Take one capsule by mouth daily   Yes [provider]  RA CRANBERRY 500 MG CAPS Take 1 capsule by mouth in the morning and at bedtime. 11/02/21  Yes [provider]  rosuvastatin  (CRESTOR ) 10 MG tablet TAKE 1 TABLET BY MOUTH EVERY DAY Patient taking differently: Take 10 mg by mouth 4 (four) times a week. Take one tablet (10mg ) by mouth at bedtime. 12/20/23  Yes Arellano Zameza, Priscila, MD  sodium chloride  1 g tablet Take 2 tablets (2 g total) by mouth 3 (three) times daily with meals. 05/12/24  Yes Waymond Cart, MD  tamsulosin  (FLOMAX ) 0.4 MG CAPS capsule TAKE 1 CAPSULE BY MOUTH EVERY DAY Patient taking differently: Take 0.4 mg by mouth at bedtime. 01/24/24  Yes Arellano Zameza, Priscila, MD  albuterol  (VENTOLIN  HFA) 108 (90 Base) MCG/ACT inhaler Inhale 2 puffs into the lungs every 4 (four) hours as needed for wheezing or shortness of breath. Patient not taking: Reported on 05/08/2024 05/06/24   Raford Lenis, MD  meloxicam  (MOBIC ) 15 MG tablet Take 1 tablet (15 mg total) by mouth daily as needed for pain. Patient not taking: Reported on 05/16/2024 04/19/24   Syeda, Raeeha, DO  spironolactone  (ALDACTONE ) 25 MG tablet Take 0.5 tablets (12.5 mg total) by mouth daily. Patient not taking: Reported on 05/16/2024 01/27/24   Shlomo Wilbert SAUNDERS, MD    Current  Facility-Administered Medications  Medication Dose Route Frequency Provider Last Rate Last Admin   acetaminophen  (TYLENOL ) tablet 1,000 mg  1,000 mg Oral Q6H PRN Norrine Sharper, MD       pantoprazole  (PROTONIX ) injection 40 mg  40 mg Intravenous Q12H McLendon, Michael, MD   40 mg at 05/17/24 0855    Allergies as of 05/16/2024   (No Known Allergies)    Social History  Socioeconomic History   Marital status: Married    Spouse name: Not on file   Number of children: Not on file   Years of education: Not on file   Highest education level: Not on file  Occupational History   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  Tobacco Use   Smoking status: Former   Smokeless tobacco: Never   Tobacco comments:    quit 40+ years ago  Vaping Use   Vaping status: Never Used  Substance and Sexual Activity   Alcohol use: Yes    Comment: wine 3 x a week   Drug use: Never   Sexual activity: Yes    Partners: Female  Other Topics Concern   Not on file  Social History Narrative   Not on file   Social Drivers of Health   Financial Resource Strain: Low Risk  (01/19/2023)   Overall Financial Resource Strain (CARDIA)    Difficulty of Paying Living Expenses: Not hard at all  Food Insecurity: No Food Insecurity (05/16/2024)   Hunger Vital Sign    Worried About Running Out of Food in the Last Year: Never true    Ran Out of Food in the Last Year: Never true  Transportation Needs: No Transportation Needs (05/16/2024)   PRAPARE - Administrator, Civil Service (Medical): No    Lack of Transportation (Non-Medical): No  Physical Activity: Sufficiently Active (01/19/2023)   Exercise Vital Sign    Days of Exercise per Week: 7 days    Minutes of Exercise per Session: 30 min  Stress: No Stress Concern Present (01/19/2023)   [employer] of Occupational Health - Occupational Stress Questionnaire    Feeling of Stress : Not at all  Social Connections: Socially Integrated (05/16/2024)    Social Connection and Isolation Panel    Frequency of Communication with Friends and Family: More than three times a week    Frequency of Social Gatherings with Friends and Family: More than three times a week    Attends Religious Services: More than 4 times per year    Active Member of [employer] or Organizations: Yes    Attends Engineer, structural: More than 4 times per year    Marital Status: Married  Catering manager Violence: Not At Risk (05/16/2024)   Humiliation, Afraid, Rape, and Kick questionnaire    Fear of Current or Ex-Partner: No    Emotionally Abused: No    Physically Abused: No    Sexually Abused: No     Code Status   Code Status: Full Code  Review of Systems: All systems reviewed and negative except where noted in HPI.  Physical Exam: Vital signs in last 24 hours: Temp:  [97.4 F (36.3 C)-98.5 F (36.9 C)] 98.4 F (36.9 C) (09/18 1153) Pulse Rate:  [70-159] 71 (09/18 1153) Resp:  [15-18] 16 (09/18 1153) BP: (112-201)/(81-181) 130/89 (09/18 1153) SpO2:  [90 %-100 %] 100 % (09/18 1153)    General:  Pleasant male in NAD Psych:  Cooperative. Normal mood and affect Eyes: Pupils equal Ears:  Normal auditory acuity Nose: No deformity, discharge or lesions Neck:  Supple, no masses felt Lungs:  Clear to auscultation.  Heart:  Regular rate, regular rhythm.  Abdomen:  Soft, nondistended, nontender, active bowel sounds, no masses felt Rectal :  Deferred Msk: Symmetrical without gross deformities.  Neurologic:  Alert, oriented, grossly normal neurologically Extremities : No edema Skin:  Intact without significant lesions.    Intake/Output from previous  day: No intake/output data recorded. Intake/Output this shift:  No intake/output data recorded.   Vina Dasen, NP-C   05/17/2024, 12:01 PM  ---------------------------------------------------------------------  I have taken a history, reviewed the chart and examined the patient. I performed a  substantive portion of this encounter, including complete performance of at least one of the key components, in conjunction with the APP. I agree with the APP's note, impression and recommendations  73 year old male with recent admission for hyponatremia and questionable pancreatitis, who experienced large-volume maroon-colored stools yesterday in the setting of NSAID use.  CTA negative.  He continues to have upper abdominal discomfort. CTA incidentally noted gastric artery aneurysm and has been evaluated by IR with plans for angiography at some point.  Patient has not had any other episodes of bleeding since yesterday morning.  He remains hemodynamically stable.  Differential includes peptic ulcer disease given upper abdominal pain and recent NSAID use, but right sided diverticular bleeding also possible (he was then associated with abdominal pain).  Plan for upper endoscopy tomorrow  The details, risks (including bleeding, perforation, infection, missed lesions, medication reactions and possible hospitalization or surgery if complications occur), benefits, and alternatives to EGD with possible biopsy and possible dilation were discussed with the patient and he consents to proceed.    Edward Markson E. Stacia, MD [facility]

## 2024-05-17 NOTE — Progress Notes (Addendum)
 HD#1 SUBJECTIVE:  Patient Summary: Edward [name] is a 73 y.o. with a pertinent PMH of Nonischemic Cardiomyopathy, CVA in 2016, mild ascending aortic aneurysm, HTN, HLD, who presented with abdominal pain and hematochezia and admitted for further workup of GI bleeding.   Overnight Events: None.  Interim History: States that his pain initially came back a few days after going home.  Was using ibuprofen  to manage the pain.  Also states that he uses meloxicam  about 4 times a week.  Yesterday morning he saw lots of blood in his stool.  He has not eaten in the past 2 days and states that his pain has improved.  Now only slight pain rated at 2/10 and radiating to his back.  OBJECTIVE:  Vital Signs: Vitals:   05/16/24 2227 05/16/24 2228 05/17/24 0629 05/17/24 0801  BP:  (!) 134/95 (!) 115/96 136/88  Pulse: 94 92 (!) 104 94  Resp:    17  Temp:   98.5 F (36.9 C) 97.6 F (36.4 C)  TempSrc:   Oral   SpO2: 99% 98% 100% 100%    There were no vitals filed for this visit.  No intake or output data in the 24 hours ending 05/17/24 1127 Net IO Since Admission: No IO data has been entered for this period [05/17/24 1127]  Physical Exam: Constitutional: well-appearing, well-nourished, in no acute distress HENT: normocephalic atraumatic, mucous membranes moist Eyes: conjunctiva non-erythematous, PERRL, no scleral icterus Cardiovascular: regular rate and rhythm, no m/r/g Pulmonary/Chest: normal work of breathing on room air, lungs clear to auscultation bilaterally Abdominal: soft, non-tender, non-distended, bowel sounds normal, no masses palpated MSK: normal bulk and tone Neurological: alert & oriented x3, moving bilateral extremities equally Skin: warm and dry Extremities: no edema or cyanosis; peripheral pulses intact Psych: normal mood and affect, thought content normal  Patient Lines/Drains/Airways Status     Active Line/Drains/Airways     Name Placement date Placement time Site Days    Peripheral IV 05/16/24 20 G Anterior;Left;Proximal Forearm 05/16/24  1555  Forearm  1            Pertinent labs and imaging:     Latest Ref Rng & Units 05/17/2024    1:58 AM 05/16/2024    7:45 AM 05/16/2024    7:39 AM  CBC  WBC 4.0 - 10.5 K/uL 8.9   9.5   Hemoglobin 13.0 - 17.0 g/dL 9.7  88.7  88.4   Hematocrit 39.0 - 52.0 % 27.7  33.0  33.1   Platelets 150 - 400 K/uL 249   308        Latest Ref Rng & Units 05/17/2024    1:58 AM 05/16/2024    7:45 AM 05/16/2024    7:39 AM  CMP  Glucose 70 - 99 mg/dL 96  867  869   BUN 8 - 23 mg/dL 15  18  17    Creatinine 0.61 - 1.24 mg/dL 9.21  9.09  9.05   Sodium 135 - 145 mmol/L 127  130  129   Potassium 3.5 - 5.1 mmol/L 4.4  4.6  4.6   Chloride 98 - 111 mmol/L 95  95  96   CO2 22 - 32 mmol/L 22   25   Calcium  8.9 - 10.3 mg/dL 8.0   8.2   Total Protein 6.5 - 8.1 g/dL   5.7   Total Bilirubin 0.0 - 1.2 mg/dL   1.0   Alkaline Phos 38 - 126 U/L   69  AST 15 - 41 U/L   27   ALT 0 - 44 U/L   55     CT Angio Chest/Abd/Pel for Dissection W and/or W/WO Result Date: 05/16/2024 CLINICAL DATA:  Provided history: Acute aortic syndrome (AAS) suspected Technologist notes state abdominal pain.  Rectal bleeding. EXAM: CT ANGIOGRAPHY CHEST, ABDOMEN AND PELVIS TECHNIQUE: Non-contrast CT of the chest was initially obtained. Multidetector CT imaging through the chest, abdomen and pelvis was performed using the standard protocol during bolus administration of intravenous contrast. Multiplanar reconstructed images and MIPs were obtained and reviewed to evaluate the vascular anatomy. RADIATION DOSE REDUCTION: This exam was performed according to the departmental dose-optimization program which includes automated exposure control, adjustment of the mA and/or kV according to patient size and/or use of iterative reconstruction technique. CONTRAST:  75mL OMNIPAQUE  IOHEXOL  350 MG/ML SOLN COMPARISON:  Abdominopelvic CT 05/07/2024, chest CT 12/21/2022, abdominal CTA  08/08/2019 FINDINGS: CTA CHEST FINDINGS Cardiovascular: No aortic hematoma on unenhanced exam. Mildly aneurysmal ascending aorta at 4.1 cm. No dissection or acute aortic findings. No pulmonary embolus to the segmental level. The heart is borderline enlarged. No pericardial effusion. Mediastinum/Nodes: No mediastinal or hilar adenopathy. The esophagus is decompressed, tiny hiatal hernia. No thyroid  nodule. Lungs/Pleura: Elevated right hemidiaphragm. There is increasing atelectasis at the right lung base from recent abdominopelvic CT. 6-7 mm left lower lobe pulmonary nodule, series 7, image 120, unchanged over serial exams. Additional punctate left lower lobe nodule, series 7, image 94, is also unchanged. Again seen mild biapical pleuroparenchymal scarring. No features of pulmonary edema. No pleural effusion. Trachea and central airways are clear. Musculoskeletal: Right glenohumeral arthropathy. Mild degenerative change in the thoracic spine. There are no acute or suspicious osseous abnormalities. Review of the MIP images confirms the above findings. CTA ABDOMEN AND PELVIS FINDINGS VASCULAR Aorta: Normal caliber aorta without aneurysm, dissection, vasculitis or significant stenosis. Celiac: Separate origin of the hepatic and splenic arteries from the upper abdominal aorta. Questionable soft tissue thickening about the hepatic artery, reference series 9, image 74. An additional tiny artery arising from the abdominal aorta above the hepatic and celiac arteries, may be a small gastric artery, demonstrates 7 mm aneurysm, series 6, image 133. No adjacent stranding. SMA: Patent without evidence of aneurysm, dissection, vasculitis or significant stenosis. Renals: Both renal arteries are patent without evidence of aneurysm, dissection, vasculitis, fibromuscular dysplasia or significant stenosis. IMA: Patent without evidence of aneurysm, dissection, vasculitis or significant stenosis. Inflow: Patent without evidence of  aneurysm, dissection, vasculitis or significant stenosis. Veins: No obvious venous abnormality within the limitations of this arterial phase study. Review of the MIP images confirms the above findings. NON-VASCULAR Hepatobiliary: No focal abnormality on this arterial phase exam. Gallbladder physiologically distended, no calcified stone. No biliary dilatation. Pancreas: Improved peripancreatic edema from prior exam. No definite residual pancreatic inflammation. No pancreatic ductal dilatation. No peripancreatic collection. Spleen: Normal in size and arterial enhancement. Adrenals/Urinary Tract: No adrenal nodule. Again seen extrarenal pelvis configuration of the left kidney. No hydronephrosis. Again seen diffuse bladder wall thickening. Stomach/Bowel: Small hiatal hernia. Colonic diverticulosis without diverticulitis. No evidence of bowel inflammation or obstruction. The appendix is not definitively seen. There is no contrast accumulating in the GI tract to suggest GI bleed. Lymphatic: No enlarged lymph nodes in the abdomen or pelvis. Reproductive: Unremarkable prostate. Other: Diminished free fluid in the pelvis from recent CT. Moderate fat containing left inguinal hernia. Small fat containing umbilical hernia. Musculoskeletal: Mild scoliosis and degenerative change in the spine. Review of the  MIP images confirms the above findings. IMPRESSION: 1. No aortic dissection or acute aortic abnormality. Mildly aneurysmal ascending thoracic aorta at 4.1 cm. Recommend annual imaging follow-up by CTA or MRA. 2. No evidence of GI bleed. 3. Questionable soft tissue thickening about the hepatic artery which may be secondary to vasculitis/inflammation. 4. 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 Consider IR consultation. 5. Improved peripancreatic edema from prior exam. No definite residual pancreatic inflammation. 6. Colonic diverticulosis without diverticulitis.  7. Diffuse bladder wall thickening, also present on prior. This may be due to chronic bladder outlet obstruction or cystitis. 8. Left lower lobe pulmonary nodules measuring up to 6-7 mm, unchanged over serial exams. No specific imaging follow-up is needed. 9. Small hiatal hernia. 10. Fat containing left inguinal and umbilical hernias. Electronically Signed   By: Andrea Gasman M.D.   On: 05/16/2024 17:03   DG Chest Portable 1 View Result Date: 05/16/2024 CLINICAL DATA:  Acute epigastric abdominal pain. EXAM: PORTABLE CHEST 1 VIEW COMPARISON:  May 07, 2024. FINDINGS: The heart size and mediastinal contours are within normal limits. Minimal bibasilar subsegmental atelectasis is noted. The visualized skeletal structures are unremarkable. IMPRESSION: Minimal bibasilar subsegmental atelectasis. Electronically Signed   By: Lynwood Landy Raddle M.D.   On: 05/16/2024 16:04    ASSESSMENT/PLAN:  Assessment: Principal Problem:   Gastric artery aneurysm (HCC) Active Problems:   Benign essential HTN   Ascending aorta dilation (HCC)   Nonischemic cardiomyopathy (HCC)   GERD (gastroesophageal reflux disease)   BPH (benign prostatic hyperplasia)   Hyponatremia   Abdominal pain, epigastric   Hematochezia   Normocytic anemia   Plan: #Gastric Artery Aneurysm #Hematochezia #Epigastric Abdominal Pain Patient's initial abdominal pain has improved slightly.  Has not had any maroon-colored stools while he has been in the hospital.  Vitals are stable. ED Exam showed FOBT+. On exam today abdomen is non-tender with no masses palpated. Hgb with slight drop to 9.7. Troponin negative, doubt cardiac cause of symptoms. Symptoms could be in the setting of CT angio findings of gastric artery aneurysm. Not likely mesenteric ischemia as no significant stenosis of IMA/SMA found on imaging. He did recently have pancreatitis and question whether this pain is sequelae from recent inflammation with hematochezia from  diverticulosis found on imaging. Vasculitis low on differential with normal ESR/CRP despite aneurysm and possible hepatic artery thickening.  Importantly, the patient has been taking NSAIDs for pain control over the past week.  Bleeding could be due to gastric ulcer.  IR consulted d/t gastric artery aneurysm and will evaluate patient today.  GI also consulted, will follow-up with their recommendations. - IR consulted, appreciate recs - NPO pending possible procedure - GI consulted, appreciate recs - Hold home Aspirin  81 mg daily - IV pantoprazole  40 mg twice daily  #Hyponatremia Recently admitted to IM TS for sodium down to 115.  This admission, sodium was 129, now with slight downtrend.  Will continue prior treatment for suspected SIADH.  Hold spironolactone  and meloxicam . - continue salt tablets once he is not NPO - fluid restriction  #Diverticulosis Colonic diverticulosis without diverticulitis found on imaging. May be related to his hematochezia as above, but less likely. Will continue to monitor for symptoms. Encourage increased fiber intake in outpatient setting.   #Nonischemic cardiomyopathy #Combined systolic and diastolic heart failure Most recent echo on 09/30/2023 showing improvement of LVEF to 45-50%, grade 1 diastolic dysfunction.  Consider restarting ACEi/ARB and SGLT2i in  the outpatient setting after resolution of UTI. - Hold home metoprolol  succinate 25 mg daily while NPO   #Hx of CVA  Ischemic CVA in 2016 s/p tpa administration. Holding home meds due to NPO. He should likely be on a higher dose of his Crestor  for secondary ASCVD prevention. Consider outpatient follow-up. - Hold home ASA 81 mg daily d/t concerns for GI bleeding - Hold home Crestor  10 mg daily d/t NPO  #Asymptomatic aortic aneurysm Follows with Dr. Shlomo with cardiology.  Chest CT in 12/21/2022 showed a 4 cm ascending aorta. CTA chest/abd/pelv this admission showing 4.1 cm with recommended annual imaging with  CTA/MRA.   #Sinus bradycardia Has history of bradycardia but hasn't had low HR since admission.  Follows with Dr. Shlomo with outpatient Cardiology. Will continue to monitor.   #Hypertension Blood pressure has been normal while inpatient.  Holding spironolactone  d/t c/f medication-induced SIADH.  Holding home metoprolol  succinate.   #GERD Takes PO pantoprazole  which we will switch to IV formulation due to gastric artery aneurysm with suspected GI bleeding. - Continue IV pantoprazole  40 mg daily  #Hiatal Hernia #Inguinal Hernia #Umbilical Hernia Abdominal pain and symptoms not consistent with hernia as cause. These findings were seen on CT imaging during this hospitalization. Will follow up outpatient for further monitoring.    #BPH Hold home meds while NPO. Takes Tamsulosin  0.4 mg and Finasteride  5 mg.  Best Practice: Diet: NPO for possible procedure IVF: Fluids: None, Rate: None VTE: SCDs Start: 05/16/24 1900 Code: Full  Disposition planning: Therapy Recs: None, DME: none Family Contact: Dayan Desa (spouse), to be notified DISPO: Anticipated to Home pending further workup and clinical improvement.  Signature:  Letha Cheadle, MD [facility]  PGY-1 05/17/2024, 11:27 AM  On Call pager [phone]

## 2024-05-17 NOTE — Plan of Care (Signed)
 Patient arrived calm and cooperative A&O X4, wife at bedside. Patient ambulatory no BM reported this shift. Patient left with call bell in reach side rails up and bed in lowest position.  Problem: Education: Goal: Knowledge of General Education information will improve Description: Including pain rating scale, medication(s)/side effects and non-pharmacologic comfort measures Outcome: Progressing   Problem: Health Behavior/Discharge Planning: Goal: Ability to manage health-related needs will improve Outcome: Progressing   Problem: Activity: Goal: Risk for activity intolerance will decrease Outcome: Progressing   Problem: Nutrition: Goal: Adequate nutrition will be maintained Outcome: Progressing   Problem: Coping: Goal: Level of anxiety will decrease Outcome: Progressing   Problem: Elimination: Goal: Will not experience complications related to bowel motility Outcome: Progressing   Problem: Pain Managment: Goal: General experience of comfort will improve and/or be controlled Outcome: Progressing   Problem: Safety: Goal: Ability to remain free from injury will improve Outcome: Progressing   Problem: Skin Integrity: Goal: Risk for impaired skin integrity will decrease Outcome: Progressing

## 2024-05-18 ENCOUNTER — Encounter (HOSPITAL_COMMUNITY)
Admission: EM | Disposition: A | Discharge status: Home / Self Care | Payer: Self-pay | Source: Home / Self Care | Attending: Internal Medicine

## 2024-05-18 ENCOUNTER — Telehealth: Payer: Self-pay

## 2024-05-18 ENCOUNTER — Other Ambulatory Visit: Payer: Self-pay

## 2024-05-18 ENCOUNTER — Inpatient Hospital Stay (HOSPITAL_COMMUNITY): Admitting: Anesthesiology

## 2024-05-18 ENCOUNTER — Encounter (HOSPITAL_COMMUNITY): Payer: Self-pay | Admitting: Infectious Diseases

## 2024-05-18 DIAGNOSIS — I639 Cerebral infarction, unspecified: Secondary | ICD-10-CM

## 2024-05-18 DIAGNOSIS — R1013 Epigastric pain: Secondary | ICD-10-CM

## 2024-05-18 DIAGNOSIS — I10 Essential (primary) hypertension: Secondary | ICD-10-CM | POA: Diagnosis not present

## 2024-05-18 DIAGNOSIS — K2970 Gastritis, unspecified, without bleeding: Secondary | ICD-10-CM

## 2024-05-18 DIAGNOSIS — K921 Melena: Secondary | ICD-10-CM | POA: Diagnosis not present

## 2024-05-18 DIAGNOSIS — Z87891 Personal history of nicotine dependence: Secondary | ICD-10-CM | POA: Diagnosis not present

## 2024-05-18 DIAGNOSIS — I728 Aneurysm of other specified arteries: Secondary | ICD-10-CM | POA: Diagnosis not present

## 2024-05-18 DIAGNOSIS — K2950 Unspecified chronic gastritis without bleeding: Secondary | ICD-10-CM

## 2024-05-18 DIAGNOSIS — E871 Hypo-osmolality and hyponatremia: Secondary | ICD-10-CM | POA: Diagnosis not present

## 2024-05-18 DIAGNOSIS — K317 Polyp of stomach and duodenum: Secondary | ICD-10-CM

## 2024-05-18 DIAGNOSIS — K859 Acute pancreatitis without necrosis or infection, unspecified: Secondary | ICD-10-CM

## 2024-05-18 DIAGNOSIS — D62 Acute posthemorrhagic anemia: Secondary | ICD-10-CM

## 2024-05-18 DIAGNOSIS — K922 Gastrointestinal hemorrhage, unspecified: Secondary | ICD-10-CM

## 2024-05-18 HISTORY — PX: ESOPHAGOGASTRODUODENOSCOPY: SHX5428

## 2024-05-18 HISTORY — PX: BIOPSY OF SKIN SUBCUTANEOUS TISSUE AND/OR MUCOUS MEMBRANE: SHX6741

## 2024-05-18 LAB — CBC
HCT: 28.4 % — ABNORMAL LOW (ref 39.0–52.0)
Hemoglobin: 10.2 g/dL — ABNORMAL LOW (ref 13.0–17.0)
MCH: 31.3 pg (ref 26.0–34.0)
MCHC: 35.9 g/dL (ref 30.0–36.0)
MCV: 87.1 fL (ref 80.0–100.0)
Platelets: 245 K/uL (ref 150–400)
RBC: 3.26 MIL/uL — ABNORMAL LOW (ref 4.22–5.81)
RDW: 12.3 % (ref 11.5–15.5)
WBC: 6.7 K/uL (ref 4.0–10.5)
nRBC: 0 % (ref 0.0–0.2)

## 2024-05-18 LAB — GLUCOSE, CAPILLARY: Glucose-Capillary: 90 mg/dL (ref 70–99)

## 2024-05-18 LAB — BASIC METABOLIC PANEL WITH GFR
Anion gap: 8 (ref 5–15)
BUN: 9 mg/dL (ref 8–23)
CO2: 24 mmol/L (ref 22–32)
Calcium: 8.2 mg/dL — ABNORMAL LOW (ref 8.9–10.3)
Chloride: 97 mmol/L — ABNORMAL LOW (ref 98–111)
Creatinine, Ser: 0.85 mg/dL (ref 0.61–1.24)
GFR, Estimated: >60 mL/min (ref >60)
Glucose, Bld: 88 mg/dL (ref 70–99)
Potassium: 4 mmol/L (ref 3.5–5.1)
Sodium: 129 mmol/L — ABNORMAL LOW (ref 135–145)

## 2024-05-18 LAB — PROTIME-INR
INR: 1 (ref 0.8–1.2)
Prothrombin Time: 14.2 s (ref 11.4–15.2)

## 2024-05-18 SURGERY — EGD (ESOPHAGOGASTRODUODENOSCOPY)
Anesthesia: Monitor Anesthesia Care

## 2024-05-18 MED ORDER — PROPOFOL 10 MG/ML IV BOLUS
INTRAVENOUS | Status: DC | PRN
  Administered 2024-05-18: 70 mg via INTRAVENOUS
  Administered 2024-05-18: 50 mg via INTRAVENOUS
  Administered 2024-05-18: 30 mg via INTRAVENOUS

## 2024-05-18 MED ORDER — LIDOCAINE 2% (20 MG/ML) 5 ML SYRINGE
INTRAMUSCULAR | Status: DC | PRN
  Administered 2024-05-18: 100 mg via INTRAVENOUS

## 2024-05-18 MED ORDER — PANTOPRAZOLE SODIUM 40 MG PO TBEC: 40.0000 mg | DELAYED_RELEASE_TABLET | Freq: Two times a day (BID) | ORAL | 0 refills | Status: DC

## 2024-05-18 MED ORDER — PHENYLEPHRINE 80 MCG/ML (10ML) SYRINGE FOR IV PUSH (FOR BLOOD PRESSURE SUPPORT)
PREFILLED_SYRINGE | INTRAVENOUS | Status: DC | PRN
  Administered 2024-05-18 (×2): 160 ug via INTRAVENOUS

## 2024-05-18 NOTE — Progress Notes (Signed)
 Interventional Radiology Brief Note:  Patient clinically stable with improvement his in hemoglobin and no further bloody bowel movement.  S/p EGD this AM with no acute findings.  He is stable for d/c home per medical team and desires follow-up with IR as an outpatient for further evaluation of his possible gastric artery aneurysm.   Outpatient follow-up has been ordered.   Nkenge Sonntag, MS RD PA-C

## 2024-05-18 NOTE — Anesthesia Preprocedure Evaluation (Signed)
 Anesthesia Evaluation  Patient identified by MRN, date of birth, ID band Patient awake    Reviewed: Allergy & Precautions, NPO status , Patient's Chart, lab work & pertinent test results  Airway Mallampati: II  TM Distance: >3 FB Neck ROM: Full    Dental no notable dental hx.    Pulmonary neg pulmonary ROS, former smoker   Pulmonary exam normal        Cardiovascular hypertension, Pt. on medications and Pt. on home beta blockers  Rhythm:Regular Rate:Normal     Neuro/Psych CVA  negative psych ROS   GI/Hepatic Neg liver ROS,GERD  Medicated,,GIB   Endo/Other  negative endocrine ROS    Renal/GU negative Renal ROS  negative genitourinary   Musculoskeletal   Abdominal Normal abdominal exam  (+)   Peds  Hematology  (+) Blood dyscrasia, anemia Lab Results      Component                Value               Date                      WBC                      6.7                 05/18/2024                HGB                      10.2 (L)            05/18/2024                HCT                      28.4 (L)            05/18/2024                MCV                      87.1                05/18/2024                PLT                      245                 05/18/2024             Lab Results      Component                Value               Date                      NA                       129 (L)             05/18/2024                K  4.0                 05/18/2024                CO2                      24                  05/18/2024                GLUCOSE                  88                  05/18/2024                BUN                      9                   05/18/2024                CREATININE               0.85                05/18/2024                CALCIUM                   8.2 (L)             05/18/2024                EGFR                     71                  08/22/2023                 GFRNONAA                 >60                 05/18/2024              Anesthesia Other Findings   Reproductive/Obstetrics                              Anesthesia Physical Anesthesia Plan  ASA: 2  Anesthesia Plan:    Post-op Pain Management:    Induction: Intravenous  PONV Risk Score and Plan: 1 and Propofol  infusion and Treatment may vary due to age or medical condition  Airway Management Planned: Simple Face Mask and Nasal Cannula  Additional Equipment: None  Intra-op Plan:   Post-operative Plan:   Informed Consent: I have reviewed the patients History and Physical, chart, labs and discussed the procedure including the risks, benefits and alternatives for the proposed anesthesia with the patient or authorized representative who has indicated his/her understanding and acceptance.     Dental advisory given  Plan Discussed with: CRNA  Anesthesia Plan Comments:         Anesthesia Quick Evaluation

## 2024-05-18 NOTE — Op Note (Signed)
 [facility] Patient Name: Edward [name] Procedure Date : 05/18/2024 MRN: [id number] Attending MD: Glendia BRAVO. [name] , MD, [id number] Date of Birth: 1951-02-26 CSN: [id number] Age: 73 Admit Type: Inpatient Procedure:                Upper GI endoscopy Indications:              Gastrointestinal bleeding of unknown origin, maroon                            colored stools, neg CT-A. Frequent NSAID use Providers:                Glendia E. Stacia, MD, Gregoria Pierce, RN,                            Haskel Chris, Technician Referring MD:              Medicines:                Monitored Anesthesia Care Complications:            No immediate complications. Estimated Blood Loss:     Estimated blood loss was minimal. Procedure:                Pre-Anesthesia Assessment:                           - Prior to the procedure, a History and Physical                            was performed, and patient medications and                            allergies were reviewed. The patient's tolerance of                            previous anesthesia was also reviewed. The risks                            and benefits of the procedure and the sedation                            options and risks were discussed with the patient.                            All questions were answered, and informed consent                            was obtained. Prior Anticoagulants: The patient has                            taken no anticoagulant or antiplatelet agents                            except for aspirin . ASA Grade Assessment: II - A  patient with mild systemic disease. After reviewing                            the risks and benefits, the patient was deemed in                            satisfactory condition to undergo the procedure.                           After obtaining informed consent, the endoscope was                            passed under direct vision.  Throughout the                            procedure, the patient's blood pressure, pulse, and                            oxygen saturations were monitored continuously. The                            [id number]-[id number] ([id number]) Olympus endoscope was introduced                            through the mouth, and advanced to the second part                            of duodenum. The upper GI endoscopy was                            accomplished without difficulty. The patient                            tolerated the procedure well. Scope In: Scope Out: Findings:      The examined portions of the nasopharynx, oropharynx and larynx were       normal.      The examined esophagus was normal.      Localized mild inflammation characterized by erythema was found on the       posterior wall of the stomach. Biopsies were taken with a cold forceps       for Helicobacter pylori testing. Estimated blood loss was minimal.      Multiple small sessile polypsof different morphologies were found in the       gastric body. Biopsies were taken from two polyps with a cold forceps       for histology. Estimated blood loss was minimal.      The exam of the stomach was otherwise normal.      The examined duodenum was normal. Impression:               - The examined portions of the nasopharynx,                            oropharynx and larynx were normal.                           -  Normal esophagus.                           - Gastritis. Biopsied.                           - Multiple gastric polyps. Biopsied. Suspect                            mixture of fundic gland polyps and hyperplastic                            polyps.                           - Normal examined duodenum.                           - No endoscopic findings to explain GI bleeding.                           - Suspect diverticular bleed Moderate Sedation:      N/A Recommendation:           - Return patient to hospital ward for ongoing care.                            - Resume regular diet.                           - Continue present medications.                           - Await pathology results.                           - No further endoscopic plans.                           - If no further bleeding, can discharge home.                           - Would avoid NSAIDs if possible. Use Tylenol                             preferentially for pain.                           - Patient can follow up in GI office to further                            evaluate persitent epigatric pain and recent                            pancreatitis episode.                           - GI will sign off at this time. Procedure  Code(s):        --- Professional ---                           [phone], Esophagogastroduodenoscopy, flexible,                            transoral; with biopsy, single or multiple Diagnosis Code(s):        --- Professional ---                           K29.70, Gastritis, unspecified, without bleeding                           K31.7, Polyp of stomach and duodenum                           K92.2, Gastrointestinal hemorrhage, unspecified CPT copyright 2022 [employer]. All rights reserved. The codes documented in this report are preliminary and upon coder review may  be revised to meet current compliance requirements. Edward Shear E. Stacia, MD 05/18/2024 10:03:31 AM This report has been signed electronically. Number of Addenda: 0

## 2024-05-18 NOTE — Interval H&P Note (Signed)
 History and Physical Interval Note:  05/18/2024 9:07 AM  [name]  has presented today for surgery, with the diagnosis of Gastrointestinal bleeding.  The various methods of treatment have been discussed with the patient and family. After consideration of risks, benefits and other options for treatment, the patient has consented to  Procedure(s): EGD (ESOPHAGOGASTRODUODENOSCOPY) (N/A) as a surgical intervention.  The patient's history has been reviewed, patient examined, no change in status, stable for surgery.  I have reviewed the patient's chart and labs.  Questions were answered to the patient's satisfaction.     Glendia FORBES Holt

## 2024-05-18 NOTE — Transfer of Care (Signed)
 Immediate Anesthesia Transfer of Care Note  Patient: Edward [name]  Procedure(s) Performed: EGD (ESOPHAGOGASTRODUODENOSCOPY) BIOPSY, SKIN, SUBCUTANEOUS TISSUE, OR MUCOUS MEMBRANE  Patient Location: Endoscopy Unit  Anesthesia Type:MAC  Level of Consciousness: awake, alert , and oriented  Airway & Oxygen Therapy: Patient Spontanous Breathing and Patient connected to nasal cannula oxygen  Post-op Assessment: Report given to RN and Post -op Vital signs reviewed and stable  Post vital signs: Reviewed and stable  Last Vitals:  Vitals Value Taken Time  BP    Temp    Pulse    Resp    SpO2      Last Pain:  Vitals:   05/18/24 0852  TempSrc: Temporal  PainSc: 0-No pain      Patients Stated Pain Goal: 0 (05/17/24 0134)  Complications: No notable events documented.

## 2024-05-18 NOTE — Discharge Summary (Signed)
 Name: Edward [name]: [id number] DOB: 06-Jul-1951 73 y.o. PCP: Francesco Elsie NOVAK, MD  Date of Admission: 05/16/2024  7:18 AM Date of Discharge: 05/18/2024  Attending Physician: Dr. MICAEL Riis Winfrey  Discharge Diagnosis: Principal Problem:   Gastric artery aneurysm [facility]) Active Problems:   Benign essential HTN   Ascending aorta dilation (HCC)   Nonischemic cardiomyopathy (HCC)   GERD (gastroesophageal reflux disease)   BPH (benign prostatic hyperplasia)   Hyponatremia   Abdominal pain, epigastric   Hematochezia   Normocytic anemia   Abdominal pain   Bloody stools   Gastrointestinal hemorrhage   Acute blood loss anemia   Discharge Medications: Allergies as of 05/18/2024   No Known Allergies      Medication List     PAUSE taking these medications    meloxicam  15 MG tablet Wait to take this until your doctor or other care provider tells you to start again. Commonly known as: MOBIC  Take 1 tablet (15 mg total) by mouth daily as needed for pain.   spironolactone  25 MG tablet Wait to take this until your doctor or other care provider tells you to start again. Commonly known as: ALDACTONE  Take 0.5 tablets (12.5 mg total) by mouth daily.       TAKE these medications    acetaminophen  650 MG CR tablet Commonly known as: TYLENOL  Take 1,300 mg by mouth every 8 (eight) hours as needed for pain.   albuterol  108 (90 Base) MCG/ACT inhaler Commonly known as: VENTOLIN  HFA Inhale 2 puffs into the lungs every 4 (four) hours as needed for wheezing or shortness of breath.   aspirin  EC 81 MG tablet Take 81 mg by mouth at bedtime. Swallow whole.   CoQ-10 100 MG Caps Take 1 capsule by mouth in the morning and at bedtime.   finasteride  5 MG tablet Commonly known as: PROSCAR  TAKE 1 TABLET (5 MG TOTAL) BY MOUTH DAILY.   ibuprofen  200 MG tablet Commonly known as: ADVIL  Take 400 mg by mouth every 6 (six) hours as needed for mild pain (pain score 1-3).   metoprolol   succinate 25 MG 24 hr tablet Commonly known as: TOPROL -XL TAKE 1 TABLET (25 MG) BY MOUTH DAILY. APPOINTMENT NEEDED FOR CONTINUATION OF REFILLS-1ST ATTEMPT What changed: See the new instructions.   multivitamin with minerals Tabs tablet Take 1 tablet by mouth daily.   N-Acetyl Cysteine 600 MG Tabs Generic drug: Acetylcysteine (Nutrient) Take 1 tablet by mouth at bedtime.   omeprazole  20 MG capsule Commonly known as: PRILOSEC TAKE 1 CAPSULE BY MOUTH EVERY DAY   OVER THE COUNTER MEDICATION Take 1 capsule by mouth at bedtime. PQQ: Pyrroloquinoline Quinone Take one capsule by mouth daily   RA Cranberry 500 MG Caps Generic drug: Cranberry Take 1 capsule by mouth in the morning and at bedtime.   rosuvastatin  10 MG tablet Commonly known as: CRESTOR  TAKE 1 TABLET BY MOUTH EVERY DAY What changed:  when to take this additional instructions   sodium chloride  1 g tablet Take 2 tablets (2 g total) by mouth 3 (three) times daily with meals.   tamsulosin  0.4 MG Caps capsule Commonly known as: FLOMAX  TAKE 1 CAPSULE BY MOUTH EVERY DAY What changed: when to take this        Disposition and follow-up:   Edward [name] was discharged from [facility] in Good condition.  At the hospital follow up visit please address:  1.  Follow-up:   a. Diverticulosis -assess the patient for further  signs of GI bleeding.  He already has follow-up scheduled with GI for this problem as well as recent pancreatitis.  They would like him to complete a RUQ US .  Please ensure that the patient completes this before his GI appointment.    b.  Gastric artery aneurysm - please ensure the patient has called IR to schedule follow-up for potential angiography in the outpatient setting with Dr. Jenna or a member of his team.  This was likely an incidental finding on CT angio and so this was not completed during hospitalization.   c.  Gastritis - patient had evidence of gastritis upon EGD.   Surgical pathology is pending for H. pylori.  Patient already has GI follow-up scheduled as above.  Please ensure he is taking his PPI 40 mg twice daily for 1 month, can switch to 40 mg daily afterwards.  Also ensure the patient is not taking meloxicam , ibuprofen , or other NSAIDs.   d.  Hyponatremia - please ensure the patient is restricting fluids and taking salt tablets with meals.  Can reassess BMP for sodium value to see if salt tablets should be continued.  As per previous discharge summary, the patient's hyponatremia likely due to SIADH.  May be medication induced, assess whether spironolactone  should be continued.  2.  Labs / imaging needed at time of follow-up: BMP  3.  Pending labs/ test needing follow-up: Surgical pathology from EGD   Follow-up Appointments:  Follow-up Information     Jenna Cordella LABOR, MD Follow up.   Specialties: Radiology, Interventional Radiology Why: Follow-up with Interventional Radiology, Dr. Jenna, for further evaluation of gastric artery aneurysm. Contact information: [address] [place] [address] [place] [address] [phone]         Elicia Sharper, DO. Go on 05/30/2024.   Specialty: Internal Medicine Why: You have an appointment at 9:15am with Dr. Elicia. Please arrive 15 minutes early for your appointment. Contact information: [address] [place] [address] [phone]         Cayetano Harlene BIRCH, PA-C. Go on 06/15/2024.   Specialty: Gastroenterology Why: You have an appointment with Harlene Cayetano, PA-C (GI) at 10:20 AM Contact information: [address] [place] [address] [phone]                 Hospital Course by problem list: #Hematochezia #Epigastric Abdominal Pain #Diverticulosis Patient presented to ED after an episode of hematochezia and epigastric pain. Labs initially showed a mild anemia, which showed a slight downtrend. CT on admission was significant for diverticulosis without signs of  diverticulitis, but did not show any acute bleeding. Initial concern was for a bleeding ulcer due to ibuprofen  and meloxicam  use. He was treated with pantoprazole  and his home aspirin  was held. GI was consulted. They performed an EGD which was showed for gastritis and gastric polyps that were biopsied for H. pylori. There was no upper GI bleeding and diverticular bleed was suspected. GI recommended that he continue pantoprazole , avoid NSAIDs, and follow up with outpatient RUQUS for persistent epigastric pain and history of pancreatitis. On day of discharge, he is feeling well and ready to go home. Stated that he still has mild epigastric tenderness to palpation. Follow up with GI is scheduled for ongoing pancreatitis and abdominal pain as well as for biopsy results. We restarted aspirin  upon discharge.   #Gastritis Gastric polyps Gastritis and gastric polyps discovered on EGD searching for GI bleed. Biopsies taken. He will take Protonix  40 mg BID  for one month followed by 40 mg once per day after discharge. GI follow up is scheduled for follow up on biopsy results and abdominal pain.   #Gastric Artery Aneurysm Gastric artery aneurysm found incidentally on CT abdomen on admission. Signs of possible vasculitis also present, although normal CRP, ESR argue against this. Initially there was some concern of aneurysm contributing to hematochezia and abdominal pain. This is less likely as diverticular cause of bleeding is now suspected. IR consulted considered angiogram with possible intervention, although this was not urgent. Outpatient follow up for further evaluation and angiogram is reasonable per IR.    #Hyponatremia Recently admitted to IM TS for sodium down to 115. Sodium mildly decreased to 129, but overall stable throughout stay. Fluids restricted during his stay. Salt tablets held while patient was NPO waiting for EGD. Continue prior treatment for suspected SIADH. Continue fluid restriction and restart  salt tablets when able to eat. Continue to hold spironolactone  and meloxicam .  #Nonischemic cardiomyopathy #Combined systolic and diastolic heart failure Most recent echo on 09/30/2023 showing improvement of LVEF to 45-50%, grade 1 diastolic dysfunction. Metoprolol  held due to NPO. Can restart home metoprolol  after discharge.    #Hx of CVA  Ischemic CVA in 2016 s/p tpa administration. Held home ASA and Crestor  due to NPO. Restart home meds after discharge.   #Asymptomatic aortic aneurysm Follows with Dr. Shlomo with cardiology. Chest CT in 12/21/2022 showed a 4 cm ascending aorta. CTA chest/abd/pelv this admission showing 4.1 cm with recommended annual imaging with CTA/MRA. No acute concern this admission.    #Sinus bradycardia Has history of bradycardia. Follows with Dr. Shlomo with outpatient Cardiology. No concerns during this admission.   #Hypertension Blood pressure has been normal while inpatient. Holding spironolactone  d/t c/f medication-induced SIADH.  Held home metoprolol  succinate due to NPO. Restart Metoprolol  after discharge.   #GERD Takes PO pantoprazole  at home. Switched to IV formulation in the setting of GI bleeding with concern for gastric ulcer. Upon discharge he was switched to PO pantoprazole  40 mg bid for a month then 40 mg daily after.   #Hiatal Hernia #Inguinal Hernia #Umbilical Hernia Hernia found incidentally on CT. Per chart review patient has had prior hernia repair. Abdominal pain and hematochezia not consistent with hernia as cause. Consider outpatient follow up for further monitoring.    #BPH Held home Tamsulosin  0.4 mg and Finasteride  5 mg while NPO. Restart home meds upon discharge.   Discharge Subjective: Edward [name] reports his abdominal pain is improved. He would like to have his IR procedure done in the outpatient setting because he hasn't eaten in several days. Thinks his pain may be related to some constipation too. Patient is medically ready for  discharge.  Discharge Exam:   BP (!) 126/107 (BP Location: Right Arm)   Pulse 97   Temp 97.8 F (36.6 C) (Oral)   Resp 20   Ht 6' 1 (1.854 m)   Wt 98 kg   SpO2 100%   BMI 28.50 kg/m  Physical Exam: Constitutional: well-appearing, well-nourished, in no acute distress HENT: normocephalic atraumatic, mucous membranes moist Eyes: conjunctiva non-erythematous, PERRL, no scleral icterus Cardiovascular: regular rate and rhythm, no m/r/g Pulmonary/Chest: normal work of breathing on room air, anterior lung fields CTAB Abdominal: soft, mildly tender in mid-epigastric region, non-distended, bowel sounds normal MSK: normal bulk and tone Neurological: alert & oriented x3 Skin: warm and dry Extremities: no edema or cyanosis; peripheral pulses intact Psych: normal mood and affect, thought content normal  Pertinent Labs, Studies, and Procedures:     Latest Ref Rng & Units 05/18/2024    3:54 AM 05/17/2024    1:58 AM 05/16/2024    7:45 AM  CBC  WBC 4.0 - 10.5 K/uL 6.7  8.9    Hemoglobin 13.0 - 17.0 g/dL 89.7  9.7  88.7   Hematocrit 39.0 - 52.0 % 28.4  27.7  33.0   Platelets 150 - 400 K/uL 245  249         Latest Ref Rng & Units 05/18/2024    3:54 AM 05/17/2024    1:58 AM 05/16/2024    7:45 AM  CMP  Glucose 70 - 99 mg/dL 88  96  867   BUN 8 - 23 mg/dL 9  15  18    Creatinine 0.61 - 1.24 mg/dL 9.14  9.21  9.09   Sodium 135 - 145 mmol/L 129  127  130   Potassium 3.5 - 5.1 mmol/L 4.0  4.4  4.6   Chloride 98 - 111 mmol/L 97  95  95   CO2 22 - 32 mmol/L 24  22    Calcium  8.9 - 10.3 mg/dL 8.2  8.0      CT Angio Chest/Abd/Pel for Dissection W and/or W/WO Result Date: 05/16/2024 CLINICAL DATA:  Provided history: Acute aortic syndrome (AAS) suspected Technologist notes state abdominal pain.  Rectal bleeding. EXAM: CT ANGIOGRAPHY CHEST, ABDOMEN AND PELVIS TECHNIQUE: Non-contrast CT of the chest was initially obtained. Multidetector CT imaging through the chest, abdomen and pelvis was performed  using the standard protocol during bolus administration of intravenous contrast. Multiplanar reconstructed images and MIPs were obtained and reviewed to evaluate the vascular anatomy. RADIATION DOSE REDUCTION: This exam was performed according to the departmental dose-optimization program which includes automated exposure control, adjustment of the mA and/or kV according to patient size and/or use of iterative reconstruction technique. CONTRAST:  75mL OMNIPAQUE  IOHEXOL  350 MG/ML SOLN COMPARISON:  Abdominopelvic CT 05/07/2024, chest CT 12/21/2022, abdominal CTA 08/08/2019 FINDINGS: CTA CHEST FINDINGS Cardiovascular: No aortic hematoma on unenhanced exam. Mildly aneurysmal ascending aorta at 4.1 cm. No dissection or acute aortic findings. No pulmonary embolus to the segmental level. The heart is borderline enlarged. No pericardial effusion. Mediastinum/Nodes: No mediastinal or hilar adenopathy. The esophagus is decompressed, tiny hiatal hernia. No thyroid  nodule. Lungs/Pleura: Elevated right hemidiaphragm. There is increasing atelectasis at the right lung base from recent abdominopelvic CT. 6-7 mm left lower lobe pulmonary nodule, series 7, image 120, unchanged over serial exams. Additional punctate left lower lobe nodule, series 7, image 94, is also unchanged. Again seen mild biapical pleuroparenchymal scarring. No features of pulmonary edema. No pleural effusion. Trachea and central airways are clear. Musculoskeletal: Right glenohumeral arthropathy. Mild degenerative change in the thoracic spine. There are no acute or suspicious osseous abnormalities. Review of the MIP images confirms the above findings. CTA ABDOMEN AND PELVIS FINDINGS VASCULAR Aorta: Normal caliber aorta without aneurysm, dissection, vasculitis or significant stenosis. Celiac: Separate origin of the hepatic and splenic arteries from the upper abdominal aorta. Questionable soft tissue thickening about the hepatic artery, reference series 9, image  74. An additional tiny artery arising from the abdominal aorta above the hepatic and celiac arteries, may be a small gastric artery, demonstrates 7 mm aneurysm, series 6, image 133. No adjacent stranding. SMA: Patent without evidence of aneurysm, dissection, vasculitis or significant stenosis. Renals: Both renal arteries are patent without evidence of aneurysm, dissection, vasculitis, fibromuscular dysplasia or significant stenosis. IMA: Patent without evidence of  aneurysm, dissection, vasculitis or significant stenosis. Inflow: Patent without evidence of aneurysm, dissection, vasculitis or significant stenosis. Veins: No obvious venous abnormality within the limitations of this arterial phase study. Review of the MIP images confirms the above findings. NON-VASCULAR Hepatobiliary: No focal abnormality on this arterial phase exam. Gallbladder physiologically distended, no calcified stone. No biliary dilatation. Pancreas: Improved peripancreatic edema from prior exam. No definite residual pancreatic inflammation. No pancreatic ductal dilatation. No peripancreatic collection. Spleen: Normal in size and arterial enhancement. Adrenals/Urinary Tract: No adrenal nodule. Again seen extrarenal pelvis configuration of the left kidney. No hydronephrosis. Again seen diffuse bladder wall thickening. Stomach/Bowel: Small hiatal hernia. Colonic diverticulosis without diverticulitis. No evidence of bowel inflammation or obstruction. The appendix is not definitively seen. There is no contrast accumulating in the GI tract to suggest GI bleed. Lymphatic: No enlarged lymph nodes in the abdomen or pelvis. Reproductive: Unremarkable prostate. Other: Diminished free fluid in the pelvis from recent CT. Moderate fat containing left inguinal hernia. Small fat containing umbilical hernia. Musculoskeletal: Mild scoliosis and degenerative change in the spine. Review of the MIP images confirms the above findings. IMPRESSION: 1. No aortic  dissection or acute aortic abnormality. Mildly aneurysmal ascending thoracic aorta at 4.1 cm. Recommend annual imaging follow-up by CTA or MRA. 2. No evidence of GI bleed. 3. Questionable soft tissue thickening about the hepatic artery which may be secondary to vasculitis/inflammation. 4. 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 Consider IR consultation. 5. Improved peripancreatic edema from prior exam. No definite residual pancreatic inflammation. 6. Colonic diverticulosis without diverticulitis. 7. Diffuse bladder wall thickening, also present on prior. This may be due to chronic bladder outlet obstruction or cystitis. 8. Left lower lobe pulmonary nodules measuring up to 6-7 mm, unchanged over serial exams. No specific imaging follow-up is needed. 9. Small hiatal hernia. 10. Fat containing left inguinal and umbilical hernias. Electronically Signed   By: Andrea Gasman M.D.   On: 05/16/2024 17:03   DG Chest Portable 1 View Result Date: 05/16/2024 CLINICAL DATA:  Acute epigastric abdominal pain. EXAM: PORTABLE CHEST 1 VIEW COMPARISON:  May 07, 2024. FINDINGS: The heart size and mediastinal contours are within normal limits. Minimal bibasilar subsegmental atelectasis is noted. The visualized skeletal structures are unremarkable. IMPRESSION: Minimal bibasilar subsegmental atelectasis. Electronically Signed   By: Lynwood Landy Raddle M.D.   On: 05/16/2024 16:04     Discharge Instructions:   Discharge Instructions      To BRASEN BUNDREN or their caretakers,  You were recently admitted to [facility] for GI bleeding.  We think your bleeding was from diverticula or outpouchings of your colon.  We did do an endoscopy to evaluate your stomach and upper GI tract which showed inflammation (gastritis).  We have also taken some biopsies of your stomach to evaluate for infection called H. pylori.  Please stop taking all medications in the  NSAID (Non-Steroidal Anti-Inflammatory Drug) class including ibuprofen  (Aleve, Motrin ), meloxicam .  These medications can worsen your stomach inflammation.  As for your hyponatremia or low sodium, please continue restricting fluids and taking salt tablets with meals.  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  You also need to follow up with GI for additional testing.  Continue taking your home medications with the following changes:  Start taking Pantoprazole  (Protonix ) 40 mg twice daily for 1 month, then 40 mg daily afterwards. Stop taking  Ibuprofen  (Advil ) Meloxicam  (Mobic ) Omeprazole  (Prilosec)  You should seek further medical care if you experience additional bleeding with your stools, severe fatigue, confusion, shortness of breath.  Please follow up with the following doctors/specialties: Gastroenterology (GI) - you have an appointment with Harlene Mail, PA-C with GI on 06/15/24 at 10:20am. Interventional Radiology (IR) - please call Dr. Eliazar office to schedule an appointment for evaluation of your gastric artery aneurysm.  We have also scheduled you an appointment at our internal medicine clinic [facility]) with Dr. Ozell Nearing on 05/30/2024 at 9:15am. We are so glad that you are feeling better.  Sincerely,  [name]      Signed:  Letha Cheadle, MD Internal Medicine Resident, PGY-1 05/18/2024, 1:41 PM Please contact the on call pager after 5 pm and on weekends at [phone].

## 2024-05-18 NOTE — Telephone Encounter (Signed)
 US  order has been entered  and sent to the schedulers   Appt made to see Edward [name] on 06/15/24 at 1020 am

## 2024-05-18 NOTE — Plan of Care (Signed)
  Problem: Education: Goal: Knowledge of General Education information will improve Description: Including pain rating scale, medication(s)/side effects and non-pharmacologic comfort measures 05/18/2024 0542 by Honora Frederick PARAS, RN Outcome: Progressing 05/18/2024 0542 by Honora Frederick PARAS, RN Outcome: Progressing   Problem: Health Behavior/Discharge Planning: Goal: Ability to manage health-related needs will improve 05/18/2024 0542 by Honora Frederick PARAS, RN Outcome: Progressing 05/18/2024 0542 by Honora Frederick PARAS, RN Outcome: Progressing   Problem: Clinical Measurements: Goal: Ability to maintain clinical measurements within normal limits will improve 05/18/2024 0542 by Honora Frederick PARAS, RN Outcome: Progressing 05/18/2024 0542 by Honora Frederick PARAS, RN Outcome: Progressing Goal: Will remain free from infection 05/18/2024 0542 by Honora Frederick PARAS, RN Outcome: Progressing 05/18/2024 0542 by Honora Frederick PARAS, RN Outcome: Progressing Goal: Diagnostic test results will improve 05/18/2024 0542 by Honora Frederick PARAS, RN Outcome: Progressing 05/18/2024 0542 by Honora Frederick PARAS, RN Outcome: Progressing Goal: Respiratory complications will improve 05/18/2024 0542 by Honora Frederick PARAS, RN Outcome: Progressing 05/18/2024 0542 by Honora Frederick PARAS, RN Outcome: Progressing Goal: Cardiovascular complication will be avoided 05/18/2024 0542 by Honora Frederick PARAS, RN Outcome: Progressing 05/18/2024 0542 by Honora Frederick PARAS, RN Outcome: Progressing   Problem: Activity: Goal: Risk for activity intolerance will decrease 05/18/2024 0542 by Honora Frederick PARAS, RN Outcome: Progressing 05/18/2024 0542 by Honora Frederick PARAS, RN Outcome: Progressing   Problem: Nutrition: Goal: Adequate nutrition will be maintained 05/18/2024 0542 by Honora Frederick PARAS, RN Outcome: Progressing 05/18/2024 0542 by Honora Frederick PARAS, RN Outcome: Progressing   Problem: Coping: Goal: Level of anxiety will  decrease 05/18/2024 0542 by Honora Frederick PARAS, RN Outcome: Progressing 05/18/2024 0542 by Honora Frederick PARAS, RN Outcome: Progressing   Problem: Elimination: Goal: Will not experience complications related to bowel motility 05/18/2024 0542 by Honora Frederick PARAS, RN Outcome: Progressing 05/18/2024 0542 by Honora Frederick PARAS, RN Outcome: Progressing Goal: Will not experience complications related to urinary retention 05/18/2024 0542 by Honora Frederick PARAS, RN Outcome: Progressing 05/18/2024 0542 by Honora Frederick PARAS, RN Outcome: Progressing   Problem: Pain Managment: Goal: General experience of comfort will improve and/or be controlled 05/18/2024 0542 by Honora Frederick PARAS, RN Outcome: Progressing 05/18/2024 0542 by Honora Frederick PARAS, RN Outcome: Progressing   Problem: Safety: Goal: Ability to remain free from injury will improve 05/18/2024 0542 by Honora Frederick PARAS, RN Outcome: Progressing 05/18/2024 0542 by Honora Frederick PARAS, RN Outcome: Progressing   Problem: Skin Integrity: Goal: Risk for impaired skin integrity will decrease 05/18/2024 0542 by Honora Frederick PARAS, RN Outcome: Progressing 05/18/2024 0542 by Honora Frederick PARAS, RN Outcome: Progressing

## 2024-05-18 NOTE — Discharge Instructions (Addendum)
 To [name] or their caretakers,  You were recently admitted to [facility] for GI bleeding.  We think your bleeding was from diverticula or outpouchings of your colon.  We did do an endoscopy to evaluate your stomach and upper GI tract which showed inflammation (gastritis).  We have also taken some biopsies of your stomach to evaluate for infection called H. pylori.  Please stop taking all medications in the NSAID (Non-Steroidal Anti-Inflammatory Drug) class including ibuprofen  (Aleve, Motrin ), meloxicam .  These medications can worsen your stomach inflammation.  As for your hyponatremia or low sodium, please continue restricting fluids and taking salt tablets with meals.  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  You also need to follow up with GI for additional testing.  Continue taking your home medications with the following changes:  Start taking Pantoprazole  (Protonix ) 40 mg twice daily for 1 month, then 40 mg daily afterwards. Stop taking Ibuprofen  (Advil ) Meloxicam  (Mobic ) Omeprazole  (Prilosec)  You should seek further medical care if you experience additional bleeding with your stools, severe fatigue, confusion, shortness of breath.  Please follow up with the following doctors/specialties: Gastroenterology (GI) - you have an appointment with Harlene Mail, PA-C with GI on 06/15/24 at 10:20am. Interventional Radiology (IR) - please call Dr. Eliazar office to schedule an appointment for evaluation of your gastric artery aneurysm.  We have also scheduled you an appointment at our internal medicine clinic [facility]) with Dr. Ozell Nearing on 05/30/2024 at 9:15am. We are so glad that you are feeling better.  Sincerely,  [name]

## 2024-05-18 NOTE — Progress Notes (Signed)
 DISCHARGE NOTE HOME [place] to be discharged Home per MD order. Discussed prescriptions and follow up appointments with the patient. Prescriptions given to patient; medication list explained in detail. Patient verbalized understanding.  Skin clean, dry and intact without evidence of skin break down, no evidence of skin tears noted. IV catheter discontinued intact. Site without signs and symptoms of complications. Dressing and pressure applied. Pt denies pain at the site currently. No complaints noted.  Patient free of lines, drains, and wounds.   An After Visit Summary (AVS) was printed and given to the patient. Patient escorted via wheelchair, and discharged home via private auto.  Peyton SHAUNNA Pepper, RN

## 2024-05-18 NOTE — Telephone Encounter (Signed)
-----   Message from [facility] sent at 05/18/2024 11:14 AM EDT ----- Regarding: RE: Outpatient follow up SEC, Thanks. Sounds good. Agree with plan of action.  Team C Rns, Also please set up followup with APP in the next 4-weeks. Thanks. GM ----- Message ----- From: Stacia Glendia BRAVO, MD Sent: 05/18/2024  11:13 AM EDT To: Aloha Wilhelmenia Raddle., MD; [facility] Triage Subject: Outpatient follow up                           GM,  You previously did this patient's colonoscopy.  He was admitted recently with mild pancreatitis of unclear etiology.  I saw him this admission for GI bleeding.  CTA negative.  Suspect diverticular, but we did upper endoscopy to exclude peptic ulcer disease, as the patient was taking multiple NSAIDs. EGD with mild gastritis of the body, otherwise unremarkable.  He continues to have epigastric pain.  Was going to get ultrasound, but patient really wanted to go home.  Would recommend outpatient ultrasound and follow up with you based on results.  Team,  Can you please order a RUQUS under Dr. Melba name to evaluate epigastric pain, history of pancreatitis?  Thanks

## 2024-05-19 NOTE — Anesthesia Postprocedure Evaluation (Signed)
 Anesthesia Post Note  Patient: Edward [name]  Procedure(s) Performed: EGD (ESOPHAGOGASTRODUODENOSCOPY) BIOPSY, SKIN, SUBCUTANEOUS TISSUE, OR MUCOUS MEMBRANE     Patient location during evaluation: PACU Anesthesia Type: MAC Level of consciousness: awake and alert Pain management: pain level controlled Vital Signs Assessment: post-procedure vital signs reviewed and stable Respiratory status: spontaneous breathing, nonlabored ventilation, respiratory function stable and patient connected to nasal cannula oxygen Cardiovascular status: stable and blood pressure returned to baseline Postop Assessment: no apparent nausea or vomiting Anesthetic complications: no   No notable events documented.  Last Vitals:  Vitals:   05/18/24 1029 05/18/24 1149  BP: (!) 131/96 (!) 126/107  Pulse: 89 97  Resp: 20   Temp: 36.7 C 36.6 C  SpO2: 99% 100%    Last Pain:  Vitals:   05/18/24 1149  TempSrc: Oral  PainSc:                  Cordella SQUIBB Harrol Novello

## 2024-05-21 ENCOUNTER — Telehealth: Payer: Self-pay

## 2024-05-21 ENCOUNTER — Encounter (HOSPITAL_COMMUNITY): Payer: Self-pay | Admitting: Gastroenterology

## 2024-05-21 LAB — SURGICAL PATHOLOGY

## 2024-05-21 NOTE — Transitions of Care (Post Inpatient/ED Visit) (Signed)
   05/21/2024  Name: Edward [name] MRN: [id number] DOB: 1950-12-10  Today's TOC FU Call Status: Today's TOC FU Call Status:: Unsuccessful Call (1st Attempt) Unsuccessful Call (1st Attempt) Date: 05/21/24  Attempted to reach the patient regarding the most recent Inpatient/ED visit.  Follow Up Plan: Additional outreach attempts will be made to reach the patient to complete the Transitions of Care (Post Inpatient/ED visit) call.   Medford Balboa, BSN, RN [place]  VBCI - [facility] RN Care Manager [phone]

## 2024-05-21 NOTE — Telephone Encounter (Signed)
 Per request message routed to PA.  Copied from CRM #[id number]. Topic: Complaint (DO NOT CONVERT) - Sensitive >> May 18, 2024  2:50 PM Alfonso ORN wrote: Date of Incident: N/A do not remember the date  Details of complaint: patient has a concern when try message provider in [place] the provider he  last seen  the provider never responded it was regarding a medication , found out the provider was no longer there may have been a resident doctor  How would the patient like to see it resolved? when have a question or medical advice want to be able to communicate to nurse or provider , On a scale of 1-10, how was your experience? 5 What would it take to bring it to a 10?  have someone appointed to be able to answer the questions at all time since it is a resident run clinic     Route to Research officer, political party.

## 2024-05-21 NOTE — Transitions of Care (Post Inpatient/ED Visit) (Signed)
 05/21/2024  Name: Edward [name] MRN: [id number] DOB: 04-12-1951  Today's TOC FU Call Status: Today's TOC FU Call Status:: Successful TOC FU Call Completed Unsuccessful Call (1st Attempt) Date: 05/21/24 Patient's Name and Date of Birth confirmed.  Transition Care Management Follow-up Telephone Call Date of Discharge: 05/18/24 Discharge Facility: [name]) Type of Discharge: Inpatient Admission Primary Inpatient Discharge Diagnosis:: Gastric Bleeding How have you been since you were released from the hospital?: Better Any questions or concerns?: No  Items Reviewed: Did you receive and understand the discharge instructions provided?: Yes Medications obtained,verified, and reconciled?: Yes (Medications Reviewed) Any new allergies since your discharge?: No Dietary orders reviewed?: Yes Type of Diet Ordered:: Low Sodium Heart Healthy Do you have support at home?: Yes People in Home [RPT]: spouse Name of Support/Comfort Primary Source: Edward [name]  Medications Reviewed Today: Medications Reviewed Today     Reviewed by Moises Reusing, RN (Case Manager) on 05/21/24 at 1054  Med List Status: <None>   Medication Order Taking? Sig Documenting Provider Last Dose Status Informant  acetaminophen  (TYLENOL ) 650 MG CR tablet [id number] No Take 1,300 mg by mouth every 8 (eight) hours as needed for pain. [provider] 05/15/2024 Evening Active Self, Pharmacy Records  albuterol  (VENTOLIN  HFA) 108 (90 Base) MCG/ACT inhaler [id number] No Inhale 2 puffs into the lungs every 4 (four) hours as needed for wheezing or shortness of breath.  Patient not taking: Reported on 05/08/2024   Raford Alm, MD Not Taking Active Self, Pharmacy Records           Med Note LEOBARDO, NICOLE   Wed May 16, 2024  7:19 PM) Medication has not been input by ED physician.   aspirin  EC 81 MG tablet [id number] No Take 81 mg by mouth at bedtime. Swallow whole. [provider] 05/15/2024 Bedtime Active Self,  Pharmacy Records  Coenzyme Q10 (COQ-10) 100 MG CAPS [id number] No Take 1 capsule by mouth in the morning and at bedtime. [provider] 05/15/2024 Bedtime Active Self, Pharmacy Records  finasteride  (PROSCAR ) 5 MG tablet [id number] No TAKE 1 TABLET (5 MG TOTAL) BY MOUTH DAILY. Arellano Zameza, Priscila, MD 05/15/2024 Morning Active Self, Pharmacy Records  ibuprofen  (ADVIL ) 200 MG tablet [id number] No Take 400 mg by mouth every 6 (six) hours as needed for mild pain (pain score 1-3). [provider] 05/15/2024 Noon Active Self, Pharmacy Records  meloxicam  (MOBIC ) 15 MG tablet [id number] No Take 1 tablet (15 mg total) by mouth daily as needed for pain.  Patient not taking: Reported on 05/16/2024   Edgardo Pontiff, DO Not Taking Active Self, Pharmacy Records  metoprolol  succinate (TOPROL -XL) 25 MG 24 hr tablet [id number] No TAKE 1 TABLET (25 MG) BY MOUTH DAILY. APPOINTMENT NEEDED FOR CONTINUATION OF REFILLS-1ST ATTEMPT  Patient taking differently: Take 25 mg by mouth at bedtime. Take 1 tablet (25 mg) by mouth at bedtime.   Shlomo Wilbert SAUNDERS, MD 05/15/2024  9:30 PM Active Self, Pharmacy Records  Multiple Vitamin (MULTIVITAMIN WITH MINERALS) TABS tablet [id number] No Take 1 tablet by mouth daily. [provider] 05/15/2024 Morning Active Self, Pharmacy Records  N-ACETYL CYSTEINE 600 MG TABS [id number] No Take 1 tablet by mouth at bedtime. [provider] 05/15/2024 Bedtime Active Self, Pharmacy Records  OVER THE COUNTER MEDICATION [id number] No Take 1 capsule by mouth at bedtime. PQQ: Pyrroloquinoline Quinone Take one capsule by mouth daily [provider] 05/15/2024 Bedtime Active Self, Pharmacy Records  pantoprazole  (PROTONIX ) 40 MG tablet [id number]  Take 1 tablet (40  mg total) by mouth 2 (two) times daily. Waymond Cart, MD  Active   RA CRANBERRY 500 MG CAPS [id number] No Take 1 capsule by mouth in the morning and at bedtime. [provider] 05/15/2024 Bedtime Active Self,  Pharmacy Records  rosuvastatin  (CRESTOR ) 10 MG tablet [id number] No TAKE 1 TABLET BY MOUTH EVERY DAY  Patient taking differently: Take 10 mg by mouth 4 (four) times a week. Take one tablet (10mg ) by mouth at bedtime.   Arellano Zameza, Priscila, MD 05/14/2024 Active Self, Pharmacy Records  sodium chloride  1 g tablet [id number] No Take 2 tablets (2 g total) by mouth 3 (three) times daily with meals. Waymond Cart, MD 05/15/2024 Bedtime Active Self, Pharmacy Records  spironolactone  (ALDACTONE ) 25 MG tablet [id number] No Take 0.5 tablets (12.5 mg total) by mouth daily.  Patient not taking: Reported on 05/16/2024   Shlomo Wilbert SAUNDERS, MD Not Taking Active Self, Pharmacy Records  tamsulosin  (FLOMAX ) 0.4 MG CAPS capsule [id number] No TAKE 1 CAPSULE BY MOUTH EVERY DAY  Patient taking differently: Take 0.4 mg by mouth at bedtime.   Arellano Zameza, Priscila, MD 05/15/2024 Bedtime Active Self, Pharmacy Records            Home Care and Equipment/Supplies: Were Home Health Services Ordered?: NA Any new equipment or medical supplies ordered?: NA  Functional Questionnaire: Do you need assistance with bathing/showering or dressing?: No Do you need assistance with meal preparation?: No Do you need assistance with eating?: No Do you have difficulty maintaining continence: No Do you need assistance with getting out of bed/getting out of a chair/moving?: No Do you have difficulty managing or taking your medications?: No  Follow up appointments reviewed: PCP Follow-up appointment confirmed?: Yes Date of PCP follow-up appointment?: 05/30/24 Follow-up Provider: Dr. Elicia Specialist [facility] Follow-up appointment confirmed?: Yes Date of Specialist follow-up appointment?: 06/15/24 Follow-Up Specialty Provider:: Dr. Zehr Do you need transportation to your follow-up appointment?: No Do you understand care options if your condition(s) worsen?: Yes-patient verbalized understanding  SDOH Interventions Today     Flowsheet Row Most Recent Value  SDOH Interventions   Food Insecurity Interventions Intervention Not Indicated  Housing Interventions Intervention Not Indicated  Transportation Interventions Intervention Not Indicated  Utilities Interventions Intervention Not Indicated    Medford Balboa, BSN, RN [place]  VBCI - [facility] RN Care Manager [phone]

## 2024-05-23 ENCOUNTER — Ambulatory Visit
Admission: RE | Admit: 2024-05-23 | Discharge: 2024-05-23 | Disposition: A | Discharge status: Home / Self Care | Source: Ambulatory Visit | Attending: Student | Admitting: Student

## 2024-05-23 DIAGNOSIS — I728 Aneurysm of other specified arteries: Secondary | ICD-10-CM

## 2024-05-23 HISTORY — PX: IR RADIOLOGIST EVAL & MGMT: IMG5224

## 2024-05-24 NOTE — Consult Note (Signed)
 Chief Complaint: Mesenteric aneurysm  Referring Provider(s): Matthews,Kacie Sue-Ellen     Patient Status: [facility] outpatient  History of Present Illness: Edward [name] is a 73 y.o. male was recently admitted to the hospital with a GI bleed. 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 The left gastric artery has a separate origin directly from the aorta and appears to have suffered trauma with a dissection between the exam performed on 05/16/2024 and the previous study from 08/08/2019 both of which were reviewed and discussed with the patient. Given the overall caliber of the vessel, it would likely be challenging to access this artery and embolized the aneurysm and although the aneurysm is small its relationship in size to the vessel is significant. The plan discussed with the patient today was to perform an aortic angiogram in order to better evaluate flow dynamics of this vessel and potential methods of embolization whether tracking retrograde through the right gastric or potentially accessing directly into the left gastric from the aorta. The procedure, risks, benefits, and alternatives discussed in detail with the patient. If we are unable to access this aneurysm either antegrade or retrograde patient may need to be followed surgically. Patient had numerous questions all of which were answered to his satisfaction. The procedure would be scheduled at the interventional suite in the hospital and be performed under moderate sedation. The patient is in agreement with this plan.     Past Medical History:  Diagnosis Date   Abnormal EKG 05/29/2018   Sinus bradycardia and LAFB with TWI V4-V6, III and aVF   Benign essential HTN 05/29/2018   Cardiomyopathy (HCC)    a. dx by echo 07/2020.   Dilation of aorta    Environmental allergies    Fracture, ribs    H/O:  varicose veins    Hearing loss    Hyperlipidemia LDL goal <70    Multiple food allergies    Rash    Seasonal allergies    Sinusitis    Stroke [facility])    s/p tpa    Past Surgical History:  Procedure Laterality Date   BIOPSY OF SKIN SUBCUTANEOUS TISSUE AND/OR MUCOUS MEMBRANE  05/18/2024   Procedure: BIOPSY, SKIN, SUBCUTANEOUS TISSUE, OR MUCOUS MEMBRANE;  Surgeon: Stacia Glendia BRAVO, MD;  Location: [facility] ENDOSCOPY;  Service: Gastroenterology;;   COLONOSCOPY     ESOPHAGOGASTRODUODENOSCOPY N/A 05/18/2024   Procedure: EGD (ESOPHAGOGASTRODUODENOSCOPY);  Surgeon: Stacia Glendia BRAVO, MD;  Location: [facility] ENDOSCOPY;  Service: Gastroenterology;  Laterality: N/A;   INGUINAL HERNIA REPAIR Left    right hand surgery      Allergies: Patient has no known allergies.  Medications: Prior to Admission medications   Medication Sig Start Date End Date Taking? Authorizing Provider  acetaminophen  (TYLENOL ) 650 MG CR tablet Take 1,300 mg by mouth every 8 (eight) hours as needed for pain.    [provider]  albuterol  (VENTOLIN  HFA) 108 (90 Base) MCG/ACT inhaler Inhale 2 puffs into the lungs every 4 (four) hours as needed for wheezing or shortness of breath. Patient not taking: Reported on 05/08/2024 05/06/24   Raford Alm, MD  aspirin  EC 81 MG tablet Take 81 mg by mouth at bedtime. Swallow whole.    [provider]  Coenzyme Q10 (COQ-10) 100 MG CAPS Take 1 capsule by mouth in the morning and at bedtime. 11/02/21   [provider]  finasteride  (PROSCAR ) 5  MG tablet TAKE 1 TABLET (5 MG TOTAL) BY MOUTH DAILY. 01/09/24 01/08/25  Arellano Zameza, Priscila, MD  ibuprofen  (ADVIL ) 200 MG tablet Take 400 mg by mouth every 6 (six) hours as needed for mild pain (pain score 1-3).    [provider]  meloxicam  (MOBIC ) 15 MG tablet Take 1 tablet (15 mg total) by mouth daily as needed for pain. Patient not taking: Reported on 05/16/2024 04/19/24   Syeda, Raeeha, DO  metoprolol  succinate (TOPROL -XL)  25 MG 24 hr tablet TAKE 1 TABLET (25 MG) BY MOUTH DAILY. APPOINTMENT NEEDED FOR CONTINUATION OF REFILLS-1ST ATTEMPT Patient taking differently: Take 25 mg by mouth at bedtime. Take 1 tablet (25 mg) by mouth at bedtime. 01/24/24   Shlomo Wilbert SAUNDERS, MD  Multiple Vitamin (MULTIVITAMIN WITH MINERALS) TABS tablet Take 1 tablet by mouth daily.    [provider]  N-ACETYL CYSTEINE 600 MG TABS Take 1 tablet by mouth at bedtime. 11/02/21   [provider]  OVER THE COUNTER MEDICATION Take 1 capsule by mouth at bedtime. PQQ: Pyrroloquinoline Quinone Take one capsule by mouth daily    [provider]  pantoprazole  (PROTONIX ) 40 MG tablet Take 1 tablet (40 mg total) by mouth 2 (two) times daily. 05/18/24 06/17/24  Waymond Cart, MD  RA CRANBERRY 500 MG CAPS Take 1 capsule by mouth in the morning and at bedtime. 11/02/21   [provider]  rosuvastatin  (CRESTOR ) 10 MG tablet TAKE 1 TABLET BY MOUTH EVERY DAY Patient taking differently: Take 10 mg by mouth 4 (four) times a week. Take one tablet (10mg ) by mouth at bedtime. 12/20/23   Arellano Zameza, Priscila, MD  sodium chloride  1 g tablet Take 2 tablets (2 g total) by mouth 3 (three) times daily with meals. 05/12/24   Waymond Cart, MD  spironolactone  (ALDACTONE ) 25 MG tablet Take 0.5 tablets (12.5 mg total) by mouth daily. Patient not taking: Reported on 05/16/2024 01/27/24   Shlomo Wilbert SAUNDERS, MD  tamsulosin  (FLOMAX ) 0.4 MG CAPS capsule TAKE 1 CAPSULE BY MOUTH EVERY DAY Patient taking differently: Take 0.4 mg by mouth at bedtime. 01/24/24   Arellano Zameza, Priscila, MD     Family History  Problem Relation Age of Onset   CVA Mother    Heart failure Father        CHF   Brain cancer Brother        GBM   Colon polyps Neg Hx    Colon cancer Neg Hx    Esophageal cancer Neg Hx    Rectal cancer Neg Hx    Stomach cancer Neg Hx     Social History   Socioeconomic History   Marital status: Married    Spouse name: Not on file   Number  of children: Not on file   Years of education: Not on file   Highest education level: Not on file  Occupational History   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  Tobacco Use   Smoking status: Former   Smokeless tobacco: Never   Tobacco comments:    quit 40+ years ago  Vaping Use   Vaping status: Never Used  Substance and Sexual Activity   Alcohol use: Yes    Comment: wine 3 x a week   Drug use: Never   Sexual activity: Yes    Partners: Female  Other Topics Concern   Not on file  Social History Narrative   Not on file   Social Drivers of Health   Financial  Resource Strain: Low Risk  (01/19/2023)   Overall Financial Resource Strain (CARDIA)    Difficulty of Paying Living Expenses: Not hard at all  Food Insecurity: No Food Insecurity (05/21/2024)   Hunger Vital Sign    Worried About Running Out of Food in the Last Year: Never true    Ran Out of Food in the Last Year: Never true  Transportation Needs: No Transportation Needs (05/21/2024)   PRAPARE - Administrator, Civil Service (Medical): No    Lack of Transportation (Non-Medical): No  Physical Activity: Sufficiently Active (01/19/2023)   Exercise Vital Sign    Days of Exercise per Week: 7 days    Minutes of Exercise per Session: 30 min  Stress: No Stress Concern Present (01/19/2023)   [employer] of Occupational Health - Occupational Stress Questionnaire    Feeling of Stress : Not at all  Social Connections: Socially Integrated (05/16/2024)   Social Connection and Isolation Panel    Frequency of Communication with Friends and Family: More than three times a week    Frequency of Social Gatherings with Friends and Family: More than three times a week    Attends Religious Services: More than 4 times per year    Active Member of [employer] or Organizations: Yes    Attends Engineer, structural: More than 4 times per year    Marital Status: Married     Review of Systems: A 12 point ROS discussed  and pertinent positives are indicated in the HPI above.  All other systems are negative.  Review of Systems  Respiratory:  Positive for cough and shortness of breath.   All other systems reviewed and are negative.   Vital Signs: BP 131/78 (BP Location: Left Arm, Patient Position: Sitting, Cuff Size: Normal)   Pulse 80   Temp 97.8 F (36.6 C) (Oral)   Resp (!) 24   SpO2 94% Comment: Shortness of breath noted from lobby to consult room (walking), pt stated the shortness of breath has been present x about 1 week, he thinks maybe it is allergies, Dr. Jenna made aware     Physical Exam Constitutional:      Appearance: Normal appearance.  HENT:     Head: Normocephalic.  Cardiovascular:     Rate and Rhythm: Normal rate and regular rhythm.  Pulmonary:     Effort: Pulmonary effort is normal.     Breath sounds: Normal breath sounds.  Skin:    General: Skin is warm and dry.  Neurological:     Mental Status: He is alert.  Psychiatric:        Mood and Affect: Mood normal.        Thought Content: Thought content normal.     Imaging: CT Angio Chest/Abd/Pel for Dissection W and/or W/WO Result Date: 05/16/2024 CLINICAL DATA:  Provided history: Acute aortic syndrome (AAS) suspected Technologist notes state abdominal pain.  Rectal bleeding. EXAM: CT ANGIOGRAPHY CHEST, ABDOMEN AND PELVIS TECHNIQUE: Non-contrast CT of the chest was initially obtained. Multidetector CT imaging through the chest, abdomen and pelvis was performed using the standard protocol during bolus administration of intravenous contrast. Multiplanar reconstructed images and MIPs were obtained and reviewed to evaluate the vascular anatomy. RADIATION DOSE REDUCTION: This exam was performed according to the departmental dose-optimization program which includes automated exposure control, adjustment of the mA and/or kV according to patient size and/or use of iterative reconstruction technique. CONTRAST:  75mL OMNIPAQUE  IOHEXOL  350  MG/ML SOLN COMPARISON:  Abdominopelvic CT 05/07/2024,  chest CT 12/21/2022, abdominal CTA 08/08/2019 FINDINGS: CTA CHEST FINDINGS Cardiovascular: No aortic hematoma on unenhanced exam. Mildly aneurysmal ascending aorta at 4.1 cm. No dissection or acute aortic findings. No pulmonary embolus to the segmental level. The heart is borderline enlarged. No pericardial effusion. Mediastinum/Nodes: No mediastinal or hilar adenopathy. The esophagus is decompressed, tiny hiatal hernia. No thyroid  nodule. Lungs/Pleura: Elevated right hemidiaphragm. There is increasing atelectasis at the right lung base from recent abdominopelvic CT. 6-7 mm left lower lobe pulmonary nodule, series 7, image 120, unchanged over serial exams. Additional punctate left lower lobe nodule, series 7, image 94, is also unchanged. Again seen mild biapical pleuroparenchymal scarring. No features of pulmonary edema. No pleural effusion. Trachea and central airways are clear. Musculoskeletal: Right glenohumeral arthropathy. Mild degenerative change in the thoracic spine. There are no acute or suspicious osseous abnormalities. Review of the MIP images confirms the above findings. CTA ABDOMEN AND PELVIS FINDINGS VASCULAR Aorta: Normal caliber aorta without aneurysm, dissection, vasculitis or significant stenosis. Celiac: Separate origin of the hepatic and splenic arteries from the upper abdominal aorta. Questionable soft tissue thickening about the hepatic artery, reference series 9, image 74. An additional tiny artery arising from the abdominal aorta above the hepatic and celiac arteries, may be a small gastric artery, demonstrates 7 mm aneurysm, series 6, image 133. No adjacent stranding. SMA: Patent without evidence of aneurysm, dissection, vasculitis or significant stenosis. Renals: Both renal arteries are patent without evidence of aneurysm, dissection, vasculitis, fibromuscular dysplasia or significant stenosis. IMA: Patent without evidence of aneurysm,  dissection, vasculitis or significant stenosis. Inflow: Patent without evidence of aneurysm, dissection, vasculitis or significant stenosis. Veins: No obvious venous abnormality within the limitations of this arterial phase study. Review of the MIP images confirms the above findings. NON-VASCULAR Hepatobiliary: No focal abnormality on this arterial phase exam. Gallbladder physiologically distended, no calcified stone. No biliary dilatation. Pancreas: Improved peripancreatic edema from prior exam. No definite residual pancreatic inflammation. No pancreatic ductal dilatation. No peripancreatic collection. Spleen: Normal in size and arterial enhancement. Adrenals/Urinary Tract: No adrenal nodule. Again seen extrarenal pelvis configuration of the left kidney. No hydronephrosis. Again seen diffuse bladder wall thickening. Stomach/Bowel: Small hiatal hernia. Colonic diverticulosis without diverticulitis. No evidence of bowel inflammation or obstruction. The appendix is not definitively seen. There is no contrast accumulating in the GI tract to suggest GI bleed. Lymphatic: No enlarged lymph nodes in the abdomen or pelvis. Reproductive: Unremarkable prostate. Other: Diminished free fluid in the pelvis from recent CT. Moderate fat containing left inguinal hernia. Small fat containing umbilical hernia. Musculoskeletal: Mild scoliosis and degenerative change in the spine. Review of the MIP images confirms the above findings. IMPRESSION: 1. No aortic dissection or acute aortic abnormality. Mildly aneurysmal ascending thoracic aorta at 4.1 cm. Recommend annual imaging follow-up by CTA or MRA. 2. No evidence of GI bleed. 3. Questionable soft tissue thickening about the hepatic artery which may be secondary to vasculitis/inflammation. 4. 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 Consider IR consultation. 5. Improved peripancreatic edema from prior exam. No definite  residual pancreatic inflammation. 6. Colonic diverticulosis without diverticulitis. 7. Diffuse bladder wall thickening, also present on prior. This may be due to chronic bladder outlet obstruction or cystitis. 8. Left lower lobe pulmonary nodules measuring up to 6-7 mm, unchanged over serial exams. No specific imaging follow-up is needed. 9. Small hiatal hernia. 10. Fat containing left inguinal and umbilical hernias. Electronically Signed  By: Andrea Gasman M.D.   On: 05/16/2024 17:03   DG Chest Portable 1 View Result Date: 05/16/2024 CLINICAL DATA:  Acute epigastric abdominal pain. EXAM: PORTABLE CHEST 1 VIEW COMPARISON:  May 07, 2024. FINDINGS: The heart size and mediastinal contours are within normal limits. Minimal bibasilar subsegmental atelectasis is noted. The visualized skeletal structures are unremarkable. IMPRESSION: Minimal bibasilar subsegmental atelectasis. Electronically Signed   By: Lynwood Landy Raddle M.D.   On: 05/16/2024 16:04   CT ABDOMEN PELVIS W CONTRAST Result Date: 05/07/2024 CLINICAL DATA:  Epigastric pain EXAM: CT ABDOMEN AND PELVIS WITH CONTRAST TECHNIQUE: Multidetector CT imaging of the abdomen and pelvis was performed using the standard protocol following bolus administration of intravenous contrast. RADIATION DOSE REDUCTION: This exam was performed according to the departmental dose-optimization program which includes automated exposure control, adjustment of the mA and/or kV according to patient size and/or use of iterative reconstruction technique. CONTRAST:  75mL OMNIPAQUE  IOHEXOL  350 MG/ML SOLN COMPARISON:  12/21/2022, 08/08/2019 FINDINGS: Lower chest: Elevated right hemidiaphragm, with compressive atelectasis at the right lung base. No acute pleural or parenchymal lung disease. Hepatobiliary: No focal liver abnormality is seen. No gallstones, gallbladder wall thickening, or biliary dilatation. Pancreas: There is mild peripancreatic fat stranding along the dorsal margin of  the pancreatic body, reference image 27/3, consistent with mild acute uncomplicated pancreatitis. No pancreatic duct dilation. No fluid collection or pseudocyst. Spleen: Normal in size without focal abnormality. Adrenals/Urinary Tract: Bilateral subcentimeter renal cortical cysts do not require specific imaging follow-up. Otherwise the kidneys enhance normally. Extrarenal pelvis is again noted within the left kidney. No urinary tract calculi or obstructive uropathy. There is mild diffuse bladder wall thickening and trabeculations, consistent with chronic bladder outlet obstruction. No filling defects. The adrenals are unremarkable. Stomach/Bowel: No bowel obstruction or ileus. The appendix, if still present, is not well visualized. Diffuse colonic diverticulosis without diverticulitis. Small hiatal hernia. No bowel wall thickening or inflammatory change. Vascular/Lymphatic: No significant vascular findings are present. No enlarged abdominal or pelvic lymph nodes. Reproductive: Prostate is unremarkable. Other: Trace pelvic free fluid. No free intraperitoneal gas. Small fat containing umbilical hernia and moderate fat containing left inguinal hernia. No bowel herniation. Musculoskeletal: No acute or destructive bony abnormalities. Reconstructed images demonstrate no additional findings. IMPRESSION: 1. Mild peripancreatic fat stranding consistent with acute uncomplicated pancreatitis. No fluid collection, pseudocyst, or abscess. 2. Trace pelvic free fluid, likely reactive. 3. Diffuse colonic diverticulosis without diverticulitis. 4. Mild diffuse bladder wall thickening and trabeculations, most consistent with sequela of chronic bladder outlet obstruction. Electronically Signed   By: Ozell Daring M.D.   On: 05/07/2024 16:11   DG Chest 2 View Result Date: 05/07/2024 EXAM: 2 VIEW(S) XRAY OF THE CHEST 05/07/2024 02:09:00 PM COMPARISON: 05/05/2024 CLINICAL HISTORY: Chest pain. Per triage: Pt c.o epigastric pain since  Friday with some n/v. Denies diarrhea or SOB. FINDINGS: LUNGS AND PLEURA: Bibasilar atelectasis. No pulmonary edema. No pleural effusion. No pneumothorax. HEART AND MEDIASTINUM: Mild cardiomegaly. No acute abnormality of the cardiac and mediastinal silhouettes. No congestive heart failure. BONES AND SOFT TISSUES: Multilevel thoracic osteophytosis. No acute osseous abnormality. VASCULATURE: Tortuous aorta. LIMITATIONS/ARTIFACTS: Lateral view degraded by patient arm position, not raised above the head. IMPRESSION: 1. No acute findings. 2. Mild cardiomegaly without congestive heart failure. Electronically signed by: Rockey Kilts MD 05/07/2024 02:40 PM EDT RP Workstation: [id number]   DG Chest 2 View Result Date: 05/06/2024 CLINICAL DATA:  chest pressure EXAM: CHEST - 2 VIEW COMPARISON:  Chest x-ray February 08, 2018. FINDINGS:  The heart size and mediastinal contours are within normal limits. Both lungs are clear. No visible pleural effusions or pneumothorax. Remote left eighth rib fracture. No evidence of acute fracture. IMPRESSION: No active cardiopulmonary disease. Electronically Signed   By: Gilmore GORMAN Molt M.D.   On: 05/06/2024 00:06    Labs:  CBC: Recent Labs    05/11/24 0404 05/16/24 0739 05/16/24 0745 05/17/24 0158 05/18/24 0354  WBC 6.4 9.5  --  8.9 6.7  HGB 12.5* 11.5* 11.2* 9.7* 10.2*  HCT 35.0* 33.1* 33.0* 27.7* 28.4*  PLT 174 308  --  249 245    COAGS: Recent Labs    05/18/24 0354  INR 1.0    BMP: Recent Labs    05/12/24 0153 05/16/24 0739 05/16/24 0745 05/17/24 0158 05/18/24 0354  NA 130* 129* 130* 127* 129*  K 3.7 4.6 4.6 4.4 4.0  CL 96* 96* 95* 95* 97*  CO2 24 25  --  22 24  GLUCOSE 92 130* 132* 96 88  BUN 12 17 18 15 9   CALCIUM  8.0* 8.2*  --  8.0* 8.2*  CREATININE 0.98 0.94 0.90 0.78 0.85  GFRNONAA >60 >60  --  >60 >60    LIVER FUNCTION TESTS: Recent Labs    01/17/24 0847 05/07/24 1346 05/16/24 0739  BILITOT  --  1.1 1.0  AST  --  55* 27  ALT 25 63*  55*  ALKPHOS  --  46 69  PROT  --  6.5 5.7*  ALBUMIN  --  3.7 3.0*    TUMOR MARKERS: No results for input(s): AFPTM, CEA, CA199, CHROMGRNA in the last 8760 hours.  Assessment and Plan:  Mesenteric aneurysm originating from the left gastric artery.  Plan angiogram to evaluate flow and potentially embolize the aneurysm and exclude it from arterial flow.    Electronically Signed: Cordella DELENA Banner, MD   05/24/2024, 9:16 AM     I spent a total of  30 Minutes   in face to face in clinical consultation, greater than 50% of which was counseling/coordinating care for mesenteric aneurysm.

## 2024-05-27 ENCOUNTER — Ambulatory Visit (HOSPITAL_COMMUNITY)
Admission: EM | Admit: 2024-05-27 | Discharge: 2024-05-27 | Disposition: A | Discharge status: Home / Self Care | Attending: Internal Medicine | Admitting: Internal Medicine

## 2024-05-27 ENCOUNTER — Ambulatory Visit (INDEPENDENT_AMBULATORY_CARE_PROVIDER_SITE_OTHER)

## 2024-05-27 ENCOUNTER — Ambulatory Visit: Payer: Self-pay | Admitting: Gastroenterology

## 2024-05-27 ENCOUNTER — Emergency Department (HOSPITAL_BASED_OUTPATIENT_CLINIC_OR_DEPARTMENT_OTHER): Admitting: Radiology

## 2024-05-27 ENCOUNTER — Other Ambulatory Visit: Payer: Self-pay

## 2024-05-27 ENCOUNTER — Inpatient Hospital Stay (HOSPITAL_BASED_OUTPATIENT_CLINIC_OR_DEPARTMENT_OTHER)
Admission: EM | Admit: 2024-05-27 | Discharge: 2024-05-30 | DRG: 189 | Disposition: A | Discharge status: Home / Self Care | Attending: Internal Medicine | Admitting: Internal Medicine

## 2024-05-27 ENCOUNTER — Encounter (HOSPITAL_COMMUNITY): Payer: Self-pay

## 2024-05-27 ENCOUNTER — Encounter (HOSPITAL_BASED_OUTPATIENT_CLINIC_OR_DEPARTMENT_OTHER): Payer: Self-pay | Admitting: Emergency Medicine

## 2024-05-27 ENCOUNTER — Telehealth: Payer: Self-pay | Admitting: Student

## 2024-05-27 ENCOUNTER — Emergency Department (HOSPITAL_BASED_OUTPATIENT_CLINIC_OR_DEPARTMENT_OTHER)

## 2024-05-27 DIAGNOSIS — Z808 Family history of malignant neoplasm of other organs or systems: Secondary | ICD-10-CM

## 2024-05-27 DIAGNOSIS — Z8249 Family history of ischemic heart disease and other diseases of the circulatory system: Secondary | ICD-10-CM

## 2024-05-27 DIAGNOSIS — D72819 Decreased white blood cell count, unspecified: Secondary | ICD-10-CM | POA: Diagnosis present

## 2024-05-27 DIAGNOSIS — I5032 Chronic diastolic (congestive) heart failure: Secondary | ICD-10-CM | POA: Diagnosis present

## 2024-05-27 DIAGNOSIS — J9601 Acute respiratory failure with hypoxia: Secondary | ICD-10-CM | POA: Diagnosis not present

## 2024-05-27 DIAGNOSIS — N3000 Acute cystitis without hematuria: Secondary | ICD-10-CM | POA: Diagnosis present

## 2024-05-27 DIAGNOSIS — J45909 Unspecified asthma, uncomplicated: Secondary | ICD-10-CM | POA: Diagnosis present

## 2024-05-27 DIAGNOSIS — J189 Pneumonia, unspecified organism: Principal | ICD-10-CM

## 2024-05-27 DIAGNOSIS — I7121 Aneurysm of the ascending aorta, without rupture: Secondary | ICD-10-CM | POA: Diagnosis not present

## 2024-05-27 DIAGNOSIS — E785 Hyperlipidemia, unspecified: Secondary | ICD-10-CM | POA: Diagnosis present

## 2024-05-27 DIAGNOSIS — Z87891 Personal history of nicotine dependence: Secondary | ICD-10-CM

## 2024-05-27 DIAGNOSIS — Z791 Long term (current) use of non-steroidal anti-inflammatories (NSAID): Secondary | ICD-10-CM

## 2024-05-27 DIAGNOSIS — H9190 Unspecified hearing loss, unspecified ear: Secondary | ICD-10-CM | POA: Diagnosis present

## 2024-05-27 DIAGNOSIS — I429 Cardiomyopathy, unspecified: Secondary | ICD-10-CM | POA: Diagnosis present

## 2024-05-27 DIAGNOSIS — R0682 Tachypnea, not elsewhere classified: Secondary | ICD-10-CM | POA: Diagnosis not present

## 2024-05-27 DIAGNOSIS — J9811 Atelectasis: Secondary | ICD-10-CM | POA: Diagnosis present

## 2024-05-27 DIAGNOSIS — R0989 Other specified symptoms and signs involving the circulatory and respiratory systems: Secondary | ICD-10-CM | POA: Diagnosis not present

## 2024-05-27 DIAGNOSIS — Z22358 Carrier of other enterobacterales: Secondary | ICD-10-CM

## 2024-05-27 DIAGNOSIS — I509 Heart failure, unspecified: Secondary | ICD-10-CM

## 2024-05-27 DIAGNOSIS — N400 Enlarged prostate without lower urinary tract symptoms: Secondary | ICD-10-CM | POA: Diagnosis present

## 2024-05-27 DIAGNOSIS — I700 Atherosclerosis of aorta: Secondary | ICD-10-CM | POA: Diagnosis present

## 2024-05-27 DIAGNOSIS — I110 Hypertensive heart disease with heart failure: Secondary | ICD-10-CM | POA: Diagnosis present

## 2024-05-27 DIAGNOSIS — R0602 Shortness of breath: Secondary | ICD-10-CM | POA: Diagnosis not present

## 2024-05-27 DIAGNOSIS — Z823 Family history of stroke: Secondary | ICD-10-CM

## 2024-05-27 DIAGNOSIS — Z7982 Long term (current) use of aspirin: Secondary | ICD-10-CM

## 2024-05-27 DIAGNOSIS — R059 Cough, unspecified: Secondary | ICD-10-CM | POA: Diagnosis not present

## 2024-05-27 DIAGNOSIS — D500 Iron deficiency anemia secondary to blood loss (chronic): Secondary | ICD-10-CM | POA: Diagnosis present

## 2024-05-27 DIAGNOSIS — E222 Syndrome of inappropriate secretion of antidiuretic hormone: Secondary | ICD-10-CM | POA: Diagnosis present

## 2024-05-27 DIAGNOSIS — R918 Other nonspecific abnormal finding of lung field: Secondary | ICD-10-CM | POA: Diagnosis not present

## 2024-05-27 DIAGNOSIS — Z1152 Encounter for screening for COVID-19: Secondary | ICD-10-CM

## 2024-05-27 DIAGNOSIS — I2510 Atherosclerotic heart disease of native coronary artery without angina pectoris: Secondary | ICD-10-CM | POA: Diagnosis not present

## 2024-05-27 DIAGNOSIS — Z8673 Personal history of transient ischemic attack (TIA), and cerebral infarction without residual deficits: Secondary | ICD-10-CM

## 2024-05-27 DIAGNOSIS — I10 Essential (primary) hypertension: Secondary | ICD-10-CM | POA: Diagnosis present

## 2024-05-27 LAB — CBC
HCT: 34.6 % — ABNORMAL LOW (ref 39.0–52.0)
Hemoglobin: 11.7 g/dL — ABNORMAL LOW (ref 13.0–17.0)
MCH: 31.3 pg (ref 26.0–34.0)
MCHC: 33.8 g/dL (ref 30.0–36.0)
MCV: 92.5 fL (ref 80.0–100.0)
Platelets: 373 K/uL (ref 150–400)
RBC: 3.74 MIL/uL — ABNORMAL LOW (ref 4.22–5.81)
RDW: 13.2 % (ref 11.5–15.5)
WBC: 3.2 K/uL — ABNORMAL LOW (ref 4.0–10.5)
nRBC: 0 % (ref 0.0–0.2)

## 2024-05-27 LAB — BASIC METABOLIC PANEL WITH GFR
Anion gap: 14 (ref 5–15)
BUN: 9 mg/dL (ref 8–23)
CO2: 23 mmol/L (ref 22–32)
Calcium: 9.5 mg/dL (ref 8.9–10.3)
Chloride: 98 mmol/L (ref 98–111)
Creatinine, Ser: 0.96 mg/dL (ref 0.61–1.24)
GFR, Estimated: >60 mL/min (ref >60)
Glucose, Bld: 120 mg/dL — ABNORMAL HIGH (ref 70–99)
Potassium: 4.7 mmol/L (ref 3.5–5.1)
Sodium: 134 mmol/L — ABNORMAL LOW (ref 135–145)

## 2024-05-27 LAB — RESP PANEL BY RT-PCR (RSV, FLU A&B, COVID)  RVPGX2
Influenza A by PCR: NEGATIVE
Influenza B by PCR: NEGATIVE
Resp Syncytial Virus by PCR: NEGATIVE
SARS Coronavirus 2 by RT PCR: NEGATIVE

## 2024-05-27 LAB — URINALYSIS, ROUTINE W REFLEX MICROSCOPIC
Bilirubin Urine: NEGATIVE
Glucose, UA: NEGATIVE mg/dL
Hgb urine dipstick: NEGATIVE
Ketones, ur: NEGATIVE mg/dL
Nitrite: POSITIVE — AB
Protein, ur: NEGATIVE mg/dL
Specific Gravity, Urine: 1.016 (ref 1.005–1.030)
WBC, UA: >50 WBC/hpf (ref 0–5)
pH: 5.5 (ref 5.0–8.0)

## 2024-05-27 LAB — PRO BRAIN NATRIURETIC PEPTIDE: Pro Brain Natriuretic Peptide: 516 pg/mL — ABNORMAL HIGH (ref <300.0)

## 2024-05-27 LAB — TROPONIN T, HIGH SENSITIVITY: Troponin T High Sensitivity: <15 ng/L (ref 0–19)

## 2024-05-27 MED ORDER — SODIUM CHLORIDE 1 G PO TABS: 2.0000 g | ORAL_TABLET | Freq: Three times a day (TID) | ORAL | Status: DC

## 2024-05-27 MED ORDER — FINASTERIDE 5 MG PO TABS
5.0000 mg | ORAL_TABLET | Freq: Every day | ORAL | Status: DC
  Administered 2024-05-28 – 2024-05-30 (×3): 5 mg via ORAL
  Filled 2024-05-27 (×3): qty 1

## 2024-05-27 MED ORDER — IPRATROPIUM-ALBUTEROL 0.5-2.5 (3) MG/3ML IN SOLN
3.0000 mL | RESPIRATORY_TRACT | Status: DC
  Administered 2024-05-27 (×2): 3 mL via RESPIRATORY_TRACT
  Filled 2024-05-27 (×2): qty 3

## 2024-05-27 MED ORDER — IPRATROPIUM-ALBUTEROL 0.5-2.5 (3) MG/3ML IN SOLN
RESPIRATORY_TRACT | Status: AC
Start: 2024-05-27 — End: 2024-05-27
  Filled 2024-05-27: qty 3

## 2024-05-27 MED ORDER — METHYLPREDNISOLONE SODIUM SUCC 125 MG IJ SOLR
80.0000 mg | Freq: Once | INTRAMUSCULAR | Status: AC
  Administered 2024-05-27: 80 mg via INTRAMUSCULAR

## 2024-05-27 MED ORDER — PANTOPRAZOLE SODIUM 40 MG PO TBEC
40.0000 mg | DELAYED_RELEASE_TABLET | Freq: Two times a day (BID) | ORAL | Status: DC
  Administered 2024-05-28 – 2024-05-30 (×5): 40 mg via ORAL
  Filled 2024-05-27 (×5): qty 1

## 2024-05-27 MED ORDER — DOXYCYCLINE HYCLATE 100 MG PO TABS
100.0000 mg | ORAL_TABLET | Freq: Once | ORAL | Status: AC
  Administered 2024-05-27: 100 mg via ORAL
  Filled 2024-05-27: qty 1

## 2024-05-27 MED ORDER — ALBUTEROL SULFATE HFA 108 (90 BASE) MCG/ACT IN AERS: 2.0000 | INHALATION_SPRAY | RESPIRATORY_TRACT | Status: AC | PRN

## 2024-05-27 MED ORDER — FUROSEMIDE 10 MG/ML IJ SOLN
40.0000 mg | Freq: Once | INTRAMUSCULAR | Status: AC
  Administered 2024-05-28: 40 mg via INTRAVENOUS
  Filled 2024-05-27: qty 4

## 2024-05-27 MED ORDER — SODIUM CHLORIDE 0.9 % IV SOLN
1.0000 g | Freq: Once | INTRAVENOUS | Status: AC
  Administered 2024-05-27: 1 g via INTRAVENOUS
  Filled 2024-05-27: qty 10

## 2024-05-27 MED ORDER — IPRATROPIUM-ALBUTEROL 0.5-2.5 (3) MG/3ML IN SOLN
3.0000 mL | Freq: Once | RESPIRATORY_TRACT | Status: AC
  Administered 2024-05-27: 3 mL via RESPIRATORY_TRACT

## 2024-05-27 MED ORDER — IOHEXOL 350 MG/ML SOLN
75.0000 mL | Freq: Once | INTRAVENOUS | Status: AC | PRN
Start: 2024-05-27 — End: 2024-05-27
  Administered 2024-05-27: 75 mL via INTRAVENOUS

## 2024-05-27 MED ORDER — METHYLPREDNISOLONE SODIUM SUCC 125 MG IJ SOLR
INTRAMUSCULAR | Status: AC
  Filled 2024-05-27: qty 2

## 2024-05-27 MED ORDER — DOXYCYCLINE HYCLATE 100 MG PO TABS
100.0000 mg | ORAL_TABLET | Freq: Two times a day (BID) | ORAL | Status: DC
Start: 2024-05-28 — End: 2024-06-03
  Administered 2024-05-28: 100 mg via ORAL
  Filled 2024-05-27: qty 1

## 2024-05-27 MED ORDER — ALBUTEROL SULFATE HFA 108 (90 BASE) MCG/ACT IN AERS: 2.0000 | INHALATION_SPRAY | RESPIRATORY_TRACT | Status: DC | PRN

## 2024-05-27 MED ORDER — FUROSEMIDE 10 MG/ML IJ SOLN
40.0000 mg | Freq: Once | INTRAMUSCULAR | Status: AC
  Administered 2024-05-27: 40 mg via INTRAVENOUS
  Filled 2024-05-27: qty 4

## 2024-05-27 MED ORDER — METOPROLOL SUCCINATE ER 25 MG PO TB24
25.0000 mg | ORAL_TABLET | Freq: Every day | ORAL | Status: DC
  Administered 2024-05-28 – 2024-05-29 (×2): 25 mg via ORAL
  Filled 2024-05-27 (×2): qty 1

## 2024-05-27 MED ORDER — ASPIRIN 81 MG PO TBEC
81.0000 mg | DELAYED_RELEASE_TABLET | Freq: Every day | ORAL | Status: DC
  Administered 2024-05-28 – 2024-05-29 (×2): 81 mg via ORAL
  Filled 2024-05-27 (×2): qty 1

## 2024-05-27 NOTE — ED Provider Notes (Signed)
 [place] [facility] DEPARTMENT AT [facility] Provider Note   CSN: [id number] Arrival date & time: 05/27/24  1504     Patient presents with: Shortness of Breath   Edward [name] is a 73 y.o. male presented to the ED with shortness of breath.  Patient reports been ongoing for a few weeks but worsened in the past 3 days with feels he cannot catch his breath or do anything in the house, and has difficulty lying down.  He says this happened him last year as well and he thinks it is a seasonal allergy issue.  He denies any known history of COPD or smoking history he reports he was told he may have asthma in the past.  He reports he has an intermittent chest pressure.  Denies any leg swelling.  Denies any history of DVT or PE.  He does report some subjective recent fevers or chills.  He went to urgent care earlier today where he received IV steroids as well as DuoNebs.  He was sent to the ED out of concern for possible concerning changes on EKG.   HPI     Prior to Admission medications   Medication Sig Start Date End Date Taking? Authorizing Provider  acetaminophen  (TYLENOL ) 650 MG CR tablet Take 1,300 mg by mouth every 8 (eight) hours as needed for pain.    [provider]  albuterol  (VENTOLIN  HFA) 108 (90 Base) MCG/ACT inhaler Inhale 2 puffs into the lungs every 4 (four) hours as needed for wheezing or shortness of breath. 05/27/24   Edgardo Pontiff, DO  aspirin  EC 81 MG tablet Take 81 mg by mouth at bedtime. Swallow whole.    [provider]  Coenzyme Q10 (COQ-10) 100 MG CAPS Take 1 capsule by mouth in the morning and at bedtime. 11/02/21   [provider]  finasteride  (PROSCAR ) 5 MG tablet TAKE 1 TABLET (5 MG TOTAL) BY MOUTH DAILY. 01/09/24 01/08/25  Arellano Zameza, Priscila, MD  ibuprofen  (ADVIL ) 200 MG tablet Take 400 mg by mouth every 6 (six) hours as needed for mild pain (pain score 1-3).    [provider]  meloxicam  (MOBIC ) 15 MG tablet Take 1  tablet (15 mg total) by mouth daily as needed for pain. Patient not taking: Reported on 05/16/2024 04/19/24   Syeda, Raeeha, DO  metoprolol  succinate (TOPROL -XL) 25 MG 24 hr tablet TAKE 1 TABLET (25 MG) BY MOUTH DAILY. APPOINTMENT NEEDED FOR CONTINUATION OF REFILLS-1ST ATTEMPT Patient taking differently: Take 25 mg by mouth at bedtime. Take 1 tablet (25 mg) by mouth at bedtime. 01/24/24   Shlomo Wilbert SAUNDERS, MD  Multiple Vitamin (MULTIVITAMIN WITH MINERALS) TABS tablet Take 1 tablet by mouth daily.    [provider]  N-ACETYL CYSTEINE 600 MG TABS Take 1 tablet by mouth at bedtime. 11/02/21   [provider]  OVER THE COUNTER MEDICATION Take 1 capsule by mouth at bedtime. PQQ: Pyrroloquinoline Quinone Take one capsule by mouth daily    [provider]  pantoprazole  (PROTONIX ) 40 MG tablet Take 1 tablet (40 mg total) by mouth 2 (two) times daily. 05/18/24 06/17/24  Waymond Cart, MD  RA CRANBERRY 500 MG CAPS Take 1 capsule by mouth in the morning and at bedtime. 11/02/21   [provider]  rosuvastatin  (CRESTOR ) 10 MG tablet TAKE 1 TABLET BY MOUTH EVERY DAY Patient taking differently: Take 10 mg by mouth 4 (four) times a week. Take one tablet (10mg ) by mouth at bedtime. 12/20/23   Arellano Zameza,  Priscila, MD  sodium chloride  1 g tablet Take 2 tablets (2 g total) by mouth 3 (three) times daily with meals. 05/12/24   Waymond Cart, MD  spironolactone  (ALDACTONE ) 25 MG tablet Take 0.5 tablets (12.5 mg total) by mouth daily. Patient not taking: Reported on 05/16/2024 01/27/24   Shlomo Wilbert SAUNDERS, MD  tamsulosin  (FLOMAX ) 0.4 MG CAPS capsule TAKE 1 CAPSULE BY MOUTH EVERY DAY Patient taking differently: Take 0.4 mg by mouth at bedtime. 01/24/24   Arellano Zameza, Priscila, MD    Allergies: Patient has no known allergies.    Review of Systems  Updated Vital Signs BP (!) 136/96   Pulse (!) 107   Temp 98.4 F (36.9 C)   Resp 16   Ht 6' 1 (1.854 m)   Wt 97.5 kg   SpO2 99%   BMI  28.37 kg/m   Physical Exam Constitutional:      General: He is not in acute distress. HENT:     Head: Normocephalic and atraumatic.  Eyes:     Conjunctiva/sclera: Conjunctivae normal.     Pupils: Pupils are equal, round, and reactive to light.  Cardiovascular:     Rate and Rhythm: Regular rhythm. Tachycardia present.  Pulmonary:     Effort: Pulmonary effort is normal. No respiratory distress.     Comments: Intermittent mild wheezing on the left lower and mid field Abdominal:     General: There is no distension.     Tenderness: There is no abdominal tenderness.  Musculoskeletal:     Right lower leg: No edema.     Left lower leg: No edema.  Skin:    General: Skin is warm and dry.  Neurological:     General: No focal deficit present.     Mental Status: He is alert. Mental status is at baseline.  Psychiatric:        Mood and Affect: Mood normal.        Behavior: Behavior normal.     (all labs ordered are listed, but only abnormal results are displayed) Labs Reviewed  BASIC METABOLIC PANEL WITH GFR - Abnormal; Notable for the following components:      Result Value   Sodium 134 (*)    Glucose, Bld 120 (*)    All other components within normal limits  CBC - Abnormal; Notable for the following components:   WBC 3.2 (*)    RBC 3.74 (*)    Hemoglobin 11.7 (*)    HCT 34.6 (*)    All other components within normal limits  PRO BRAIN NATRIURETIC PEPTIDE - Abnormal; Notable for the following components:   Pro Brain Natriuretic Peptide 516.0 (*)    All other components within normal limits  URINALYSIS, ROUTINE W REFLEX MICROSCOPIC - Abnormal; Notable for the following components:   APPearance HAZY (*)    Nitrite POSITIVE (*)    Leukocytes,Ua LARGE (*)    Bacteria, UA MANY (*)    All other components within normal limits  RESP PANEL BY RT-PCR (RSV, FLU A&B, COVID)  [id number]  URINE CULTURE  TROPONIN T, HIGH SENSITIVITY    EKG: EKG Interpretation Date/Time:  Sunday May 27 2024 15:22:06 EDT Ventricular Rate:  111 PR Interval:    QRS Duration:  97 QT Interval:  339 QTC Calculation: 461 R Axis:   -58  Text Interpretation: Sinus tachycardia Left anterior fascicular block Probable anterior infarct, age indeterminate   Confirmed by Cottie Cough [phone]) on 05/27/2024 3:42:56 PM  Radiology: CT Angio Chest  PE W and/or Wo Contrast Result Date: 05/27/2024 CLINICAL DATA:  Pulmonary embolism suspected, high probability. Worsening shortness of breath and coughing. EXAM: CT ANGIOGRAPHY CHEST WITH CONTRAST TECHNIQUE: Multidetector CT imaging of the chest was performed using the standard protocol during bolus administration of intravenous contrast. Multiplanar CT image reconstructions and MIPs were obtained to evaluate the vascular anatomy. RADIATION DOSE REDUCTION: This exam was performed according to the departmental dose-optimization program which includes automated exposure control, adjustment of the mA and/or kV according to patient size and/or use of iterative reconstruction technique. CONTRAST:  75mL OMNIPAQUE  IOHEXOL  350 MG/ML SOLN COMPARISON:  05/16/2024. FINDINGS: Cardiovascular: The heart is enlarged and there is a trace pericardial effusion. Scattered coronary artery calcifications are noted. There is atherosclerotic calcification of the aorta with aneurysmal dilatation of the ascending aorta measuring 4.1 cm. The pulmonary trunk is normal in caliber. No evidence of pulmonary embolism is seen. Mediastinum/Nodes: No mediastinal, hilar, or axillary lymphadenopathy. The thyroid  gland, trachea, and esophagus are within normal limits. There is a small hiatal hernia. Lungs/Pleura: Pleuroparenchymal scarring is present at the lung apices bilaterally. There is bibasilar atelectasis with consolidation in the lower lobes bilaterally. No effusion or pneumothorax is seen. The previously described pulmonary nodules are not seen due to superimposed atelectasis or infiltrate. Upper  Abdomen: No acute abnormality. Musculoskeletal: Degenerative changes are present in the thoracic spine. No acute osseous abnormality. Review of the MIP images confirms the above findings. IMPRESSION: 1. No evidence of pulmonary embolism. 2. Bibasilar atelectasis with consolidation in the lower lobes bilaterally, possible atelectasis or infiltrate increased from prior exams. 3. Coronary artery calcifications. 4. Small hiatal hernia. 5. Aortic atherosclerosis with aneurysmal dilatation of the ascending aorta measuring 4.1 cm. Recommend annual imaging followup by CTA or MRA. This recommendation follows 2010 ACCF/AHA/AATS/ACR/ASA/SCA/SCAI/SIR/STS/SVM Guidelines for the Diagnosis and Management of Patients with Thoracic Aortic Disease. Circulation. 2010; 121: Z733-z630. Aortic aneurysm NOS (ICD10-I71.9) . Electronically Signed   By: Leita Birmingham M.D.   On: 05/27/2024 18:29   DG Chest 2 View Result Date: 05/27/2024 CLINICAL DATA:  Shortness of breath x3 weeks. EXAM: DG CHEST 2V COMPARISON:  May 16, 2024 FINDINGS: The cardiac silhouette is mildly enlarged and unchanged in size. Low lung volumes are noted. Mild, stable areas of linear scarring and/or atelectasis are seen within the bilateral lung bases. No pleural effusion or pneumothorax is identified. The visualized skeletal structures are unremarkable. IMPRESSION: Low lung volumes with mild bibasilar linear scarring and/or atelectasis. Electronically Signed   By: Suzen Dials M.D.   On: 05/27/2024 14:22     .Critical Care  Performed by: Cottie Donnice PARAS, MD Authorized by: Cottie Donnice PARAS, MD   Critical care provider statement:    Critical care time (minutes):  40   Critical care time was exclusive of:  Separately billable procedures and treating other patients   Critical care was necessary to treat or prevent imminent or life-threatening deterioration of the following conditions:  Respiratory failure   Critical care was time spent personally  by me on the following activities:  Ordering and performing treatments and interventions, ordering and review of laboratory studies, ordering and review of radiographic studies, pulse oximetry, review of old charts, examination of patient and evaluation of patient's response to treatment Comments:     CHF tachypnea diuresis    Medications Ordered in the ED  iohexol  (OMNIPAQUE ) 350 MG/ML injection 75 mL (75 mLs Intravenous Contrast Given 05/27/24 1748)  cefTRIAXone  (ROCEPHIN ) 1 g in sodium chloride  0.9 % 100 mL  IVPB (0 g Intravenous Stopped 05/27/24 1928)  doxycycline  (VIBRA -TABS) tablet 100 mg (100 mg Oral Given 05/27/24 1849)  furosemide (LASIX) injection 40 mg (40 mg Intravenous Given 05/27/24 1850)    Clinical Course as of 05/27/24 2203  Sun May 27, 2024  1638 HR 105 and sinus rhythm [MT]  2202 Admitted to Dr Franky hospitalist [MT]    Clinical Course User Index [MT] Cottie Donnice PARAS, MD                                 Medical Decision Making Amount and/or Complexity of Data Reviewed Labs: ordered. Radiology: ordered.  Risk Prescription drug management. Decision regarding hospitalization.   This patient presents to the ED with concern for shortness of breath. This involves an extensive number of treatment options, and is a complaint that carries with it a high risk of complications and morbidity.  The differential diagnosis includes congestive heart failure versus pneumonia versus asthma or COPD versus pneumothorax versus viral URI including COVID illness versus other  Co-morbidities that complicate the patient evaluation: Reported history of COPD or asthma  Additional history obtained from patient's family at bedside  External records from outside source obtained and reviewed including urgent care evaluation  I ordered and personally interpreted labs.  The pertinent results include: COVID and flu are negative.  BNP elevated at 516.  Troponin undetectable.  UA positive for  nitrites and leukocytes and malodorous.  I ordered imaging studies including x-ray of the chest, CT PE I independently visualized and interpreted imaging which showed potential lower lobe atelectasis versus atypical infection or pneumonia, no acute PE I agree with the radiologist interpretation  The patient was maintained on a cardiac monitor.  I personally viewed and interpreted the cardiac monitored which showed an underlying rhythm of: Sinus tachycardia  Per my interpretation the patient's ECG shows sinus tachycardia with no acute ischemia  I ordered medication including neb for wheezing, suspected asthma or COPD.  IV Lasix for diuresis for pulmonary edema and CHF.  IV Rocephin  and doxycycline  for potential community pneumonia as well as cross coverage for possible UTI  I have reviewed the patients home medicines and have made adjustments as needed  After the interventions noted above, I reevaluated the patient and found that they have: improved -his wheezing appears to have resolved on exam but he does remain mild tachycardic, likely related to albuterol  use, and continues to have tachypnea, able to speak in short sentences.  I do not believe he is requiring BiPAP or intubation.  However, given his age and concern for new onset heart failure, possible pneumonia, I do think hospitalization would be reasonable at this time.  Will continue to diurese and monitor him overnight.  The patient, his wife and daughter are comfortable with this plan.  Dispostion:  After consideration of the diagnostic results and the patient's response to treatment, I feel that the patient would benefit from medical admission      Final diagnoses:  Pneumonia due to infectious organism, unspecified laterality, unspecified part of lung  Congestive heart failure, unspecified HF chronicity, unspecified heart failure type [facility])  Tachypnea    ED Discharge Orders     None          Cottie Donnice PARAS,  MD 05/27/24 2203

## 2024-05-27 NOTE — ED Triage Notes (Signed)
 SOB

## 2024-05-27 NOTE — ED Triage Notes (Signed)
 Patient here today with c/o SOB X 3 weeks. Patient has been using Cetirizine  and and Albuterol  inhaler with little relief. Patient was here previously for similar symptoms. Patient also has some coughing. Patient took a home Covid test before coming in and it was negative. Patient was recently in a hospital for Pancreatitis.

## 2024-05-27 NOTE — ED Provider Notes (Signed)
 [facility]    CSN: [id number] Arrival date & time: 05/27/24  1241      History   Chief Complaint Chief Complaint  Patient presents with   Shortness of Breath    HPI Edward [name] is a 73 y.o. male.   73 year old male who presents urgent care with complaints of shortness of breath for 3 weeks.  This has been associated with some coughing.  He reports that he did have some upper respiratory infection type symptoms initially but those completely resolved and now he continues to have the shortness of breath with coughing.  He relates that he has had similar issues around this time of year secondary to allergies.  He reports that when the ragweed began to come and out that is when his symptoms worsen.  He has been using albuterol  inhaler and taking allergy medication but is not helping much.  The symptoms have worsened.  He denies any fevers, nausea, vomiting, congestion, chest pain, abdominal pain.   Shortness of Breath Associated symptoms: cough   Associated symptoms: no abdominal pain, no chest pain, no ear pain, no fever, no rash, no sore throat and no vomiting     Past Medical History:  Diagnosis Date   Abnormal EKG 05/29/2018   Sinus bradycardia and LAFB with TWI V4-V6, III and aVF   Benign essential HTN 05/29/2018   Cardiomyopathy (HCC)    a. dx by echo 07/2020.   Dilation of aorta    Environmental allergies    Fracture, ribs    H/O: varicose veins    Hearing loss    Hyperlipidemia LDL goal <70    Multiple food allergies    Rash    Seasonal allergies    Sinusitis    Stroke [facility])    s/p tpa    Patient Active Problem List   Diagnosis Date Noted   Gastrointestinal hemorrhage 05/18/2024   Acute blood loss anemia 05/18/2024   Abdominal pain 05/17/2024   Bloody stools 05/17/2024   Gastric artery aneurysm 05/16/2024   Abdominal pain, epigastric 05/16/2024   Hematochezia 05/16/2024   Normocytic anemia 05/16/2024   Acute cystitis without hematuria  05/12/2024   Hyponatremia 05/07/2024   BPH (benign prostatic hyperplasia) 08/22/2023   GERD (gastroesophageal reflux disease) 11/15/2022   Esophageal dysphagia 11/15/2022   Seasonal allergies 11/15/2022   Ischemic vascular disease 11/15/2022   Chronic sinusitis 09/09/2022   Nonischemic cardiomyopathy (HCC) 07/02/2022   Myalgia due to statin 07/28/2021   Mixed hyperlipidemia 06/11/2020   Solitary pulmonary nodule 08/08/2019   Umbilical hernia 08/08/2019   Elevated LDL cholesterol level 08/08/2019   Ascending aorta dilation 05/24/2019   Benign head tremor 05/24/2019   Abnormal EKG 05/29/2018   Benign essential HTN 05/29/2018    Past Surgical History:  Procedure Laterality Date   BIOPSY OF SKIN SUBCUTANEOUS TISSUE AND/OR MUCOUS MEMBRANE  05/18/2024   Procedure: BIOPSY, SKIN, SUBCUTANEOUS TISSUE, OR MUCOUS MEMBRANE;  Surgeon: Stacia Glendia BRAVO, MD;  Location: [facility] ENDOSCOPY;  Service: Gastroenterology;;   COLONOSCOPY     ESOPHAGOGASTRODUODENOSCOPY N/A 05/18/2024   Procedure: EGD (ESOPHAGOGASTRODUODENOSCOPY);  Surgeon: Stacia Glendia BRAVO, MD;  Location: [facility] ENDOSCOPY;  Service: Gastroenterology;  Laterality: N/A;   INGUINAL HERNIA REPAIR Left    right hand surgery         Home Medications    Prior to Admission medications   Medication Sig Start Date End Date Taking? Authorizing Provider  acetaminophen  (TYLENOL ) 650 MG CR tablet Take 1,300 mg by mouth every 8 (  eight) hours as needed for pain.    [provider]  albuterol  (VENTOLIN  HFA) 108 (90 Base) MCG/ACT inhaler Inhale 2 puffs into the lungs every 4 (four) hours as needed for wheezing or shortness of breath. 05/27/24   Edgardo Pontiff, DO  aspirin  EC 81 MG tablet Take 81 mg by mouth at bedtime. Swallow whole.    [provider]  Coenzyme Q10 (COQ-10) 100 MG CAPS Take 1 capsule by mouth in the morning and at bedtime. 11/02/21   [provider]  finasteride  (PROSCAR ) 5 MG tablet TAKE 1 TABLET (5 MG TOTAL) BY  MOUTH DAILY. 01/09/24 01/08/25  Arellano Zameza, Priscila, MD  ibuprofen  (ADVIL ) 200 MG tablet Take 400 mg by mouth every 6 (six) hours as needed for mild pain (pain score 1-3).    [provider]  meloxicam  (MOBIC ) 15 MG tablet Take 1 tablet (15 mg total) by mouth daily as needed for pain. Patient not taking: Reported on 05/16/2024 04/19/24   Syeda, Raeeha, DO  metoprolol  succinate (TOPROL -XL) 25 MG 24 hr tablet TAKE 1 TABLET (25 MG) BY MOUTH DAILY. APPOINTMENT NEEDED FOR CONTINUATION OF REFILLS-1ST ATTEMPT Patient taking differently: Take 25 mg by mouth at bedtime. Take 1 tablet (25 mg) by mouth at bedtime. 01/24/24   Shlomo Wilbert SAUNDERS, MD  Multiple Vitamin (MULTIVITAMIN WITH MINERALS) TABS tablet Take 1 tablet by mouth daily.    [provider]  N-ACETYL CYSTEINE 600 MG TABS Take 1 tablet by mouth at bedtime. 11/02/21   [provider]  OVER THE COUNTER MEDICATION Take 1 capsule by mouth at bedtime. PQQ: Pyrroloquinoline Quinone Take one capsule by mouth daily    [provider]  pantoprazole  (PROTONIX ) 40 MG tablet Take 1 tablet (40 mg total) by mouth 2 (two) times daily. 05/18/24 06/17/24  Waymond Cart, MD  RA CRANBERRY 500 MG CAPS Take 1 capsule by mouth in the morning and at bedtime. 11/02/21   [provider]  rosuvastatin  (CRESTOR ) 10 MG tablet TAKE 1 TABLET BY MOUTH EVERY DAY Patient taking differently: Take 10 mg by mouth 4 (four) times a week. Take one tablet (10mg ) by mouth at bedtime. 12/20/23   Arellano Zameza, Priscila, MD  sodium chloride  1 g tablet Take 2 tablets (2 g total) by mouth 3 (three) times daily with meals. 05/12/24   Waymond Cart, MD  spironolactone  (ALDACTONE ) 25 MG tablet Take 0.5 tablets (12.5 mg total) by mouth daily. Patient not taking: Reported on 05/16/2024 01/27/24   Shlomo Wilbert SAUNDERS, MD  tamsulosin  (FLOMAX ) 0.4 MG CAPS capsule TAKE 1 CAPSULE BY MOUTH EVERY DAY Patient taking differently: Take 0.4 mg by mouth at bedtime. 01/24/24    Arellano Zameza, Priscila, MD    Family History Family History  Problem Relation Age of Onset   CVA Mother    Heart failure Father        CHF   Brain cancer Brother        GBM   Colon polyps Neg Hx    Colon cancer Neg Hx    Esophageal cancer Neg Hx    Rectal cancer Neg Hx    Stomach cancer Neg Hx     Social History Social History   Tobacco Use   Smoking status: Former   Smokeless tobacco: Never   Tobacco comments:    quit 40+ years ago  Vaping Use   Vaping status: Never Used  Substance Use Topics   Alcohol use: Not Currently    Comment: wine 3 x  a week   Drug use: Never     Allergies   Patient has no known allergies.   Review of Systems Review of Systems  Constitutional:  Negative for chills and fever.  HENT:  Negative for ear pain and sore throat.   Eyes:  Negative for pain and visual disturbance.  Respiratory:  Positive for cough and shortness of breath.   Cardiovascular:  Negative for chest pain and palpitations.  Gastrointestinal:  Negative for abdominal pain and vomiting.  Genitourinary:  Negative for dysuria and hematuria.  Musculoskeletal:  Negative for arthralgias and back pain.  Skin:  Negative for color change and rash.  Neurological:  Negative for seizures and syncope.  All other systems reviewed and are negative.    Physical Exam Triage Vital Signs ED Triage Vitals  Encounter Vitals Group     BP 05/27/24 1309 126/80     Girls Systolic BP Percentile --      Girls Diastolic BP Percentile --      Boys Systolic BP Percentile --      Boys Diastolic BP Percentile --      Pulse Rate 05/27/24 1309 62     Resp 05/27/24 1309 16     Temp 05/27/24 1309 98.6 F (37 C)     Temp Source 05/27/24 1309 Oral     SpO2 05/27/24 1309 94 %     Weight --      Height --      Head Circumference --      Peak Flow --      Pain Score 05/27/24 1311 0     Pain Loc --      Pain Education --      Exclude from Growth Chart --    No data found.  Updated Vital  Signs BP 126/80 (BP Location: Left Arm)   Pulse 62   Temp 98.6 F (37 C) (Oral)   Resp 20   SpO2 94%   Visual Acuity Right Eye Distance:   Left Eye Distance:   Bilateral Distance:    Right Eye Near:   Left Eye Near:    Bilateral Near:     Physical Exam Vitals and nursing note reviewed.  Constitutional:      General: He is not in acute distress.    Appearance: He is well-developed.  HENT:     Head: Normocephalic and atraumatic.  Eyes:     Conjunctiva/sclera: Conjunctivae normal.  Cardiovascular:     Rate and Rhythm: Normal rate and regular rhythm.     Heart sounds: No murmur heard. Pulmonary:     Effort: Pulmonary effort is normal. Tachypnea present. No respiratory distress.     Breath sounds: Examination of the right-upper field reveals decreased breath sounds. Examination of the left-upper field reveals decreased breath sounds. Examination of the right-middle field reveals decreased breath sounds. Examination of the left-middle field reveals decreased breath sounds. Examination of the right-lower field reveals decreased breath sounds. Examination of the left-lower field reveals decreased breath sounds. Decreased breath sounds present. No wheezing.  Abdominal:     Palpations: Abdomen is soft.     Tenderness: There is no abdominal tenderness.  Musculoskeletal:        General: No swelling.     Cervical back: Neck supple.  Skin:    General: Skin is warm and dry.     Capillary Refill: Capillary refill takes less than 2 seconds.  Neurological:     Mental Status: He is alert.  Psychiatric:  Mood and Affect: Mood normal.      UC Treatments / Results  Labs (all labs ordered are listed, but only abnormal results are displayed) Labs Reviewed - No data to display  EKG   Radiology DG Chest 2 View Result Date: 05/27/2024 CLINICAL DATA:  Shortness of breath x3 weeks. EXAM: DG CHEST 2V COMPARISON:  May 16, 2024 FINDINGS: The cardiac silhouette is mildly  enlarged and unchanged in size. Low lung volumes are noted. Mild, stable areas of linear scarring and/or atelectasis are seen within the bilateral lung bases. No pleural effusion or pneumothorax is identified. The visualized skeletal structures are unremarkable. IMPRESSION: Low lung volumes with mild bibasilar linear scarring and/or atelectasis. Electronically Signed   By: Suzen Dials M.D.   On: 05/27/2024 14:22    Procedures Procedures (including critical care time)  Medications Ordered in UC Medications  ipratropium-albuterol  (DUONEB) 0.5-2.5 (3) MG/3ML nebulizer solution 3 mL (3 mLs Nebulization Given 05/27/24 1339)  methylPREDNISolone sodium succinate (SOLU-MEDROL) 125 mg/2 mL injection 80 mg (80 mg Intramuscular Given 05/27/24 1339)    Initial Impression / Assessment and Plan / UC Course  I have reviewed the triage vital signs and the nursing notes.  Pertinent labs & imaging results that were available during my care of the patient were reviewed by me and considered in my medical decision making (see chart for details).     Shortness of breath - Plan: DG Chest 2 View, DG Chest 2 View  Tachypnea   Chest x-ray done today shows no acute changes but does show some stable changes from previous.  DuoNeb breathing treatment done today with minimal improvement in symptoms.  Medrol injection given today with minimal improvement in symptoms.  EKG with some concerning changes that are mostly nonspecific.  Given the presentation of symptoms along with minimal improvement and the difficulty of breathing and tachypnea, we recommend going to the emergency department where more advanced and emergent workup can be performed.  Final Clinical Impressions(s) / UC Diagnoses   Final diagnoses:  Shortness of breath  Tachypnea     Discharge Instructions      Chest x-ray done today shows no acute changes but does show some stable changes from previous.  DuoNeb breathing treatment done today with  minimal improvement in symptoms.  Medrol injection given today with minimal improvement in symptoms.  EKG with some concerning changes that are mostly nonspecific.  Given the presentation of symptoms along with minimal improvement and the difficulty of breathing and tachypnea, we recommend going to the emergency department where more advanced and emergent workup can be performed.     ED Prescriptions   None    PDMP not reviewed this encounter.   Teresa Almarie LABOR, PA-C 05/27/24 1437

## 2024-05-27 NOTE — Telephone Encounter (Signed)
   Reason for call:  I received a call from [name] at [facility], reports that he went to [facility] about SOB where he was told to ED for further evaluation. Patient reports that he is currently in the [facility] ED for assessment.    Assessment / Plan / Recommendations:  Patient is encouraged to stay at the ED for full assessment and f/u in the [facility]    Yashar Inclan, DO   05/27/2024, 3:35 PM

## 2024-05-27 NOTE — Progress Notes (Signed)
 [name],  The biopsies taken from your stomach were notable for mild chronic gastritis (inflammation) which is a common finding, but there was no evidence of Helicobacter pylori infection. This common finding is not likely to explain abdominal pain and there is no specific treatment or further evaluation recommended.  The polyps resected from your stomach were benign fundic gland polyps.  There was no evidence of infection with Helicobacter pylori or intestinal metaplasia/dysplasia.  These types of polyps are typically secondary to acid suppression therapy and no specific follow-up is required for these small, benign polyps.

## 2024-05-27 NOTE — ED Notes (Signed)
 Patient is being discharged from the Urgent Care and sent to the Emergency Department via private vehicle . Per provider, patient is in need of higher level of care due to shortness of breath and tachycardia. Patient is aware and verbalizes understanding of plan of care.  Vitals:   05/27/24 1309 05/27/24 1436  BP: 126/80   Pulse: 62 (!) 110  Resp: 20 (!) 28  Temp: 98.6 F (37 C)   SpO2: 94% 94%

## 2024-05-27 NOTE — Discharge Instructions (Addendum)
 Chest x-ray done today shows no acute changes but does show some stable changes from previous.  DuoNeb breathing treatment done today with minimal improvement in symptoms.  Medrol injection given today with minimal improvement in symptoms.  EKG with some concerning changes that are mostly nonspecific.  Given the presentation of symptoms along with minimal improvement and the difficulty of breathing and tachypnea, we recommend going to the emergency department where more advanced and emergent workup can be performed.

## 2024-05-27 NOTE — ED Notes (Signed)
 Pt placed on 2lt [place] for heart support and given spacer for his home inhaler. Instructions given

## 2024-05-28 ENCOUNTER — Telehealth: Payer: Self-pay | Admitting: *Deleted

## 2024-05-28 DIAGNOSIS — J9601 Acute respiratory failure with hypoxia: Secondary | ICD-10-CM | POA: Diagnosis not present

## 2024-05-28 DIAGNOSIS — I7121 Aneurysm of the ascending aorta, without rupture: Secondary | ICD-10-CM | POA: Diagnosis not present

## 2024-05-28 DIAGNOSIS — I110 Hypertensive heart disease with heart failure: Secondary | ICD-10-CM | POA: Diagnosis not present

## 2024-05-28 DIAGNOSIS — Z1152 Encounter for screening for COVID-19: Secondary | ICD-10-CM | POA: Diagnosis not present

## 2024-05-28 DIAGNOSIS — Z791 Long term (current) use of non-steroidal anti-inflammatories (NSAID): Secondary | ICD-10-CM | POA: Diagnosis not present

## 2024-05-28 DIAGNOSIS — Z8673 Personal history of transient ischemic attack (TIA), and cerebral infarction without residual deficits: Secondary | ICD-10-CM | POA: Diagnosis not present

## 2024-05-28 DIAGNOSIS — N400 Enlarged prostate without lower urinary tract symptoms: Secondary | ICD-10-CM | POA: Diagnosis not present

## 2024-05-28 DIAGNOSIS — I509 Heart failure, unspecified: Secondary | ICD-10-CM

## 2024-05-28 DIAGNOSIS — Z22359 Carrier of enterobacterales, unspecified: Secondary | ICD-10-CM | POA: Diagnosis not present

## 2024-05-28 DIAGNOSIS — I5031 Acute diastolic (congestive) heart failure: Secondary | ICD-10-CM | POA: Diagnosis not present

## 2024-05-28 DIAGNOSIS — I700 Atherosclerosis of aorta: Secondary | ICD-10-CM | POA: Diagnosis not present

## 2024-05-28 DIAGNOSIS — D649 Anemia, unspecified: Secondary | ICD-10-CM | POA: Diagnosis not present

## 2024-05-28 DIAGNOSIS — I10 Essential (primary) hypertension: Secondary | ICD-10-CM | POA: Diagnosis not present

## 2024-05-28 DIAGNOSIS — D72819 Decreased white blood cell count, unspecified: Secondary | ICD-10-CM | POA: Diagnosis not present

## 2024-05-28 DIAGNOSIS — J45909 Unspecified asthma, uncomplicated: Secondary | ICD-10-CM | POA: Diagnosis not present

## 2024-05-28 DIAGNOSIS — R8271 Bacteriuria: Secondary | ICD-10-CM | POA: Diagnosis not present

## 2024-05-28 DIAGNOSIS — Z7951 Long term (current) use of inhaled steroids: Secondary | ICD-10-CM | POA: Diagnosis not present

## 2024-05-28 DIAGNOSIS — E785 Hyperlipidemia, unspecified: Secondary | ICD-10-CM | POA: Diagnosis not present

## 2024-05-28 DIAGNOSIS — Z8249 Family history of ischemic heart disease and other diseases of the circulatory system: Secondary | ICD-10-CM | POA: Diagnosis not present

## 2024-05-28 DIAGNOSIS — I5032 Chronic diastolic (congestive) heart failure: Secondary | ICD-10-CM | POA: Diagnosis not present

## 2024-05-28 DIAGNOSIS — Z808 Family history of malignant neoplasm of other organs or systems: Secondary | ICD-10-CM | POA: Diagnosis not present

## 2024-05-28 DIAGNOSIS — Z7982 Long term (current) use of aspirin: Secondary | ICD-10-CM | POA: Diagnosis not present

## 2024-05-28 DIAGNOSIS — Z79899 Other long term (current) drug therapy: Secondary | ICD-10-CM | POA: Diagnosis not present

## 2024-05-28 DIAGNOSIS — I429 Cardiomyopathy, unspecified: Secondary | ICD-10-CM | POA: Diagnosis not present

## 2024-05-28 DIAGNOSIS — J9811 Atelectasis: Secondary | ICD-10-CM | POA: Diagnosis not present

## 2024-05-28 DIAGNOSIS — Z823 Family history of stroke: Secondary | ICD-10-CM | POA: Diagnosis not present

## 2024-05-28 DIAGNOSIS — R0682 Tachypnea, not elsewhere classified: Secondary | ICD-10-CM | POA: Diagnosis not present

## 2024-05-28 DIAGNOSIS — N3000 Acute cystitis without hematuria: Secondary | ICD-10-CM | POA: Diagnosis not present

## 2024-05-28 DIAGNOSIS — E222 Syndrome of inappropriate secretion of antidiuretic hormone: Secondary | ICD-10-CM | POA: Diagnosis not present

## 2024-05-28 DIAGNOSIS — Z87891 Personal history of nicotine dependence: Secondary | ICD-10-CM | POA: Diagnosis not present

## 2024-05-28 DIAGNOSIS — H9190 Unspecified hearing loss, unspecified ear: Secondary | ICD-10-CM | POA: Diagnosis not present

## 2024-05-28 DIAGNOSIS — D500 Iron deficiency anemia secondary to blood loss (chronic): Secondary | ICD-10-CM | POA: Diagnosis not present

## 2024-05-28 LAB — BRAIN NATRIURETIC PEPTIDE: B Natriuretic Peptide: 63.8 pg/mL (ref 0.0–100.0)

## 2024-05-28 MED ORDER — UREA 15 G PO PACK
15.0000 g | PACK | Freq: Two times a day (BID) | ORAL | Status: DC
  Filled 2024-05-28: qty 1

## 2024-05-28 MED ORDER — SODIUM CHLORIDE 0.9 % IV SOLN
1.0000 g | INTRAVENOUS | Status: DC
  Administered 2024-05-28: 1 g via INTRAVENOUS
  Filled 2024-05-28: qty 10

## 2024-05-28 MED ORDER — SPIRONOLACTONE 12.5 MG HALF TABLET
12.5000 mg | ORAL_TABLET | Freq: Every day | ORAL | Status: DC
  Administered 2024-05-28 – 2024-05-30 (×3): 12.5 mg via ORAL
  Filled 2024-05-28 (×3): qty 1

## 2024-05-28 MED ORDER — HYDROXYZINE HCL 25 MG PO TABS
25.0000 mg | ORAL_TABLET | Freq: Three times a day (TID) | ORAL | Status: DC | PRN
  Administered 2024-05-28 – 2024-05-29 (×2): 25 mg via ORAL
  Filled 2024-05-28 (×2): qty 1

## 2024-05-28 MED ORDER — FUROSEMIDE 10 MG/ML IJ SOLN
40.0000 mg | Freq: Two times a day (BID) | INTRAMUSCULAR | Status: DC
  Administered 2024-05-28 – 2024-05-29 (×2): 40 mg via INTRAVENOUS
  Filled 2024-05-28 (×2): qty 4

## 2024-05-28 MED ORDER — ALBUTEROL SULFATE (2.5 MG/3ML) 0.083% IN NEBU: 2.5000 mg | INHALATION_SOLUTION | RESPIRATORY_TRACT | Status: DC | PRN

## 2024-05-28 NOTE — Progress Notes (Signed)
   05/28/24 2114  BiPAP/CPAP/SIPAP  $ Non-Invasive Ventilator  Non-Invasive Vent Initial  $ Face Mask Large  Yes  BiPAP/CPAP/SIPAP Pt Type Adult  BiPAP/CPAP/SIPAP SERVO  Mask Type Full face mask  Mask Size Large  IPAP 10 cmH20  EPAP 5 cmH2O  FiO2 (%) 32 %  Flow Rate 3 lpm  Patient Home Machine No  Patient Home Mask No  Patient Home Tubing No  Auto Titrate No  Device Plugged into RED Power Outlet Yes  BiPAP/CPAP /SiPAP Vitals  Pulse Rate (!) 118  SpO2 97 %  Bilateral Breath Sounds Clear;Diminished  MEWS Score/Color  MEWS Score 2  MEWS Score Color Yellow

## 2024-05-28 NOTE — ED Notes (Signed)
 Called Nataya at [facility] for transport

## 2024-05-28 NOTE — Telephone Encounter (Signed)
 Called and spoke with pt, I cancel his appt on 10/1 with Dr. Elicia. Pt will call back to resch.

## 2024-05-28 NOTE — Telephone Encounter (Signed)
 Copied from CRM #[id number]. Topic: Appointments - Appointment Cancel/Reschedule >> May 28, 2024  9:38 AM Miquel SAILOR wrote: Patient/patient representative is calling to cancel or reschedule an appointment. Refer to attachments for appointment information.    Patient needs to cancel imaging app. Transferred to  [phone]

## 2024-05-28 NOTE — ED Provider Notes (Signed)
 Requested by nursing to evaluate the patient.  On my evaluation nasal cannula is in place.  Tachypneic and tachycardic with slightly increased work of breathing but he is able to provide some history.  States that he feels winded.  He apparently got up and walked independently and then began to feel worse.  Never had BiPAP before.  Given tachypnea and work of breathing, discussed with him that I felt like it might benefit him given likely new heart failure.  He is agreeable to try it.  12:19 AM Patient appears much improved.  Hospitalist updated. Physical Exam  BP (!) 132/104   Pulse (!) 115   Temp 98.5 F (36.9 C) (Oral)   Resp 18   Ht 1.854 m (6' 1)   Wt 97.5 kg   SpO2 98%   BMI 28.37 kg/m   Physical Exam Awake, alert Nasal cannula, increased work of breathing Tachycardia Procedures  Procedures  ED Course / MDM   Clinical Course as of 05/28/24 0018  Sun May 27, 2024  1638 HR 105 and sinus rhythm [MT]  2202 Admitted to Dr Franky hospitalist [MT]    Clinical Course User Index [MT] Cottie Donnice PARAS, MD   Medical Decision Making Amount and/or Complexity of Data Reviewed Labs: ordered. Radiology: ordered.  Risk OTC drugs. Prescription drug management. Decision regarding hospitalization.          Bari Charmaine FALCON, MD 05/28/24 [phone]

## 2024-05-28 NOTE — ED Notes (Addendum)
 RT ambulated with pt with pulse ox attached to patient. His O2 sat remained 90-96% during the walk. Pt walked on room air. He was winded upon our arrival to his room so RT placed him on a 2L [place].

## 2024-05-28 NOTE — H&P (Signed)
 History and Physical    Patient: Edward [name] FMW:[id number] DOB: 1950-10-22 DOA: 05/27/2024 DOS: the patient was seen and examined on 05/28/2024 PCP: Francesco Elsie NOVAK, MD  Patient coming from: Home  Chief Complaint:  Chief Complaint  Patient presents with   Shortness of Breath   HPI: Edward [name] is a 73 y.o. male with medical history significant of HFpEF (EF 45-50%; G1DD in 08/2023), bradycardia, aortic aneurysm, HTN, HLD, CVA s/p TPA in 2016, BPH, and two recent admission this month (SIADH w/ severe hypoNa 115 and acute pancreatitis c/b E. Coli UTI from 9/8-9/13, and hematochezia from diverticulosis from 9/17-9/19) presents as transfer from OSH for ADHF.  The patient began experiencing shortness of breath approximately one month ago, which progressively worsened. Initially, the patient attempted to manage symptoms with cetirizine , albuterol , and a medication possibly referring to montelukast, suspecting allergy-related asthma, but these treatments were ineffective. The patient reported difficulty sleeping due to the inability to lie down comfortably. After symptoms worsened, particularly on Saturday before the hospital visit, the patient sought medical attention. Prior to hospital admission, the patient visited urgent care and was advised to go to the ER, where the patient was subsequently transferred to the current hospital. At the OSH ED, the patient was diagnosed with congestive heart failure and pneumonia and received IV Lasix (furosemide) to manage symptoms. The patient reported improvement after using BiPAP overnight, although discomfort with the device. The patient noted some regression in symptoms after discontinuing BiPAP use during the day.  In the OSH ED, pt tachycardic, and tachypneic W/ hypoxia (2L [place]; on RA pta). Labs notable for WBC 3.2. UA w/ large leukocyte esterase, nitrite positive, and many bacteria. OSH EDP requested PCU admission given c/f ADHF.   Review of Systems: As  mentioned in the history of present illness. All other systems reviewed and are negative. Past Medical History:  Diagnosis Date   Abnormal EKG 05/29/2018   Sinus bradycardia and LAFB with TWI V4-V6, III and aVF   Benign essential HTN 05/29/2018   Cardiomyopathy (HCC)    a. dx by echo 07/2020.   Dilation of aorta    Environmental allergies    Fracture, ribs    H/O: varicose veins    Hearing loss    Hyperlipidemia LDL goal <70    Multiple food allergies    Rash    Seasonal allergies    Sinusitis    Stroke [facility])    s/p tpa   Past Surgical History:  Procedure Laterality Date   BIOPSY OF SKIN SUBCUTANEOUS TISSUE AND/OR MUCOUS MEMBRANE  05/18/2024   Procedure: BIOPSY, SKIN, SUBCUTANEOUS TISSUE, OR MUCOUS MEMBRANE;  Surgeon: Stacia Glendia BRAVO, MD;  Location: [facility] ENDOSCOPY;  Service: Gastroenterology;;   COLONOSCOPY     ESOPHAGOGASTRODUODENOSCOPY N/A 05/18/2024   Procedure: EGD (ESOPHAGOGASTRODUODENOSCOPY);  Surgeon: Stacia Glendia BRAVO, MD;  Location: [facility] ENDOSCOPY;  Service: Gastroenterology;  Laterality: N/A;   INGUINAL HERNIA REPAIR Left    right hand surgery     Social History:  reports that he has quit smoking. He has never used smokeless tobacco. He reports that he does not currently use alcohol. He reports that he does not use drugs.  No Known Allergies  Family History  Problem Relation Age of Onset   CVA Mother    Heart failure Father        CHF   Brain cancer Brother        GBM   Colon polyps Neg Hx    Colon cancer  Neg Hx    Esophageal cancer Neg Hx    Rectal cancer Neg Hx    Stomach cancer Neg Hx     Prior to Admission medications   Medication Sig Start Date End Date Taking? Authorizing Provider  acetaminophen  (TYLENOL ) 650 MG CR tablet Take 1,300 mg by mouth every 8 (eight) hours as needed for pain.    [provider]  albuterol  (VENTOLIN  HFA) 108 (90 Base) MCG/ACT inhaler Inhale 2 puffs into the lungs every 4 (four) hours as needed for wheezing or  shortness of breath. 05/27/24   Edgardo Pontiff, DO  aspirin  EC 81 MG tablet Take 81 mg by mouth at bedtime. Swallow whole.    [provider]  Coenzyme Q10 (COQ-10) 100 MG CAPS Take 1 capsule by mouth in the morning and at bedtime. 11/02/21   [provider]  finasteride  (PROSCAR ) 5 MG tablet TAKE 1 TABLET (5 MG TOTAL) BY MOUTH DAILY. 01/09/24 01/08/25  Arellano Zameza, Priscila, MD  ibuprofen  (ADVIL ) 200 MG tablet Take 400 mg by mouth every 6 (six) hours as needed for mild pain (pain score 1-3).    [provider]  meloxicam  (MOBIC ) 15 MG tablet Take 1 tablet (15 mg total) by mouth daily as needed for pain. Patient not taking: Reported on 05/16/2024 04/19/24   Syeda, Raeeha, DO  metoprolol  succinate (TOPROL -XL) 25 MG 24 hr tablet TAKE 1 TABLET (25 MG) BY MOUTH DAILY. APPOINTMENT NEEDED FOR CONTINUATION OF REFILLS-1ST ATTEMPT Patient taking differently: Take 25 mg by mouth at bedtime. Take 1 tablet (25 mg) by mouth at bedtime. 01/24/24   Shlomo Wilbert SAUNDERS, MD  Multiple Vitamin (MULTIVITAMIN WITH MINERALS) TABS tablet Take 1 tablet by mouth daily.    [provider]  N-ACETYL CYSTEINE 600 MG TABS Take 1 tablet by mouth at bedtime. 11/02/21   [provider]  OVER THE COUNTER MEDICATION Take 1 capsule by mouth at bedtime. PQQ: Pyrroloquinoline Quinone Take one capsule by mouth daily    [provider]  pantoprazole  (PROTONIX ) 40 MG tablet Take 1 tablet (40 mg total) by mouth 2 (two) times daily. 05/18/24 06/17/24  Waymond Cart, MD  RA CRANBERRY 500 MG CAPS Take 1 capsule by mouth in the morning and at bedtime. 11/02/21   [provider]  rosuvastatin  (CRESTOR ) 10 MG tablet TAKE 1 TABLET BY MOUTH EVERY DAY Patient taking differently: Take 10 mg by mouth 4 (four) times a week. Take one tablet (10mg ) by mouth at bedtime. 12/20/23   Arellano Zameza, Priscila, MD  sodium chloride  1 g tablet Take 2 tablets (2 g total) by mouth 3 (three) times daily with meals.  05/12/24   Waymond Cart, MD  spironolactone  (ALDACTONE ) 25 MG tablet Take 0.5 tablets (12.5 mg total) by mouth daily. Patient not taking: Reported on 05/16/2024 01/27/24   Shlomo Wilbert SAUNDERS, MD  tamsulosin  (FLOMAX ) 0.4 MG CAPS capsule TAKE 1 CAPSULE BY MOUTH EVERY DAY Patient taking differently: Take 0.4 mg by mouth at bedtime. 01/24/24   Arellano Zameza, Priscila, MD    Physical Exam: Vitals:   05/28/24 1037 05/28/24 1100 05/28/24 1200 05/28/24 1432  BP:  (!) 116/96 (!) 129/90 (!) 134/90  Pulse:  (!) 106  96  Resp:  (!) 28  20  Temp: 98.1 F (36.7 C)   98.1 F (36.7 C)  TempSrc: Oral   Oral  SpO2:  97%  100%  Weight:    90.3 kg  Height:    6' (1.829 m)   General:  Alert, oriented x3, resting comfortably in no acute distress Respiratory: Lungs clear to auscultation bilaterally with normal respiratory effort; no w/r/r Cardiovascular: Regular rate and rhythm w/o m/r/g Abdomen: Soft, nontender, nondistended. Positive bowel sounds   Data Reviewed:  Lab Results  Component Value Date   WBC 3.2 (L) 05/27/2024   HGB 11.7 (L) 05/27/2024   HCT 34.6 (L) 05/27/2024   MCV 92.5 05/27/2024   PLT 373 05/27/2024   Lab Results  Component Value Date   GLUCOSE 120 (H) 05/27/2024   CALCIUM  9.5 05/27/2024   NA 134 (L) 05/27/2024   K 4.7 05/27/2024   CO2 23 05/27/2024   CL 98 05/27/2024   BUN 9 05/27/2024   CREATININE 0.96 05/27/2024   Lab Results  Component Value Date   ALT 55 (H) 05/16/2024   AST 27 05/16/2024   ALKPHOS 69 05/16/2024   BILITOT 1.0 05/16/2024   Lab Results  Component Value Date   INR 1.0 05/18/2024   Radiology: CT Angio Chest PE W and/or Wo Contrast Result Date: 05/27/2024 CLINICAL DATA:  Pulmonary embolism suspected, high probability. Worsening shortness of breath and coughing. EXAM: CT ANGIOGRAPHY CHEST WITH CONTRAST TECHNIQUE: Multidetector CT imaging of the chest was performed using the standard protocol during bolus administration of intravenous contrast.  Multiplanar CT image reconstructions and MIPs were obtained to evaluate the vascular anatomy. RADIATION DOSE REDUCTION: This exam was performed according to the departmental dose-optimization program which includes automated exposure control, adjustment of the mA and/or kV according to patient size and/or use of iterative reconstruction technique. CONTRAST:  75mL OMNIPAQUE  IOHEXOL  350 MG/ML SOLN COMPARISON:  05/16/2024. FINDINGS: Cardiovascular: The heart is enlarged and there is a trace pericardial effusion. Scattered coronary artery calcifications are noted. There is atherosclerotic calcification of the aorta with aneurysmal dilatation of the ascending aorta measuring 4.1 cm. The pulmonary trunk is normal in caliber. No evidence of pulmonary embolism is seen. Mediastinum/Nodes: No mediastinal, hilar, or axillary lymphadenopathy. The thyroid  gland, trachea, and esophagus are within normal limits. There is a small hiatal hernia. Lungs/Pleura: Pleuroparenchymal scarring is present at the lung apices bilaterally. There is bibasilar atelectasis with consolidation in the lower lobes bilaterally. No effusion or pneumothorax is seen. The previously described pulmonary nodules are not seen due to superimposed atelectasis or infiltrate. Upper Abdomen: No acute abnormality. Musculoskeletal: Degenerative changes are present in the thoracic spine. No acute osseous abnormality. Review of the MIP images confirms the above findings. IMPRESSION: 1. No evidence of pulmonary embolism. 2. Bibasilar atelectasis with consolidation in the lower lobes bilaterally, possible atelectasis or infiltrate increased from prior exams. 3. Coronary artery calcifications. 4. Small hiatal hernia. 5. Aortic atherosclerosis with aneurysmal dilatation of the ascending aorta measuring 4.1 cm. Recommend annual imaging followup by CTA or MRA. This recommendation follows 2010 ACCF/AHA/AATS/ACR/ASA/SCA/SCAI/SIR/STS/SVM Guidelines for the Diagnosis and  Management of Patients with Thoracic Aortic Disease. Circulation. 2010; 121: Z733-z630. Aortic aneurysm NOS (ICD10-I71.9) . Electronically Signed   By: Leita Birmingham M.D.   On: 05/27/2024 18:29    Assessment and Plan: 48M h/o HFpEF (EF 45-50%; G1DD in 08/2023), bradycardia, aortic aneurysm, HTN, HLD, CVA s/p TPA in 2016, BPH, and two recent admission this month (SIADH w/ severe hypoNa 115 and acute pancreatitis c/b E. Coli UTI from 9/8-9/13, and hematochezia from diverticulosis from 9/17-9/19) presents as transfer from OSH for ADHF.  ADHF H/o HFpEF Low nl EF 45-50% in 08/2023 w/ G1DD; high suspicion that starting pt on high dose Na tabs has led to acute decompensated heart  failure -D/c sodium 2g TID (previously started for ?SIADH) -IV lasix 40mg  BID for now; goal net neg 1-2L/d; strict I/Os; daily standing weights; K>4/Mg>2 -PTA metoprolol  succinate 25mg  daily and spironolactone  12.5mg  daily -Don't anticipate a need for long-term diuretics, but would advise close OP f/u with Cards  Abnl UA Presumed acute cystitis H/o E. Coli UTI (pan-sensitive) -Continue IV CTX 1g daily for now -F/u urine culture and adjust abx accordingly  Concern for SIADH on last admission H/o severe hyponatremia Per my review, suspect simple hypovolemic hypoNA iso UTI and pancreatitis; pt denies no prior h/o low Na issues -D/c pta salt tabs 2g TID -OP PCP f/u for ongoing Na monitoroing; consider fluid restriciton vs urea 15mg  BID in the future if needed for ongooing hypoNa  H/o CVA -PTA ASA 81mg  daily -Consider high dose statin on discharge  BPH -PTA flomax  and finasteride     Advance Care Planning:   Code Status: Prior   Consults: N/A  Family Communication: Spouse, Catherine  Severity of Illness: The appropriate patient status for this patient is INPATIENT. Inpatient status is judged to be reasonable and necessary in order to provide the required intensity of service to ensure the patient's safety. The  patient's presenting symptoms, physical exam findings, and initial radiographic and laboratory data in the context of their chronic comorbidities is felt to place them at high risk for further clinical deterioration. Furthermore, it is not anticipated that the patient will be medically stable for discharge from the hospital within 2 midnights of admission.   * I certify that at the point of admission it is my clinical judgment that the patient will require inpatient hospital care spanning beyond 2 midnights from the point of admission due to high intensity of service, high risk for further deterioration and high frequency of surveillance required.*   ------- I spent 57 minutes reviewing previous notes, at the bedside counseling/discussing the treatment plan, and performing clinical documentation.  Author: Marsha Ada, MD 05/28/2024 3:32 PM  For on call review [email or web address].

## 2024-05-29 ENCOUNTER — Ambulatory Visit (HOSPITAL_COMMUNITY)

## 2024-05-29 DIAGNOSIS — I10 Essential (primary) hypertension: Secondary | ICD-10-CM | POA: Diagnosis not present

## 2024-05-29 DIAGNOSIS — J9601 Acute respiratory failure with hypoxia: Secondary | ICD-10-CM | POA: Diagnosis not present

## 2024-05-29 DIAGNOSIS — Z22358 Carrier of other enterobacterales: Secondary | ICD-10-CM

## 2024-05-29 DIAGNOSIS — D72819 Decreased white blood cell count, unspecified: Secondary | ICD-10-CM | POA: Diagnosis present

## 2024-05-29 DIAGNOSIS — Z79899 Other long term (current) drug therapy: Secondary | ICD-10-CM

## 2024-05-29 DIAGNOSIS — I5031 Acute diastolic (congestive) heart failure: Secondary | ICD-10-CM

## 2024-05-29 DIAGNOSIS — D649 Anemia, unspecified: Secondary | ICD-10-CM

## 2024-05-29 DIAGNOSIS — R8271 Bacteriuria: Secondary | ICD-10-CM

## 2024-05-29 DIAGNOSIS — I700 Atherosclerosis of aorta: Secondary | ICD-10-CM | POA: Diagnosis present

## 2024-05-29 LAB — URINE CULTURE: Culture: >=100000 — AB

## 2024-05-29 LAB — CBC WITH DIFFERENTIAL/PLATELET
Abs Immature Granulocytes: 0.03 K/uL (ref 0.00–0.07)
Basophils Absolute: 0.1 K/uL (ref 0.0–0.1)
Basophils Relative: 1 %
Eosinophils Absolute: 0.5 K/uL (ref 0.0–0.5)
Eosinophils Relative: 10 %
HCT: 35.5 % — ABNORMAL LOW (ref 39.0–52.0)
Hemoglobin: 11.7 g/dL — ABNORMAL LOW (ref 13.0–17.0)
Immature Granulocytes: 1 %
Lymphocytes Relative: 24 %
Lymphs Abs: 1.2 K/uL (ref 0.7–4.0)
MCH: 30.4 pg (ref 26.0–34.0)
MCHC: 33 g/dL (ref 30.0–36.0)
MCV: 92.2 fL (ref 80.0–100.0)
Monocytes Absolute: 0.9 K/uL (ref 0.1–1.0)
Monocytes Relative: 17 %
Neutro Abs: 2.4 K/uL (ref 1.7–7.7)
Neutrophils Relative %: 47 %
Platelets: 473 K/uL — ABNORMAL HIGH (ref 150–400)
RBC: 3.85 MIL/uL — ABNORMAL LOW (ref 4.22–5.81)
RDW: 13.3 % (ref 11.5–15.5)
WBC: 5 K/uL (ref 4.0–10.5)
nRBC: 0 % (ref 0.0–0.2)

## 2024-05-29 LAB — TECHNOLOGIST SMEAR REVIEW: Plt Morphology: NORMAL

## 2024-05-29 LAB — BASIC METABOLIC PANEL WITH GFR
Anion gap: 10 (ref 5–15)
BUN: 23 mg/dL (ref 8–23)
CO2: 30 mmol/L (ref 22–32)
Calcium: 8.5 mg/dL — ABNORMAL LOW (ref 8.9–10.3)
Chloride: 95 mmol/L — ABNORMAL LOW (ref 98–111)
Creatinine, Ser: 1.23 mg/dL (ref 0.61–1.24)
GFR, Estimated: >60 mL/min (ref >60)
Glucose, Bld: 85 mg/dL (ref 70–99)
Potassium: 3.9 mmol/L (ref 3.5–5.1)
Sodium: 135 mmol/L (ref 135–145)

## 2024-05-29 NOTE — Progress Notes (Signed)
 Heart Failure Nurse Navigator Progress Note  PCP: Francesco Elsie NOVAK, MD PCP-Cardiologist: Shlomo Admission Diagnosis: Pneumonia, Congestive heart failure, Tachypnea Admitted from: Home  Presentation:   [name] presented with shortness of breath x 3 weeks, Has been using inhalers, with no relief. Recent hospitalization for Pancreatitis. BP 126/80, HR 62, BNP 63, Pro BNP 516.   Patient was educated on the sign and symptoms of heart failure, daily weights, when to call his doctor or go to the ED. Diet/ fluid restrictions , reports to using salt in his cooking, or when he goes out to eat, and some soda. Continued education on taking all medications as prescribed and attending all medical appointments. Patient verbalized his understanding of all education. A HF TOC appointment was scheduled for 06/14/2024 @ 2 pm.   ECHO/ LVEF: 45-50%  Clinical Course:  Past Medical History:  Diagnosis Date   Abnormal EKG 05/29/2018   Sinus bradycardia and LAFB with TWI V4-V6, III and aVF   Benign essential HTN 05/29/2018   Cardiomyopathy (HCC)    a. dx by echo 07/2020.   Dilation of aorta    Environmental allergies    Fracture, ribs    H/O: varicose veins    Hearing loss    Hyperlipidemia LDL goal <70    Multiple food allergies    Rash    Seasonal allergies    Sinusitis    Stroke [facility])    s/p tpa     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  Tobacco Use   Smoking status: Former   Smokeless tobacco: Never   Tobacco comments:    quit 40+ years ago  Vaping Use   Vaping status: Never Used  Substance and Sexual Activity   Alcohol use: Not Currently    Comment: wine 3 x a week   Drug use: Never   Sexual activity: Yes    Partners: Female  Other Topics  Concern   Not on file  Social History Narrative   Not on file   Social Drivers of Health   Financial Resource Strain: Low Risk  (01/19/2023)   Overall Financial Resource Strain (CARDIA)    Difficulty of Paying Living Expenses: Not hard at all  Food Insecurity: No Food Insecurity (05/28/2024)   Hunger Vital Sign    Worried About Running Out of Food in the Last Year: Never true    Ran Out of Food in the Last Year: Never true  Transportation Needs: No Transportation Needs (05/28/2024)   PRAPARE - Administrator, Civil Service (Medical): No    Lack of Transportation (Non-Medical): No  Physical Activity: Sufficiently Active (01/19/2023)   Exercise Vital Sign    Days of Exercise per Week: 7 days    Minutes of Exercise per Session: 30 min  Stress: No Stress Concern Present (01/19/2023)   [employer] of Occupational Health - Occupational Stress Questionnaire    Feeling of Stress : Not at all  Social Connections: Socially Integrated (05/28/2024)   Social Connection and Isolation Panel    Frequency of Communication with Friends and Family: More than three times a week    Frequency of Social Gatherings with Friends and Family: More than three times  a week    Attends Religious Services: More than 4 times per year    Active Member of Clubs or Organizations: Yes    Attends Engineer, structural: More than 4 times per year    Marital Status: Married   Water engineer and Provision:  Detailed education and instructions provided on heart failure disease management including the following:  Signs and symptoms of Heart Failure When to call the physician Importance of daily weights Low sodium diet Fluid restriction Medication management Anticipated future follow-up appointments  Patient education given on each of the above topics.  Patient acknowledges understanding via teach back method and acceptance of all instructions.  Education Materials:  Living Better With  Heart Failure Booklet, HF zone tool, & Daily Weight Tracker Tool.  Patient has scale at home: Yes Patient has pill box at home: Yes    High Risk Criteria for Readmission and/or Poor Patient Outcomes: Heart failure hospital admissions (last 6 months): 1  No Show rate: 2 % Difficult social situation: No Demonstrates medication adherence: Yes Primary Language: English  Literacy level: Reading, writing, and comprehension.   Barriers of Care:   Multiple admissions recently Diet/ fluid restrictions ( salt with cooking, some soda)  Daily weights  Considerations/Referrals:   Referral made to Heart Failure Pharmacist Stewardship: NA Referral made to Heart Failure CSW/NCM TOC: NA Referral made to Heart & Vascular TOC clinic: Yes, 06/14/2024 @ 2 pm.   Items for Follow-up on DC/TOC: Continued HF education Diet/ fluid restrictions/ daily weights   Stephane Haddock, BSN, RN Heart Failure Teacher, adult education Only

## 2024-05-29 NOTE — Progress Notes (Signed)
                 Interval history 73 year old admitted by hospitalist service for acute hypoxic respiratory failure due to CHF with volume overload. Started on IV Lasix to good effect with decreasing supplemental oxygen need from 2 liter/min at rest to none at rest. TRH requested I see in transfer to my service as he is an IMTS clinic patient.   He reports ongoing dyspnea with speaking. Ongoing orthopnea, can't tolerate laying flat. Cough that bothers him at night time is dry and positional, associated with laying flat. Denies dysuria, hesitancy, need to strain.  RN has been with him since admission thinks he looks improved today.  Physical exam Blood pressure 92/76, pulse 77, temperature 98.7 F (37.1 C), temperature source Oral, resp. rate 20, height 6' (1.829 m), weight 90.3 kg, SpO2 96%.  Well-appearing overall Heart rate normal, rhythm regular, JV pulsations evident above clavicle at 30 degrees, strong radial pulses Breathing is somewhat fast, speaking in clipped sentences, lungs without crackles but overall decreased air movement Skin warm and dry Abdomen soft and non-tender Alert and oriented, normal speech, no facial asymmetry, moving extremities normally  Weight change: -7.223 kg   Intake/Output Summary (Last 24 hours) at 05/29/2024 1446 Last data filed at 05/29/2024 0900 Gross per 24 hour  Intake 480 ml  Output 1800 ml  Net -1320 ml   Net IO Since Admission: -3,719.62 mL [05/29/24 1446]  Labs, images, and other studies Electrolytes and creatinine WNL this morning CBC w/ improving anemia   Assessment and plan Hospital day 1  Edward [name] is a 73 y.o. with history of hypertension who is admitted for mild hypoxic respiratory failure.  Principal Problem:   Acute respiratory failure with hypoxia (HCC) Improving. No supplemental O2 requirement at rest today. Still with dyspnea and tachypnea. CHF syndrome remains on differential although he doesn't look volume overloaded  on exam. Admission BNP < 100 although pro-BNP marginally elevated. Query underlying lung disease and wonder if higher-resolution cross-sectional imaging would be helpful. No prior PFTs as outpatient, he needs to have this done. Pneumonia and PE ruled out. Continue working on O2 wean, ambulate w/ SpO2.  Active Problems:   Acute CHF (congestive heart failure) (HCC) With mildly decreased LV function on January 2025 echo. No real clinical signs of volume overload today. Hold Lasix for now. He's well-perfused, doubt significant drop in LV function. Echo may help in this case. Outpatient metoprolol  and spironolactone  were resumed on admission. -continue metoprolol  succinate 25 mg daily -continue spironolactone  12.5 mg daily    Benign essential HTN Chronic, stable. Reports good control outside of hospital. BP okay now. Antihypertensives resumed, per above.    Leukopenia Acute. With concomitant anemia, latter probably explained by recent GI bleeding and improving. Check CBC w/ diff and peripheral smear. Trend WBC count.    ESBL E. coli carrier Asymptomatic bacteriuria. Stopping ceftriaxone . Deferring further antibiotics for now.    Aortic atherosclerosis   Ascending aortic aneurysm Chronic, stable. Yearly follow-up of aneurysm is appropriate. History of cerebral infarction noted. Outpatient aspirin  resumed.  Resolved Problems:   * No resolved hospital problems. *  VTE prophylaxis:  Diet: sodium-restricted  Code: full PT/OT recommendations: requested consult for SOB w/ activity Family Update: wife at bedside  Discharge plan: pending workup of respiratory failure   Edward Kung MD 05/29/2024, 2:46 PM  Pager: [phone]

## 2024-05-29 NOTE — Progress Notes (Signed)
 Evaluated patient at bedside to address his code status that was not placed upon admission or addressed again. Patient wishes to remain full code at this time. He denies any other complaints at this moment.  Plan: -Full code entered  -SCDs entered for VTE prophylaxis at this time

## 2024-05-29 NOTE — Progress Notes (Signed)
   05/29/24 2041  BiPAP/CPAP/SIPAP  $ Non-Invasive Home Ventilator  Subsequent  BiPAP/CPAP/SIPAP Pt Type Adult  BiPAP/CPAP/SIPAP Resmed  Reason BIPAP/CPAP not in use (S)  Non-compliant (I dont use one at home)

## 2024-05-29 NOTE — Progress Notes (Incomplete)
 HD#1 SUBJECTIVE:  Patient Summary:Edward [name], a 73 year old male with HFpEF (EF 45-50%), bradycardia, aortic aneurysm, hypertension, hyperlipidemia, prior CVA s/p TPA in 2016, BPH, and two recent admissions this month for SIADH with severe hyponatremia, acute pancreatitis complicated by E. coli UTI, and hematochezia from diverticulosis, presents as a transfer from an outside hospital for acute decompensated heart failure..   Overnight Events:   Interim History:   OBJECTIVE:  Vital Signs: Vitals:   05/29/24 1130 05/29/24 1515 05/29/24 1518 05/29/24 1600  BP: 92/76   (!) 100/59  Pulse: 77   86  Resp: 20   20  Temp: 98.7 F (37.1 C)   98.1 F (36.7 C)  TempSrc: Oral   Oral  SpO2: 96% 96% 99% 93%  Weight:      Height:       Supplemental O2: {NAMES:3044014::Room Air,Nasal Cannula,Simple Face Mask,Partial Rebreather,HFNC,Non Rebreather,Venturi Mask,Bag Valve Mask} SpO2: 93 % O2 Flow Rate (L/min): 2 L/min FiO2 (%): 32 %  Filed Weights   05/27/24 1521 05/28/24 1432  Weight: 97.5 kg 90.3 kg     Intake/Output Summary (Last 24 hours) at 05/29/2024 1750 Last data filed at 05/29/2024 1518 Gross per 24 hour  Intake 480 ml  Output 2400 ml  Net -1920 ml   Net IO Since Admission: -4,319.62 mL [05/29/24 1750]  Physical Exam: Physical Exam  Patient Lines/Drains/Airways Status     Active Line/Drains/Airways     Name Placement date Placement time Site Days   Peripheral IV 05/27/24 20 G Right Antecubital 05/27/24  1610  Antecubital  2            Pertinent labs and imaging:      Latest Ref Rng & Units 05/27/2024    4:10 PM 05/18/2024    3:54 AM 05/17/2024    1:58 AM  CBC  WBC 4.0 - 10.5 K/uL 3.2  6.7  8.9   Hemoglobin 13.0 - 17.0 g/dL 88.2  89.7  9.7   Hematocrit 39.0 - 52.0 % 34.6  28.4  27.7   Platelets 150 - 400 K/uL 373  245  249        Latest Ref Rng & Units 05/29/2024   10:59 AM 05/27/2024    4:10 PM 05/18/2024    3:54 AM  CMP  Glucose 70 -  99 mg/dL 85  879  88   BUN 8 - 23 mg/dL 23  9  9    Creatinine 0.61 - 1.24 mg/dL 8.76  9.03  9.14   Sodium 135 - 145 mmol/L 135  134  129   Potassium 3.5 - 5.1 mmol/L 3.9  4.7  4.0   Chloride 98 - 111 mmol/L 95  98  97   CO2 22 - 32 mmol/L 30  23  24    Calcium  8.9 - 10.3 mg/dL 8.5  9.5  8.2     No results found.  ASSESSMENT/PLAN:  Assessment: Principal Problem:   Acute respiratory failure with hypoxia (HCC) Active Problems:   Benign essential HTN   Ascending aortic aneurysm   Acute CHF (congestive heart failure) (HCC)   Leukopenia   ESBL E. coli carrier   Aortic atherosclerosis   Plan:   Acute CHF (ADHF): Patient's volume status appears stable today; no clinical signs of congestion. Dyspnea and mild tachypnea persist, but no supplemental O? required at rest. BNP <100 on admission; pro-BNP slightly elevated. CHF syndrome remains on differential. Query for underlying lung disease; outpatient PFTs recommended. - Lasix held today; continue metoprolol   succinate 25 mg daily and spironolactone  12.5 mg daily. - Daily weights, strict I/Os, and fluid monitoring continue.  Blood Pressure / Hypertension: Chronic, stable; antihypertensives resumed. BP within target range.  Leukopenia / Anemia: Leukopenia persists; anemia improving (likely secondary to recent GI bleed). - CBC w/ diff and peripheral smear to trend counts.  Infection / ESBL E. coli carrier: Asymptomatic bacteriuria; ceftriaxone  discontinued. No further antibiotics required at this time.  Aortic aneurysm / Cerebrovascular disease: Ascending aortic aneurysm stable; yearly follow-up recommended. Aspirin  81 mg daily resumed.  BPH: outpatient flomax  and finasteride  continued.  Plan / New Actions Today:  Monitor volume status; continue strict I/Os and daily weights.  Hold IV diuretics unless signs of volume overload appear.  Continue beta-blocker and aldosterone antagonist.  Evaluate for underlying pulmonary  disease; consider outpatient PFTs.  Trend CBC for leukopenia and anemia.  No antibiotics for asymptomatic bacteriuria.  Continue outpatient follow-up for aortic aneurysm and Na monitoring post-SIADH. Best Practice: Diet: Cardiac diet IVF: Fluids: {Meds; iv fluids:31617}, Rate: {NAMES:3044014::None,*** cc/hr x *** hrs,*** cc bolus} VTE:  Code: {NAMES:3044014::Full,DNR,DNI,DNR/DNI,Comfort Care,Unknown}  Disposition planning: Therapy Recs: {NAMES:3044014::None,Pending,CIR,SNF,ALF,LTAC,Home Health}, DME: {Assistive Devices IFZ:80464} Family Contact: ***, {Family:[id number]::to be notified.} DISPO: Anticipated discharge {NAMES:3044014::today,tomorrow,in *** days} to {Discharge Destination:18313::Home} pending {BARRIERS TO DISCHARGE:24135}.  Signature:  Deloss Amico [name]  5:50 PM, 05/29/2024  On Call pager [phone]

## 2024-05-29 NOTE — Hospital Course (Addendum)
  Shortness of Breath (SOB) The patient was admitted with progressive shortness of breath. CHF exacerbation was initially considered, but BNP, recent echocardiogram, and exam findings did not indicate fluid overload. CT chest revealed bibasilar atelectasis, and symptoms were consistent with hyperreactive airways. He received bronchodilator therapy and was encouraged to ambulate and use incentive spirometry. Oxygenation improved, and he was discharged with an inhaler, education on its proper use, and referral to pulmonology for outpatient follow-up and PFTs.  Hypertension Blood pressure remained stable throughout hospitalization. His chronic antihypertensive regimen, including metoprolol  and spironolactone , was resumed without adjustment.  Leukopenia / Anemia Leukopenia persisted, likely reactive, and anemia continued to improve following recent GI bleeding. Peripheral smear was normal.  Infection / ESBL E. coli Carrier The patient had asymptomatic bacteriuria. Ceftriaxone  was discontinued, and no further antibiotics were required at discharge.  Aortic Aneurysm / Cerebrovascular Disease Ascending aortic aneurysm remained stable. Aspirin  81 mg daily was resumed, and yearly follow-up was recommended.  BPH Outpatient medications, including tamsulosin  and finasteride , were continued.  Medications on Discharge Symbicort inhaler, cetirizine , and Flonase .

## 2024-05-29 NOTE — Plan of Care (Signed)
   Problem: Education: Goal: Knowledge of General Education information will improve Description: Including pain rating scale, medication(s)/side effects and non-pharmacologic comfort measures Outcome: Progressing   Problem: Clinical Measurements: Goal: Respiratory complications will improve Outcome: Progressing   Problem: Activity: Goal: Risk for activity intolerance will decrease Outcome: Progressing

## 2024-05-30 ENCOUNTER — Encounter (HOSPITAL_COMMUNITY): Payer: Self-pay | Admitting: Internal Medicine

## 2024-05-30 ENCOUNTER — Other Ambulatory Visit (HOSPITAL_COMMUNITY): Payer: Self-pay

## 2024-05-30 ENCOUNTER — Ambulatory Visit: Admitting: Student

## 2024-05-30 DIAGNOSIS — I5032 Chronic diastolic (congestive) heart failure: Secondary | ICD-10-CM | POA: Diagnosis not present

## 2024-05-30 DIAGNOSIS — I7121 Aneurysm of the ascending aorta, without rupture: Secondary | ICD-10-CM | POA: Diagnosis not present

## 2024-05-30 DIAGNOSIS — J9601 Acute respiratory failure with hypoxia: Secondary | ICD-10-CM | POA: Diagnosis not present

## 2024-05-30 DIAGNOSIS — Z22359 Carrier of enterobacterales, unspecified: Secondary | ICD-10-CM

## 2024-05-30 DIAGNOSIS — Z7982 Long term (current) use of aspirin: Secondary | ICD-10-CM

## 2024-05-30 DIAGNOSIS — I110 Hypertensive heart disease with heart failure: Secondary | ICD-10-CM

## 2024-05-30 DIAGNOSIS — Z7951 Long term (current) use of inhaled steroids: Secondary | ICD-10-CM

## 2024-05-30 LAB — CBC WITH DIFFERENTIAL/PLATELET
Abs Immature Granulocytes: 0.04 K/uL (ref 0.00–0.07)
Basophils Absolute: 0.1 K/uL (ref 0.0–0.1)
Basophils Relative: 1 %
Eosinophils Absolute: 0.6 K/uL — ABNORMAL HIGH (ref 0.0–0.5)
Eosinophils Relative: 12 %
HCT: 35.2 % — ABNORMAL LOW (ref 39.0–52.0)
Hemoglobin: 11.9 g/dL — ABNORMAL LOW (ref 13.0–17.0)
Immature Granulocytes: 1 %
Lymphocytes Relative: 29 %
Lymphs Abs: 1.5 K/uL (ref 0.7–4.0)
MCH: 30.9 pg (ref 26.0–34.0)
MCHC: 33.8 g/dL (ref 30.0–36.0)
MCV: 91.4 fL (ref 80.0–100.0)
Monocytes Absolute: 0.9 K/uL (ref 0.1–1.0)
Monocytes Relative: 19 %
Neutro Abs: 1.9 K/uL (ref 1.7–7.7)
Neutrophils Relative %: 38 %
Platelets: 420 K/uL — ABNORMAL HIGH (ref 150–400)
RBC: 3.85 MIL/uL — ABNORMAL LOW (ref 4.22–5.81)
RDW: 13.2 % (ref 11.5–15.5)
WBC: 5 K/uL (ref 4.0–10.5)
nRBC: 0 % (ref 0.0–0.2)

## 2024-05-30 LAB — COMPREHENSIVE METABOLIC PANEL WITH GFR
ALT: 27 U/L (ref 0–44)
AST: 17 U/L (ref 15–41)
Albumin: 3.2 g/dL — ABNORMAL LOW (ref 3.5–5.0)
Alkaline Phosphatase: 54 U/L (ref 38–126)
Anion gap: 7 (ref 5–15)
BUN: 27 mg/dL — ABNORMAL HIGH (ref 8–23)
CO2: 28 mmol/L (ref 22–32)
Calcium: 8.2 mg/dL — ABNORMAL LOW (ref 8.9–10.3)
Chloride: 97 mmol/L — ABNORMAL LOW (ref 98–111)
Creatinine, Ser: 1.2 mg/dL (ref 0.61–1.24)
GFR, Estimated: >60 mL/min (ref >60)
Glucose, Bld: 97 mg/dL (ref 70–99)
Potassium: 3.8 mmol/L (ref 3.5–5.1)
Sodium: 132 mmol/L — ABNORMAL LOW (ref 135–145)
Total Bilirubin: 0.9 mg/dL (ref 0.0–1.2)
Total Protein: 5.8 g/dL — ABNORMAL LOW (ref 6.5–8.1)

## 2024-05-30 MED ORDER — FLUTICASONE PROPIONATE 50 MCG/ACT NA SUSP
1.0000 | Freq: Every day | NASAL | 0 refills | Status: AC
  Filled 2024-05-30: qty 16, 30d supply, fill #0

## 2024-05-30 MED ORDER — CETIRIZINE HCL 10 MG PO TABS
10.0000 mg | ORAL_TABLET | Freq: Every day | ORAL | 0 refills | Status: AC
Start: 2024-05-30 — End: 2025-05-30
  Filled 2024-05-30: qty 15, 15d supply, fill #0

## 2024-05-30 MED ORDER — BUDESONIDE-FORMOTEROL FUMARATE 80-4.5 MCG/ACT IN AERO
2.0000 | INHALATION_SPRAY | Freq: Two times a day (BID) | RESPIRATORY_TRACT | 0 refills | Status: DC | PRN
  Filled 2024-05-30: qty 10.2, 30d supply, fill #0

## 2024-05-30 MED ORDER — SPIRONOLACTONE 25 MG PO TABS
12.5000 mg | ORAL_TABLET | Freq: Every day | ORAL | 0 refills | Status: DC
  Filled 2024-05-30: qty 30, 60d supply, fill #0

## 2024-05-30 MED ORDER — SODIUM CHLORIDE 1 G PO TABS
2.0000 g | ORAL_TABLET | Freq: Two times a day (BID) | ORAL | 0 refills | Status: DC
  Filled 2024-05-30: qty 180, 45d supply, fill #0

## 2024-05-30 NOTE — Plan of Care (Signed)
   Problem: Education: Goal: Knowledge of General Education information will improve Description: Including pain rating scale, medication(s)/side effects and non-pharmacologic comfort measures Outcome: Progressing   Problem: Clinical Measurements: Goal: Respiratory complications will improve Outcome: Progressing   Problem: Activity: Goal: Risk for activity intolerance will decrease Outcome: Progressing

## 2024-05-30 NOTE — Discharge Instructions (Addendum)
 Dear Edward [name],  You were admitted to the hospital due to shortness of breath and mild low oxygen levels, which prompted evaluation for possible heart failure, pneumonia, and other causes. After careful assessment, we found that your lungs were working well overall, but you had atelectasis (partial lung collapse) and hyperactive airways contributing to your symptoms. Congestive heart failure exacerbation was less likely.  During your stay, you were treated with medications to help your breathing, monitored closely for fluid balance, and your other chronic conditions were reviewed. Your overall condition has improved, and you are stable to go home.  Follow-up with your PCP:  Please schedule an appointment within 1-2 weeks to review your recovery, medications, and ongoing health needs.  Pulmonology follow-up is recommended for lung function testing (PFTs) and ongoing management of airway hyperreactivity.  Home instructions:  Medications: Continue Symbicort, Ventolin  PRN, cetirizine , and Flonase  as prescribed.  - Continue metoprolol , spironolactone , aspirin , Flomax , and Finasteride . -Take your sodium tablets twice daily. Use incentive spirometer regularly to improve lung expansion and prevent atelectasis.  Activity: Resume light daily activities. Avoid strenuous activity until cleared by your doctor. Use inhalers before exertion if needed.  Diet: Follow a heart-healthy, low-sodium diet and maintain hydration. Include protein-rich foods to support your overall health.  Monitoring: Track your weight, breathing symptoms, and blood pressure. Seek medical attention for new or worsening shortness of breath, chest pain, or fever.  It was a pleasure to care for you during your hospital stay, and we look forward to your continued recovery and follow-up.

## 2024-05-30 NOTE — Evaluation (Signed)
 Physical Therapy Evaluation Patient Details Name: Edward [name] MRN: [id number] DOB: [birth year] Today's Date: 05/30/2024  History of Present Illness  The pt is a 73 yo male presenting 9/29 from urgent care with SOB x3 weeks. Admitted for acute decompensated heart failure and abnormal UA with presumed acute cystitis. PMH includes: HFpEF, bradycardia, aortic aneurysm, HTN, HLD, CVA s/p TPA in 2016, BPH, and 2 recent admissions.   Clinical Impression  Pt in bed upon arrival of PT, agreeable to evaluation at this time. 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 Pt lives with family who can assist after d/c. Pt able to complete all bed mobility and transfers without assist, demos limited power and endurance in BLE and poor endurance with gait. SpO2 stable on RA at rest (93-99%) with drop to 94% with ambulation. Will benefit from continued skilled PT to address deficits in endurance and dynamic stability as well as to complete stair training prior to d/c home but do not anticipate follow up PT needs.    SpO2 on RA at rest (supine) - 90-93% SpO2 on RA at rest (sitting) - 99-100% SpO2 on RA with gait - 88-96% (low of 88%, recovered to 100% after 10 sec seated rest)       If plan is discharge home, recommend the following: A little help with walking and/or transfers   Can travel by private vehicle        Equipment Recommendations Other (comment) (shower chair)  Recommendations for Other Services       Functional Status Assessment Patient has had a recent decline in their functional status and demonstrates the ability to make significant improvements in function in a reasonable and predictable amount of time.     Precautions / Restrictions Precautions Precautions: Fall Recall of Precautions/Restrictions: Intact Restrictions Weight Bearing Restrictions Per  Provider Order: No      Mobility  Bed Mobility Overal bed mobility: Modified Independent (increased time)                  Transfers Overall transfer level: Needs assistance Equipment used: None Transfers: Sit to/from Stand Sit to Stand: Supervision           General transfer comment: supervision with increased time to rise, able to complete without UE support    Ambulation/Gait Ambulation/Gait assistance: Contact guard assist Gait Distance (Feet): 200 Feet Assistive device: None (hallway rail) Gait Pattern/deviations: Step-through pattern, Staggering right, Drifts right/left Gait velocity: pt reports near baseline, slightly decreased     General Gait Details: pt with increased lateral sway and uneven gait but no overt LOB. prefers UE support on rail intermittently      Balance Overall balance assessment: Mild deficits observed, not formally tested                               Standardized Balance Assessment Standardized Balance Assessment : Dynamic Gait Index   Dynamic Gait Index Level Surface: Mild Impairment Change in Gait Speed: Mild Impairment Gait with Horizontal Head Turns: Mild Impairment Gait with Vertical Head Turns: Mild Impairment Gait and Pivot Turn: Mild Impairment Step Over Obstacle: Mild Impairment Step Around Obstacles: Normal Steps: Moderate Impairment Total Score: 16       Pertinent Vitals/Pain Pain Assessment Pain Assessment: No/denies pain    Home Living Family/patient expects to be  discharged to:: Private residence Living Arrangements: Spouse/significant other;Children;Other relatives Available Help at Discharge: Family;Available 24 hours/day Type of Home: House Home Access: Stairs to enter Entrance Stairs-Rails: Right;Left;Can reach both Entrance Stairs-Number of Steps: 2 Alternate Level Stairs-Number of Steps: 6 Home Layout: Multi-level (bed/bath down half-flight of stairs) Home Equipment:  (wife has a  walker)      Prior Function Prior Level of Function : Independent/Modified Independent;Driving;Working/employed             Mobility Comments: pt reports no consistent exercise, activity walking at work. 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     Extremity/Trunk Assessment   Upper Extremity Assessment Upper Extremity Assessment: Left hand dominant    Lower Extremity Assessment Lower Extremity Assessment: Overall WFL for tasks assessed (grossly 4+/5 to LE, limited power and endurance)    Cervical / Trunk Assessment Cervical / Trunk Assessment: Normal  Communication   Communication Communication: No apparent difficulties    Cognition Arousal: Alert Behavior During Therapy: WFL for tasks assessed/performed   PT - Cognitive impairments: No apparent impairments                         Following commands: Intact       Cueing Cueing Techniques: Verbal cues     General Comments General comments (skin integrity, edema, etc.): SpO2 90-97% on RA at rest in supine, 99-100% at rest in sitting. Pt then with increased SOB with gait, spO2 to 94% initially, possibly low of 88% on RA after ambulation but increased to 99% within 10 seconds of sitting down. reports chest tightness improved after ambulation    Exercises Other Exercises Other Exercises: sit-stand for 30 sec, completed 9.5   Assessment/Plan    PT Assessment Patient needs continued PT services  PT Problem List Decreased activity tolerance;Decreased balance;Decreased mobility;Cardiopulmonary status limiting activity       PT Treatment Interventions DME instruction;Gait training;Stair training;Functional mobility training;Therapeutic activities;Therapeutic exercise;Balance training;Patient/family education    PT Goals (Current goals can be found in  the Care Plan section)  Acute Rehab PT Goals Patient Stated Goal: to return to independence and working PT Goal Formulation: With patient Time For Goal Achievement: 06/13/24 Potential to Achieve Goals: Good    Frequency Min 2X/week        AM-PAC PT 6 Clicks Mobility  Outcome Measure Help needed turning from your back to your side while in a flat bed without using bedrails?: None Help needed moving from lying on your back to sitting on the side of a flat bed without using bedrails?: None Help needed moving to and from a bed to a chair (including a wheelchair)?: None Help needed standing up from a chair using your arms (e.g., wheelchair or bedside chair)?: None Help needed to walk in hospital room?: A Little Help needed climbing 3-5 steps with a railing? : A Little 6 Click Score: 22    End of Session Equipment Utilized During Treatment: Gait belt Activity Tolerance: Patient tolerated treatment well Patient left: in bed;with call bell/phone within reach Nurse Communication: Mobility status;Other (comment) (O2 sats) PT Visit Diagnosis: Unsteadiness on feet (R26.81);Difficulty in walking, not elsewhere classified (R26.2)    Time: 0930-1003 PT Time Calculation (min) (ACUTE ONLY): 33 min   Charges:   PT Evaluation $PT Eval Low Complexity: 1 Low PT Treatments $Therapeutic Exercise:  8-22 mins PT General Charges $$ ACUTE PT VISIT: 1 Visit         Izetta Call, PT, DPT   Acute Rehabilitation Department Office [phone] Secure Chat Communication Preferred  Izetta JULIANNA Call 05/30/2024, 10:28 AM

## 2024-05-30 NOTE — TOC Transition Note (Signed)
 Transition of Care [facility]) - Discharge Note   Patient Details  Name: Edward [name] MRN: [id number] Date of Birth: 1951-02-24  Transition of Care [facility]) CM/SW Contact:  Waddell Barnie Rama, RN Phone Number: 05/30/2024, 1:21 PM   Clinical Narrative:    For dc today,  no need for oxygen per saturations.  He has transport.           Patient Goals and CMS Choice            Discharge Placement                       Discharge Plan and Services Additional resources added to the After Visit Summary for                                       Social Drivers of Health (SDOH) Interventions SDOH Screenings   Food Insecurity: No Food Insecurity (05/28/2024)  Housing: Unknown (05/30/2024)  Transportation Needs: No Transportation Needs (05/28/2024)  Utilities: Not At Risk (05/28/2024)  Alcohol Screen: Low Risk  (05/30/2024)  Depression (PHQ2-9): Low Risk  (02/07/2023)  Financial Resource Strain: Low Risk  (05/30/2024)  Physical Activity: Sufficiently Active (01/19/2023)  Social Connections: Socially Integrated (05/28/2024)  Stress: No Stress Concern Present (01/19/2023)  Tobacco Use: Medium Risk (05/27/2024)     Readmission Risk Interventions    05/30/2024   10:38 AM  Readmission Risk Prevention Plan  Transportation Screening Complete  PCP or Specialist Appt within 5-7 Days Complete  Home Care Screening Complete  Medication Review (RN CM) Complete

## 2024-05-30 NOTE — TOC CM/SW Note (Addendum)
 Transition of Care [facility]) - Inpatient Brief Assessment   Patient Details  Name: Edward [name] MRN: [id number] Date of Birth: [birth year]  Transition of Care [facility]) CM/SW Contact:    Waddell Barnie Rama, RN Phone Number: 05/30/2024, 10:40 AM   Clinical Narrative: From home with spouse, has PCP is Elsie Savannah at the [facility] and insurance on file, states has no HH services in place at this time or DME at home.  States family member will transport them home at [employer] and family is support system, states gets medications from [facility] on [address].  Pta self ambulatory.   He will need his oxygen saturations checked to see if he will need home oxygen.  If so he does not have a preference of the DME agency.    Transition of Care Asessment: Insurance and Status: Insurance coverage has been reviewed Patient has primary care physician: Yes Home environment has been reviewed: home with wife , daughgter and SIL Prior level of function:: indep Prior/Current Home Services: No current home services Social Drivers of Health Review: SDOH reviewed no interventions necessary Readmission risk has been reviewed: Yes Transition of care needs: no transition of care needs at this time

## 2024-05-30 NOTE — Discharge Summary (Cosign Needed Addendum)
 Name: Edward [name] MRN: [id number] DOB: 06-Jan-1951 73 y.o. PCP: Norrine Sharper, MD  Date of Admission: 05/27/2024  3:38 PM Date of Discharge: 05/30/2024 Attending Physician: Dr. MICAEL Riis Winfrey  Discharge Diagnosis: 1. Principal Problem:   Acute respiratory failure with hypoxia (HCC) Active Problems:   Benign essential HTN   Ascending aortic aneurysm   Acute CHF (congestive heart failure) (HCC)   Leukopenia   ESBL E. coli carrier   Aortic atherosclerosis   Discharge Medications: Allergies as of 05/30/2024   No Known Allergies      Medication List     STOP taking these medications    ibuprofen  200 MG tablet Commonly known as: ADVIL    meloxicam  15 MG tablet Commonly known as: MOBIC        TAKE these medications    acetaminophen  650 MG CR tablet Commonly known as: TYLENOL  Take 1,300 mg by mouth every 8 (eight) hours as needed for pain.   albuterol  108 (90 Base) MCG/ACT inhaler Commonly known as: VENTOLIN  HFA Inhale 2 puffs into the lungs every 4 (four) hours as needed for wheezing or shortness of breath.   aspirin  EC 81 MG tablet Take 81 mg by mouth at bedtime. Swallow whole.   budesonide-formoterol 80-4.5 MCG/ACT inhaler Commonly known as: Symbicort Inhale 2 puffs into the lungs 2 (two) times daily as needed for up to 1 dose.   cetirizine  10 MG tablet Commonly known as: ZyrTEC  Allergy Take 1 tablet (10 mg total) by mouth at bedtime.   CoQ-10 100 MG Caps Take 1 capsule by mouth in the morning and at bedtime.   finasteride  5 MG tablet Commonly known as: PROSCAR  TAKE 1 TABLET (5 MG TOTAL) BY MOUTH DAILY.   fluticasone  50 MCG/ACT nasal spray Commonly known as: FLONASE  Place 1 spray into both nostrils daily.   metoprolol  succinate 25 MG 24 hr tablet Commonly known as: TOPROL -XL TAKE 1 TABLET (25 MG) BY MOUTH DAILY. APPOINTMENT NEEDED FOR CONTINUATION OF REFILLS-1ST ATTEMPT What changed: See the new instructions.   multivitamin with minerals  Tabs tablet Take 1 tablet by mouth daily.   N-Acetyl Cysteine 600 MG Tabs Generic drug: Acetylcysteine (Nutrient) Take 1 tablet by mouth at bedtime.   OVER THE COUNTER MEDICATION Take 1 capsule by mouth at bedtime. PQQ: Pyrroloquinoline Quinone   pantoprazole  40 MG tablet Commonly known as: Protonix  Take 1 tablet (40 mg total) by mouth 2 (two) times daily. What changed: when to take this   RA Cranberry 500 MG Caps Generic drug: Cranberry Take 1 capsule by mouth in the morning and at bedtime.   rosuvastatin  10 MG tablet Commonly known as: CRESTOR  TAKE 1 TABLET BY MOUTH EVERY DAY What changed: when to take this   sodium chloride  1 g tablet Take 2 tablets (2 g total) by mouth 2 (two) times daily with a meal. What changed: when to take this   spironolactone  25 MG tablet Commonly known as: ALDACTONE  Take 0.5 tablets (12.5 mg total) by mouth daily. Start taking on: May 31, 2024   tamsulosin  0.4 MG Caps capsule Commonly known as: FLOMAX  TAKE 1 CAPSULE BY MOUTH EVERY DAY What changed: when to take this        Disposition and follow-up:    Follow-up  Shortness of Breath (SOB) / Hyperreactive Airways - Review inhaler use and adherence (Symbicort). - Pulmonology referral and PFT. - Monitor for recurrence of SOB or need for additional therapy. - Continue incentive spirometry for atelectasis - Home health respiratory care/PT The  patient experiences shortness of breath and lung atelectasis and requires support.  Close supervision and monitoring are necessary to detect any drops in oxygen saturation during activity. Assistance is also needed for incentive spirometry and maintaining pulmonary hygiene.   Hypertension - Monitor BP at home and in clinic. - Adjust antihypertensives (metoprolol , spironolactone ) as needed.  Leukopenia / Anemia - CBC to monitor trends.  Infection / ESBL E. coli Carrier - Monitor for symptoms of UTI.  Aortic Aneurysm / Cerebrovascular  Disease - Yearly imaging for aneurysm. - Continue aspirin  81 mg daily.  2.  Labs / imaging needed at time of follow-up: CBC, BMP, PFT 3.  Pending labs/ test needing follow-up: N/A  Follow-up Appointments:  Follow-up Information     D'Mello, Rosalyn, DO Follow up on 06/11/2024.   Specialty: Internal Medicine Why: 3:15 for hospital follow up apt, please arrive at 3 pm, Contact information: [address] [place] [address] [phone]         [facility]. Go in 15 day(s).   Specialty: Cardiology Why: Hospital follow up 06/14/2024 @ 2 pm PLEASE bring a current medication l;ist to appointment FREE valet parking, Entrance C, off [employer] [facility] information: [address] [place]  [phone]                 Hospital Course by problem list: Alm CROME. Siegert, a 73 year old male with HFpEF (EF 45-50%), bradycardia, aortic aneurysm, hypertension, hyperlipidemia, prior CVA s/p TPA in 2016, BPH, and two recent admissions this month for SIADH with severe hyponatremia, acute pancreatitis complicated by E. coli UTI, and hematochezia from diverticulosis, presents as a transfer from an outside hospital for acute decompensated heart failure..    Shortness of Breath (SOB) The patient was admitted with progressive shortness of breath. CHF exacerbation was initially considered, but BNP, recent echocardiogram, and exam findings did not indicate fluid overload. CT chest revealed bibasilar atelectasis, and symptoms were consistent with hyperreactive airways. He received bronchodilator therapy and was encouraged to ambulate and use incentive spirometry. Oxygenation improved, and he was discharged with an inhaler, education on its proper use, and referral to pulmonology for outpatient follow-up and PFTs.  Hypertension Blood pressure remained stable  throughout hospitalization. His chronic antihypertensive regimen, including metoprolol  and spironolactone , was resumed without adjustment.  Leukopenia / Anemia Leukopenia persisted, likely reactive, and anemia continued to improve following recent GI bleeding. Peripheral smear was normal.  Infection / ESBL E. coli Carrier The patient had asymptomatic bacteriuria. Ceftriaxone  was discontinued, and no further antibiotics were required at discharge.  Aortic Aneurysm / Cerebrovascular Disease Ascending aortic aneurysm remained stable. Aspirin  81 mg daily was resumed, and yearly follow-up was recommended.  BPH Outpatient medications, including tamsulosin  and finasteride , were continued.  Medications on Discharge Symbicort inhaler, cetirizine , and Flonase .   Stable chronic medical conditions: Essential hypertension Ascending aortic aneurysm Aortic atherosclerosis  Subjective Patient is sitting comfortably, doing well, with no shortness of breath, swelling, chest pain, urinary symptoms, fever, or chills. Dry cough present but stable  Discharge Exam:   BP 106/73 (BP Location: Left Arm)   Pulse 70   Temp 97.6 F (36.4 C) (Oral)   Resp 20   Ht 6' (1.829 m)   Wt 90.3 kg   SpO2 96%   BMI 27.00 kg/m  General: Well-appearing overall Cardiac: Heart rate normal, rhythm regular; jugular venous pulsations visible above clavicle at 30; radial pulses  strong Respiratory: Breathing mildly increased; speaking in short sentences; lungs clear to auscultation but with slightly decreased air entry Skin: Warm and dry Abdomen: Soft, non-tender Neurologic / Mental Status: Alert and oriented; speech normal; no facial asymmetry; moves all extremities normally   Pertinent Labs, Studies, and Procedures:     Latest Ref Rng & Units 05/30/2024    3:08 AM 05/29/2024    5:57 PM 05/27/2024    4:10 PM  CBC  WBC 4.0 - 10.5 K/uL 5.0  5.0  3.2   Hemoglobin 13.0 - 17.0 g/dL 88.0  88.2  88.2   Hematocrit 39.0 -  52.0 % 35.2  35.5  34.6   Platelets 150 - 400 K/uL 420  473  373        Latest Ref Rng & Units 05/30/2024    3:08 AM 05/29/2024   10:59 AM 05/27/2024    4:10 PM  CMP  Glucose 70 - 99 mg/dL 97  85  879   BUN 8 - 23 mg/dL 27  23  9    Creatinine 0.61 - 1.24 mg/dL 8.79  8.76  9.03   Sodium 135 - 145 mmol/L 132  135  134   Potassium 3.5 - 5.1 mmol/L 3.8  3.9  4.7   Chloride 98 - 111 mmol/L 97  95  98   CO2 22 - 32 mmol/L 28  30  23    Calcium  8.9 - 10.3 mg/dL 8.2  8.5  9.5   Total Protein 6.5 - 8.1 g/dL 5.8     Total Bilirubin 0.0 - 1.2 mg/dL 0.9     Alkaline Phos 38 - 126 U/L 54     AST 15 - 41 U/L 17     ALT 0 - 44 U/L 27       CT Angio Chest PE W and/or Wo Contrast Result Date: 05/27/2024 CLINICAL DATA:  Pulmonary embolism suspected, high probability. Worsening shortness of breath and coughing. EXAM: CT ANGIOGRAPHY CHEST WITH CONTRAST TECHNIQUE: Multidetector CT imaging of the chest was performed using the standard protocol during bolus administration of intravenous contrast. Multiplanar CT image reconstructions and MIPs were obtained to evaluate the vascular anatomy. RADIATION DOSE REDUCTION: This exam was performed according to the departmental dose-optimization program which includes automated exposure control, adjustment of the mA and/or kV according to patient size and/or use of iterative reconstruction technique. CONTRAST:  75mL OMNIPAQUE  IOHEXOL  350 MG/ML SOLN COMPARISON:  05/16/2024. FINDINGS: Cardiovascular: The heart is enlarged and there is a trace pericardial effusion. Scattered coronary artery calcifications are noted. There is atherosclerotic calcification of the aorta with aneurysmal dilatation of the ascending aorta measuring 4.1 cm. The pulmonary trunk is normal in caliber. No evidence of pulmonary embolism is seen. Mediastinum/Nodes: No mediastinal, hilar, or axillary lymphadenopathy. The thyroid  gland, trachea, and esophagus are within normal limits. There is a small hiatal  hernia. Lungs/Pleura: Pleuroparenchymal scarring is present at the lung apices bilaterally. There is bibasilar atelectasis with consolidation in the lower lobes bilaterally. No effusion or pneumothorax is seen. The previously described pulmonary nodules are not seen due to superimposed atelectasis or infiltrate. Upper Abdomen: No acute abnormality. Musculoskeletal: Degenerative changes are present in the thoracic spine. No acute osseous abnormality. Review of the MIP images confirms the above findings. IMPRESSION: 1. No evidence of pulmonary embolism. 2. Bibasilar atelectasis with consolidation in the lower lobes bilaterally, possible atelectasis or infiltrate increased from prior exams. 3. Coronary artery calcifications. 4. Small hiatal hernia. 5. Aortic atherosclerosis with aneurysmal dilatation of the  ascending aorta measuring 4.1 cm. Recommend annual imaging followup by CTA or MRA. This recommendation follows 2010 ACCF/AHA/AATS/ACR/ASA/SCA/SCAI/SIR/STS/SVM Guidelines for the Diagnosis and Management of Patients with Thoracic Aortic Disease. Circulation. 2010; 121: Z733-z630. Aortic aneurysm NOS (ICD10-I71.9) . Electronically Signed   By: Leita Birmingham M.D.   On: 05/27/2024 18:29   DG Chest 2 View Result Date: 05/27/2024 CLINICAL DATA:  Shortness of breath x3 weeks. EXAM: DG CHEST 2V COMPARISON:  May 16, 2024 FINDINGS: The cardiac silhouette is mildly enlarged and unchanged in size. Low lung volumes are noted. Mild, stable areas of linear scarring and/or atelectasis are seen within the bilateral lung bases. No pleural effusion or pneumothorax is identified. The visualized skeletal structures are unremarkable. IMPRESSION: Low lung volumes with mild bibasilar linear scarring and/or atelectasis. Electronically Signed   By: Suzen Dials M.D.   On: 05/27/2024 14:22     Discharge Instructions: Dear Mr. Saathvik, Every were admitted to the hospital due to shortness of breath and mild low oxygen levels,  which prompted evaluation for possible heart failure, pneumonia, and other causes. After careful assessment, we found that your lungs were working well overall, but you had atelectasis (partial lung collapse) and hyperactive airways contributing to your symptoms. Congestive heart failure exacerbation was less likely.  During your stay, you were treated with medications to help your breathing, monitored closely for fluid balance, and your other chronic conditions were reviewed. Your overall condition has improved, and you are stable to go home.  Follow-up with your PCP:  Please schedule an appointment within 1-2 weeks to review your recovery, medications, and ongoing health needs.  Pulmonology follow-up is recommended for lung function testing (PFTs) and ongoing management of airway hyperreactivity.  Home instructions:  Medications: Continue Symbicort, Ventolin  PRN, cetirizine , and Flonase  as prescribed.  - Continue metoprolol , spironolactone , aspirin , Flomax , and Finasteride . -Take your sodium tablets twice daily. Use incentive spirometer regularly to improve lung expansion and prevent atelectasis.  Activity: Resume light daily activities. Avoid strenuous activity until cleared by your doctor. Use inhalers before exertion if needed.  Diet: Follow a heart-healthy, low-sodium diet and maintain hydration. Include protein-rich foods to support your overall health.  Monitoring: Track your weight, breathing symptoms, and blood pressure. Seek medical attention for new or worsening shortness of breath, chest pain, or fever.  It was a pleasure to care for you during your hospital stay, and we look forward to your continued recovery and follow-up. Discharge Instructions     Call MD for:  difficulty breathing, headache or visual disturbances   Complete by: As directed    Call MD for:  persistant dizziness or light-headedness   Complete by: As directed    Increase activity slowly   Complete by: As  directed        Signed: Bernadine Manos, MD 05/30/2024, 6:40 PM

## 2024-05-30 NOTE — Plan of Care (Signed)
  Problem: Education: Goal: Knowledge of General Education information will improve Description: Including pain rating scale, medication(s)/side effects and non-pharmacologic comfort measures 05/30/2024 1326 by Berkeley Verdie BRAVO, RN Outcome: Adequate for Discharge 05/30/2024 1031 by Berkeley Verdie BRAVO, RN Outcome: Progressing   Problem: Health Behavior/Discharge Planning: Goal: Ability to manage health-related needs will improve Outcome: Adequate for Discharge   Problem: Clinical Measurements: Goal: Ability to maintain clinical measurements within normal limits will improve Outcome: Adequate for Discharge Goal: Will remain free from infection Outcome: Adequate for Discharge Goal: Diagnostic test results will improve Outcome: Adequate for Discharge Goal: Respiratory complications will improve 05/30/2024 1326 by Berkeley Verdie BRAVO, RN Outcome: Adequate for Discharge 05/30/2024 1031 by Berkeley Verdie BRAVO, RN Outcome: Progressing Goal: Cardiovascular complication will be avoided Outcome: Adequate for Discharge   Problem: Activity: Goal: Risk for activity intolerance will decrease 05/30/2024 1326 by Berkeley Verdie BRAVO, RN Outcome: Adequate for Discharge 05/30/2024 1031 by Berkeley Verdie BRAVO, RN Outcome: Progressing   Problem: Nutrition: Goal: Adequate nutrition will be maintained Outcome: Adequate for Discharge   Problem: Coping: Goal: Level of anxiety will decrease Outcome: Adequate for Discharge   Problem: Elimination: Goal: Will not experience complications related to bowel motility Outcome: Adequate for Discharge Goal: Will not experience complications related to urinary retention Outcome: Adequate for Discharge   Problem: Pain Managment: Goal: General experience of comfort will improve and/or be controlled Outcome: Adequate for Discharge   Problem: Safety: Goal: Ability to remain free from injury will improve Outcome: Adequate for Discharge   Problem: Skin Integrity: Goal: Risk for  impaired skin integrity will decrease Outcome: Adequate for Discharge

## 2024-05-31 ENCOUNTER — Telehealth: Payer: Self-pay

## 2024-05-31 ENCOUNTER — Other Ambulatory Visit (HOSPITAL_COMMUNITY): Payer: Self-pay

## 2024-05-31 ENCOUNTER — Encounter (HOSPITAL_COMMUNITY): Payer: Self-pay

## 2024-05-31 DIAGNOSIS — I728 Aneurysm of other specified arteries: Secondary | ICD-10-CM

## 2024-05-31 NOTE — Transitions of Care (Post Inpatient/ED Visit) (Signed)
 05/31/2024  Name: Edward [name] MRN: [id number] DOB: May 28, 1951  Today's TOC FU Call Status: Today's TOC FU Call Status:: Successful TOC FU Call Completed TOC FU Call Complete Date: 05/31/24 Patient's Name and Date of Birth confirmed.  Transition Care Management Follow-up Telephone Call Date of Discharge: 05/30/24 Discharge Facility: [name]) Type of Discharge: Inpatient Admission Primary Inpatient Discharge Diagnosis:: ARF How have you been since you were released from the hospital?: Better Any questions or concerns?: No  Items Reviewed: Did you receive and understand the discharge instructions provided?: Yes Medications obtained,verified, and reconciled?: Yes (Medications Reviewed) Any new allergies since your discharge?: No Dietary orders reviewed?: Yes Type of Diet Ordered:: Low Sodium Heart Healthy Do you have support at home?: Yes People in Home [RPT]: spouse Name of Support/Comfort Primary Source: [name]  Medications Reviewed Today: Medications Reviewed Today     Reviewed by Moises Reusing, RN (Case Manager) on 05/31/24 at 1001  Med List Status: <None>   Medication Order Taking? Sig Documenting Provider Last Dose Status Informant  acetaminophen  (TYLENOL ) 650 MG CR tablet [id number] No Take 1,300 mg by mouth every 8 (eight) hours as needed for pain. [provider] Past Month Active Self, Pharmacy Records  albuterol  (VENTOLIN  HFA) 108 (90 Base) MCG/ACT inhaler [id number] No Inhale 2 puffs into the lungs every 4 (four) hours as needed for wheezing or shortness of breath. Edgardo Pontiff, DO Past Week Active Self, Pharmacy Records  aspirin  EC 81 MG tablet [id number] No Take 81 mg by mouth at bedtime. Swallow whole. [provider] Past Week Active Self, Pharmacy Records  budesonide-formoterol [facility]) 80-4.5 MCG/ACT inhaler [id number]  Inhale 2 puffs into the lungs 2 (two) times daily as needed for up to 1 dose. Azadegan, Maryam, MD  Active    cetirizine  (ZYRTEC  ALLERGY) 10 MG tablet [id number]  Take 1 tablet (10 mg total) by mouth at bedtime. Azadegan, Maryam, MD  Active   Coenzyme Q10 (COQ-10) 100 MG CAPS [id number] No Take 1 capsule by mouth in the morning and at bedtime. [provider] Past Week Active Self, Pharmacy Records  finasteride  (PROSCAR ) 5 MG tablet [id number] No TAKE 1 TABLET (5 MG TOTAL) BY MOUTH DAILY. Arellano Zameza, Priscila, MD Past Week Active Self, Pharmacy Records  fluticasone  (FLONASE ) 50 MCG/ACT nasal spray [id number]  Place 1 spray into both nostrils daily. Azadegan, Maryam, MD  Active   metoprolol  succinate (TOPROL -XL) 25 MG 24 hr tablet [id number] No TAKE 1 TABLET (25 MG) BY MOUTH DAILY. APPOINTMENT NEEDED FOR CONTINUATION OF REFILLS-1ST ATTEMPT  Patient taking differently: Take 25 mg by mouth at bedtime.   Shlomo Wilbert SAUNDERS, MD 05/26/2024 Active Self, Pharmacy Records  Multiple Vitamin (MULTIVITAMIN WITH MINERALS) TABS tablet [id number] No Take 1 tablet by mouth daily. [provider] Past Week Active Self, Pharmacy Records  N-ACETYL CYSTEINE 600 MG TABS [id number] No Take 1 tablet by mouth at bedtime. [provider] Past Week Active Self, Pharmacy Records  OVER THE COUNTER MEDICATION [id number] No Take 1 capsule by mouth at bedtime. PQQ: Pyrroloquinoline Quinone [provider] Past Week Active Self, Pharmacy Records  pantoprazole  (PROTONIX ) 40 MG tablet [id number] No Take 1 tablet (40 mg total) by mouth 2 (two) times daily.  Patient taking differently: Take 40 mg by mouth daily.   Waymond Cart, MD Past Week Active Self, Pharmacy Records  RA CRANBERRY 500 MG CAPS [id number] No Take 1 capsule by mouth in the morning and at bedtime. [provider] Past Week Active Self,  Pharmacy Records  rosuvastatin  (CRESTOR ) 10 MG tablet [id number] No TAKE 1 TABLET BY MOUTH EVERY DAY  Patient taking differently: Take 10 mg by mouth 4 (four) times a week.   Arellano Zameza, Priscila, MD Past  Week Active Self, Pharmacy Records  sodium chloride  1 g tablet [id number]  Take 2 tablets (2 g total) by mouth 2 (two) times daily with a meal. Azadegan, Maryam, MD  Active   spironolactone  (ALDACTONE ) 25 MG tablet [id number]  Take 0.5 tablets (12.5 mg total) by mouth daily. Azadegan, Maryam, MD  Active   tamsulosin  (FLOMAX ) 0.4 MG CAPS capsule [id number] No TAKE 1 CAPSULE BY MOUTH EVERY DAY  Patient taking differently: Take 0.4 mg by mouth at bedtime.   Arellano Zameza, Priscila, MD Past Week Active Self, Pharmacy Records            Home Care and Equipment/Supplies: Were Home Health Services Ordered?: NA Any new equipment or medical supplies ordered?: NA  Functional Questionnaire: Do you need assistance with bathing/showering or dressing?: No Do you need assistance with meal preparation?: No Do you need assistance with eating?: No Do you have difficulty maintaining continence: No Do you need assistance with getting out of bed/getting out of a chair/moving?: No Do you have difficulty managing or taking your medications?: No  Follow up appointments reviewed: PCP Follow-up appointment confirmed?: Yes Date of PCP follow-up appointment?: 06/11/24 Follow[facility]up Provider[facility] Follow-up appointment confirmed?: Yes Date of Specialist follow-up appointment?: 06/14/24 Follow-Up Specialty Provider:: Cardiology Do you need transportation to your follow-up appointment?: No Do you understand care options if your condition(s) worsen?: Yes-patient verbalized understanding  SDOH Interventions Today    Flowsheet Row Most Recent Value  SDOH Interventions   Food Insecurity Interventions Intervention Not Indicated  Housing Interventions Intervention Not Indicated  Transportation Interventions Intervention Not Indicated  Utilities Interventions Intervention Not Indicated    Medford Balboa, BSN, RN [place]  VBCI - [facility] RN Care  Manager [phone]

## 2024-06-03 ENCOUNTER — Emergency Department (HOSPITAL_COMMUNITY)

## 2024-06-03 ENCOUNTER — Emergency Department (HOSPITAL_COMMUNITY)
Admission: EM | Admit: 2024-06-03 | Discharge: 2024-06-03 | Disposition: A | Discharge status: Home / Self Care | Attending: Emergency Medicine | Admitting: Emergency Medicine

## 2024-06-03 ENCOUNTER — Encounter (HOSPITAL_COMMUNITY): Payer: Self-pay

## 2024-06-03 ENCOUNTER — Telehealth: Payer: Self-pay | Admitting: Physician Assistant

## 2024-06-03 ENCOUNTER — Other Ambulatory Visit: Payer: Self-pay

## 2024-06-03 DIAGNOSIS — I509 Heart failure, unspecified: Secondary | ICD-10-CM | POA: Insufficient documentation

## 2024-06-03 DIAGNOSIS — J90 Pleural effusion, not elsewhere classified: Secondary | ICD-10-CM | POA: Diagnosis not present

## 2024-06-03 DIAGNOSIS — Z7982 Long term (current) use of aspirin: Secondary | ICD-10-CM | POA: Diagnosis not present

## 2024-06-03 DIAGNOSIS — R0602 Shortness of breath: Secondary | ICD-10-CM | POA: Insufficient documentation

## 2024-06-03 DIAGNOSIS — I700 Atherosclerosis of aorta: Secondary | ICD-10-CM | POA: Diagnosis not present

## 2024-06-03 DIAGNOSIS — Z8673 Personal history of transient ischemic attack (TIA), and cerebral infarction without residual deficits: Secondary | ICD-10-CM | POA: Insufficient documentation

## 2024-06-03 DIAGNOSIS — R918 Other nonspecific abnormal finding of lung field: Secondary | ICD-10-CM | POA: Diagnosis not present

## 2024-06-03 DIAGNOSIS — R0600 Dyspnea, unspecified: Secondary | ICD-10-CM

## 2024-06-03 LAB — COMPREHENSIVE METABOLIC PANEL WITH GFR
ALT: 33 U/L (ref 0–44)
AST: 26 U/L (ref 15–41)
Albumin: 3.2 g/dL — ABNORMAL LOW (ref 3.5–5.0)
Alkaline Phosphatase: 50 U/L (ref 38–126)
Anion gap: 8 (ref 5–15)
BUN: 14 mg/dL (ref 8–23)
CO2: 26 mmol/L (ref 22–32)
Calcium: 8.5 mg/dL — ABNORMAL LOW (ref 8.9–10.3)
Chloride: 100 mmol/L (ref 98–111)
Creatinine, Ser: 1.04 mg/dL (ref 0.61–1.24)
GFR, Estimated: >60 mL/min (ref >60)
Glucose, Bld: 93 mg/dL (ref 70–99)
Potassium: 4 mmol/L (ref 3.5–5.1)
Sodium: 134 mmol/L — ABNORMAL LOW (ref 135–145)
Total Bilirubin: 0.8 mg/dL (ref 0.0–1.2)
Total Protein: 6 g/dL — ABNORMAL LOW (ref 6.5–8.1)

## 2024-06-03 LAB — CBC WITH DIFFERENTIAL/PLATELET
Abs Immature Granulocytes: 0.12 K/uL — ABNORMAL HIGH (ref 0.00–0.07)
Basophils Absolute: 0.1 K/uL (ref 0.0–0.1)
Basophils Relative: 1 %
Eosinophils Absolute: 0.3 K/uL (ref 0.0–0.5)
Eosinophils Relative: 5 %
HCT: 34.6 % — ABNORMAL LOW (ref 39.0–52.0)
Hemoglobin: 11.2 g/dL — ABNORMAL LOW (ref 13.0–17.0)
Immature Granulocytes: 2 %
Lymphocytes Relative: 18 %
Lymphs Abs: 1 K/uL (ref 0.7–4.0)
MCH: 30.2 pg (ref 26.0–34.0)
MCHC: 32.4 g/dL (ref 30.0–36.0)
MCV: 93.3 fL (ref 80.0–100.0)
Monocytes Absolute: 0.8 K/uL (ref 0.1–1.0)
Monocytes Relative: 13 %
Neutro Abs: 3.4 K/uL (ref 1.7–7.7)
Neutrophils Relative %: 61 %
Platelets: 358 K/uL (ref 150–400)
RBC: 3.71 MIL/uL — ABNORMAL LOW (ref 4.22–5.81)
RDW: 12.8 % (ref 11.5–15.5)
WBC: 5.7 K/uL (ref 4.0–10.5)
nRBC: 0 % (ref 0.0–0.2)

## 2024-06-03 LAB — RESP PANEL BY RT-PCR (RSV, FLU A&B, COVID)  RVPGX2
Influenza A by PCR: NEGATIVE
Influenza B by PCR: NEGATIVE
Resp Syncytial Virus by PCR: NEGATIVE
SARS Coronavirus 2 by RT PCR: NEGATIVE

## 2024-06-03 LAB — BRAIN NATRIURETIC PEPTIDE: B Natriuretic Peptide: 75.9 pg/mL (ref 0.0–100.0)

## 2024-06-03 LAB — TROPONIN I (HIGH SENSITIVITY)
Troponin I (High Sensitivity): 6 ng/L (ref <18)
Troponin I (High Sensitivity): 6 ng/L (ref <18)

## 2024-06-03 LAB — I-STAT CG4 LACTIC ACID, ED: Lactic Acid, Venous: 1.3 mmol/L (ref 0.5–1.9)

## 2024-06-03 MED ORDER — FUROSEMIDE 10 MG/ML IJ SOLN
40.0000 mg | Freq: Once | INTRAMUSCULAR | Status: AC
  Administered 2024-06-03: 40 mg via INTRAVENOUS
  Filled 2024-06-03: qty 4

## 2024-06-03 MED ORDER — IPRATROPIUM-ALBUTEROL 0.5-2.5 (3) MG/3ML IN SOLN
3.0000 mL | Freq: Once | RESPIRATORY_TRACT | Status: AC
  Administered 2024-06-03: 3 mL via RESPIRATORY_TRACT
  Filled 2024-06-03: qty 3

## 2024-06-03 MED ORDER — IOHEXOL 350 MG/ML SOLN
75.0000 mL | Freq: Once | INTRAVENOUS | Status: AC | PRN
  Administered 2024-06-03: 75 mL via INTRAVENOUS

## 2024-06-03 NOTE — Telephone Encounter (Signed)
    PMH: HFpEF (EF 45-50%; G1DD in 08/2023), bradycardia, aortic aneurysm, HTN, HLD, CVA s/p TPA in 2016, BPH, and multiple ER visits and admissions in September (SIADH w/ severe hypoNa 115 and acute pancreatitis c/b E. Coli UTI from 9/8-9/13, and hematochezia from diverticulosis from 9/17-9/19).  Pt admitted from 09/28-10/01 for acute resp failure with hypoxia.  He has been taking salt tablets for about 3 weeks, for a sodium level of 116. His Na at d/c was 132.   At his last d/c, he was on NaCl 1 gm, 2 tabs bid w/ meals.  During that admission, he was given IV Lasix for volume overload, but was not on Lasix at discharge.  He was started on spironolactone  12.5 mg daily.  EF in January was 45-50%.  Since d/c, he has gained several pounds, and had increasing dyspnea on exertion.  They told him to decrease fluid intake, he says he has decreased his fluid intake from 64 ounces a day down to 32 ounces or so.  He has not seen cardiology since an office visit with Dr. Shlomo in May, but called our office to see if we could help.  After discussing the situation with him and reviewing his records, I am not comfortable just prescribing him Lasix.  I am concerned that giving him an oral diuretic could drop his sodium level to dangerous levels again.  I requested he come to the ER for evaluation.  He said he would do so.  Shona Shad, PA-C 06/03/2024 10:40 AM

## 2024-06-03 NOTE — ED Notes (Signed)
 Reports 3-4lb weight gain

## 2024-06-03 NOTE — ED Triage Notes (Signed)
 States he has a history of CHF was called for his MD to come to the ED for further eval. C/o sob onset last pm

## 2024-06-03 NOTE — Discharge Instructions (Signed)
 Contact a health care provider if: Your condition does not improve as soon as expected. You have a hard time doing your normal activities, even after you rest. You have new symptoms. You cannot walk up stairs or exercise the way that you normally do. Get help right away if: Your shortness of breath gets worse. You have shortness of breath when you are resting. You feel light-headed or you faint. You have a cough that is not controlled with medicines. You cough up blood. You have pain with breathing. You have pain in your chest, arms, shoulders, or abdomen. You have a fever. These symptoms may be an emergency. Get help right away. Call 911. Do not wait to see if the symptoms will go away. Do not drive yourself to the hospital.

## 2024-06-03 NOTE — ED Notes (Signed)
 Patient discharged to home with family. Patient given written and oral discharge instructions. Patient verbalized understanding. Patient taken to private car via wheelchair. Patient breathing without difficulty. Patient VS stable. Patient and family denies questions, concern or needs and this time.

## 2024-06-03 NOTE — ED Triage Notes (Signed)
 Reports worsening shortness of breath that started yesterday.  Was just discharged on Wednesday.  Reports can't lie flat and having difficulty sleeping due to trouble breathing.  Patient has to stop speaking to take a breath.

## 2024-06-03 NOTE — ED Provider Notes (Signed)
 [place] AT [facility] Provider Note   CSN: [id number] Arrival date & time: 06/03/24  1203     Patient presents with: Shortness of Breath  HPI Edward [name] is a 73 y.o. male with h/o stroke, CHF, cardiomyopathy, dilatation of the aorta, recent admission for CHF exacerbation presenting for shortness of breath.  He states he was discharged last Wednesday.  Shortness of breath started 2 days ago.  Is worse with exertion.  States he cannot lie flat at night using multiple pillows.  Also reports some chest tightness in the center of his chest and states that this moment it is a little tight.  He reports that he has been taking salt tablets due to low sodium levels in the last 3 weeks.  Has not been as prescribed p.o. diuretics.  Denies calf tenderness or swelling.  Denies any lower extremity edema.  Also endorses nonproductive cough but no fever.    Shortness of Breath      Prior to Admission medications   Medication Sig Start Date End Date Taking? Authorizing Provider  acetaminophen  (TYLENOL ) 650 MG CR tablet Take 1,300 mg by mouth every 8 (eight) hours as needed for pain.   Yes [provider]  albuterol  (VENTOLIN  HFA) 108 (90 Base) MCG/ACT inhaler Inhale 2 puffs into the lungs every 4 (four) hours as needed for wheezing or shortness of breath. 05/27/24  Yes Edgardo Pontiff, DO  aspirin  EC 81 MG tablet Take 81 mg by mouth at bedtime. Swallow whole.   Yes [provider]  budesonide-formoterol (SYMBICORT) 80-4.5 MCG/ACT inhaler Inhale 2 puffs into the lungs 2 (two) times daily as needed for up to 1 dose. 05/30/24  Yes Azadegan, Maryam, MD  cetirizine  (ZYRTEC  ALLERGY) 10 MG tablet Take 1 tablet (10 mg total) by mouth at bedtime. 05/30/24 05/30/25 Yes Azadegan, Maryam, MD  Coenzyme Q10 (COQ-10) 100 MG CAPS Take 1 capsule by mouth in the morning and at bedtime. 11/02/21  Yes [provider]  finasteride  (PROSCAR ) 5 MG tablet TAKE 1 TABLET (5  MG TOTAL) BY MOUTH DAILY. 01/09/24 01/08/25 Yes Arellano Zameza, Priscila, MD  fluticasone  (FLONASE ) 50 MCG/ACT nasal spray Place 1 spray into both nostrils daily. 05/30/24 06/29/24 Yes Azadegan, Maryam, MD  metoprolol  succinate (TOPROL -XL) 25 MG 24 hr tablet TAKE 1 TABLET (25 MG) BY MOUTH DAILY. APPOINTMENT NEEDED FOR CONTINUATION OF REFILLS-1ST ATTEMPT Patient taking differently: Take 25 mg by mouth at bedtime. 01/24/24  Yes Turner, Wilbert SAUNDERS, MD  Multiple Vitamin (MULTIVITAMIN WITH MINERALS) TABS tablet Take 1 tablet by mouth daily.   Yes [provider]  N-ACETYL CYSTEINE 600 MG TABS Take 1 tablet by mouth at bedtime. 11/02/21  Yes [provider]  OVER THE COUNTER MEDICATION Take 1 capsule by mouth at bedtime. PQQ: Pyrroloquinoline Quinone   Yes [provider]  pantoprazole  (PROTONIX ) 40 MG tablet Take 1 tablet (40 mg total) by mouth 2 (two) times daily. Patient taking differently: Take 40 mg by mouth daily. 05/18/24 06/17/24 Yes Waymond Cart, MD  RA CRANBERRY 500 MG CAPS Take 1 capsule by mouth in the morning and at bedtime. 11/02/21  Yes [provider]  rosuvastatin  (CRESTOR ) 10 MG tablet TAKE 1 TABLET BY MOUTH EVERY DAY Patient taking differently: Take 10 mg by mouth 4 (four) times a week. 12/20/23  Yes Arellano Zameza, Priscila, MD  sodium chloride  1 g tablet Take 2 tablets (2 g total) by mouth 2 (two) times daily with a meal. Patient taking  differently: Take 1 g by mouth 3 (three) times daily with meals. 05/30/24  Yes Azadegan, Maryam, MD  spironolactone  (ALDACTONE ) 25 MG tablet Take 0.5 tablets (12.5 mg total) by mouth daily. 05/31/24  Yes Azadegan, Maryam, MD  tamsulosin  (FLOMAX ) 0.4 MG CAPS capsule TAKE 1 CAPSULE BY MOUTH EVERY DAY Patient taking differently: Take 0.4 mg by mouth at bedtime. 01/24/24  Yes Arellano Zameza, Priscila, MD    Allergies: Patient has no known allergies.    Review of Systems  Respiratory:  Positive for shortness of breath.      Updated Vital Signs BP 120/80   Pulse 68   Temp 98.5 F (36.9 C)   Resp (!) 22   Ht 6' (1.829 m)   Wt 92.5 kg   SpO2 95%   BMI 27.67 kg/m   Physical Exam Vitals and nursing note reviewed.  HENT:     Head: Normocephalic and atraumatic.     Mouth/Throat:     Mouth: Mucous membranes are moist.  Eyes:     General:        Right eye: No discharge.        Left eye: No discharge.     Conjunctiva/sclera: Conjunctivae normal.  Cardiovascular:     Rate and Rhythm: Normal rate and regular rhythm.     Pulses: Normal pulses.     Heart sounds: Normal heart sounds.  Pulmonary:     Effort: Pulmonary effort is normal. Tachypnea present.     Breath sounds: Examination of the right-lower field reveals decreased breath sounds. Examination of the left-lower field reveals decreased breath sounds. Decreased breath sounds present.     Comments: Pausing every 2 to 3 words to catch his breath Abdominal:     General: Abdomen is flat.     Palpations: Abdomen is soft.  Skin:    General: Skin is warm and dry.  Neurological:     General: No focal deficit present.  Psychiatric:        Mood and Affect: Mood normal.     (all labs ordered are listed, but only abnormal results are displayed) Labs Reviewed  CBC WITH DIFFERENTIAL/PLATELET - Abnormal; Notable for the following components:      Result Value   RBC 3.71 (*)    Hemoglobin 11.2 (*)    HCT 34.6 (*)    Abs Immature Granulocytes 0.12 (*)    All other components within normal limits  COMPREHENSIVE METABOLIC PANEL WITH GFR - Abnormal; Notable for the following components:   Sodium 134 (*)    Calcium  8.5 (*)    Total Protein 6.0 (*)    Albumin 3.2 (*)    All other components within normal limits  RESP PANEL BY RT-PCR (RSV, FLU A&B, COVID)  RVPGX2  BRAIN NATRIURETIC PEPTIDE  I-STAT CG4 LACTIC ACID, ED  TROPONIN I (HIGH SENSITIVITY)  TROPONIN I (HIGH SENSITIVITY)    EKG: None  Radiology: DG Chest 2 View Result Date:  06/03/2024 CLINICAL DATA:  sob Reports worsening shortness of breath that started yesterday. Was just discharged on Wednesday. Reports can't lie flat and having difficulty sleeping due to trouble breathing. Patient has to stop speaking to take a breath. EXAM: CHEST - 2 VIEW COMPARISON:  CT angio chest 05/19/2024. FINDINGS: The heart and mediastinal contours are within normal limits. Bibasilar atelectasis. No pulmonary edema. At least bilateral trace volume pleural effusions. No pneumothorax. No acute osseous abnormality. Atherosclerotic plaque. IMPRESSION: 1.   At least bilateral trace volume pleural effusions. 2. Bibasilar  atelectasis with superimposed infection/inflammation not excluded. 3.  Aortic Atherosclerosis (ICD10-I70.0). Electronically Signed   By: Morgane  Naveau M.D.   On: 06/03/2024 13:27     Procedures   Medications Ordered in the ED  furosemide (LASIX) injection 40 mg (has no administration in time range)  ipratropium-albuterol  (DUONEB) 0.5-2.5 (3) MG/3ML nebulizer solution 3 mL (3 mLs Nebulization Given 06/03/24 1506)                                    Medical Decision Making Amount and/or Complexity of Data Reviewed Labs: ordered. Radiology: ordered.  Risk Prescription drug management.   Initial Impression and Ddx 73 year old well-appearing male with shortness of breath.  Exam notable for tachypnea.  DDx includes CHF exacerbation, COPD exacerbation, ACS, PE, pneumonia, other. Patient PMH that increases complexity of ED encounter: h/o stroke, CHF, cardiomyopathy, dilatation of the aorta, recent admission for CHF  Interpretation of Diagnostics - I independent reviewed and interpreted the labs as followed: no acute changes  - I independently visualized the following imaging with scope of interpretation limited to determining acute life threatening conditions related to emergency care: CXR, which revealed trace bilateral PE, bibasilar atelectasis questionable superimposed  infection  - I personally reviewed and interpreted which revealed sinus rhythm  Patient Reassessment and Ultimate Disposition/Management On reassessment still shob with tachypnea.  Workup does not suggest significant fluid overload.  Ordered CT chest to rule out PE.  Also gave him a dose of IV Lasix as he did have evidence of trace pleural effusions.  Signed out to PA [employer].  HD stable on room air.  Patient management required discussion with the following services or consulting groups:  None  Complexity of Problems Addressed Acute complicated illness or Injury  Additional Data Reviewed and Analyzed Further history obtained from: Past medical history and medications listed in the EMR, Prior ED visit notes, and Recent discharge summary  Patient Encounter Risk Assessment Consideration of hospitalization      Final diagnoses:  Shortness of breath    ED Discharge Orders     None          Lang Norleen POUR, PA-C 06/03/24 1600    Doretha Folks, MD 06/06/24 1636

## 2024-06-03 NOTE — ED Provider Notes (Signed)
 Patient taken at shift handoff from [facility]  Patient here with complaint of shortness of breath.  Recent hospitalization for CHF exacerbation.  Currently awaiting workup including CT angiogram of the chest.  Patient receiving a dose of Lasix and a DuoNeb. Physical Exam  BP 120/80   Pulse 68   Temp 98.5 F (36.9 C)   Resp (!) 22   Ht 6' (1.829 m)   Wt 92.5 kg   SpO2 95%   BMI 27.67 kg/m   Physical Exam  Procedures  Procedures  ED Course / MDM    Medical Decision Making Amount and/or Complexity of Data Reviewed Labs: ordered. Radiology: ordered.  Risk Prescription drug management.   CT angiogram negative for PE or pulmonary edema or any other abnormal findings.  He has persistent cardiomegaly.  He was able to ambulate in the emergency department with oxygen saturations 95% on room air.  Patient does not appear to have any emergency cause of his perception of dyspnea.  Feel he is safe for discharge at this time.       Arloa Chroman, PA-C 06/03/24 7698    Dean Clarity, MD 06/03/24 [phone]

## 2024-06-10 ENCOUNTER — Other Ambulatory Visit: Payer: Self-pay

## 2024-06-10 DIAGNOSIS — K2970 Gastritis, unspecified, without bleeding: Secondary | ICD-10-CM

## 2024-06-11 ENCOUNTER — Ambulatory Visit (INDEPENDENT_AMBULATORY_CARE_PROVIDER_SITE_OTHER): Payer: Self-pay

## 2024-06-11 VITALS — BP 119/90 | HR 85 | Temp 98.2°F | Ht 73.0 in | Wt 208.0 lb

## 2024-06-11 DIAGNOSIS — J9601 Acute respiratory failure with hypoxia: Secondary | ICD-10-CM

## 2024-06-11 DIAGNOSIS — E871 Hypo-osmolality and hyponatremia: Secondary | ICD-10-CM | POA: Diagnosis not present

## 2024-06-11 DIAGNOSIS — R0602 Shortness of breath: Secondary | ICD-10-CM

## 2024-06-11 NOTE — Progress Notes (Unsigned)
   Established Patient Office Visit  Subjective   Patient ID: Edward [name], male    DOB: Jan 11, 1951  Age: 73 y.o. MRN: [id number]  Chief Complaint  Patient presents with  . Hospitalization Follow-up    HPI Follow up Hospitalization  Patient was admitted to [facility] on 9/28 and discharged on 10/1. He was treated for Acute respiratory failure with hypoxia. Treatment for this included bronchodilator treatments and incentive spirometry, started on symbicort. Sent home with symbicort, cetirizine  and flonase   Telephone follow up was done on *** He reports {excellent/good/fair:19992} compliance with treatment. He reports this condition is {resolved/improved/worsened:23923}.  ----------------------------------------------------------------------------------------- -  {History (Optional):23778}  ROS    Objective:     BP (!) 119/90 (BP Location: Left Arm, Patient Position: Sitting, Cuff Size: Small)   Pulse 85   Temp 98.2 F (36.8 C) (Oral)   Ht 6' 1 (1.854 m)   Wt 208 lb (94.3 kg)   SpO2 97%   BMI 27.44 kg/m  {Vitals History (Optional):23777}  Physical Exam   No results found for any visits on 06/11/24.  {Labs (Optional):23779}  The ASCVD Risk score (Arnett DK, et al., 2019) failed to calculate for the following reasons:   Risk score cannot be calculated because patient has a medical history suggesting prior/existing ASCVD    Assessment & Plan:   Problem List Items Addressed This Visit   None  PMH of ascending aortic aneurysm, non ischemic cardiomyopathy, leukopenia, GI hemorrhage, BPH,   Hyponatremia  BMP  Not taking    Leukopenia/anemia CBC?   Shortness of breath Hx of allergy related asthma  Got bronchodilator therapy and IS.   PFTs History of atelectasis Patient is on Symbicort, continue incentive spirometry, pulmonary rehab ?  Has been using flonase  and cetirizine   No follow-ups on file.    [name], DO

## 2024-06-11 NOTE — Patient Instructions (Addendum)
   Today we discussed the following medical conditions and plan:   For your lungs, we will send you to the lung doctors. Someone should call and set you up with an appointment. We will also get some tests done to see how your lungs function.Continue using your symbicort, which you can use up to twice a day   Please continue to follow up with the Dr.Babcock and the stomach doctors  We look forward to seeing you next time. Please call our clinic at [phone] if you have any questions or concerns. The best time to call is Monday-Friday from 9am-4pm, but there is someone available 24/7. If you need medication refills, please notify your pharmacy one week in advance and they will send us  a request.   Thank you for trusting me with your care. Wishing you the best!   Edward Diesing D'Mello, DO  [facility]

## 2024-06-12 DIAGNOSIS — R0602 Shortness of breath: Secondary | ICD-10-CM | POA: Insufficient documentation

## 2024-06-12 NOTE — Assessment & Plan Note (Signed)
 Patient presented to the ED on 10/5 for worsening shortness of breath.  At that time labs were checked and showed sodium of 134.  Patient has not been taking any of his salt tabs.  At this time will not check this lab again and patient does not need to take any salt tablets

## 2024-06-12 NOTE — Assessment & Plan Note (Addendum)
 Patient reports that he has a history of allergy related asthma that he first started having last year.  He reports that he has been using his cetirizine  and Flonase  faithfully.  Has reported that he is continuing to use incentive spirometry although he states that he could be using this more often.  He states that he has been having some shortness of breath but he is only using Symbicort once a day.  With normal BNP and volume exam today with no noted JVD or lower extremity swelling or Rales heard, do not believe that the cause of this shortness of breath is cardiac related.  Plan: Will order PFTs Refer to pulmonology Continue albuterol  inhaler 2 puffs every 4 hours as needed Continue Symbicort 2 puffs 2 times daily as needed Can assess need for any changes in inhaler regimen based off of PFTs

## 2024-06-12 NOTE — Progress Notes (Incomplete)
 HEART & VASCULAR TRANSITION OF CARE CONSULT NOTE     Referring Physician: Norrine Sharper, MD   Chief Complaint: HFmrEF  HPI: Referred to clinic by Dr. Francesco with internal medicine teaching service for heart failure consultation.   Edward [name] is a 73 y.o. male with history of HFmrEF, bradycardia, varicose veins, ascending aortic aneurysm, HTN, HLD, CVA s/p TPA in 2016, BPH, coronary CTA 2022 with calcium  score of 0 and no CAD.   Echo 2019: EF 50-55%  Echo 12/21: EF 35%  Echo 04/23: EF 35-40%, RV okay  Echo 01/25: EF 45-50%, RV okay  He's had multiple recent admissions. Admitted 9/8 w/ SIADH w/ severe hyponatremia, possible acute pancreatitis (normal lipase but CT imaging suggested) and E.Coli UTI. For management of SIADH, meloxicam  and spironolactone  were stopped. He was started on salt tablets. He was discharged 9/13 then readmitted 9/17 w/ GI bleeding and acute anemia. EGD with evidence of gastritis and gastric polyps which were biopsied. GI f/u arranged and started on protonix . He was then readmitted 9/28 with worsening shortness of breath and respiratory failure felt to be 2/2 combination of CHF but suspected primary pulmonary etiology.  Improved with IV diuresis and bronchodilators. Referred to [facility] and pulmonology.   Here today for post hospital CHF follow-up. Dyspnea has improved slightly since discharge. Still gets winded walking far distance or when attempting to lie down in bed. Functional capacity noticeably declined over the last few months. He's had most noticeable relief with use of symbicort and incentive spirometer. No abdominal bloating or leg edema. His home weight has been between 199-202 lb. No dizziness, presyncope or syncope.  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   No tobacco use, denies heavy  ETOH.  Father may have had CHF (hx unclear), mother had TIAs/strokes. No siblings with CHF.   Past Medical History:  Diagnosis Date   Abnormal EKG 05/29/2018   Sinus bradycardia and LAFB with TWI V4-V6, III and aVF   Benign essential HTN 05/29/2018   Cardiomyopathy (HCC)    a. dx by echo 07/2020.   Dilation of aorta    Environmental allergies    Fracture, ribs    H/O: varicose veins    Hearing loss    Hyperlipidemia LDL goal <70    Multiple food allergies    Rash    Seasonal allergies    Sinusitis    Stroke [facility])    s/p tpa    Current Outpatient Medications  Medication Sig Dispense Refill   acetaminophen  (TYLENOL ) 650 MG CR tablet Take 1,300 mg by mouth every 8 (eight) hours as needed for pain.     albuterol  (VENTOLIN  HFA) 108 (90 Base) MCG/ACT inhaler Inhale 2 puffs into the lungs every 4 (four) hours as needed for wheezing or shortness of breath.     aspirin  EC 81 MG tablet Take 81 mg by mouth at bedtime. Swallow whole.     budesonide-formoterol (SYMBICORT) 80-4.5 MCG/ACT inhaler Inhale 2 puffs into the lungs 2 (two) times daily as needed for up to 1 dose. 10.2 g 0   cetirizine  (ZYRTEC  ALLERGY) 10 MG tablet Take 1 tablet (10 mg total) by mouth at bedtime. 15 tablet 0   Coenzyme Q10 (COQ-10) 100 MG CAPS Take 1 capsule by mouth in the morning and at bedtime.     finasteride  (PROSCAR )  5 MG tablet TAKE 1 TABLET (5 MG TOTAL) BY MOUTH DAILY. 90 tablet 1   fluticasone  (FLONASE ) 50 MCG/ACT nasal spray Place 1 spray into both nostrils daily. 16 g 0   metoprolol  succinate (TOPROL -XL) 25 MG 24 hr tablet TAKE 1 TABLET (25 MG) BY MOUTH DAILY. APPOINTMENT NEEDED FOR CONTINUATION OF REFILLS-1ST ATTEMPT (Patient taking differently: Take 25 mg by mouth at bedtime.) 90 tablet 3   Multiple Vitamin (MULTIVITAMIN WITH MINERALS) TABS tablet Take 1 tablet by mouth daily.     N-ACETYL CYSTEINE 600 MG TABS Take 1 tablet by mouth at bedtime.     omeprazole  (PRILOSEC) 10 MG capsule Take 10 mg by mouth  daily as needed.     OVER THE COUNTER MEDICATION Take 1 capsule by mouth at bedtime. PQQ: Pyrroloquinoline Quinone     RA CRANBERRY 500 MG CAPS Take 1 capsule by mouth in the morning and at bedtime.     rosuvastatin  (CRESTOR ) 10 MG tablet TAKE 1 TABLET BY MOUTH EVERY DAY (Patient taking differently: Take 10 mg by mouth 4 (four) times a week.) 90 tablet 1   spironolactone  (ALDACTONE ) 25 MG tablet Take 0.5 tablets (12.5 mg total) by mouth daily. 30 tablet 0   tamsulosin  (FLOMAX ) 0.4 MG CAPS capsule TAKE 1 CAPSULE BY MOUTH EVERY DAY (Patient taking differently: Take 0.4 mg by mouth at bedtime.) 90 capsule 1   pantoprazole  (PROTONIX ) 40 MG tablet Take 1 tablet (40 mg total) by mouth daily. (Patient not taking: Reported on 06/14/2024) 90 tablet 0   sodium chloride  1 g tablet Take 2 tablets (2 g total) by mouth 2 (two) times daily with a meal. (Patient not taking: Reported on 06/14/2024) 180 tablet 0   No current facility-administered medications for this encounter.    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  Tobacco Use   Smoking status: Former   Smokeless tobacco: Never   Tobacco comments:    quit 40+ years ago  Vaping Use   Vaping status: Never Used  Substance and Sexual Activity   Alcohol use: Not Currently   Drug use: Never   Sexual activity: Yes    Partners: Female  Other Topics Concern   Not on file  Social History Narrative   Not on file   Social Drivers of Health   Financial Resource Strain: Low Risk  (05/30/2024)   Overall Financial Resource Strain (CARDIA)    Difficulty of Paying Living Expenses: Not very hard  Food Insecurity: No Food Insecurity (05/31/2024)   Hunger Vital Sign    Worried About Running  Out of Food in the Last Year: Never true    Ran Out of Food in the Last Year: Never true  Transportation Needs: No Transportation Needs (05/31/2024)   PRAPARE - Administrator, Civil Service (Medical): No    Lack of Transportation (Non-Medical): No  Physical Activity: Sufficiently Active (01/19/2023)   Exercise Vital Sign    Days of Exercise per Week: 7 days    Minutes of Exercise per Session: 30 min  Stress: No Stress Concern Present (01/19/2023)   [employer] of Occupational Health - Occupational Stress Questionnaire    Feeling of Stress :  Not at all  Social Connections: Socially Integrated (05/28/2024)   Social Connection and Isolation Panel    Frequency of Communication with Friends and Family: More than three times a week    Frequency of Social Gatherings with Friends and Family: More than three times a week    Attends Religious Services: More than 4 times per year    Active Member of [employer] or Organizations: Yes    Attends Engineer, structural: More than 4 times per year    Marital Status: Married  Catering manager Violence: Not At Risk (05/31/2024)   Humiliation, Afraid, Rape, and Kick questionnaire    Fear of Current or Ex-Partner: No    Emotionally Abused: No    Physically Abused: No    Sexually Abused: No      Family History  Problem Relation Age of Onset   CVA Mother    Heart failure Father        CHF   Brain cancer Brother        GBM   Colon polyps Neg Hx    Colon cancer Neg Hx    Esophageal cancer Neg Hx    Rectal cancer Neg Hx    Stomach cancer Neg Hx     Vitals:   06/14/24 1420  BP: (!) 130/90  Pulse: 77  SpO2: 94%  Weight: 94 kg (207 lb 3.2 oz)  Height: 6' 1 (1.854 m)    PHYSICAL EXAM: General:  Elderly male. Ambulated into clinic. Neck: no JVD Cor: Regular rate & rhythm. No murmurs. Lungs: diminished Abdomen: soft, nontender, nondistended. Extremities: trace edema Neuro: alert & oriented x 3. Affect  pleasant.    ASSESSMENT & PLAN: HFmrEF -EF as low as 35% in 2021 -Coronary CTA in 2/22: calcium  score 0, no significant CAD -Echo 04/23: EF 35-40%,  -Echo 01/25: EF 45-50%, RV mildly enlarged with okay function -Etiology not certain -NYHA III, not entirely clear at this time if limitation is due to HF or pulmonary source. Volume looks good on exam and by ReDS which is 28%. Not currently on loop diuretic.  -Continue spiro 12.5 mg daily -Continue toprol  XL 25 mg nightly, denies wheezing -Hold off on SGLT2i for now, recent UTI (hx ESBL E.Coli) -Add entresto  24/26 mg BID, will start losartan instead if too expensive. Scr 1.04, K 4.0, BNP 75 on 10/05 -BMET 1 week -Repeat echo  2. Shortness of breath -Etiology not clear -Not volume overloaded. Repeating echo to reassess LV, RV and assess RVSP.  -Discussed with Dr. Rolan. ? Pulmonary source. Desaturation noted when ambulating in clinic today, 94%>>89% on RA. May end up requiring home O2. Seems to have some symptom improvement with addition of Symbicort. Has appt with Pulmonary in 2 weeks. Will go ahead and arrange HiRes CT to start workup. Will likely also need PFTs.  3. HTN -BP above goal -Starting Entresto  as above  4. Ascending aortic aneurysm -Aortic root measured 4.5 cm and ascending aorta 4 cm on echo 1/25 -Ascending aorta 4.1 cm on CT PE study 10/25 -Follow by primary Cardiology team  Referred to HFSW (PCP, Medications, Transportation, ETOH Abuse, Drug Abuse, Insurance, Financial ): No Refer to Pharmacy: No Refer to Home Health: No Refer to Advanced Heart Failure Clinic: pending results Refer to General Cardiology: No, already established  Follow up  6 weeks to review echo/workup (r/o ILD/pulmonary hypertension). If no concern for pulmonary hypertension can then follow with primary cardiology team going forward   Manuelita Dutch, PA-C

## 2024-06-13 NOTE — Progress Notes (Signed)
 Internal Medicine Clinic Attending  I was physically present during the key portions of the resident provided service and participated in the medical decision making of patient's management care. I reviewed pertinent patient test results.  The assessment, diagnosis, and plan were formulated together and I agree with the documentation in the resident's note.  Recent CT with some improvement in atelectasis and no evidence of pulmonary edema, normal bnp.  I do think he is improving,  still dyspneic with ambulation but did pretty well in office today and maintained O2 saturation.  He will continue symbicort and obtain outpatient pulmonology eval to include PFT's.    Francesco Elsie NOVAK, MD

## 2024-06-14 ENCOUNTER — Other Ambulatory Visit (HOSPITAL_COMMUNITY): Payer: Self-pay

## 2024-06-14 ENCOUNTER — Encounter (HOSPITAL_COMMUNITY): Payer: Self-pay

## 2024-06-14 ENCOUNTER — Ambulatory Visit (HOSPITAL_COMMUNITY)
Admit: 2024-06-14 | Discharge: 2024-06-14 | Disposition: A | Discharge status: Home / Self Care | Source: Ambulatory Visit | Attending: Physician Assistant | Admitting: Physician Assistant

## 2024-06-14 VITALS — BP 130/90 | HR 77 | Ht 73.0 in | Wt 207.2 lb

## 2024-06-14 DIAGNOSIS — R0602 Shortness of breath: Secondary | ICD-10-CM | POA: Diagnosis not present

## 2024-06-14 DIAGNOSIS — E785 Hyperlipidemia, unspecified: Secondary | ICD-10-CM | POA: Diagnosis not present

## 2024-06-14 DIAGNOSIS — Z79899 Other long term (current) drug therapy: Secondary | ICD-10-CM | POA: Insufficient documentation

## 2024-06-14 DIAGNOSIS — N400 Enlarged prostate without lower urinary tract symptoms: Secondary | ICD-10-CM | POA: Diagnosis not present

## 2024-06-14 DIAGNOSIS — Z8673 Personal history of transient ischemic attack (TIA), and cerebral infarction without residual deficits: Secondary | ICD-10-CM | POA: Diagnosis not present

## 2024-06-14 DIAGNOSIS — I7121 Aneurysm of the ascending aorta, without rupture: Secondary | ICD-10-CM | POA: Insufficient documentation

## 2024-06-14 DIAGNOSIS — I110 Hypertensive heart disease with heart failure: Secondary | ICD-10-CM | POA: Insufficient documentation

## 2024-06-14 DIAGNOSIS — I5032 Chronic diastolic (congestive) heart failure: Secondary | ICD-10-CM | POA: Insufficient documentation

## 2024-06-14 DIAGNOSIS — I5022 Chronic systolic (congestive) heart failure: Secondary | ICD-10-CM | POA: Diagnosis not present

## 2024-06-14 DIAGNOSIS — I10 Essential (primary) hypertension: Secondary | ICD-10-CM | POA: Diagnosis not present

## 2024-06-14 MED ORDER — LOSARTAN POTASSIUM 25 MG PO TABS: 25.0000 mg | ORAL_TABLET | Freq: Every day | ORAL | 0 refills | Status: DC

## 2024-06-14 NOTE — Progress Notes (Signed)
 ReDS Vest / Clip - 06/14/24 1420       ReDS Vest / Clip   Station Marker D    Ruler Value 31.5    ReDS Value Range Low volume    ReDS Actual Value 28

## 2024-06-14 NOTE — Patient Instructions (Addendum)
 No Labs done today.   START Losartan 25mg  (1 tablet) by mouth daily.   No other medication changes were made. Please continue all current medications as prescribed.  Non-Cardiac CT scanning, (CAT scanning), is a noninvasive, special x-ray that produces cross-sectional images of the body using x-rays and a computer. CT scans help physicians diagnose and treat medical conditions. For some CT exams, a contrast material is used to enhance visibility in the area of the body being studied. CT scans provide greater clarity and reveal more details than regular x-ray exams. We will contact you to schedule an appointment at a later date.   Your physician recommends that you schedule a follow-up appointment soon for an echo, 2 weeks for a lab only appointment and in 6 weeks with our [facility] clinic here in the office  Your physician has requested that you have an echocardiogram. Echocardiography is a painless test that uses sound waves to create images of your heart. It provides your doctor with information about the size and shape of your heart and how well your heart's chambers and valves are working. This procedure takes approximately one hour. There are no restrictions for this procedure. Please do NOT wear cologne, perfume, aftershave, or lotions (deodorant is allowed). Please arrive 15 minutes prior to your appointment time.  Please note: We ask at that you not bring children with you during ultrasound (echo/ vascular) testing. Due to room size and safety concerns, children are not allowed in the ultrasound rooms during exams. Our front office staff cannot provide observation of children in our lobby area while testing is being conducted. An adult accompanying a patient to their appointment will only be allowed in the ultrasound room at the discretion of the ultrasound technician under special circumstances. We apologize for any inconvenience.  If you have any questions or concerns before your next appointment  please send us  a message through [place] or call our office at [phone].    TO LEAVE A MESSAGE FOR THE NURSE SELECT OPTION 2, PLEASE LEAVE A MESSAGE INCLUDING: YOUR NAME DATE OF BIRTH CALL BACK NUMBER REASON FOR CALL**this is important as we prioritize the call backs  YOU WILL RECEIVE A CALL BACK THE SAME DAY AS LONG AS YOU CALL BEFORE 4:00 PM   Do the following things EVERYDAY: Weigh yourself in the morning before breakfast. Write it down and keep it in a log. Take your medicines as prescribed Eat low salt foods--Limit salt (sodium) to 2000 mg per day.  Stay as active as you can everyday Limit all fluids for the day to less than 2 liters   At the [facility], you and your health needs are our priority. As part of our continuing mission to provide you with exceptional heart care, we have created designated Provider Care Teams. These Care Teams include your primary Cardiologist (physician) and Advanced Practice Providers (APPs- Physician Assistants and Nurse Practitioners) who all work together to provide you with the care you need, when you need it.   You may see any of the following providers on your designated Care Team at your next follow up: Dr Toribio Fuel Dr Ezra Shuck Dr. Ria Gardenia Greig Lenetta, NP Caffie Shed, [place] [facility] [place], [place] Beckey Coe, NP Tinnie Redman, PharmD   Please be sure to bring in all your medications bottles to every appointment.    Thank you for choosing [facility]

## 2024-06-15 ENCOUNTER — Encounter: Payer: Self-pay | Admitting: Gastroenterology

## 2024-06-15 ENCOUNTER — Ambulatory Visit: Admitting: Gastroenterology

## 2024-06-15 VITALS — BP 116/70 | HR 61 | Ht 72.0 in | Wt 207.0 lb

## 2024-06-15 DIAGNOSIS — K922 Gastrointestinal hemorrhage, unspecified: Secondary | ICD-10-CM

## 2024-06-15 DIAGNOSIS — K219 Gastro-esophageal reflux disease without esophagitis: Secondary | ICD-10-CM

## 2024-06-15 DIAGNOSIS — K859 Acute pancreatitis without necrosis or infection, unspecified: Secondary | ICD-10-CM

## 2024-06-15 NOTE — Progress Notes (Unsigned)
 06/15/2024 Edward [name] Brought [id number] 1951/02/15   HISTORY OF PRESENT ILLNESS:  Discussed the use of AI scribe software for clinical note transcription with the patient, who gave verbal consent to proceed.  History of Present Illness Edward [name] is a 73 year old male with a history of pancreatitis and gastrointestinal bleeding who presents for a follow-up after recent hospitalization.  He was hospitalized about a month ago due to an episode of pancreatitis and maroon-colored stools. During the hospitalization, an endoscopy revealed mild gastritis and multiple benign fundic gland polyps. The bleeding was suspected to be due to a diverticular bleed. Since discharge, he has not experienced any further bleeding.  He was initially switched to pantoprazole  during his hospital stay but has since returned to omeprazole  due to personal preference and concerns about burping. Despite the change, he continues to experience frequent burping and mild nausea, described as a 'queasy stomach' not related to food intake. The nausea is mild and does not lead to vomiting.  He has a history of an aneurysm on a blood vessel going to his stomach, for which an angiogram is planned. He reports difficulty breathing when lying flat and experiences tenderness when lying on his back.  His past medical history includes heart failure, and he has been advised to avoid alcohol. He consumes minimal alcohol, limited to small servings of wine with dinner. His sodium levels were previously low, leading to a brief period of salt tablet use, which has since been discontinued. His hemoglobin levels have been stable, with no recent episodes of black or bloody stools.    - The examined portions of the nasopharynx, oropharynx and larynx were normal. - Normal esophagus. - Gastritis. Biopsied. - Multiple gastric polyps. Biopsied. Suspect mixture of fundic gland polyps and hyperplastic polyps. - Normal examined duodenum. - No  endoscopic findings to explain GI bleeding. - Suspect diverticular bleed  A. STOMACH, BIOPSY:       Gastric antral / oxyntic mucosa with focal mild chronic inactive  gastritis.      No H. pylori identified on HE stain.       Negative for intestinal metaplasia or dysplasia.   B. STOMACH, POLYP, BIOPSY:       Fragments of fundic gland polyp.   No H. pylori identified on HE stain.       Negative for intestinal metaplasia or dysplasia.    Past Medical History:  Diagnosis Date   Abnormal EKG 05/29/2018   Sinus bradycardia and LAFB with TWI V4-V6, III and aVF   Benign essential HTN 05/29/2018   Cardiomyopathy (HCC)    a. dx by echo 07/2020.   Dilation of aorta    Environmental allergies    Fracture, ribs    H/O: varicose veins    Hearing loss    Hyperlipidemia LDL goal <70    Multiple food allergies    Rash    Seasonal allergies    Sinusitis    Stroke [facility])    s/p tpa   Past Surgical History:  Procedure Laterality Date   BIOPSY OF SKIN SUBCUTANEOUS TISSUE AND/OR MUCOUS MEMBRANE  05/18/2024   Procedure: BIOPSY, SKIN, SUBCUTANEOUS TISSUE, OR MUCOUS MEMBRANE;  Surgeon: Stacia Glendia BRAVO, MD;  Location: [facility] ENDOSCOPY;  Service: Gastroenterology;;   COLONOSCOPY     ESOPHAGOGASTRODUODENOSCOPY N/A 05/18/2024   Procedure: EGD (ESOPHAGOGASTRODUODENOSCOPY);  Surgeon: Stacia Glendia BRAVO, MD;  Location: [facility] ENDOSCOPY;  Service: Gastroenterology;  Laterality: N/A;   INGUINAL HERNIA REPAIR Left  IR RADIOLOGIST EVAL & MGMT  05/23/2024   right hand surgery      reports that he has quit smoking. He has never used smokeless tobacco. He reports that he does not currently use alcohol. He reports that he does not use drugs. family history includes Brain cancer in his brother; CVA in his mother; Heart failure in his father. No Known Allergies    Outpatient Encounter Medications as of 06/15/2024  Medication Sig   acetaminophen  (TYLENOL ) 650 MG CR tablet Take 1,300 mg by mouth every 8 (eight)  hours as needed for pain.   albuterol  (VENTOLIN  HFA) 108 (90 Base) MCG/ACT inhaler Inhale 2 puffs into the lungs every 4 (four) hours as needed for wheezing or shortness of breath.   aspirin  EC 81 MG tablet Take 81 mg by mouth at bedtime. Swallow whole.   budesonide-formoterol (SYMBICORT) 80-4.5 MCG/ACT inhaler Inhale 2 puffs into the lungs 2 (two) times daily as needed for up to 1 dose.   cetirizine  (ZYRTEC  ALLERGY) 10 MG tablet Take 1 tablet (10 mg total) by mouth at bedtime.   Coenzyme Q10 (COQ-10) 100 MG CAPS Take 1 capsule by mouth in the morning and at bedtime.   finasteride  (PROSCAR ) 5 MG tablet TAKE 1 TABLET (5 MG TOTAL) BY MOUTH DAILY.   fluticasone  (FLONASE ) 50 MCG/ACT nasal spray Place 1 spray into both nostrils daily.   losartan (COZAAR) 25 MG tablet Take 1 tablet (25 mg total) by mouth daily.   metoprolol  succinate (TOPROL -XL) 25 MG 24 hr tablet TAKE 1 TABLET (25 MG) BY MOUTH DAILY. APPOINTMENT NEEDED FOR CONTINUATION OF REFILLS-1ST ATTEMPT (Patient taking differently: Take 25 mg by mouth at bedtime.)   Multiple Vitamin (MULTIVITAMIN WITH MINERALS) TABS tablet Take 1 tablet by mouth daily.   N-ACETYL CYSTEINE 600 MG TABS Take 1 tablet by mouth at bedtime.   omeprazole  (PRILOSEC) 10 MG capsule Take 10 mg by mouth daily as needed.   OVER THE COUNTER MEDICATION Take 1 capsule by mouth at bedtime. PQQ: Pyrroloquinoline Quinone   pantoprazole  (PROTONIX ) 40 MG tablet Take 1 tablet (40 mg total) by mouth daily. (Patient not taking: Reported on 06/14/2024)   RA CRANBERRY 500 MG CAPS Take 1 capsule by mouth in the morning and at bedtime.   rosuvastatin  (CRESTOR ) 10 MG tablet TAKE 1 TABLET BY MOUTH EVERY DAY (Patient taking differently: Take 10 mg by mouth 4 (four) times a week.)   sodium chloride  1 g tablet Take 2 tablets (2 g total) by mouth 2 (two) times daily with a meal. (Patient not taking: Reported on 06/14/2024)   spironolactone  (ALDACTONE ) 25 MG tablet Take 0.5 tablets (12.5 mg total)  by mouth daily.   tamsulosin  (FLOMAX ) 0.4 MG CAPS capsule TAKE 1 CAPSULE BY MOUTH EVERY DAY (Patient taking differently: Take 0.4 mg by mouth at bedtime.)   No facility-administered encounter medications on file as of 06/15/2024.     REVIEW OF SYSTEMS  : All other systems reviewed and negative except where noted in the History of Present Illness.   PHYSICAL EXAM: BP 116/70   Pulse 61   Ht 6' (1.829 m)   Wt 207 lb (93.9 kg)   BMI 28.07 kg/m  General: Well developed white male in no acute distress Head: Normocephalic and atraumatic Eyes:  sclerae anicteric,conjunctive pink. Ears: Normal auditory acuity Neck: Supple, no masses.  Lungs: Clear throughout to auscultation Heart: Regular rate and rhythm Abdomen: Soft, nontender, non distended. No masses or hepatomegaly noted. Normal bowel sounds Rectal: ***  Musculoskeletal: Symmetrical with no gross deformities  Skin: No lesions on visible extremities Extremities: No edema  Neurological: Alert oriented x 4, grossly nonfocal Cervical Nodes:  No significant cervical adenopathy Psychological:  Alert and cooperative. Normal mood and affect  ASSESSMENT AND PLAN: 73 year old male admitted with upper abdominal pain / gastrointestinal bleeding (maroon stools).  Upper abdominal pain may be related to recent pancreatitis but with the gastrointestinal bleeding need to consider that he has developed PUD in the setting of meloxicam  and ibuprofen .  CT angio negative for extravasation of blood. He is hemodynamically stable and interestingly BUN is normal .  Small bowel bleed  / lower GI bleed on the list of possibilities .  No active bleeding /or any BMs since 6 AM yesterday.    Gastric artery aneurysm Unlikely source of bleed with negative CT angiogram   Acute normocytic anemia Baseline hemoglobin around 15 but more recently 12.5 around at time of hospital discharge  9/12 (admission for pancreatitis).  Presenting hemoglobin this admission with  GI bleed was 11.5, currently down to 9.7   Recent episode of pancreatitis Radiologic findings of mild, acute uncomplicated pancreatitis on CTAP with contrast 05/07/2024 with normal lipase.  Etiology?  EtOH unlikely factor .  Did not seem biliary related .  Improving on CT this admission .    History of colon polyps Small tubular adenoma March 2022.  7-year follow-up colonoscopy was recommended   Chronic bladder outlet obstruction /cystitis   Chronic GERD  On daily PPI at home   Nonischemic cardiomyopathy EF 45 to 50% per echo in January 2025.    CVA   Hypertension   CC:  Francesco Elsie NOVAK, MD

## 2024-06-15 NOTE — Patient Instructions (Signed)
 Follow up as needed.   _______________________________________________________  If your blood pressure at your visit was 140/90 or greater, please contact your primary care physician to follow up on this.  _______________________________________________________  If you are age 71 or older, your body mass index should be between 23-30. Your Body mass index is 28.07 kg/m. If this is out of the aforementioned range listed, please consider follow up with your Primary Care Provider.  If you are age 35 or younger, your body mass index should be between 19-25. Your Body mass index is 28.07 kg/m. If this is out of the aformentioned range listed, please consider follow up with your Primary Care Provider.   ________________________________________________________  The [facility] providers would like to encourage you to use MYCHART to communicate with providers for non-urgent requests or questions.  Due to long hold times on the telephone, sending your provider a message by [facility] may be a faster and more efficient way to get a response.  Please allow 48 business hours for a response.  Please remember that this is for non-urgent requests.  _______________________________________________________  [facility] is using a team-based approach to care.  Your team is made up of your doctor and two to three APPS. Our APPS (Nurse Practitioners and Physician Assistants) work with your physician to ensure care continuity for you. They are fully qualified to address your health concerns and develop a treatment plan. They communicate directly with your gastroenterologist to care for you. Seeing the Advanced Practice Practitioners on your physician's team can help you by facilitating care more promptly, often allowing for earlier appointments, access to diagnostic testing, procedures, and other specialty referrals.

## 2024-06-18 DIAGNOSIS — K859 Acute pancreatitis without necrosis or infection, unspecified: Secondary | ICD-10-CM | POA: Insufficient documentation

## 2024-06-18 NOTE — Addendum Note (Signed)
 Encounter addended by: Dante Jeannine HERO, CMA on: 06/18/2024 3:04 PM  Actions taken: Charge Capture section accepted

## 2024-06-19 ENCOUNTER — Telehealth: Payer: Self-pay

## 2024-06-19 DIAGNOSIS — K859 Acute pancreatitis without necrosis or infection, unspecified: Secondary | ICD-10-CM

## 2024-06-19 NOTE — Telephone Encounter (Signed)
-----   Message from Haynes D. Zehr sent at 06/19/2024  2:03 PM EDT ----- Please let the patient know that Dr. Wilhelmenia would like him to have a right upper quadrant abdominal ultrasound to evaluate for gallstones as possible cause of his pancreatitis episode.  Ultrasound is more sensitive than CT scan for this.  He would also like him to come for another lab test, please order IgG4 level.  He can have that done at his convenience in the next week or 2.  Please schedule ultrasound if he is agreeable.  Thank you,  Jess

## 2024-06-19 NOTE — Progress Notes (Signed)
 Attending Physician's Attestation   I have reviewed the chart.   I agree with the Advanced Practitioner's note, impression, and recommendations with any updates as below. Recommend obtaining a right upper quadrant ultrasound to better define whether any gallstone disease may be noted since ultrasound is more sensitive than CT scan for gallstones.  Also recommend IgG4 level be sent in workup of etiology of pancreatitis.  These can be done as able by patient.   Aloha Finner, MD [facility] Office # [phone]

## 2024-06-20 NOTE — Telephone Encounter (Signed)
 The pt has been advised and agrees to the plan. He will come in for labs and await a call from the schedulers for US .

## 2024-06-21 ENCOUNTER — Other Ambulatory Visit: Payer: Self-pay | Admitting: Radiology

## 2024-06-21 ENCOUNTER — Other Ambulatory Visit: Payer: Self-pay

## 2024-06-21 DIAGNOSIS — Z01812 Encounter for preprocedural laboratory examination: Secondary | ICD-10-CM

## 2024-06-22 ENCOUNTER — Ambulatory Visit (HOSPITAL_COMMUNITY)
Admission: RE | Admit: 2024-06-22 | Discharge: 2024-06-22 | Disposition: A | Discharge status: Home / Self Care | Source: Ambulatory Visit

## 2024-06-22 ENCOUNTER — Other Ambulatory Visit (HOSPITAL_COMMUNITY): Payer: Self-pay

## 2024-06-22 ENCOUNTER — Other Ambulatory Visit: Payer: Self-pay

## 2024-06-22 DIAGNOSIS — Z8673 Personal history of transient ischemic attack (TIA), and cerebral infarction without residual deficits: Secondary | ICD-10-CM | POA: Diagnosis not present

## 2024-06-22 DIAGNOSIS — I5032 Chronic diastolic (congestive) heart failure: Secondary | ICD-10-CM | POA: Insufficient documentation

## 2024-06-22 DIAGNOSIS — R918 Other nonspecific abnormal finding of lung field: Secondary | ICD-10-CM | POA: Diagnosis not present

## 2024-06-22 DIAGNOSIS — I728 Aneurysm of other specified arteries: Secondary | ICD-10-CM | POA: Diagnosis not present

## 2024-06-22 DIAGNOSIS — N400 Enlarged prostate without lower urinary tract symptoms: Secondary | ICD-10-CM | POA: Insufficient documentation

## 2024-06-22 DIAGNOSIS — E785 Hyperlipidemia, unspecified: Secondary | ICD-10-CM | POA: Insufficient documentation

## 2024-06-22 DIAGNOSIS — I110 Hypertensive heart disease with heart failure: Secondary | ICD-10-CM | POA: Insufficient documentation

## 2024-06-22 DIAGNOSIS — Z01812 Encounter for preprocedural laboratory examination: Secondary | ICD-10-CM

## 2024-06-22 DIAGNOSIS — Z539 Procedure and treatment not carried out, unspecified reason: Secondary | ICD-10-CM | POA: Diagnosis not present

## 2024-06-22 DIAGNOSIS — R001 Bradycardia, unspecified: Secondary | ICD-10-CM | POA: Diagnosis not present

## 2024-06-22 HISTORY — PX: IR FLUORO RM 30-60 MIN: IMG2384

## 2024-06-22 LAB — PROTIME-INR
INR: 1 (ref 0.8–1.2)
Prothrombin Time: 13.4 s (ref 11.4–15.2)

## 2024-06-22 LAB — CBC
HCT: 38 % — ABNORMAL LOW (ref 39.0–52.0)
Hemoglobin: 12.6 g/dL — ABNORMAL LOW (ref 13.0–17.0)
MCH: 30.4 pg (ref 26.0–34.0)
MCHC: 33.2 g/dL (ref 30.0–36.0)
MCV: 91.6 fL (ref 80.0–100.0)
Platelets: 249 K/uL (ref 150–400)
RBC: 4.15 MIL/uL — ABNORMAL LOW (ref 4.22–5.81)
RDW: 13 % (ref 11.5–15.5)
WBC: 6.6 K/uL (ref 4.0–10.5)
nRBC: 0 % (ref 0.0–0.2)

## 2024-06-22 LAB — BASIC METABOLIC PANEL WITH GFR
Anion gap: 10 (ref 5–15)
BUN: 11 mg/dL (ref 8–23)
CO2: 26 mmol/L (ref 22–32)
Calcium: 8.9 mg/dL (ref 8.9–10.3)
Chloride: 95 mmol/L — ABNORMAL LOW (ref 98–111)
Creatinine, Ser: 0.96 mg/dL (ref 0.61–1.24)
GFR, Estimated: >60 mL/min (ref >60)
Glucose, Bld: 96 mg/dL (ref 70–99)
Potassium: 4.9 mmol/L (ref 3.5–5.1)
Sodium: 131 mmol/L — ABNORMAL LOW (ref 135–145)

## 2024-06-22 MED ORDER — LIDOCAINE-EPINEPHRINE 1 %-1:100000 IJ SOLN
INTRAMUSCULAR | Status: AC
  Filled 2024-06-22: qty 1

## 2024-06-22 MED ORDER — SODIUM CHLORIDE 0.9 % IV SOLN: INTRAVENOUS | Status: DC

## 2024-06-22 NOTE — Sedation Documentation (Signed)
 Patient verbalizes understanding of discharge instructions. No further questions at this time. Vital signs stable and patient ambulatory at discharge.

## 2024-06-22 NOTE — Sedation Documentation (Signed)
 Patient moved over to procedure table - unable to lay flat due to respiratory status. O2 sats 87% - once patient sits up O2 Sats in 90s.   Dr. Jenna called bedside. Procedure canceled at this time due to patient unable to lay flat.

## 2024-06-22 NOTE — Progress Notes (Signed)
 Patient ID: Edward [name], male   DOB: [birth year], 73 y.o.   MRN: [id number] Patient's planned left gastric artery mesenteric angiogram and possible emobolization case for today is cancelled due to significant dyspnea and hypoxia when attempting to lay flat on IR table.  Pt has upcoming appointment with pulmonology due to his chronic dyspnea. Dr. Jenna requests to see patient back in clinic in 3 months for re-evaluation, outpatient follow up order placed today. Will likely need anesthesia for potential re-attempt in the future.   Kimble DEL Imberly Troxler PA-C 06/22/2024 9:20 AM

## 2024-06-22 NOTE — H&P (Signed)
 Chief Complaint: Left gastric artery aneurysm - IR consulted for mesenteric angiogram and possible embolization   Referring Provider(s): Matthews,Kacie Sue-Ellen   Supervising Physician: Jenna Hacker  Patient Status: [facility] - Out-pt  History of Present Illness: Edward [name] is a 73 y.o. male with pmhx of HFpEF (EF 45-50%), bradycardia, aortic aneurysm, HTN, HLD, CVA s/p TPA in 2016, BPH, and recent admission for HF and SOB with hypoxia. Pt initially saw Dr. 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 Discussed with patient at clinic concern for, although small aneurysm, possible rupture. Patient agreed to proceed with mesenteric angiogram and possible embolization, for which he presents today.  Today patient reports some dyspnea, which is chronic and at baseline - recent admission for this. No other new complaints/concerns. Has been NPO since midnight.   Patient is Full Code  Past Medical History:  Diagnosis Date   Abnormal EKG 05/29/2018   Sinus bradycardia and LAFB with TWI V4-V6, III and aVF   Benign essential HTN 05/29/2018   Cardiomyopathy (HCC)    a. dx by echo 07/2020.   Dilation of aorta    Environmental allergies    Fracture, ribs    H/O: varicose veins    Hearing loss    Hyperlipidemia LDL goal <70    Multiple food allergies    Rash    Seasonal allergies    Sinusitis    Stroke [facility])    s/p tpa    Past Surgical History:  Procedure Laterality Date   BIOPSY OF SKIN SUBCUTANEOUS TISSUE AND/OR MUCOUS MEMBRANE  05/18/2024   Procedure: BIOPSY, SKIN, SUBCUTANEOUS TISSUE, OR MUCOUS MEMBRANE;  Surgeon: Stacia Glendia BRAVO, MD;  Location: [facility] ENDOSCOPY;  Service: Gastroenterology;;   COLONOSCOPY     ESOPHAGOGASTRODUODENOSCOPY N/A 05/18/2024   Procedure: EGD (ESOPHAGOGASTRODUODENOSCOPY);  Surgeon: Stacia Glendia BRAVO, MD;   Location: [facility] ENDOSCOPY;  Service: Gastroenterology;  Laterality: N/A;   INGUINAL HERNIA REPAIR Left    IR RADIOLOGIST EVAL & MGMT  05/23/2024   right hand surgery      Allergies: Patient has no known allergies.  Medications: Prior to Admission medications   Medication Sig Start Date End Date Taking? Authorizing Provider  albuterol  (VENTOLIN  HFA) 108 (90 Base) MCG/ACT inhaler Inhale 2 puffs into the lungs every 4 (four) hours as needed for wheezing or shortness of breath. 05/27/24  Yes Edgardo Pontiff, DO  aspirin  EC 81 MG tablet Take 81 mg by mouth at bedtime. Swallow whole.   Yes [provider]  cetirizine  (ZYRTEC  ALLERGY) 10 MG tablet Take 1 tablet (10 mg total) by mouth at bedtime. 05/30/24 05/30/25 Yes Azadegan, Maryam, MD  Coenzyme Q10 (COQ-10) 100 MG CAPS Take 1 capsule by mouth in the morning and at bedtime. 11/02/21  Yes [provider]  finasteride  (PROSCAR ) 5 MG tablet TAKE 1 TABLET (5 MG TOTAL) BY MOUTH DAILY. 01/09/24 01/08/25 Yes Arellano Zameza, Priscila, MD  metoprolol  succinate (TOPROL -XL) 25 MG 24 hr tablet TAKE 1 TABLET (25 MG) BY MOUTH DAILY. APPOINTMENT NEEDED FOR CONTINUATION OF REFILLS-1ST ATTEMPT Patient taking differently: Take 25 mg by mouth at bedtime. 01/24/24  Yes Turner, Wilbert SAUNDERS, MD  Multiple Vitamin (MULTIVITAMIN WITH MINERALS) TABS tablet Take 1 tablet by mouth daily.   Yes [provider]  N-ACETYL CYSTEINE 600 MG TABS Take 1 tablet by mouth at bedtime. 11/02/21  Yes [provider]  omeprazole  (PRILOSEC) 10 MG capsule Take 10 mg by mouth daily as needed.   Yes [provider]  OVER THE COUNTER MEDICATION Take 1 capsule by mouth at bedtime. PQQ: Pyrroloquinoline Quinone   Yes [provider]  RA CRANBERRY 500 MG CAPS Take 1 capsule by mouth in the morning and at bedtime. 11/02/21  Yes [provider]  rosuvastatin  (CRESTOR ) 10 MG tablet TAKE 1 TABLET BY MOUTH EVERY DAY Patient taking differently: Take 10 mg by  mouth 4 (four) times a week. 12/20/23  Yes Arellano Zameza, Priscila, MD  spironolactone  (ALDACTONE ) 25 MG tablet Take 0.5 tablets (12.5 mg total) by mouth daily. 05/31/24  Yes Azadegan, Maryam, MD  tamsulosin  (FLOMAX ) 0.4 MG CAPS capsule TAKE 1 CAPSULE BY MOUTH EVERY DAY Patient taking differently: Take 0.4 mg by mouth at bedtime. 01/24/24  Yes Arellano Zameza, Priscila, MD  acetaminophen  (TYLENOL ) 650 MG CR tablet Take 1,300 mg by mouth every 8 (eight) hours as needed for pain.    [provider]  budesonide-formoterol (SYMBICORT) 80-4.5 MCG/ACT inhaler Inhale 2 puffs into the lungs 2 (two) times daily as needed for up to 1 dose. 05/30/24   Azadegan, Maryam, MD  fluticasone  (FLONASE ) 50 MCG/ACT nasal spray Place 1 spray into both nostrils daily. 05/30/24 06/29/24  Azadegan, Maryam, MD  losartan (COZAAR) 25 MG tablet Take 1 tablet (25 mg total) by mouth daily. 06/14/24 09/12/24  Colletta Manuelita Garre, PA-C  pantoprazole  (PROTONIX ) 40 MG tablet Take 1 tablet (40 mg total) by mouth daily. Patient not taking: Reported on 06/14/2024 06/11/24   Norrine Sharper, MD  sodium chloride  1 g tablet Take 2 tablets (2 g total) by mouth 2 (two) times daily with a meal. Patient not taking: Reported on 06/14/2024 05/30/24   Bernadine Manos, MD     Family History  Problem Relation Age of Onset   CVA Mother    Heart failure Father        CHF   Brain cancer Brother        GBM   Colon polyps Neg Hx    Colon cancer Neg Hx    Esophageal cancer Neg Hx    Rectal cancer Neg Hx    Stomach cancer Neg Hx     Social History   Socioeconomic History   Marital status: Married    Spouse name: Dorothyann   Number of children: 2   Years of education: Not on file   Highest education level: Bachelor's degree (e.g., BA, AB, BS)  Occupational History   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  Tobacco Use   Smoking status: Former   Smokeless tobacco: Never   Tobacco comments:    quit 40+ years ago   Vaping Use   Vaping status: Never Used  Substance and Sexual Activity   Alcohol use: Not Currently   Drug use: Never   Sexual activity: Yes    Partners: Female  Other Topics Concern   Not on file  Social History Narrative   Not on file   Social Drivers of Health   Financial Resource Strain: Low Risk  (05/30/2024)   Overall Financial Resource Strain (CARDIA)    Difficulty of Paying Living Expenses: Not very hard  Food Insecurity: No Food Insecurity (05/31/2024)   Hunger Vital Sign    Worried About Running Out of Food in the Last Year: Never true    Ran Out of Food in the Last Year: Never true  Transportation Needs: No Transportation Needs (05/31/2024)  PRAPARE - Administrator, Civil Service (Medical): No    Lack of Transportation (Non-Medical): No  Physical Activity: Sufficiently Active (01/19/2023)   Exercise Vital Sign    Days of Exercise per Week: 7 days    Minutes of Exercise per Session: 30 min  Stress: No Stress Concern Present (01/19/2023)   [employer] of Occupational Health - Occupational Stress Questionnaire    Feeling of Stress : Not at all  Social Connections: Socially Integrated (05/28/2024)   Social Connection and Isolation Panel    Frequency of Communication with Friends and Family: More than three times a week    Frequency of Social Gatherings with Friends and Family: More than three times a week    Attends Religious Services: More than 4 times per year    Active Member of [employer] or Organizations: Yes    Attends Engineer, structural: More than 4 times per year    Marital Status: Married     Review of Systems: A 12 point ROS discussed and pertinent positives are indicated in the HPI above.  All other systems are negative.   Vital Signs: BP (!) 129/94   Pulse 69   Temp 97.7 F (36.5 C) (Oral)   Ht 6' (1.829 m)   Wt 204 lb (92.5 kg)   SpO2 94%   BMI 27.67 kg/m   Advance Care Plan: No documents on file  Physical Exam Vitals  and nursing note reviewed.  Constitutional:      General: He is not in acute distress. HENT:     Mouth/Throat:     Mouth: Mucous membranes are moist.     Pharynx: Oropharynx is clear.  Cardiovascular:     Rate and Rhythm: Normal rate and regular rhythm.  Pulmonary:     Breath sounds: Normal breath sounds.     Comments: CTAB. Increased work of breathing noted at bedside - this is at baseline for patient per him and wife. O2 95% on RA. Abdominal:     Palpations: Abdomen is soft.     Tenderness: There is no abdominal tenderness.  Musculoskeletal:     Right lower leg: No edema.     Left lower leg: No edema.  Skin:    General: Skin is warm and dry.  Neurological:     Mental Status: He is alert and oriented to person, place, and time. Mental status is at baseline.     Imaging: IR Radiologist Eval & Mgmt Result Date: 06/08/2024 EXAM: NEW PATIENT OFFICE VISIT CHIEF COMPLAINT: See below HISTORY OF PRESENT ILLNESS: See below REVIEW OF SYSTEMS: See below PHYSICAL EXAMINATION: See below ASSESSMENT AND PLAN: Please refer to completed note in the electronic medical record on [facility] Cathlyn Banner, MD [facility] Electronically Signed   By: Cordella Banner   On: 06/08/2024 12:37   CT Angio Chest PE W/Cm &/Or Wo Cm Result Date: 06/03/2024 CLINICAL DATA:  Provided history: Pulmonary embolism (PE) suspected, high prob Worsening shortness of breath. Recent hospitalization, discharged 4 days ago. EXAM: CT ANGIOGRAPHY CHEST WITH CONTRAST TECHNIQUE: Multidetector CT imaging of the chest was performed using the standard protocol during bolus administration of intravenous contrast. Multiplanar CT image reconstructions and MIPs were obtained to evaluate the vascular anatomy. RADIATION DOSE REDUCTION: This exam was performed according to the departmental dose-optimization program which includes automated exposure control, adjustment of the mA  and/or kV according to patient size and/or use of iterative reconstruction technique. CONTRAST:  75mL OMNIPAQUE  IOHEXOL  350 MG/ML SOLN COMPARISON:  Radiograph earlier today. Chest CTA 1 week ago 05/27/2024 FINDINGS: Patient was scanned in decubitus positioning. Cardiovascular: There are no filling defects within the pulmonary arteries to suggest pulmonary embolus. The heart is enlarged. Mild aortic atherosclerosis. Stable ascending aortic dilatation of 4.1 cm. No pericardial effusion. Mediastinum/Nodes: No suspicious lymphadenopathy. Decompressed esophagus. No visible thyroid  nodule. Lungs/Pleura: Subpleural ground-glass in the lateral left lower lobe is hypoventilatory 1 scan decubitus. Subpleural opacities in both peri diaphragmatic lower lobes has improved from prior. There is no acute or confluent airspace disease. No significant pleural effusion. No features of pulmonary edema. Upper Abdomen: No acute findings. Musculoskeletal: There are no acute or suspicious osseous abnormalities. Review of the MIP images confirms the above findings. IMPRESSION: 1. No pulmonary embolus. 2. Improved dependent bibasilar atelectasis/consolidation from prior exam. No acute airspace disease. 3. Cardiomegaly without pulmonary edema. 4. Unchanged ascending aortic aneurysm of 4.1 cm. Annual imaging follow-up with CTA or MRA is recommended. Aortic Atherosclerosis (ICD10-I70.0). Electronically Signed   By: Andrea Gasman M.D.   On: 06/03/2024 16:37   DG Chest 2 View Result Date: 06/03/2024 CLINICAL DATA:  sob Reports worsening shortness of breath that started yesterday. Was just discharged on Wednesday. Reports can't lie flat and having difficulty sleeping due to trouble breathing. Patient has to stop speaking to take a breath. EXAM: CHEST - 2 VIEW COMPARISON:  CT angio chest 05/19/2024. FINDINGS: The heart and mediastinal contours are within normal limits. Bibasilar atelectasis. No pulmonary edema. At least bilateral trace  volume pleural effusions. No pneumothorax. No acute osseous abnormality. Atherosclerotic plaque. IMPRESSION: 1.   At least bilateral trace volume pleural effusions. 2. Bibasilar atelectasis with superimposed infection/inflammation not excluded. 3.  Aortic Atherosclerosis (ICD10-I70.0). Electronically Signed   By: Morgane  Naveau M.D.   On: 06/03/2024 13:27   CT Angio Chest PE W and/or Wo Contrast Result Date: 05/27/2024 CLINICAL DATA:  Pulmonary embolism suspected, high probability. Worsening shortness of breath and coughing. EXAM: CT ANGIOGRAPHY CHEST WITH CONTRAST TECHNIQUE: Multidetector CT imaging of the chest was performed using the standard protocol during bolus administration of intravenous contrast. Multiplanar CT image reconstructions and MIPs were obtained to evaluate the vascular anatomy. RADIATION DOSE REDUCTION: This exam was performed according to the departmental dose-optimization program which includes automated exposure control, adjustment of the mA and/or kV according to patient size and/or use of iterative reconstruction technique. CONTRAST:  75mL OMNIPAQUE  IOHEXOL  350 MG/ML SOLN COMPARISON:  05/16/2024. FINDINGS: Cardiovascular: The heart is enlarged and there is a trace pericardial effusion. Scattered coronary artery calcifications are noted. There is atherosclerotic calcification of the aorta with aneurysmal dilatation of the ascending aorta measuring 4.1 cm. The pulmonary trunk is normal in caliber. No evidence of pulmonary embolism is seen. Mediastinum/Nodes: No mediastinal, hilar, or axillary lymphadenopathy. The thyroid  gland, trachea, and esophagus are within normal limits. There is a small hiatal hernia. Lungs/Pleura: Pleuroparenchymal scarring is present at the lung apices bilaterally. There is bibasilar atelectasis with consolidation in the lower lobes bilaterally. No effusion or pneumothorax is seen. The previously described pulmonary nodules are not seen due to superimposed  atelectasis or infiltrate. Upper Abdomen: No acute abnormality. Musculoskeletal: Degenerative changes are present in the thoracic spine. No acute osseous abnormality. Review of the MIP images confirms the above findings. IMPRESSION: 1. No evidence of pulmonary embolism. 2. Bibasilar atelectasis with consolidation in the lower lobes bilaterally, possible atelectasis or infiltrate increased from prior exams. 3. Coronary artery calcifications. 4. Small hiatal hernia. 5.  Aortic atherosclerosis with aneurysmal dilatation of the ascending aorta measuring 4.1 cm. Recommend annual imaging followup by CTA or MRA. This recommendation follows 2010 ACCF/AHA/AATS/ACR/ASA/SCA/SCAI/SIR/STS/SVM Guidelines for the Diagnosis and Management of Patients with Thoracic Aortic Disease. Circulation. 2010; 121: Z733-z630. Aortic aneurysm NOS (ICD10-I71.9) . Electronically Signed   By: Leita Birmingham M.D.   On: 05/27/2024 18:29   DG Chest 2 View Result Date: 05/27/2024 CLINICAL DATA:  Shortness of breath x3 weeks. EXAM: DG CHEST 2V COMPARISON:  May 16, 2024 FINDINGS: The cardiac silhouette is mildly enlarged and unchanged in size. Low lung volumes are noted. Mild, stable areas of linear scarring and/or atelectasis are seen within the bilateral lung bases. No pleural effusion or pneumothorax is identified. The visualized skeletal structures are unremarkable. IMPRESSION: Low lung volumes with mild bibasilar linear scarring and/or atelectasis. Electronically Signed   By: Suzen Dials M.D.   On: 05/27/2024 14:22    Labs:  CBC: Recent Labs    05/27/24 1610 05/29/24 1757 05/30/24 0308 06/03/24 1322  WBC 3.2* 5.0 5.0 5.7  HGB 11.7* 11.7* 11.9* 11.2*  HCT 34.6* 35.5* 35.2* 34.6*  PLT 373 473* 420* 358    COAGS: Recent Labs    05/18/24 0354  INR 1.0    BMP: Recent Labs    05/27/24 1610 05/29/24 1059 05/30/24 0308 06/03/24 1322  NA 134* 135 132* 134*  K 4.7 3.9 3.8 4.0  CL 98 95* 97* 100  CO2 23 30 28 26    GLUCOSE 120* 85 97 93  BUN 9 23 27* 14  CALCIUM  9.5 8.5* 8.2* 8.5*  CREATININE 0.96 1.23 1.20 1.04  GFRNONAA >60 >60 >60 >60    LIVER FUNCTION TESTS: Recent Labs    05/07/24 1346 05/16/24 0739 05/30/24 0308 06/03/24 1322  BILITOT 1.1 1.0 0.9 0.8  AST 55* 27 17 26   ALT 63* 55* 27 33  ALKPHOS 46 69 54 50  PROT 6.5 5.7* 5.8* 6.0*  ALBUMIN 3.7 3.0* 3.2* 3.2*    TUMOR MARKERS: No results for input(s): AFPTM, CEA, CA199, CHROMGRNA in the last 8760 hours.  Assessment and Plan:  Edward [name] is a 73 y.o. male with pmhx of HFpEF (EF 45-50%), bradycardia, aortic aneurysm, HTN, HLD, CVA s/p TPA in 2016, BPH, and recent admission for HF and SOB with hypoxia. Pt initially saw Dr. 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 Discussed with patient at clinic concern for, although small aneurysm, possible rupture. Patient agreed to proceed with mesenteric angiogram and possible embolization, for which he presents today.   The Risks and benefits of left gastric mesenteric aneurysm angiogram and possible embolization were discussed with the patient including, but not limited to bleeding, infection, vascular injury, post operative pain, or contrast induced renal failure.  This procedure involves the use of X-rays and because of the nature of the planned procedure, it is possible that we will have prolonged use of X-ray fluoroscopy.  Potential radiation risks to you include (but are not limited to) the following: - A slightly elevated risk for cancer several years later in life. This risk is typically less than 0.5% percent. This risk is low in comparison to the normal incidence of human cancer, which is 33% for women and 50% for men according to the American Cancer Society. - Radiation induced injury can include skin redness, resembling a  rash, tissue breakdown /  ulcers and hair loss (which can be temporary or permanent).   The likelihood of either of these occurring depends on the difficulty of the procedure and whether you are sensitive to radiation due to previous procedures, disease, or genetic conditions.   IF your procedure requires a prolonged use of radiation, you will be notified and given written instructions for further action.  It is your responsibility to monitor the irradiated area for the 2 weeks following the procedure and to notify your physician if you are concerned that you have suffered a radiation induced injury.    All of the patient's questions were answered, patient is agreeable to proceed. Consent signed and in chart.   Thank you for allowing our service to participate in STOKES RATTIGAN 's care.  Electronically Signed: Kimble VEAR Clas, PA-C   06/22/2024, 8:32 AM      I spent a total of  30 Minutes   in face to face in clinical consultation, greater than 50% of which was counseling/coordinating care for left gastric mesenteric aneurysm and possible embolization

## 2024-06-27 ENCOUNTER — Telehealth: Payer: Self-pay | Admitting: Gastroenterology

## 2024-06-27 NOTE — Telephone Encounter (Signed)
 Inbound call from patient stating he would like to speak to nurse in regards to ultrasound scheduled for tomorrow 06/28/24, patient states when he lays on his back his breathing shuts down and due to ultrasound would like to be advised on what to do  Requesting a call back   Please advise  Thank you

## 2024-06-27 NOTE — Telephone Encounter (Signed)
 The pt has been advised to let the tech know tomorrow at the appt and they can make him comfortable.  The pt has been advised of the information and verbalized understanding.

## 2024-06-28 ENCOUNTER — Ambulatory Visit

## 2024-06-28 ENCOUNTER — Ambulatory Visit: Admitting: Internal Medicine

## 2024-06-28 ENCOUNTER — Encounter: Payer: Self-pay | Admitting: Internal Medicine

## 2024-06-28 ENCOUNTER — Ambulatory Visit (HOSPITAL_COMMUNITY)
Admission: RE | Admit: 2024-06-28 | Discharge: 2024-06-28 | Disposition: A | Discharge status: Home / Self Care | Source: Ambulatory Visit | Attending: Gastroenterology | Admitting: Gastroenterology

## 2024-06-28 VITALS — BP 142/80 | HR 67 | Ht 72.0 in | Wt 208.6 lb

## 2024-06-28 DIAGNOSIS — R0601 Orthopnea: Secondary | ICD-10-CM | POA: Diagnosis not present

## 2024-06-28 DIAGNOSIS — R0609 Other forms of dyspnea: Secondary | ICD-10-CM

## 2024-06-28 DIAGNOSIS — J984 Other disorders of lung: Secondary | ICD-10-CM | POA: Diagnosis not present

## 2024-06-28 DIAGNOSIS — Z8679 Personal history of other diseases of the circulatory system: Secondary | ICD-10-CM | POA: Diagnosis not present

## 2024-06-28 DIAGNOSIS — R069 Unspecified abnormalities of breathing: Secondary | ICD-10-CM

## 2024-06-28 DIAGNOSIS — K859 Acute pancreatitis without necrosis or infection, unspecified: Secondary | ICD-10-CM | POA: Diagnosis not present

## 2024-06-28 DIAGNOSIS — I509 Heart failure, unspecified: Secondary | ICD-10-CM | POA: Diagnosis not present

## 2024-06-28 DIAGNOSIS — E871 Hypo-osmolality and hyponatremia: Secondary | ICD-10-CM

## 2024-06-28 DIAGNOSIS — R0600 Dyspnea, unspecified: Secondary | ICD-10-CM | POA: Diagnosis not present

## 2024-06-28 NOTE — Patient Instructions (Addendum)
 ICD-10-CM   1. Orthopnea  R06.01     2. DOE (dyspnea on exertion)  R06.09     3. History of chronic CHF  Z86.79     4. 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

## 2024-06-28 NOTE — Progress Notes (Signed)
 OV 06/28/2024  Subjective:  Patient ID: Edward [name], male , DOB: 07-Aug-1951 , age 73 y.o. , MRN: [id number] , ADDRESS: [address] [place] [address] PCP Norrine Sharper, MD Patient Care Team: Norrine Sharper, MD as PCP - General (Internal Medicine) Arnell Rockney PARAS, [facility] (Inactive) (Pharmacist)  This Provider for this visit: Treatment Team:  Attending Provider: Jude Harden GAILS, MD    06/28/2024 -   Chief Complaint  Patient presents with   Consult   Shortness of Breath    Referred by Dr. Mliss Foot. Pt c/o SOB that started in Sept 2025 that is progressively getting worse. He can not lie flat due to SOB.      HPI Edward [name] is a 73 year old male with heart failure who presents with worsening shortness of breath.  At baseline he works in a [employer].  In 2016 he suffered a stroke and got tPA and since then no deficits.  At baseline he also sleeps on his side as a habit.  He never sleeps on his back.  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  He was originally admitted for 5 days ending May 12, 2024 to the teaching service for low sodium that was felt secondary to UTI.  He was given Macrobid .  SIADH was thought the etiology.  There was findings of pancreatitis on his CT scan abdomen.  His sodium at admission on 69/8/25 was 115 [latest 06/22/2024 is 131]    He was admitted for 2 days ending May 18, 2024 for gastric artery aneurysm.  This was after he presented with epigastric pain.  CT at admission showed diverticulosis but no diverticulitis.  EGD showed gastritis.  There is a gastric artery aneurysm found incidentally on CT abdomen.  The CT also showed signs of possible vasculitis but hospitalist team found normal CRP and ESR.  They recommended outpatient angiogram..  Lipase on this admission was normal.  Yeah  He had a third admission for 3 days ending May 30, 2024 for acute respiratory failure with  hypoxemia with a diagnosis of acute systolic heart failure and hyperreactive airways.  According to the patient for this admission even though CHF was considered and he was aggressively diuresed and made him lose weight he does not believe the admission was because of heart failure because he did not feel his heart was acting up.  In fact chart review shows that they did not think he was fluid overloaded.  However his BNP at admission was elevated at 516 and a day later normalized to 64.  But he was hypoxemic and his oxygenation improved with bronchodilator therapy and incentive spirometry.  Otherwise no other echo this admission.  Last echo was January 2025 which showed EF of 45% [baseline EF 35% in 2021 and 2023).  He has been experiencing significant shortness of breath since early September, which worsened towards the end of the month and has persisted throughout October. The shortness of breath is moderate when sitting up and severe when lying on his back, significantly impacting his ability to undergo medical procedures such as an angiogram and ultrasound. The severity has remained constant for about three weeks.  Overall progressive.  He is unable to lie flat at all.  He is dyspnea is described as severe when he lies supine.  It is moderate when he is sitting.  In fact in the office when we made him  lie flat in a matter of 2 or 3 seconds he became quite dyspneic.  I observed paradoxical respiration and he just stood up.  During this time his pulse ox did not [name]. Edward [name]  Then on 06/22/2024 showed up at IR for mesenteric angiogram but because of the orthopnea this was canceled - accoding to his history  Per him: No wheezing, cough, leg swelling, hemoptysis, fever, chills, or recent trauma.SABRA He reports experiencing indigestion and frequent burping but denies any hoarseness or swallowing difficulties.  Also denies any trauma to the chest.  No previous bypass  no MVA.  No chiropractor.  No wheezing.  No edema no hemoptysis no fever no chills no nausea vomiting diarrhea.  No hoarseness of the voice.  Socially, he has been working full-time for the past two weeks after limiting his work hours following his hospital stay. He has a long-standing habit of sleeping on his side or stomach, which he finds comfortable.      CT Chest data from date: *oct 2025  - personally visualized and independently interpreted : yes - my findings are: agree   of the MIP images confirms the above findings.   IMPRESSION: 1. No pulmonary embolus. 2. Improved dependent bibasilar atelectasis/consolidation from prior exam. No acute airspace disease. 3. Cardiomegaly without pulmonary edema. 4. Unchanged ascending aortic aneurysm of 4.1 cm. Annual imaging follow-up with CTA or MRA is recommended.   Aortic Atherosclerosis (ICD10-I70.0).     Electronically Signed   By: Andrea Gasman M.D.   On: 06/03/2024 16:37 PFT      No data to display         Simple office walk 224 (66+46 x 2) feet Pod A at [facility] x  3 laps goal with forehead probe 06/28/2024  06/28/2024   O2 used ra ra  Number laps completed 2 of 3 -he walked with a finger probe 2 of 3 forehead probe  Comments about pace steady   Resting Pulse Ox/HR 93% and 70/min 97% and HR 71  Final Pulse Ox/HR 79% and 96/min (good signat) 95% and HR 99  Desaturated </= 88% yes no  Desaturated <= 3% points yes no  Got Tachycardic >/= 90/min yes yes  Symptoms at end of test dyspneic dyspneic  Miscellaneous comments Impoved upoin sitting      eCHO JAn 2025   IMPRESSIONS     1. Left ventricular ejection fraction, by estimation, is 45 to 50%. The  left ventricle has mildly decreased function. The left ventricle  demonstrates global hypokinesis. Left ventricular diastolic parameters are  consistent with Grade I diastolic  dysfunction (impaired relaxation).   2. Right ventricular systolic function is  normal. The right ventricular  size is mildly enlarged.   3. The mitral valve is normal in structure. Trivial mitral valve  regurgitation. No evidence of mitral stenosis.   4. The aortic valve is normal in structure. Aortic valve regurgitation is  mild. Aortic valve sclerosis/calcification is present, without any  evidence of aortic stenosis. Aortic regurgitation PHT measures 554 msec.  Aortic valve area, by VTI measures 2.48   cm. Aortic valve mean gradient measures 3.0 mmHg. Aortic valve Vmax  measures 1.25 m/s.   5. The inferior vena cava is normal in size with greater than 50%  respiratory variability, suggesting right atrial pressure of 3 mmHg.   6. Although the Aortic root dimension is 4.5cm and Ascending aorta  measures 4cm, the Ascending aorta  and aortic root measurements are within  normal limits for age when indexed to body surface area and sex.   Comparison(s): EF 35-40%, global hypokinesis worse in the mid to distal  septum.   LAB RESULTS last 96 hours No results found.    Latest Reference Range & Units 05/07/24 13:50 05/28/24 16:10 06/03/24 13:22  B Natriuretic Peptide 0.0 - 100.0 pg/mL 195.0 (H) 63.8 75.9  (H): Data is abnormally high   Latest Reference Range & Units 05/24/19 12:42 09/02/21 11:05 12/22/21 15:35 05/05/24 23:03 05/07/24 13:46 05/07/24 14:07 05/08/24 04:43 05/09/24 02:25 05/10/24 02:57 05/11/24 04:04 05/16/24 07:39 05/16/24 07:45 05/17/24 01:58 05/18/24 03:54 05/27/24 16:10 05/29/24 17:57 05/30/24 03:08 06/03/24 13:22 06/22/24 08:22  Hemoglobin 13.0 - 17.0 g/dL 86.0 86.1 85.6 86.1 85.5 15.0 13.1 12.7 (L) 13.6 12.5 (L) 11.5 (L) 11.2 (L) 9.7 (L) 10.2 (L) 11.7 (L) 11.7 (L) 11.9 (L) 11.2 (L) 12.6 (L)  (L): Data is abnormally low    has a past medical history of Abnormal EKG (05/29/2018), Benign essential HTN (05/29/2018), Cardiomyopathy (HCC), Dilation of aorta, Environmental allergies, Fracture, ribs, H/O: varicose veins, Hearing loss, Hyperlipidemia LDL goal  <70, Multiple food allergies, Rash, Seasonal allergies, Sinusitis, and Stroke (HCC).   reports that he has quit smoking. He has never used smokeless tobacco.  Past Surgical History:  Procedure Laterality Date   BIOPSY OF SKIN SUBCUTANEOUS TISSUE AND/OR MUCOUS MEMBRANE  05/18/2024   Procedure: BIOPSY, SKIN, SUBCUTANEOUS TISSUE, OR MUCOUS MEMBRANE;  Surgeon: Stacia Glendia BRAVO, MD;  Location[facility] ENDOSCOPY;  Service: Gastroenterology;;   COLONOSCOPY     ESOPHAGOGASTRODUODENOSCOPY N/A 05/18/2024   Procedure: EGD (ESOPHAGOGASTRODUODENOSCOPY);  Surgeon: Stacia Glendia BRAVO, MD;  Location: [facility] ENDOSCOPY;  Service: Gastroenterology;  Laterality: N/A;   INGUINAL HERNIA REPAIR Left    IR FLUORO RM 30-60 MIN  06/22/2024   IR RADIOLOGIST EVAL & MGMT  05/23/2024   right hand surgery      No Known Allergies  Immunization History  Administered Date(s) Administered   Fluad Quad(high Dose 65+) 06/11/2020   Paulino (J&J) SARS-COV-2 Vaccination 12/29/2019   Pneumococcal Conjugate-13 05/24/2019   Pneumococcal-Unspecified 06/29/2020    Family History  Problem Relation Age of Onset   CVA Mother    Heart failure Father        CHF   Brain cancer Brother        GBM   Colon polyps Neg Hx    Colon cancer Neg Hx    Esophageal cancer Neg Hx    Rectal cancer Neg Hx    Stomach cancer Neg Hx      Current Outpatient Medications:    acetaminophen  (TYLENOL ) 650 MG CR tablet, Take 1,300 mg by mouth every 8 (eight) hours as needed for pain., Disp: , Rfl:    albuterol  (VENTOLIN  HFA) 108 (90 Base) MCG/ACT inhaler, Inhale 2 puffs into the lungs every 4 (four) hours as needed for wheezing or shortness of breath., Disp: , Rfl:    aspirin  EC 81 MG tablet, Take 81 mg by mouth at bedtime. Swallow whole., Disp: , Rfl:    budesonide-formoterol (SYMBICORT) 80-4.5 MCG/ACT inhaler, Inhale 2 puffs into the lungs 2 (two) times daily as needed for up to 1 dose. (Patient taking differently: Inhale 2 puffs into the lungs at  bedtime.), Disp: 10.2 g, Rfl: 0   cetirizine  (ZYRTEC  ALLERGY) 10 MG tablet, Take 1 tablet (10 mg total) by mouth at bedtime., Disp: 15 tablet, Rfl: 0   Coenzyme Q10 (COQ-10) 100 MG CAPS, Take 1  capsule by mouth in the morning and at bedtime., Disp: , Rfl:    finasteride  (PROSCAR ) 5 MG tablet, TAKE 1 TABLET (5 MG TOTAL) BY MOUTH DAILY., Disp: 90 tablet, Rfl: 1   fluticasone  (FLONASE ) 50 MCG/ACT nasal spray, Place 1 spray into both nostrils daily., Disp: 16 g, Rfl: 0   losartan (COZAAR) 25 MG tablet, Take 1 tablet (25 mg total) by mouth daily., Disp: 90 tablet, Rfl: 0   metoprolol  succinate (TOPROL -XL) 25 MG 24 hr tablet, TAKE 1 TABLET (25 MG) BY MOUTH DAILY. APPOINTMENT NEEDED FOR CONTINUATION OF REFILLS-1ST ATTEMPT, Disp: 90 tablet, Rfl: 3   Multiple Vitamin (MULTIVITAMIN WITH MINERALS) TABS tablet, Take 1 tablet by mouth daily., Disp: , Rfl:    N-ACETYL CYSTEINE 600 MG TABS, Take 1 tablet by mouth at bedtime., Disp: , Rfl:    omeprazole  (PRILOSEC) 10 MG capsule, Take 10 mg by mouth daily as needed., Disp: , Rfl:    OVER THE COUNTER MEDICATION, Take 1 capsule by mouth at bedtime. PQQ: Pyrroloquinoline Quinone, Disp: , Rfl:    RA CRANBERRY 500 MG CAPS, Take 1 capsule by mouth in the morning and at bedtime., Disp: , Rfl:    rosuvastatin  (CRESTOR ) 10 MG tablet, TAKE 1 TABLET BY MOUTH EVERY DAY, Disp: 90 tablet, Rfl: 1   spironolactone  (ALDACTONE ) 25 MG tablet, Take 0.5 tablets (12.5 mg total) by mouth daily., Disp: 30 tablet, Rfl: 0   tamsulosin  (FLOMAX ) 0.4 MG CAPS capsule, TAKE 1 CAPSULE BY MOUTH EVERY DAY, Disp: 90 capsule, Rfl: 1      Objective:   Vitals:   06/28/24 1556 06/28/24 1618  BP: 124/74 (!) 142/80  Pulse: 67   SpO2: 93%   Weight: 208 lb 9.6 oz (94.6 kg)   Height: 6' (1.829 m)     Estimated body mass index is 28.29 kg/m as calculated from the following:   Height as of this encounter: 6' (1.829 m).   Weight as of this encounter: 208 lb 9.6 oz (94.6  kg).  @WEIGHTCHANGE @  Filed Weights   06/28/24 1556  Weight: 208 lb 9.6 oz (94.6 kg)     Physical Exam   General: No distress. Lean. Mild deconditiond O2 at rest: no Cane present: no Sitting in wheel chair: no Frail: ye ssome Obese: no Neuro: Alert and Oriented x 3. GCS 15. Speech normal Psych: Pleasant Resp:  Barrel Chest - no.  Wheeze - no, Crackles - no BUT DIMINISHED BASES. GOT VERY DYSPNEIC WHEN LYING FLAT and HAD PARADOXICAL RESPIRATION, No overt respiratory distress CVS: Normal heart sounds. Murmurs - no Ext: Stigmata of Connective Tissue Disease - no HEENT: Normal upper airway. PEERL +. No post nasal drip        Assessment/     Assessment & Plan Orthopnea  DOE (dyspnea on exertion)  History of chronic CHF  Paradoxical respiration  Hyponatremia    PLAN Patient Instructions     ICD-10-CM   1. Orthopnea  R06.01     2. DOE (dyspnea on exertion)  R06.09     3. History of chronic CHF  Z86.79     4. Paradoxical respiration  R06.9      Puzzling constellation, Concer is diaphragmatic paralysis  Plan   - sniff test (rule out dipahragmatic dysfunction) - do cxr 2 view (Cannot do CT chest due to severe orthopnea( - refer ENT  - do blood work for cbc, bmet, bnp - need another echo (last Jan 2025)  - keep appt echo 07/05/24 -  if worse go to ER - cintinue symbicort   Followu 4 weeks with APP    FOLLOWUP    Return for 4 weeks with APP.    SIGNATURE    Dr. Dorethia Cave, M.D., F.C.C.P,  Pulmonary and Critical Care Medicine Staff Physician, [facility] Center Director - Interstitial Lung Disease  Program  Pulmonary Fibrosis [facility] at [facility] [place], [place], [address]  Pager: [phone], If no answer or between  15:00h - 7:00h: call [phone] Telephone: [phone]  4:54 PM 06/28/2024

## 2024-06-29 ENCOUNTER — Other Ambulatory Visit (INDEPENDENT_AMBULATORY_CARE_PROVIDER_SITE_OTHER)

## 2024-06-29 DIAGNOSIS — R0601 Orthopnea: Secondary | ICD-10-CM | POA: Diagnosis not present

## 2024-06-29 DIAGNOSIS — Z8679 Personal history of other diseases of the circulatory system: Secondary | ICD-10-CM | POA: Diagnosis not present

## 2024-06-29 DIAGNOSIS — E871 Hypo-osmolality and hyponatremia: Secondary | ICD-10-CM | POA: Diagnosis not present

## 2024-06-29 DIAGNOSIS — R0609 Other forms of dyspnea: Secondary | ICD-10-CM

## 2024-06-29 DIAGNOSIS — R069 Unspecified abnormalities of breathing: Secondary | ICD-10-CM

## 2024-06-29 LAB — CBC WITH DIFFERENTIAL/PLATELET
Basophils Absolute: 0.1 K/uL (ref 0.0–0.1)
Basophils Relative: 0.8 % (ref 0.0–3.0)
Eosinophils Absolute: 0.5 K/uL (ref 0.0–0.7)
Eosinophils Relative: 6.9 % — ABNORMAL HIGH (ref 0.0–5.0)
HCT: 38.1 % — ABNORMAL LOW (ref 39.0–52.0)
Hemoglobin: 12.9 g/dL — ABNORMAL LOW (ref 13.0–17.0)
Lymphocytes Relative: 17 % (ref 12.0–46.0)
Lymphs Abs: 1.2 K/uL (ref 0.7–4.0)
MCHC: 33.9 g/dL (ref 30.0–36.0)
MCV: 89.1 fl (ref 78.0–100.0)
Monocytes Absolute: 0.7 K/uL (ref 0.1–1.0)
Monocytes Relative: 10.6 % (ref 3.0–12.0)
Neutro Abs: 4.4 K/uL (ref 1.4–7.7)
Neutrophils Relative %: 64.7 % (ref 43.0–77.0)
Platelets: 296 K/uL (ref 150.0–400.0)
RBC: 4.27 Mil/uL (ref 4.22–5.81)
RDW: 13.3 % (ref 11.5–15.5)
WBC: 6.8 K/uL (ref 4.0–10.5)

## 2024-06-29 LAB — COMPREHENSIVE METABOLIC PANEL WITH GFR
ALT: 25 U/L (ref 0–53)
AST: 23 U/L (ref 0–37)
Albumin: 4.2 g/dL (ref 3.5–5.2)
Alkaline Phosphatase: 57 U/L (ref 39–117)
BUN: 13 mg/dL (ref 6–23)
CO2: 29 meq/L (ref 19–32)
Calcium: 9 mg/dL (ref 8.4–10.5)
Chloride: 94 meq/L — ABNORMAL LOW (ref 96–112)
Creatinine, Ser: 0.94 mg/dL (ref 0.40–1.50)
GFR: 81.49 mL/min (ref >60.00)
Glucose, Bld: 94 mg/dL (ref 70–99)
Potassium: 4.2 meq/L (ref 3.5–5.1)
Sodium: 129 meq/L — ABNORMAL LOW (ref 135–145)
Total Bilirubin: 0.8 mg/dL (ref 0.2–1.2)
Total Protein: 6.7 g/dL (ref 6.0–8.3)

## 2024-06-29 LAB — BRAIN NATRIURETIC PEPTIDE: Pro B Natriuretic peptide (BNP): 65 pg/mL (ref 0.0–100.0)

## 2024-07-02 ENCOUNTER — Ambulatory Visit (HOSPITAL_COMMUNITY)
Admission: RE | Admit: 2024-07-02 | Discharge: 2024-07-02 | Disposition: A | Discharge status: Home / Self Care | Source: Ambulatory Visit | Attending: Internal Medicine | Admitting: Internal Medicine

## 2024-07-02 DIAGNOSIS — Z8679 Personal history of other diseases of the circulatory system: Secondary | ICD-10-CM | POA: Insufficient documentation

## 2024-07-02 DIAGNOSIS — R069 Unspecified abnormalities of breathing: Secondary | ICD-10-CM | POA: Insufficient documentation

## 2024-07-02 DIAGNOSIS — E871 Hypo-osmolality and hyponatremia: Secondary | ICD-10-CM | POA: Diagnosis not present

## 2024-07-02 DIAGNOSIS — R0601 Orthopnea: Secondary | ICD-10-CM | POA: Diagnosis not present

## 2024-07-02 DIAGNOSIS — R0609 Other forms of dyspnea: Secondary | ICD-10-CM | POA: Insufficient documentation

## 2024-07-05 ENCOUNTER — Ambulatory Visit: Payer: Self-pay | Admitting: Gastroenterology

## 2024-07-05 ENCOUNTER — Ambulatory Visit (HOSPITAL_COMMUNITY)
Admission: RE | Admit: 2024-07-05 | Discharge: 2024-07-05 | Disposition: A | Discharge status: Home / Self Care | Source: Ambulatory Visit | Attending: Physician Assistant | Admitting: Physician Assistant

## 2024-07-05 DIAGNOSIS — I77810 Thoracic aortic ectasia: Secondary | ICD-10-CM | POA: Diagnosis not present

## 2024-07-05 DIAGNOSIS — I5022 Chronic systolic (congestive) heart failure: Secondary | ICD-10-CM | POA: Insufficient documentation

## 2024-07-05 DIAGNOSIS — I517 Cardiomegaly: Secondary | ICD-10-CM | POA: Insufficient documentation

## 2024-07-05 LAB — BASIC METABOLIC PANEL WITH GFR
Anion gap: 8 (ref 5–15)
BUN: 12 mg/dL (ref 8–23)
CO2: 28 mmol/L (ref 22–32)
Calcium: 8.7 mg/dL — ABNORMAL LOW (ref 8.9–10.3)
Chloride: 95 mmol/L — ABNORMAL LOW (ref 98–111)
Creatinine, Ser: 1.06 mg/dL (ref 0.61–1.24)
GFR, Estimated: >60 mL/min
Glucose, Bld: 97 mg/dL (ref 70–99)
Potassium: 4.2 mmol/L (ref 3.5–5.1)
Sodium: 131 mmol/L — ABNORMAL LOW (ref 135–145)

## 2024-07-05 NOTE — Progress Notes (Signed)
  Echocardiogram 2D Echocardiogram has been performed.  Norleen ORN [facility] 07/05/2024, 3:28 PM

## 2024-07-06 ENCOUNTER — Ambulatory Visit (HOSPITAL_COMMUNITY): Payer: Self-pay | Admitting: Physician Assistant

## 2024-07-06 LAB — ECHOCARDIOGRAM COMPLETE
AR max vel: 4.53 cm2
AV Area VTI: 4.94 cm2
AV Area mean vel: 4.54 cm2
AV Mean grad: 3 mmHg
AV Peak grad: 5.3 mmHg
Ao pk vel: 1.15 m/s
Area-P 1/2: 3.77 cm2
Calc EF: 49 %
MV VTI: 4.7 cm2
S' Lateral: 3.4 cm
Single Plane A2C EF: 53 %
Single Plane A4C EF: 46.4 %

## 2024-07-09 ENCOUNTER — Ambulatory Visit: Payer: Self-pay | Admitting: Internal Medicine

## 2024-07-10 ENCOUNTER — Encounter (INDEPENDENT_AMBULATORY_CARE_PROVIDER_SITE_OTHER): Payer: Self-pay

## 2024-07-10 ENCOUNTER — Encounter: Payer: Self-pay | Admitting: Internal Medicine

## 2024-07-11 ENCOUNTER — Ambulatory Visit

## 2024-07-11 ENCOUNTER — Telehealth: Payer: Self-pay | Admitting: *Deleted

## 2024-07-11 VITALS — Ht 73.0 in | Wt 200.0 lb

## 2024-07-11 DIAGNOSIS — Z0000 Encounter for general adult medical examination without abnormal findings: Secondary | ICD-10-CM

## 2024-07-11 NOTE — Progress Notes (Addendum)
 I connected with  Edward [name] Brought on 07/11/24 by a audio enabled telemedicine application and verified that I am speaking with the correct person using two identifiers.  Patient Location: Home  Provider Location: Office/Clinic  Persons Participating in Visit: Patient.  I discussed the limitations of evaluation and management by telemedicine. The patient expressed understanding and agreed to proceed.   Vital Signs: Because this visit was a virtual/telehealth visit, some criteria may be missing or patient reported. Any vitals not documented were not able to be obtained and vitals that have been documented are patient reported.   If you're able to add these things as option in the [employer], we won't need it ... but for those who refuse to use the template, I guess it does need to be updated    Chief Complaint  Patient presents with   Medicare Wellness    SUBSEQUENT     Subjective:   Edward [name] is a 73 y.o. male who presents for a Medicare Annual Wellness Visit.  Allergies (verified) Patient has no known allergies.   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 dx by echo 07/2020.   Dilation of aorta    Environmental allergies    Fracture, ribs    H/O: varicose veins    Hearing loss    Hyperlipidemia LDL goal <70    Multiple food allergies    Rash    Seasonal allergies    Sinusitis    Stroke [facility])    s/p tpa   Past Surgical History:  Procedure Laterality Date   BIOPSY OF SKIN SUBCUTANEOUS TISSUE AND/OR MUCOUS MEMBRANE  05/18/2024   Procedure: BIOPSY, SKIN, SUBCUTANEOUS TISSUE, OR MUCOUS MEMBRANE;  Surgeon: Stacia Glendia BRAVO, MD;  Location: [facility] ENDOSCOPY;  Service: Gastroenterology;;   COLONOSCOPY     ESOPHAGOGASTRODUODENOSCOPY N/A 05/18/2024   Procedure: EGD (ESOPHAGOGASTRODUODENOSCOPY);  Surgeon:  Stacia Glendia BRAVO, MD;  Location[facility] ENDOSCOPY;  Service: Gastroenterology;  Laterality: N/A;   INGUINAL HERNIA REPAIR Left    IR FLUORO RM 30-60 MIN  06/22/2024   IR RADIOLOGIST EVAL & MGMT  05/23/2024   right hand surgery     Family History  Problem Relation Age of Onset   CVA Mother    Heart failure Father        CHF   Brain cancer Brother        GBM   Colon polyps Neg Hx    Colon cancer Neg Hx    Esophageal cancer Neg Hx    Rectal cancer Neg Hx    Stomach cancer Neg Hx    Social History   Occupational History   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  Tobacco Use   Smoking status: Former   Smokeless tobacco: Never   Tobacco comments:    quit 40+ years ago  Vaping Use   Vaping status: Never Used  Substance and Sexual Activity   Alcohol use: Not Currently   Drug use: Never   Sexual activity: Yes    Partners: Female   Tobacco Counseling Counseling given: Not Answered Tobacco comments: quit 40+ years ago  SDOH Screenings   Food Insecurity: No Food Insecurity (07/11/2024)  Housing: Low Risk  (07/11/2024)  Transportation Needs: No Transportation Needs (07/11/2024)  Utilities: Not At Risk (07/11/2024)  Alcohol Screen: Low  Risk  (05/30/2024)  Depression (PHQ2-9): Low Risk  (07/11/2024)  Financial Resource Strain: Low Risk  (05/30/2024)  Physical Activity: Sufficiently Active (07/11/2024)  Social Connections: Socially Integrated (07/11/2024)  Stress: No Stress Concern Present (07/11/2024)  Tobacco Use: Medium Risk (07/11/2024)  Health Literacy: Adequate Health Literacy (07/11/2024)   See flowsheets for full screening details  Depression Screen PHQ 2 & 9 Depression Scale- Over the past 2 weeks, how often have you been bothered by any of the following problems? Little interest or pleasure in doing things: 0 Feeling down, depressed, or hopeless (PHQ Adolescent also includes...irritable): 0 PHQ-2 Total Score: 0 Trouble falling or staying asleep, or sleeping  too much: 0 (7-8 SLEEP AVG) Feeling tired or having little energy: 1 (DUE TO BREATHING) Poor appetite or overeating (PHQ Adolescent also includes...weight loss): 0 Feeling bad about yourself - or that you are a failure or have let yourself or your family down: 0 Trouble concentrating on things, such as reading the newspaper or watching television (PHQ Adolescent also includes...like school work): 0 Moving or speaking so slowly that other people could have noticed. Or the opposite - being so fidgety or restless that you have been moving around a lot more than usual: 0 Thoughts that you would be better off dead, or of hurting yourself in some way: 0 PHQ-9 Total Score: 1 If you checked off any problems, how difficult have these problems made it for you to do your work, take care of things at home, or get along with other people?: Not difficult at all  Depression Treatment Depression Interventions/Treatment : EYV7-0 Score <4 Follow-up Not Indicated     Goals Addressed   None    Visit info / Clinical Intake: Medicare Wellness Visit Type:: Subsequent Annual Wellness Visit Persons participating in visit:: patient Medicare Wellness Visit Mode:: Telephone If telephone:: video declined Because this visit was a virtual/telehealth visit:: pt reported vitals If Telephone or Video please confirm:: I connected with the patient using audio enabled telemedicine application and verified that I am speaking with the correct person using two identifiers; I discussed the limitations of evaluation and management by telemedicine; The patient expressed understanding and agreed to proceed Patient Location:: HOME Provider Location:: [facility] Information given by:: patient Interpreter Needed?: No Pre-visit prep was completed: yes AWV questionnaire completed by patient prior to visit?: no Living arrangements:: lives with spouse/significant other Patient's Overall Health Status Rating: (!)  fair Typical amount of pain: none Does pain affect daily life?: no Are you currently prescribed opioids?: no  Dietary Habits and Nutritional Risks How many meals a day?: 3 (STAYS HYDRATED) Eats fruit and vegetables daily?: yes Most meals are obtained by: preparing own meals; eating out (LUNCH: EATS OUT DURING WORK) In the last 2 weeks, have you had any of the following?: none Diabetic:: no  Functional Status Activities of Daily Living (to include ambulation/medication): Independent Ambulation: Independent Medication Administration: Independent Home Management: Independent Manage your own finances?: yes Primary transportation is: driving Concerns about vision?: no *vision screening is required for WTM* Concerns about hearing?: no  Fall Screening Falls in the past year?: 0 Number of falls in past year: 0 Was there an injury with Fall?: 0 Fall Risk Category Calculator: 0 Patient Fall Risk Level: Low Fall Risk  Fall Risk Patient at Risk for Falls Due to: No Fall Risks Fall risk Follow up: Falls evaluation completed; Education provided  Home and Transportation Safety: All rugs have non-skid backing?: (!) no All stairs or steps have  railings?: yes Grab bars in the bathtub or shower?: yes Have non-skid surface in bathtub or shower?: yes Good home lighting?: yes Regular seat belt use?: yes Hospital stays in the last year:: (!) yes How many hospital stays:: 2 Reason: PANCREATITIS (1ST TIME) AND DYSPNEA (2ND TIME)  Cognitive Assessment Difficulty concentrating, remembering, or making decisions? : no Will 6CIT or Mini Cog be Completed: no 6CIT or Mini Cog Declined: patient alert, oriented, able to answer questions appropriately and recall recent events  Advance Directives (For Healthcare) Does Patient Have a Medical Advance Directive?: No (DISCUSSING WITH FAMILY; PENDING) Would patient like information on creating a medical advance directive?: No - Patient  declined  Reviewed/Updated  Reviewed/Updated: Reviewed All (Medical, Surgical, Family, Medications, Allergies, Care Teams, Patient Goals)        Objective:    Today's Vitals   07/11/24 1424  Weight: 200 lb (90.7 kg)  Height: 6' 1 (1.854 m)  PainSc: 0-No pain   Body mass index is 26.39 kg/m.  Current Medications (verified) Outpatient Encounter Medications as of 07/11/2024  Medication Sig   acetaminophen  (TYLENOL ) 650 MG CR tablet Take 1,300 mg by mouth every 8 (eight) hours as needed for pain.   albuterol  (VENTOLIN  HFA) 108 (90 Base) MCG/ACT inhaler Inhale 2 puffs into the lungs every 4 (four) hours as needed for wheezing or shortness of breath.   aspirin  EC 81 MG tablet Take 81 mg by mouth at bedtime. Swallow whole.   budesonide-formoterol (SYMBICORT) 80-4.5 MCG/ACT inhaler Inhale 2 puffs into the lungs 2 (two) times daily as needed for up to 1 dose. (Patient taking differently: Inhale 2 puffs into the lungs at bedtime.)   cetirizine  (ZYRTEC  ALLERGY) 10 MG tablet Take 1 tablet (10 mg total) by mouth at bedtime.   Coenzyme Q10 (COQ-10) 100 MG CAPS Take 1 capsule by mouth in the morning and at bedtime.   finasteride  (PROSCAR ) 5 MG tablet TAKE 1 TABLET (5 MG TOTAL) BY MOUTH DAILY.   fluticasone  (FLONASE ) 50 MCG/ACT nasal spray Place 1 spray into both nostrils daily.   losartan (COZAAR) 25 MG tablet Take 1 tablet (25 mg total) by mouth daily.   metoprolol  succinate (TOPROL -XL) 25 MG 24 hr tablet TAKE 1 TABLET (25 MG) BY MOUTH DAILY. APPOINTMENT NEEDED FOR CONTINUATION OF REFILLS-1ST ATTEMPT   Multiple Vitamin (MULTIVITAMIN WITH MINERALS) TABS tablet Take 1 tablet by mouth daily.   N-ACETYL CYSTEINE 600 MG TABS Take 1 tablet by mouth at bedtime.   omeprazole  (PRILOSEC) 10 MG capsule Take 10 mg by mouth daily as needed.   OVER THE COUNTER MEDICATION Take 1 capsule by mouth at bedtime. PQQ: Pyrroloquinoline Quinone   RA CRANBERRY 500 MG CAPS Take 1 capsule by mouth in the morning and at  bedtime.   rosuvastatin  (CRESTOR ) 10 MG tablet TAKE 1 TABLET BY MOUTH EVERY DAY   spironolactone  (ALDACTONE ) 25 MG tablet Take 0.5 tablets (12.5 mg total) by mouth daily.   tamsulosin  (FLOMAX ) 0.4 MG CAPS capsule TAKE 1 CAPSULE BY MOUTH EVERY DAY   No facility-administered encounter medications on file as of 07/11/2024.   Hearing/Vision screen Hearing Screening - Comments:: Denies hearing difficulties.  Vision Screening - Comments:: Wears rx glasses - up to date with routine eye exams with [facility]  Immunizations and Health Maintenance Health Maintenance  Topic Date Due   DTaP/Tdap/Td (1 - Tdap) Never done   Zoster Vaccines- Shingrix (1 of 2) Never done   COVID-19 Vaccine (2 - Janssen risk series) 01/26/2020  Influenza Vaccine  11/27/2024 (Originally 03/30/2024)   Pneumococcal Vaccine: 50+ Years (2 of 2 - PPSV23, PCV20, or PCV21) 06/28/2025 (Originally 07/19/2019)   Medicare Annual Wellness (AWV)  07/11/2025   Colonoscopy  10/22/2027   Hepatitis C Screening  Completed   Meningococcal B Vaccine  Aged Out        Assessment/Plan:  This is a routine wellness examination for Posey.  Patient Care Team: Norrine Sharper, MD as PCP - General (Internal Medicine) Pearson, Vallie J, [facility] (Inactive) (Pharmacist) [facility], [place] as Consulting Physician [facility])  I have personally reviewed and noted the following in the patient's chart:   Medical and social history Use of alcohol, tobacco or illicit drugs  Current medications and supplements including opioid prescriptions. Functional ability and status Nutritional status Physical activity Advanced directives List of other physicians Hospitalizations, surgeries, and ER visits in previous 12 months Vitals Screenings to include cognitive, depression, and falls Referrals and appointments  No orders of the defined types were placed in this encounter.  In addition, I have reviewed and discussed with patient certain  preventive protocols, quality metrics, and best practice recommendations. A written personalized care plan for preventive services as well as general preventive health recommendations were provided to patient.   Roz LOISE Fuller, LPN   [id number]   Return in about 1 year (around 07/11/2025) for Medicare wellness.  After Visit Summary: (MyChart) Due to this being a telephonic visit, the after visit summary with patients personalized plan was offered to patient via MyChart   Nurse Notes: Patient aware of care gaps.

## 2024-07-11 NOTE — Telephone Encounter (Signed)
 Converted from fpl group:  I am having more trouble breathing today than usual so I need more help with this than I am getting. My next appointment with you is almost a month away and that is a long time for someone who is struggling to breathe. Please help! Thanks, Edward [name]  ------------------------------------------------------------------------------------------------------------------------------------------------  ATC x1.  LVM to return call so we can attempt to get him scheduled sooner.  Will await return call.

## 2024-07-11 NOTE — Patient Instructions (Signed)
 Edward [name],  Thank you for taking the time for your Medicare Wellness Visit. I appreciate your continued commitment to your health goals. Please review the care plan we discussed, and feel free to reach out if I can assist you further.  Please note that Annual Wellness Visits do not include a physical exam. Some assessments may be limited, especially if the visit was conducted virtually. If needed, we may recommend an in-person follow-up with your provider.  Ongoing Care Seeing your primary care provider every 3 to 6 months helps us  monitor your health and provide consistent, personalized care.   Referrals If a referral was made during today's visit and you haven't received any updates within two weeks, please contact the referred provider directly to check on the status.  Recommended Screenings:  Health Maintenance  Topic Date Due   DTaP/Tdap/Td vaccine (1 - Tdap) Never done   Zoster (Shingles) Vaccine (1 of 2) Never done   COVID-19 Vaccine (2 - Janssen risk series) 01/26/2020   Medicare Annual Wellness Visit  01/19/2024   Flu Shot  11/27/2024*   Pneumococcal Vaccine for age over 35 (2 of 2 - PPSV23, PCV20, or PCV21) 06/28/2025*   Colon Cancer Screening  10/22/2027   Hepatitis C Screening  Completed   Meningitis B Vaccine  Aged Out  *Topic was postponed. The date shown is not the original due date.       07/11/2024    2:28 PM  Advanced Directives  Does Patient Have a Medical Advance Directive? No  Would patient like information on creating a medical advance directive? No - Patient declined    Vision: Annual vision screenings are recommended for early detection of glaucoma, cataracts, and diabetic retinopathy. These exams can also reveal signs of chronic conditions such as diabetes and high blood pressure.  Dental: Annual dental screenings help detect early signs of oral cancer, gum disease, and other conditions linked to overall health, including heart disease and  diabetes.  Please see the attached documents for additional preventive care recommendations.

## 2024-07-12 ENCOUNTER — Encounter: Payer: Self-pay | Admitting: Pulmonary Disease

## 2024-07-12 ENCOUNTER — Ambulatory Visit: Admitting: Pulmonary Disease

## 2024-07-12 VITALS — BP 134/78 | HR 80 | Ht 73.0 in | Wt 208.0 lb

## 2024-07-12 DIAGNOSIS — R0602 Shortness of breath: Secondary | ICD-10-CM

## 2024-07-12 NOTE — Progress Notes (Signed)
 Edward [name]    [id number]    11-Jan-1951  Primary Care Physician:McLendon, Ozell, MD  Referring Physician: Norrine Ozell, MD [address] [place], [address] [place],  [place] [address]  Chief complaint:   Patient being seen for shortness of breath An acute visit  HPI:  Was recently seen by Dr. 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   albuterol  (VENTOLIN  HFA) 108 (90 Base) MCG/ACT inhaler Inhale 2 puffs into the lungs every 4 (four) hours as needed for wheezing or shortness of breath.   aspirin  EC 81 MG tablet Take 81 mg by mouth at bedtime. Swallow whole.   budesonide-formoterol (SYMBICORT) 80-4.5 MCG/ACT inhaler Inhale 2 puffs into the lungs 2 (two) times daily as needed for up to 1 dose. (Patient taking differently: Inhale 2 puffs into the lungs at bedtime.)   cetirizine  (ZYRTEC  ALLERGY) 10 MG tablet Take 1 tablet (10 mg total) by mouth at bedtime.   Coenzyme Q10 (COQ-10) 100 MG CAPS Take 1 capsule by mouth in the morning and at bedtime.    finasteride  (PROSCAR ) 5 MG tablet TAKE 1 TABLET (5 MG TOTAL) BY MOUTH DAILY.   fluticasone  (FLONASE ) 50 MCG/ACT nasal spray Place 1 spray into both nostrils daily.   losartan (COZAAR) 25 MG tablet Take 1 tablet (25 mg total) by mouth daily.   metoprolol  succinate (TOPROL -XL) 25 MG 24 hr tablet TAKE 1 TABLET (25 MG) BY MOUTH DAILY. APPOINTMENT NEEDED FOR CONTINUATION OF REFILLS-1ST ATTEMPT   Multiple Vitamin (MULTIVITAMIN WITH MINERALS) TABS tablet Take 1 tablet by mouth daily.   N-ACETYL CYSTEINE 600 MG TABS Take 1 tablet by mouth at bedtime.   omeprazole  (PRILOSEC) 10 MG capsule Take 10 mg by mouth daily as needed.   OVER THE COUNTER MEDICATION Take 1 capsule by mouth at bedtime. PQQ: Pyrroloquinoline Quinone   RA CRANBERRY 500 MG CAPS Take 1 capsule by mouth in the morning and at bedtime.   rosuvastatin  (CRESTOR ) 10 MG tablet TAKE 1 TABLET BY MOUTH EVERY DAY   spironolactone  (ALDACTONE ) 25 MG tablet Take 0.5 tablets (12.5 mg total) by mouth daily.   tamsulosin  (FLOMAX ) 0.4 MG CAPS capsule TAKE 1 CAPSULE BY MOUTH EVERY DAY   No facility-administered encounter medications on file as of 07/12/2024.    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 dx by echo 07/2020.   Dilation of aorta    Environmental allergies    Fracture, ribs    H/O: varicose veins    Hearing loss    Hyperlipidemia LDL goal <70    Multiple food allergies    Rash    Seasonal allergies    Sinusitis    Stroke [facility])    s/p tpa    Past Surgical History:  Procedure Laterality Date   BIOPSY OF SKIN SUBCUTANEOUS TISSUE AND/OR MUCOUS MEMBRANE  05/18/2024   Procedure: BIOPSY, SKIN, SUBCUTANEOUS TISSUE, OR MUCOUS MEMBRANE;  Surgeon: Stacia Glendia BRAVO, MD;  Location: [facility] ENDOSCOPY;  Service: Gastroenterology;;   COLONOSCOPY      ESOPHAGOGASTRODUODENOSCOPY N/A 05/18/2024   Procedure: EGD (ESOPHAGOGASTRODUODENOSCOPY);  Surgeon: Stacia Glendia BRAVO, MD;  Location: [facility] ENDOSCOPY;  Service: Gastroenterology;  Laterality: N/A;   INGUINAL HERNIA REPAIR Left    IR FLUORO RM 30-60 MIN  06/22/2024   IR RADIOLOGIST EVAL & MGMT  05/23/2024   right hand surgery      Family History  Problem Relation Age of Onset   CVA Mother    Heart failure Father        CHF   Brain cancer Brother        GBM   Colon polyps Neg Hx    Colon cancer Neg Hx    Esophageal cancer Neg Hx    Rectal cancer Neg Hx    Stomach cancer Neg Hx     Social History   Socioeconomic History   Marital status: Married    Spouse name: Dorothyann   Number of children: 2   Years of education: Not on file   Highest education level: Bachelor's degree (e.g., BA, AB, BS)  Occupational History   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  Tobacco Use   Smoking status: Former   Smokeless tobacco: Never   Tobacco comments:    quit 40+ years ago  Vaping Use   Vaping status: Never Used  Substance and Sexual Activity   Alcohol use: Not Currently   Drug use: Never   Sexual activity: Yes    Partners: Female  Other Topics Concern   Not on file  Social History Narrative   Not on file   Social Drivers of Health   Financial Resource Strain: Low Risk  (05/30/2024)   Overall Financial Resource Strain (CARDIA)    Difficulty of Paying Living Expenses: Not very hard  Food Insecurity: No Food Insecurity (07/11/2024)   Hunger Vital Sign    Worried About Running Out of Food in the Last Year: Never true    Ran Out of Food in the Last Year: Never true  Transportation Needs: No Transportation Needs (07/11/2024)   PRAPARE - Administrator, Civil Service (Medical): No    Lack of Transportation (Non-Medical): No  Physical Activity: Sufficiently Active (07/11/2024)   Exercise Vital Sign    Days of Exercise per Week: 7 days    Minutes of Exercise per  Session: 30 min  Stress: No Stress Concern Present (07/11/2024)   [employer] of Occupational Health - Occupational Stress Questionnaire    Feeling of Stress: Not at all  Social Connections: Socially Integrated (07/11/2024)   Social Connection and Isolation Panel    Frequency of Communication with Friends and Family: More than three times a week    Frequency of Social Gatherings with Friends and Family: More than three times a week  Attends Religious Services: More than 4 times per year    Active Member of Clubs or Organizations: Yes    Attends Banker Meetings: More than 4 times per year    Marital Status: Married  Catering Manager Violence: Not At Risk (07/11/2024)   Humiliation, Afraid, Rape, and Kick questionnaire    Fear of Current or Ex-Partner: No    Emotionally Abused: No    Physically Abused: No    Sexually Abused: No    Review of Systems  Respiratory:  Positive for shortness of breath.     Vitals:   07/12/24 1545  BP: 134/78  Pulse: 80  SpO2: 95%     Physical Exam HENT:     Head: Normocephalic and atraumatic.     Mouth/Throat:     Mouth: Mucous membranes are moist.  Eyes:     General: No scleral icterus.    Pupils: Pupils are equal, round, and reactive to light.  Cardiovascular:     Rate and Rhythm: Normal rate and regular rhythm.     Heart sounds: No murmur heard.    No friction rub.  Pulmonary:     Effort: No respiratory distress.     Breath sounds: No stridor. No wheezing or rhonchi.  Musculoskeletal:     Cervical back: Normal range of motion. No rigidity or tenderness.  Neurological:     Mental Status: He is alert.  Psychiatric:        Mood and Affect: Mood normal.    Data Reviewed: Recent sniff test negative CT scan of the chest 06/03/2024 reviewed  CT scan of the chest 05/27/2024 reviewed  CT chest abdomen and pelvis 05/16/2024 reviewed  CT shows progressive atelectasis at the bases bilaterally worse on the left  Most  recent echocardiogram 07/05/2024 shows ejection fraction 45 to 50% grade 1 diastolic dysfunction normal pulmonary artery pressure  Assessment/Plan: Shortness of breath  Atelectasis  Orthopnea  Diastolic heart failure  Will obtain a full pulmonary function test with inspiratory and expiratory pressures  Discussed about the use of inspiratory muscle trainers  Does not have any symptoms to suggest obstructive airway disease, no emphysema on CT  -May use albuterol  as needed  Follow-up in about 4 weeks  Reasons for his progressively worsening symptoms is unclear Heart failure seems to be optimized at present  Encouraged to call with other significant concerns  Jennet Epley MD [facility] 07/12/2024, 3:57 PM  CC: Norrine Sharper, MD

## 2024-07-12 NOTE — Patient Instructions (Addendum)
 Will see you back in about 4 weeks  Schedule for full pulmonary function test with inspiratory and expiratory pressures  Respironics inspiratory muscle trainer-place order to DME  Power lung is in the device we looked at- it trains to inspiratory muscles and expiratory muscles  If the Respironics inspiratory muscle trainer is not covered by your medical insurance you can also procure that online-search for Respironics IMT

## 2024-07-13 NOTE — Telephone Encounter (Signed)
 I called and spoke to pt. Pt states he saw Dr Neda yesterday and is doing fine from the appt. Pt states he was rx Symbicort while in the hospital and states he did not know how to contact to have it refilled when he needed it. I informed pt that he could call our office to have this refilled and to call our office when he has a weeks' worth to make sure he doesn't run out. I advised pt that we could ask Dr Neda or Dr Geronimo if they would refill it as this came from a hospital. Pt verbalized understanding. NFN

## 2024-07-16 NOTE — Telephone Encounter (Signed)
 Dr. Geronimo, Patient is requesting a referral for a new Internal medicine physician.  Please advise.  Thank you.

## 2024-07-18 NOTE — Progress Notes (Signed)
 Internal Medicine Attending:  I reviewed the AWV findings of the medical professional who conducted the visit. I was present in the office suite and immediately available to provide assistance and direction throughout the time the service was provided.

## 2024-07-19 NOTE — Telephone Encounter (Signed)
  Ok to refer to [facility] or [facility]    [address] [place] [address]

## 2024-07-23 ENCOUNTER — Other Ambulatory Visit: Payer: Self-pay

## 2024-08-01 ENCOUNTER — Encounter (INDEPENDENT_AMBULATORY_CARE_PROVIDER_SITE_OTHER): Payer: Self-pay

## 2024-08-01 ENCOUNTER — Ambulatory Visit (INDEPENDENT_AMBULATORY_CARE_PROVIDER_SITE_OTHER)

## 2024-08-01 VITALS — BP 115/76 | HR 73 | Ht 73.0 in | Wt 200.0 lb

## 2024-08-01 DIAGNOSIS — R0602 Shortness of breath: Secondary | ICD-10-CM

## 2024-08-01 DIAGNOSIS — R251 Tremor, unspecified: Secondary | ICD-10-CM | POA: Diagnosis not present

## 2024-08-02 NOTE — Progress Notes (Signed)
 Dear Dr. Geronimo, Here is my assessment for our mutual patient, Edward [name]. Thank you for allowing me the opportunity to care for your patient. Please do not hesitate to contact me should you have any other questions. Sincerely, Dr. Hadassah Parody  Otolaryngology Clinic Note Referring provider: Dr. Geronimo HPI:   Initial HPI (08/01/2024) Discussed the use of AI scribe software for clinical note transcription with the patient, who gave verbal consent to proceed.  History of Present Illness Edward [name] is a 73 year old male who presents with shortness of breath.  Dyspnea and respiratory symptoms - Shortness of breath present since early September, stable without progression or improvement - Symptoms more pronounced during fall allergy season - Associated sensation of chest tightness - No noisy breathing or stridor noted  - No significant cough or sputum production - Less postnasal drainage this year compared to previous years - Difficulty breathing when lying supine, limiting ability to undergo imaging studies - Difficulty maintaining exercise routine and decreased physical activity - No recent surgeries or use of a breathing tube  Pulmonary findings - Told the bases of his lungs appeared 'shriveled up' on imaging (atelectasis) suspected to be related to inactivity  Neurological symptoms - Small tremor present, stable without recent worsening - No recent family concerns regarding tremor  Recent hospitalization and gastrointestinal findings - Hospitalized for pancreatitis for 2-3 days, etiology undetermined - Incidental finding of aneurysm on a blood vessel supplying the stomach, requires treatment but unable to proceed due to dyspnea when supine    Independent Review of Additional Tests or Records:  Note from Dorethia Geronimo, MD (06/28/24)  - Recent sniff test negative. CT shows progressive atelectasis at the bases bilaterally worse on the left. Most recent echocardiogram  07/05/2024 shows ejection fraction 45 to 50% grade 1 diastolic dysfunction normal pulmonary artery pressure. Reasons for his progressively worsening symptoms is unclear. Heart failure seems to be optimized at present   Note from Harden Staff, MD 06/28/24: SOB when lying flat in office. SOB since early September.   CT chest 06/03/24 reviewed and shows trachea without narrowing   PMH/Meds/All/SocHx/FamHx/ROS:   Past Medical History:  Diagnosis Date   Abnormal EKG 05/29/2018   Sinus bradycardia and LAFB with TWI V4-V6, III and aVF   Benign essential HTN 05/29/2018   Cardiomyopathy (HCC)    a. dx by echo 07/2020.   Dilation of aorta    Environmental allergies    Fracture, ribs    H/O: varicose veins    Hearing loss    Hyperlipidemia LDL goal <70    Multiple food allergies    Rash    Seasonal allergies    Sinusitis    Stroke [facility])    s/p tpa     Past Surgical History:  Procedure Laterality Date   BIOPSY OF SKIN SUBCUTANEOUS TISSUE AND/OR MUCOUS MEMBRANE  05/18/2024   Procedure: BIOPSY, SKIN, SUBCUTANEOUS TISSUE, OR MUCOUS MEMBRANE;  Surgeon: Stacia Glendia BRAVO, MD;  Location: [facility] ENDOSCOPY;  Service: Gastroenterology;;   COLONOSCOPY     ESOPHAGOGASTRODUODENOSCOPY N/A 05/18/2024   Procedure: EGD (ESOPHAGOGASTRODUODENOSCOPY);  Surgeon: Stacia Glendia BRAVO, MD;  Location: [facility] ENDOSCOPY;  Service: Gastroenterology;  Laterality: N/A;   INGUINAL HERNIA REPAIR Left    IR FLUORO RM 30-60 MIN  06/22/2024   IR RADIOLOGIST EVAL & MGMT  05/23/2024   right hand surgery      Family History  Problem Relation Age of Onset   CVA Mother    Heart failure Father  CHF   Brain cancer Brother        GBM   Colon polyps Neg Hx    Colon cancer Neg Hx    Esophageal cancer Neg Hx    Rectal cancer Neg Hx    Stomach cancer Neg Hx      Social Connections: Socially Integrated (07/11/2024)   Social Connection and Isolation Panel    Frequency of Communication with Friends and Family: More than three  times a week    Frequency of Social Gatherings with Friends and Family: More than three times a week    Attends Religious Services: More than 4 times per year    Active Member of [employer] or Organizations: Yes    Attends Engineer, Structural: More than 4 times per year    Marital Status: Married     Current Outpatient Medications  Medication Instructions   acetaminophen  (TYLENOL ) 1,300 mg, Every 8 hours PRN   albuterol  (VENTOLIN  HFA) 108 (90 Base) MCG/ACT inhaler 2 puffs, Inhalation, Every 4 hours PRN   aspirin  EC 81 mg, Daily at bedtime   budesonide -formoterol  (SYMBICORT ) 80-4.5 MCG/ACT inhaler 2 puffs, Inhalation, 2 times daily PRN   cetirizine  (ZYRTEC  ALLERGY) 10 mg, Oral, Daily at bedtime   Coenzyme Q10 (COQ-10) 100 MG CAPS 1 capsule, 2 times daily   finasteride  (PROSCAR ) 5 mg, Oral, Daily   fluticasone  (FLONASE ) 50 MCG/ACT nasal spray 1 spray, Each Nare, Daily   losartan  (COZAAR ) 25 mg, Oral, Daily   metoprolol  succinate (TOPROL -XL) 25 MG 24 hr tablet TAKE 1 TABLET (25 MG) BY MOUTH DAILY. APPOINTMENT NEEDED FOR CONTINUATION OF REFILLS-1ST ATTEMPT   Multiple Vitamin (MULTIVITAMIN WITH MINERALS) TABS tablet 1 tablet, Daily   N-ACETYL CYSTEINE 600 MG TABS 1 tablet, Daily at bedtime   omeprazole  (PRILOSEC) 10 mg, Daily PRN   OVER THE COUNTER MEDICATION 1 capsule, Daily at bedtime   RA CRANBERRY 500 MG CAPS 1 capsule, 2 times daily   rosuvastatin  (CRESTOR ) 10 mg, Oral, Daily   spironolactone  (ALDACTONE ) 12.5 mg, Oral, Daily   tamsulosin  (FLOMAX ) 0.4 mg, Oral, Daily     Physical Exam:   BP 115/76 (BP Location: Left Arm, Patient Position: Sitting)   Pulse 73   Ht 6' 1 (1.854 m)   Wt 200 lb (90.7 kg)   SpO2 94%   BMI 26.39 kg/m   Salient findings:  CN II-XII intact Bilateral EAC clear and TM intact with well pneumatized middle ear spaces No lesions of oral cavity/oropharynx No obviously palpable neck masses/lymphadenopathy/thyromegaly No respiratory distress or  stridor TFL was indicated to better evaluate the proximal airway, given the patient's history and exam findings, and is detailed below.  Seprately Identifiable Procedures:  Prior to initiating any procedures, risks/benefits/alternatives were explained to the patient and verbal consent obtained.  Procedure Note (08/01/2024) Pre-procedure diagnosis:  Dyspnea  Post-procedure diagnosis: Same Procedure: Transnasal Fiberoptic Laryngoscopy, CPT 31575 - Mod 25 Indication: dyspnea  Complications: None apparent EBL: 0 mL  The procedure was undertaken to further evaluate the patient's complaint of dyspnea, with mirror exam inadequate for appropriate examination due to gag reflex and poor patient tolerance  Procedure:  Patient was identified as correct patient. Verbal consent was obtained. The nose was sprayed with oxymetazoline and 4% lidocaine . The The flexible laryngoscope was passed through the nose to view the nasal cavity, pharynx (oropharynx, hypopharynx) and larynx.  The larynx was examined at rest and during multiple phonatory tasks. Documentation was obtained and reviewed with patient. The scope was removed.  The patient tolerated the procedure well.  Findings: The nasal cavity and nasopharynx did not reveal any masses or lesions, mucosa appeared to be without obvious lesions. The tongue base, pharyngeal walls, piriform sinuses, vallecula, epiglottis and postcricoid region are normal in appearance. The visualized portion of the subglottis and proximal trachea is widely patent. The vocal folds are mobile bilaterally. There are no lesions on the free edge of the vocal folds nor elsewhere in the larynx worrisome for malignancy.  Proximal airway is clear - no evidence of subglottic or tracheal stenosis   Electronically signed by: Hadassah JAYSON Parody, MD 08/02/2024 4:51 PM   Impression & Plans:  Waldon Sheerin is a 73 y.o. male with   1. Shortness of breath     Assessment and Plan Assessment &  Plan Shortness of breath Chronic shortness of breath since September, exacerbated by fall allergies. No improvement or worsening. No upper airway obstruction or masses. Vocal cords move normally. No tracheal scarring or narrowing. He does have a small tremor (? Neurologic issue?) but this has not worsened.  Per cardiology notes heart failure is controlled.  - No upper airway cause for shortness of breath, proximal trachea is patent on endoscopy and chest CT shows patent trachea on imaging as well. No concern for stenosis at this time. Vocal folds moving appropriately  - provided reassurance from ENT standpoint.  - continue follow-up with cardiology and pulm for further workup    See below regarding exact medications prescribed this encounter including dosages and route: No orders of the defined types were placed in this encounter.     Thank you for allowing me the opportunity to care for your patient. Please do not hesitate to contact me should you have any other questions.  Sincerely, Hadassah Parody, MD Otolaryngologist (ENT), [facility] Phone: [phone] Fax: [phone]  MDM:  Level 4 Complexity/Problems addressed: acute illness causing systemic symptoms  Data complexity: 4 independent review of 2 notes, CT - Morbidity: low  - Prescription Drug prescribed or managed: no

## 2024-08-07 ENCOUNTER — Ambulatory Visit: Admitting: Primary Care

## 2024-08-09 ENCOUNTER — Telehealth (HOSPITAL_COMMUNITY): Payer: Self-pay

## 2024-08-09 ENCOUNTER — Encounter (HOSPITAL_COMMUNITY): Payer: Self-pay

## 2024-08-09 ENCOUNTER — Ambulatory Visit (HOSPITAL_COMMUNITY): Admission: RE | Admit: 2024-08-09 | Discharge: 2024-08-09 | Attending: Physician Assistant

## 2024-08-09 VITALS — BP 116/83 | HR 65 | Ht 73.0 in | Wt 209.8 lb

## 2024-08-09 DIAGNOSIS — E785 Hyperlipidemia, unspecified: Secondary | ICD-10-CM | POA: Diagnosis not present

## 2024-08-09 DIAGNOSIS — R0602 Shortness of breath: Secondary | ICD-10-CM | POA: Diagnosis not present

## 2024-08-09 DIAGNOSIS — N400 Enlarged prostate without lower urinary tract symptoms: Secondary | ICD-10-CM | POA: Diagnosis not present

## 2024-08-09 DIAGNOSIS — I7121 Aneurysm of the ascending aorta, without rupture: Secondary | ICD-10-CM | POA: Diagnosis not present

## 2024-08-09 DIAGNOSIS — Z7982 Long term (current) use of aspirin: Secondary | ICD-10-CM | POA: Diagnosis not present

## 2024-08-09 DIAGNOSIS — R001 Bradycardia, unspecified: Secondary | ICD-10-CM | POA: Diagnosis not present

## 2024-08-09 DIAGNOSIS — I8393 Asymptomatic varicose veins of bilateral lower extremities: Secondary | ICD-10-CM | POA: Diagnosis not present

## 2024-08-09 DIAGNOSIS — Z8673 Personal history of transient ischemic attack (TIA), and cerebral infarction without residual deficits: Secondary | ICD-10-CM | POA: Diagnosis not present

## 2024-08-09 DIAGNOSIS — I5022 Chronic systolic (congestive) heart failure: Secondary | ICD-10-CM

## 2024-08-09 DIAGNOSIS — I10 Essential (primary) hypertension: Secondary | ICD-10-CM

## 2024-08-09 DIAGNOSIS — I110 Hypertensive heart disease with heart failure: Secondary | ICD-10-CM | POA: Diagnosis not present

## 2024-08-09 DIAGNOSIS — Z79899 Other long term (current) drug therapy: Secondary | ICD-10-CM | POA: Diagnosis not present

## 2024-08-09 LAB — CBC
HCT: 39.7 % (ref 39.0–52.0)
Hemoglobin: 13.3 g/dL (ref 13.0–17.0)
MCH: 29 pg (ref 26.0–34.0)
MCHC: 33.5 g/dL (ref 30.0–36.0)
MCV: 86.5 fL (ref 80.0–100.0)
Platelets: 317 K/uL (ref 150–400)
RBC: 4.59 MIL/uL (ref 4.22–5.81)
RDW: 12.6 % (ref 11.5–15.5)
WBC: 7 K/uL (ref 4.0–10.5)
nRBC: 0 % (ref 0.0–0.2)

## 2024-08-09 LAB — BASIC METABOLIC PANEL WITH GFR
Anion gap: 9 (ref 5–15)
BUN: 14 mg/dL (ref 8–23)
CO2: 30 mmol/L (ref 22–32)
Calcium: 8.7 mg/dL — ABNORMAL LOW (ref 8.9–10.3)
Chloride: 94 mmol/L — ABNORMAL LOW (ref 98–111)
Creatinine, Ser: 0.98 mg/dL (ref 0.61–1.24)
GFR, Estimated: >60 mL/min (ref >60)
Glucose, Bld: 104 mg/dL — ABNORMAL HIGH (ref 70–99)
Potassium: 4.1 mmol/L (ref 3.5–5.1)
Sodium: 133 mmol/L — ABNORMAL LOW (ref 135–145)

## 2024-08-09 LAB — BRAIN NATRIURETIC PEPTIDE: B Natriuretic Peptide: 56.3 pg/mL (ref 0.0–100.0)

## 2024-08-09 MED ORDER — SPIRONOLACTONE 25 MG PO TABS: 25.0000 mg | ORAL_TABLET | Freq: Every day | ORAL | 6 refills | Status: DC

## 2024-08-09 NOTE — Telephone Encounter (Signed)
 Called to confirm/remind patient of their appointment at the [facility] on 08/09/24.   Appointment:   [x] Confirmed  [] Left mess   [] No answer/No voice mail  [] VM Full/unable to leave message  [] Phone not in service  Patient reminded to bring all medications and/or complete list.  Confirmed patient has transportation. Gave directions, instructed to utilize valet parking.

## 2024-08-09 NOTE — Progress Notes (Signed)
 ReDS Vest / Clip - 08/09/24 1526       ReDS Vest / Clip   Station Marker D    Ruler Value 32    ReDS Value Range Low volume    ReDS Actual Value 30

## 2024-08-09 NOTE — H&P (View-Only) (Signed)
 HEART & VASCULAR TRANSITION OF CARE CLINIC NOTE     Referring Physician: Norrine Sharper, MD   Chief Complaint: HFmrEF  HPI: Referred to clinic by Dr. Francesco with internal medicine teaching service for heart failure consultation.   Edward [name] is a 73 y.o. male with history of HFmrEF, bradycardia, varicose veins, ascending aortic aneurysm, HTN, HLD, CVA s/p TPA in 2016, BPH, coronary CTA 2022 with calcium  score of 0 and no CAD.   Echo 2016: EF 30-35%. HF diagnosed at time of CVA and given tPA in [place], [place].  Echo 2019: EF 50-55%  Echo 12/21: EF 35%  Echo 04/23: EF 35-40%, RV okay  Echo 01/25: EF 45-50%, RV okay  He's had multiple recent admissions. Admitted 9/8 w/ SIADH w/ severe hyponatremia, possible acute pancreatitis (normal lipase but CT imaging suggested) and E.Coli UTI. For management of SIADH, meloxicam  and spironolactone  were stopped. He was started on salt tablets. He was discharged 9/13 then readmitted 9/17 w/ GI bleeding and acute anemia. EGD with evidence of gastritis and gastric polyps which were biopsied. GI f/u arranged and started on protonix . He was then readmitted 9/28 with worsening shortness of breath and respiratory failure felt to be 2/2 combination of CHF but suspected primary pulmonary etiology.  Improved with IV diuresis and bronchodilators. Referred to [facility] and pulmonology.   Seen in [facility] clinic 06/14/24. Continued with significant dyspnea. Volume appeared okay. Repeat echo with EF 45-50%, grade I DD, RV function okay, RV mildly enlarged. Only noted improvement with recent addition of Symbicort . He was seen by Pulmonary. There was some concern for diaphragmatic paralysis but sniff test negative. PFTs scheduled. CT had been ordered but not completed d/t severe orthopnea.  Here today for TOC follow-up. Continues with significant exertional dyspnea and orthopnea. Gets winded during conversation in clinic. Can walk on a flat surface for a couple hundred  feet but gets wiped out if he attempts to talk or do anything more strenuous. No lower extremity edema. Weight has been stable. Taking all medications.   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   No tobacco use, denies heavy ETOH.  Father may have had CHF (hx unclear), mother had TIAs/strokes. No siblings with CHF.   Past Medical History:  Diagnosis Date   Abnormal EKG 05/29/2018   Sinus bradycardia and LAFB with TWI V4-V6, III and aVF   Benign essential HTN 05/29/2018   Cardiomyopathy (HCC)    a. dx by echo 07/2020.   Dilation of aorta    Environmental allergies    Fracture, ribs    H/O: varicose veins    Hearing loss    Hyperlipidemia LDL goal <70    Multiple food allergies    Rash    Seasonal allergies    Sinusitis    Stroke [facility])    s/p tpa    Current Outpatient Medications  Medication Sig Dispense Refill   acetaminophen  (TYLENOL ) 650 MG CR tablet Take 1,300 mg by mouth every 8 (eight) hours as needed for pain.     albuterol  (VENTOLIN  HFA) 108 (90 Base) MCG/ACT inhaler Inhale 2 puffs into the lungs every 4 (four) hours as needed for wheezing or shortness of breath.     aspirin  EC 81 MG tablet Take 81 mg by mouth at bedtime. Swallow whole.     cetirizine  (ZYRTEC  ALLERGY)  10 MG tablet Take 1 tablet (10 mg total) by mouth at bedtime. 15 tablet 0   Coenzyme Q10 (COQ-10) 100 MG CAPS Take 1 capsule by mouth in the morning and at bedtime.     finasteride  (PROSCAR ) 5 MG tablet TAKE 1 TABLET (5 MG TOTAL) BY MOUTH DAILY. 90 tablet 1   losartan  (COZAAR ) 25 MG tablet Take 1 tablet (25 mg total) by mouth daily. 90 tablet 0   metoprolol  succinate (TOPROL -XL) 25 MG 24 hr tablet TAKE 1 TABLET (25 MG) BY MOUTH DAILY. APPOINTMENT NEEDED FOR CONTINUATION OF REFILLS-1ST ATTEMPT 90 tablet 3   Multiple Vitamin (MULTIVITAMIN WITH MINERALS) TABS  tablet Take 1 tablet by mouth daily.     N-ACETYL CYSTEINE 600 MG TABS Take 1 tablet by mouth at bedtime.     omeprazole  (PRILOSEC) 10 MG capsule Take 10 mg by mouth daily as needed.     OVER THE COUNTER MEDICATION Take 1 capsule by mouth at bedtime. PQQ: Pyrroloquinoline Quinone     rosuvastatin  (CRESTOR ) 10 MG tablet TAKE 1 TABLET BY MOUTH EVERY DAY 90 tablet 1   tamsulosin  (FLOMAX ) 0.4 MG CAPS capsule TAKE 1 CAPSULE BY MOUTH EVERY DAY 90 capsule 1   budesonide -formoterol  (SYMBICORT ) 80-4.5 MCG/ACT inhaler Inhale 2 puffs into the lungs 2 (two) times daily as needed for up to 1 dose. (Patient not taking: Reported on 08/09/2024) 10.2 g 0   fluticasone  (FLONASE ) 50 MCG/ACT nasal spray Place 1 spray into both nostrils daily. (Patient not taking: Reported on 08/09/2024) 16 g 0   RA CRANBERRY 500 MG CAPS Take 1 capsule by mouth in the morning and at bedtime.     spironolactone  (ALDACTONE ) 25 MG tablet Take 1 tablet (25 mg total) by mouth daily. 30 tablet 6   No current facility-administered medications for this encounter.    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  Tobacco Use   Smoking status: Former   Smokeless tobacco: Never   Tobacco comments:    quit 40+ years ago  Vaping Use   Vaping status: Never Used  Substance and Sexual Activity   Alcohol use: Not Currently   Drug use: Never   Sexual activity: Yes    Partners: Female  Other Topics Concern   Not on file  Social History Narrative   Not on file   Social Drivers of Health   Tobacco Use: Medium Risk (08/09/2024)   Patient History    Smoking Tobacco Use: Former    Smokeless Tobacco Use: Never    Passive Exposure: Not on Actuary Strain:  Low Risk (05/30/2024)   Overall Financial Resource Strain (CARDIA)    Difficulty of Paying Living Expenses: Not very hard  Food Insecurity: No Food Insecurity (07/11/2024)   Epic    Worried About Radiation Protection Practitioner of Food in the Last Year: Never true    Ran Out of Food in the Last Year: Never true  Transportation Needs: No Transportation Needs (07/11/2024)   Epic    Lack of Transportation (Medical): No    Lack of Transportation (Non-Medical): No  Physical Activity: Sufficiently Active (07/11/2024)   Exercise Vital Sign    Days of Exercise per  Week: 7 days    Minutes of Exercise per Session: 30 min  Stress: No Stress Concern Present (07/11/2024)   [employer] of Occupational Health - Occupational Stress Questionnaire    Feeling of Stress: Not at all  Social Connections: Socially Integrated (07/11/2024)   Social Connection and Isolation Panel    Frequency of Communication with Friends and Family: More than three times a week    Frequency of Social Gatherings with Friends and Family: More than three times a week    Attends Religious Services: More than 4 times per year    Active Member of Clubs or Organizations: Yes    Attends Banker Meetings: More than 4 times per year    Marital Status: Married  Catering Manager Violence: Not At Risk (07/11/2024)   Epic    Fear of Current or Ex-Partner: No    Emotionally Abused: No    Physically Abused: No    Sexually Abused: No  Depression (PHQ2-9): Low Risk (07/11/2024)   Depression (PHQ2-9)    PHQ-2 Score: 1  Alcohol Screen: Low Risk (05/30/2024)   Alcohol Screen    Last Alcohol Screening Score (AUDIT): 0  Housing: Low Risk (07/11/2024)   Epic    Unable to Pay for Housing in the Last Year: No    Number of Times Moved in the Last Year: 0    Homeless in the Last Year: No  Utilities: Not At Risk (07/11/2024)   Epic    Threatened with loss of utilities: No  Health Literacy: Adequate Health Literacy (07/11/2024)   B1300 Health  Literacy    Frequency of need for help with medical instructions: Never      Family History  Problem Relation Age of Onset   CVA Mother    Heart failure Father        CHF   Brain cancer Brother        GBM   Colon polyps Neg Hx    Colon cancer Neg Hx    Esophageal cancer Neg Hx    Rectal cancer Neg Hx    Stomach cancer Neg Hx     Vitals:   08/09/24 1526  BP: 116/83  Pulse: 65  SpO2: 96%  Weight: 95.2 kg (209 lb 12.8 oz)  Height: 6' 1 (1.854 m)     PHYSICAL EXAM: General: Ambulated into clinic. Neck: no JVD Cor: Regular rate & rhythm. No murmurs. Lungs: mildly dyspneic with conversation Abdomen: soft, nontender, nondistended. Extremities: no edema Neuro: alert & orientedx3. Affect pleasant  ReDS 30%, normal   ASSESSMENT & PLAN: HFmrEF -EF as low as 35% in 2021 -Coronary CTA in 2/22: calcium  score 0, no significant CAD -Echo 04/23: EF 35-40%,  -Echo 01/25: EF 45-50%, RV mildly enlarged with okay function -Echo 11/25: EF 45-50%, grade I DD, RV mildly enlarged with okay function -Etiology not certain. May need to consider cMRI. -NYHA III, not entirely clear at this time if limitation is from HF or pulmonary source. Volume okay on exam and by ReDS which is 30%. 28% last visit. Not currently on loop diuretic. -Increase spiro to 25 mg daily -Unable to afford entresto , continue losartan  25 mg daily -Continue toprol  xl 25 mg nightly, denies wheezing -Have held off on SGLT2i with UTI 9/25 (ESBL E. Coli carrier) -See discussion below regarding dyspnea. Planning RHC.  2. Shortness of breath -Etiology not clear -Not volume overloaded on exam. LV okay but RV mildly enlarged on echo 11/25. ReDS 30% today and 28%  last visit.  -O2 sats 92%>>89% ambulating in the clinic.  -Undergoing workup with pulmonary. PFTs previously ordered. Unable to complete HiRes CT d/t orthopnea. He's had modest improvement with Symbicort . No PE on CTA chest in 10/25. -Discussed symptoms and  workup thus far with Dr. Cherrie. Will arrange for RHC to definitively assess volume status and PA pressures.   3. HTN -BP well-controlled.  -Meds as above.  4. Ascending aortic aneurysm -Aortic root measured 4.5 cm and ascending aorta 4 cm on echo 1/25 -Ascending aorta 4.1 cm on CT PE study 10/25 -Follow by primary Cardiology team  5. Hx CVA - S/p TPA - On aspirin  + statin  Referred to HFSW (PCP, Medications, Transportation, ETOH Abuse, Drug Abuse, Insurance, Surveyor, Quantity ): No Refer to Pharmacy: No Refer to Home Health: No Refer to [facility] Clinic: yes, 1 visit pending workup Refer to General Cardiology: No, already established  Follow up  4 weeks with APP after RHC. May eventually be able to graduate from AHF clinic and follow-up with general cardiology pending further workup.   Manuelita Dutch, PA-C

## 2024-08-09 NOTE — Patient Instructions (Signed)
 Medication Changes:  INCREASE Spironolactone  to 25 mg (1 tab) Daily  Lab Work:  Labs done today, your results will be available in MyChart, we will contact you for abnormal readings.  Testing/Procedures:  Heart Catheterization on tue 12/16, see instructions below    Special Instructions // Education:  Do the following things EVERYDAY: Weigh yourself in the morning before breakfast. Write it down and keep it in a log. Take your medicines as prescribed Eat low salt foods--Limit salt (sodium) to 2000 mg per day.  Stay as active as you can everyday Limit all fluids for the day to less than 2 liters    CATHETERIZATION INSTRUCTIONS:  You are scheduled for a Cardiac Catheterization on Tuesday, December 16 with Dr. Toribio Fuel.  1. Please arrive at the [facility] (Main Entrance A) at [facility]: [address] [place], [place] [address] at 11:30 AM (This time is 2 hour(s) before your procedure to ensure your preparation).   Free valet parking service is available. You will check in at ADMITTING. The support person will be asked to wait in the waiting room.  It is OK to have someone drop you off and come back when you are ready to be discharged.    Special note: Every effort is made to have your procedure done on time. Please understand that emergencies sometimes delay scheduled procedures.  2. Diet: Nothing to eat after midnight.   3. Hydration: On December 16, you may drink approved liquids (see below) until 2 hours before the procedure with 8 oz of water as your last intake.   List of approved liquids water, clear juice, clear tea, black coffee, fruit juices, non-citric and without pulp, carbonated beverages, Gatorade, Kool -Aid, plain Jello-O and plain ice popsicles.  4. Labs: DONE TODAY  5. Medication instructions in preparation for your procedure:   Tuesday 12/16 AM Do Not Take: Spironolactone    On the morning of your procedure, take any morning medicines  NOT listed above.  You may use sips of water.  6. Plan to go home the same day, you will only stay overnight if medically necessary. 7. Bring a current list of your medications and current insurance cards. 8. You MUST have a responsible person to drive you home. 9. Someone MUST be with you the first 24 hours after you arrive home or your discharge will be delayed. 10. Please wear clothes that are easy to get on and off and wear slip-on shoes.  Thank you for allowing us  to care for you!   -- [facility]  Follow-Up in: 1 week   At the [facility], you and your health needs are our priority. We have a designated team specialized in the treatment of Heart Failure. This Care Team includes your primary Heart Failure Specialized Cardiologist (physician), Advanced Practice Providers (APPs- Physician Assistants and Nurse Practitioners), and Pharmacist who all work together to provide you with the care you need, when you need it.   You may see any of the following providers on your designated Care Team at your next follow up:  Dr. Toribio Fuel Dr. Ezra Shuck Dr. Odis Brownie Greig Mosses, NP Caffie Shed, [place] [facility] [place], [place] Beckey Coe, NP Jordan Lee, NP Tinnie Redman, PharmD   Please be sure to bring in all your medications bottles to every appointment.   Need to Contact Us :  If you have any questions or concerns before your next appointment please send us  a  message through [place] or call our office at [phone].    TO LEAVE A MESSAGE FOR THE NURSE SELECT OPTION 2, PLEASE LEAVE A MESSAGE INCLUDING: YOUR NAME DATE OF BIRTH CALL BACK NUMBER REASON FOR CALL**this is important as we prioritize the call backs  YOU WILL RECEIVE A CALL BACK THE SAME DAY AS LONG AS YOU CALL BEFORE 4:00 PM

## 2024-08-09 NOTE — Progress Notes (Addendum)
 HEART & VASCULAR TRANSITION OF CARE CLINIC NOTE     Referring Physician: Norrine Sharper, MD   Chief Complaint: HFmrEF  HPI: Referred to clinic by Dr. Francesco with internal medicine teaching service for heart failure consultation.   Edward [name] is a 73 y.o. male with history of HFmrEF, bradycardia, varicose veins, ascending aortic aneurysm, HTN, HLD, CVA s/p TPA in 2016, BPH, coronary CTA 2022 with calcium  score of 0 and no CAD.   Echo 2016: EF 30-35%. HF diagnosed at time of CVA and given tPA in [place], [place].  Echo 2019: EF 50-55%  Echo 12/21: EF 35%  Echo 04/23: EF 35-40%, RV okay  Echo 01/25: EF 45-50%, RV okay  He's had multiple recent admissions. Admitted 9/8 w/ SIADH w/ severe hyponatremia, possible acute pancreatitis (normal lipase but CT imaging suggested) and E.Coli UTI. For management of SIADH, meloxicam  and spironolactone  were stopped. He was started on salt tablets. He was discharged 9/13 then readmitted 9/17 w/ GI bleeding and acute anemia. EGD with evidence of gastritis and gastric polyps which were biopsied. GI f/u arranged and started on protonix . He was then readmitted 9/28 with worsening shortness of breath and respiratory failure felt to be 2/2 combination of CHF but suspected primary pulmonary etiology.  Improved with IV diuresis and bronchodilators. Referred to [facility] and pulmonology.   Seen in [facility] clinic 06/14/24. Continued with significant dyspnea. Volume appeared okay. Repeat echo with EF 45-50%, grade I DD, RV function okay, RV mildly enlarged. Only noted improvement with recent addition of Symbicort . He was seen by Pulmonary. There was some concern for diaphragmatic paralysis but sniff test negative. PFTs scheduled. CT had been ordered but not completed d/t severe orthopnea.  Here today for TOC follow-up. Continues with significant exertional dyspnea and orthopnea. Gets winded during conversation in clinic. Can walk on a flat surface for a couple hundred  feet but gets wiped out if he attempts to talk or do anything more strenuous. No lower extremity edema. Weight has been stable. Taking all medications.   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   No tobacco use, denies heavy ETOH.  Father may have had CHF (hx unclear), mother had TIAs/strokes. No siblings with CHF.   Past Medical History:  Diagnosis Date   Abnormal EKG 05/29/2018   Sinus bradycardia and LAFB with TWI V4-V6, III and aVF   Benign essential HTN 05/29/2018   Cardiomyopathy (HCC)    a. dx by echo 07/2020.   Dilation of aorta    Environmental allergies    Fracture, ribs    H/O: varicose veins    Hearing loss    Hyperlipidemia LDL goal <70    Multiple food allergies    Rash    Seasonal allergies    Sinusitis    Stroke [facility])    s/p tpa    Current Outpatient Medications  Medication Sig Dispense Refill   acetaminophen  (TYLENOL ) 650 MG CR tablet Take 1,300 mg by mouth every 8 (eight) hours as needed for pain.     albuterol  (VENTOLIN  HFA) 108 (90 Base) MCG/ACT inhaler Inhale 2 puffs into the lungs every 4 (four) hours as needed for wheezing or shortness of breath.     aspirin  EC 81 MG tablet Take 81 mg by mouth at bedtime. Swallow whole.     cetirizine  (ZYRTEC  ALLERGY)  10 MG tablet Take 1 tablet (10 mg total) by mouth at bedtime. 15 tablet 0   Coenzyme Q10 (COQ-10) 100 MG CAPS Take 1 capsule by mouth in the morning and at bedtime.     finasteride  (PROSCAR ) 5 MG tablet TAKE 1 TABLET (5 MG TOTAL) BY MOUTH DAILY. 90 tablet 1   losartan  (COZAAR ) 25 MG tablet Take 1 tablet (25 mg total) by mouth daily. 90 tablet 0   metoprolol  succinate (TOPROL -XL) 25 MG 24 hr tablet TAKE 1 TABLET (25 MG) BY MOUTH DAILY. APPOINTMENT NEEDED FOR CONTINUATION OF REFILLS-1ST ATTEMPT 90 tablet 3   Multiple Vitamin (MULTIVITAMIN WITH MINERALS) TABS  tablet Take 1 tablet by mouth daily.     N-ACETYL CYSTEINE 600 MG TABS Take 1 tablet by mouth at bedtime.     omeprazole  (PRILOSEC) 10 MG capsule Take 10 mg by mouth daily as needed.     OVER THE COUNTER MEDICATION Take 1 capsule by mouth at bedtime. PQQ: Pyrroloquinoline Quinone     rosuvastatin  (CRESTOR ) 10 MG tablet TAKE 1 TABLET BY MOUTH EVERY DAY 90 tablet 1   tamsulosin  (FLOMAX ) 0.4 MG CAPS capsule TAKE 1 CAPSULE BY MOUTH EVERY DAY 90 capsule 1   budesonide -formoterol  (SYMBICORT ) 80-4.5 MCG/ACT inhaler Inhale 2 puffs into the lungs 2 (two) times daily as needed for up to 1 dose. (Patient not taking: Reported on 08/09/2024) 10.2 g 0   fluticasone  (FLONASE ) 50 MCG/ACT nasal spray Place 1 spray into both nostrils daily. (Patient not taking: Reported on 08/09/2024) 16 g 0   RA CRANBERRY 500 MG CAPS Take 1 capsule by mouth in the morning and at bedtime.     spironolactone  (ALDACTONE ) 25 MG tablet Take 1 tablet (25 mg total) by mouth daily. 30 tablet 6   No current facility-administered medications for this encounter.    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  Tobacco Use   Smoking status: Former   Smokeless tobacco: Never   Tobacco comments:    quit 40+ years ago  Vaping Use   Vaping status: Never Used  Substance and Sexual Activity   Alcohol use: Not Currently   Drug use: Never   Sexual activity: Yes    Partners: Female  Other Topics Concern   Not on file  Social History Narrative   Not on file   Social Drivers of Health   Tobacco Use: Medium Risk (08/09/2024)   Patient History    Smoking Tobacco Use: Former    Smokeless Tobacco Use: Never    Passive Exposure: Not on Actuary Strain:  Low Risk (05/30/2024)   Overall Financial Resource Strain (CARDIA)    Difficulty of Paying Living Expenses: Not very hard  Food Insecurity: No Food Insecurity (07/11/2024)   Epic    Worried About Radiation Protection Practitioner of Food in the Last Year: Never true    Ran Out of Food in the Last Year: Never true  Transportation Needs: No Transportation Needs (07/11/2024)   Epic    Lack of Transportation (Medical): No    Lack of Transportation (Non-Medical): No  Physical Activity: Sufficiently Active (07/11/2024)   Exercise Vital Sign    Days of Exercise per  Week: 7 days    Minutes of Exercise per Session: 30 min  Stress: No Stress Concern Present (07/11/2024)   [employer] of Occupational Health - Occupational Stress Questionnaire    Feeling of Stress: Not at all  Social Connections: Socially Integrated (07/11/2024)   Social Connection and Isolation Panel    Frequency of Communication with Friends and Family: More than three times a week    Frequency of Social Gatherings with Friends and Family: More than three times a week    Attends Religious Services: More than 4 times per year    Active Member of Clubs or Organizations: Yes    Attends Banker Meetings: More than 4 times per year    Marital Status: Married  Catering Manager Violence: Not At Risk (07/11/2024)   Epic    Fear of Current or Ex-Partner: No    Emotionally Abused: No    Physically Abused: No    Sexually Abused: No  Depression (PHQ2-9): Low Risk (07/11/2024)   Depression (PHQ2-9)    PHQ-2 Score: 1  Alcohol Screen: Low Risk (05/30/2024)   Alcohol Screen    Last Alcohol Screening Score (AUDIT): 0  Housing: Low Risk (07/11/2024)   Epic    Unable to Pay for Housing in the Last Year: No    Number of Times Moved in the Last Year: 0    Homeless in the Last Year: No  Utilities: Not At Risk (07/11/2024)   Epic    Threatened with loss of utilities: No  Health Literacy: Adequate Health Literacy (07/11/2024)   B1300 Health  Literacy    Frequency of need for help with medical instructions: Never      Family History  Problem Relation Age of Onset   CVA Mother    Heart failure Father        CHF   Brain cancer Brother        GBM   Colon polyps Neg Hx    Colon cancer Neg Hx    Esophageal cancer Neg Hx    Rectal cancer Neg Hx    Stomach cancer Neg Hx     Vitals:   08/09/24 1526  BP: 116/83  Pulse: 65  SpO2: 96%  Weight: 95.2 kg (209 lb 12.8 oz)  Height: 6' 1 (1.854 m)     PHYSICAL EXAM: General: Ambulated into clinic. Neck: no JVD Cor: Regular rate & rhythm. No murmurs. Lungs: mildly dyspneic with conversation Abdomen: soft, nontender, nondistended. Extremities: no edema Neuro: alert & orientedx3. Affect pleasant  ReDS 30%, normal   ASSESSMENT & PLAN: HFmrEF -EF as low as 35% in 2021 -Coronary CTA in 2/22: calcium  score 0, no significant CAD -Echo 04/23: EF 35-40%,  -Echo 01/25: EF 45-50%, RV mildly enlarged with okay function -Echo 11/25: EF 45-50%, grade I DD, RV mildly enlarged with okay function -Etiology not certain. May need to consider cMRI. -NYHA III, not entirely clear at this time if limitation is from HF or pulmonary source. Volume okay on exam and by ReDS which is 30%. 28% last visit. Not currently on loop diuretic. -Increase spiro to 25 mg daily -Unable to afford entresto , continue losartan  25 mg daily -Continue toprol  xl 25 mg nightly, denies wheezing -Have held off on SGLT2i with UTI 9/25 (ESBL E. Coli carrier) -See discussion below regarding dyspnea. Planning RHC.  2. Shortness of breath -Etiology not clear -Not volume overloaded on exam. LV okay but RV mildly enlarged on echo 11/25. ReDS 30% today and 28%  last visit.  -O2 sats 92%>>89% ambulating in the clinic.  -Undergoing workup with pulmonary. PFTs previously ordered. Unable to complete HiRes CT d/t orthopnea. He's had modest improvement with Symbicort . No PE on CTA chest in 10/25. -Discussed symptoms and  workup thus far with Dr. Cherrie. Will arrange for RHC to definitively assess volume status and PA pressures.   3. HTN -BP well-controlled.  -Meds as above.  4. Ascending aortic aneurysm -Aortic root measured 4.5 cm and ascending aorta 4 cm on echo 1/25 -Ascending aorta 4.1 cm on CT PE study 10/25 -Follow by primary Cardiology team  5. Hx CVA - S/p TPA - On aspirin  + statin  Referred to HFSW (PCP, Medications, Transportation, ETOH Abuse, Drug Abuse, Insurance, Surveyor, Quantity ): No Refer to Pharmacy: No Refer to Home Health: No Refer to [facility]: yes, 1 visit pending workup Refer to General Cardiology: No, already established  Follow up  4 weeks with APP after RHC. May eventually be able to graduate from AHF clinic and follow-up with general cardiology pending further workup.   Manuelita Dutch, PA-C

## 2024-08-10 ENCOUNTER — Other Ambulatory Visit (HOSPITAL_COMMUNITY): Payer: Self-pay | Admitting: *Deleted

## 2024-08-10 ENCOUNTER — Ambulatory Visit (HOSPITAL_COMMUNITY): Payer: Self-pay | Admitting: Physician Assistant

## 2024-08-10 DIAGNOSIS — I5022 Chronic systolic (congestive) heart failure: Secondary | ICD-10-CM

## 2024-08-14 ENCOUNTER — Other Ambulatory Visit: Payer: Self-pay | Admitting: Student

## 2024-08-14 ENCOUNTER — Encounter (HOSPITAL_COMMUNITY): Admission: RE | Disposition: A | Payer: Self-pay | Source: Home / Self Care | Attending: Internal Medicine

## 2024-08-14 ENCOUNTER — Other Ambulatory Visit: Payer: Self-pay

## 2024-08-14 ENCOUNTER — Encounter (HOSPITAL_COMMUNITY): Payer: Self-pay | Admitting: Internal Medicine

## 2024-08-14 ENCOUNTER — Ambulatory Visit (HOSPITAL_COMMUNITY)
Admission: RE | Admit: 2024-08-14 | Discharge: 2024-08-14 | Disposition: A | Discharge status: Home / Self Care | Attending: Internal Medicine | Admitting: Internal Medicine

## 2024-08-14 DIAGNOSIS — I7121 Aneurysm of the ascending aorta, without rupture: Secondary | ICD-10-CM | POA: Insufficient documentation

## 2024-08-14 DIAGNOSIS — Z7982 Long term (current) use of aspirin: Secondary | ICD-10-CM | POA: Diagnosis not present

## 2024-08-14 DIAGNOSIS — E785 Hyperlipidemia, unspecified: Secondary | ICD-10-CM | POA: Insufficient documentation

## 2024-08-14 DIAGNOSIS — I110 Hypertensive heart disease with heart failure: Secondary | ICD-10-CM | POA: Insufficient documentation

## 2024-08-14 DIAGNOSIS — Z8673 Personal history of transient ischemic attack (TIA), and cerebral infarction without residual deficits: Secondary | ICD-10-CM | POA: Diagnosis not present

## 2024-08-14 DIAGNOSIS — Z79899 Other long term (current) drug therapy: Secondary | ICD-10-CM | POA: Diagnosis not present

## 2024-08-14 DIAGNOSIS — N400 Enlarged prostate without lower urinary tract symptoms: Secondary | ICD-10-CM | POA: Insufficient documentation

## 2024-08-14 DIAGNOSIS — I5022 Chronic systolic (congestive) heart failure: Secondary | ICD-10-CM | POA: Diagnosis not present

## 2024-08-14 HISTORY — PX: RIGHT HEART CATH: CATH118263

## 2024-08-14 LAB — POCT I-STAT EG7
Acid-Base Excess: 1 mmol/L (ref 0.0–2.0)
Bicarbonate: 28.1 mmol/L — ABNORMAL HIGH (ref 20.0–28.0)
Calcium, Ion: 1.22 mmol/L (ref 1.15–1.40)
HCT: 38 % — ABNORMAL LOW (ref 39.0–52.0)
Hemoglobin: 12.9 g/dL — ABNORMAL LOW (ref 13.0–17.0)
O2 Saturation: 67 %
Potassium: 4.2 mmol/L (ref 3.5–5.1)
Sodium: 131 mmol/L — ABNORMAL LOW (ref 135–145)
TCO2: 30 mmol/L (ref 22–32)
pCO2, Ven: 53.2 mmHg (ref 44–60)
pH, Ven: 7.331 (ref 7.25–7.43)
pO2, Ven: 38 mmHg (ref 32–45)

## 2024-08-14 LAB — BASIC METABOLIC PANEL WITH GFR
Anion gap: 9 (ref 5–15)
BUN: 13 mg/dL (ref 8–23)
CO2: 29 mmol/L (ref 22–32)
Calcium: 8.9 mg/dL (ref 8.9–10.3)
Chloride: 93 mmol/L — ABNORMAL LOW (ref 98–111)
Creatinine, Ser: 0.99 mg/dL (ref 0.61–1.24)
GFR, Estimated: >60 mL/min (ref >60)
Glucose, Bld: 94 mg/dL (ref 70–99)
Potassium: 5 mmol/L (ref 3.5–5.1)
Sodium: 131 mmol/L — ABNORMAL LOW (ref 135–145)

## 2024-08-14 SURGERY — RIGHT HEART CATH
Anesthesia: LOCAL

## 2024-08-14 MED ORDER — SODIUM CHLORIDE 0.9 % IV SOLN: 250.0000 mL | INTRAVENOUS | Status: DC | PRN

## 2024-08-14 MED ORDER — HYDRALAZINE HCL 20 MG/ML IJ SOLN: 10.0000 mg | INTRAMUSCULAR | Status: DC | PRN

## 2024-08-14 MED ORDER — FENTANYL CITRATE (PF) 100 MCG/2ML IJ SOLN
INTRAMUSCULAR | Status: DC | PRN
  Administered 2024-08-14: 09:00:00 25 ug via INTRAVENOUS

## 2024-08-14 MED ORDER — FREE WATER: 250.0000 mL | Freq: Once | Status: DC

## 2024-08-14 MED ORDER — SODIUM CHLORIDE 0.9% FLUSH: 3.0000 mL | INTRAVENOUS | Status: DC | PRN

## 2024-08-14 MED ORDER — ACETAMINOPHEN 325 MG PO TABS: 650.0000 mg | ORAL_TABLET | ORAL | Status: DC | PRN

## 2024-08-14 MED ORDER — SODIUM CHLORIDE 0.9% FLUSH: 3.0000 mL | Freq: Two times a day (BID) | INTRAVENOUS | Status: DC

## 2024-08-14 MED ORDER — LABETALOL HCL 5 MG/ML IV SOLN: 10.0000 mg | INTRAVENOUS | Status: DC | PRN

## 2024-08-14 MED ORDER — HEPARIN (PORCINE) IN NACL 1000-0.9 UT/500ML-% IV SOLN
INTRAVENOUS | Status: DC | PRN
  Administered 2024-08-14: 09:00:00 500 mL

## 2024-08-14 MED ORDER — LIDOCAINE HCL (PF) 1 % IJ SOLN
INTRAMUSCULAR | Status: DC | PRN
  Administered 2024-08-14: 09:00:00 5 mL

## 2024-08-14 MED ORDER — ONDANSETRON HCL 4 MG/2ML IJ SOLN: 4.0000 mg | Freq: Four times a day (QID) | INTRAMUSCULAR | Status: DC | PRN

## 2024-08-14 MED ORDER — FENTANYL CITRATE (PF) 100 MCG/2ML IJ SOLN
INTRAMUSCULAR | Status: AC
  Filled 2024-08-14: qty 2

## 2024-08-14 MED ORDER — MIDAZOLAM HCL (PF) 2 MG/2ML IJ SOLN
INTRAMUSCULAR | Status: DC | PRN
  Administered 2024-08-14: 09:00:00 1 mg via INTRAVENOUS

## 2024-08-14 MED ORDER — MIDAZOLAM HCL 2 MG/2ML IJ SOLN
INTRAMUSCULAR | Status: AC
  Filled 2024-08-14: qty 2

## 2024-08-14 SURGICAL SUPPLY — 6 items
CATH SWAN GANZ 7F STRAIGHT (CATHETERS) IMPLANT
GLIDESHEATH SLENDER 7FR .021G (SHEATH) IMPLANT
GUIDEWIRE .025 260CM (WIRE) IMPLANT
PACK CARDIAC CATHETERIZATION (CUSTOM PROCEDURE TRAY) ×1 IMPLANT
TRANSDUCER W/STOPCOCK (MISCELLANEOUS) IMPLANT
TUBING ART PRESS 72 MALE/FEM (TUBING) IMPLANT

## 2024-08-14 NOTE — Telephone Encounter (Unsigned)
 Copied from CRM #[id number]. Topic: Clinical - Medication Refill >> Aug 14, 2024  2:53 PM Debby BROCKS wrote: Medication: tamsulosin  (FLOMAX ) 0.4 MG CAPS capsule  Has the patient contacted their pharmacy? Yes (Agent: If no, request that the patient contact the pharmacy for the refill. If patient does not wish to contact the pharmacy document the reason why and proceed with request.) (Agent: If yes, when and what did the pharmacy advise?)  This is the patient's preferred pharmacy:  [id number] - [place], [place] - [address] AT [address] OF [facility] [address] [place] [address] Phone: [phone] Fax: [phone]  Is this the correct pharmacy for this prescription? Yes If no, delete pharmacy and type the correct one.   Has the prescription been filled recently? No  Is the patient out of the medication? Yes  Has the patient been seen for an appointment in the last year OR does the patient have an upcoming appointment? Yes  Can we respond through MyChart? Yes  Agent: Please be advised that Rx refills may take up to 3 business days. We ask that you follow-up with your pharmacy.

## 2024-08-14 NOTE — Discharge Instructions (Signed)

## 2024-08-14 NOTE — Progress Notes (Signed)
 Discharge instructions reviewed with patient and wife Edward [name] at the bedside. Denies questions or concerns. PT was able to ambulated tolerate PO intake void in the bathroom prior to discharge. PT escorted from the uinit via wheel chair to personal vehicle.

## 2024-08-14 NOTE — Interval H&P Note (Signed)
 History and Physical Interval Note:  08/14/2024 8:05 AM  [name]  has presented today for surgery, with the diagnosis of heart fallure.  The various methods of treatment have been discussed with the patient and family. After consideration of risks, benefits and other options for treatment, the patient has consented to  Procedures: RIGHT HEART CATH (N/A) as a surgical intervention.  The patient's history has been reviewed, patient examined, no change in status, stable for surgery.  I have reviewed the patient's chart and labs.  Questions were answered to the patient's satisfaction.     Jaceyon Strole

## 2024-08-15 LAB — POCT I-STAT EG7
Acid-Base Excess: 1 mmol/L (ref 0.0–2.0)
Acid-Base Excess: 1 mmol/L (ref 0.0–2.0)
Bicarbonate: 28.3 mmol/L — ABNORMAL HIGH (ref 20.0–28.0)
Bicarbonate: 28.9 mmol/L — ABNORMAL HIGH (ref 20.0–28.0)
Calcium, Ion: 1.2 mmol/L (ref 1.15–1.40)
Calcium, Ion: 1.23 mmol/L (ref 1.15–1.40)
HCT: 38 % — ABNORMAL LOW (ref 39.0–52.0)
HCT: 39 % (ref 39.0–52.0)
Hemoglobin: 12.9 g/dL — ABNORMAL LOW (ref 13.0–17.0)
Hemoglobin: 13.3 g/dL (ref 13.0–17.0)
O2 Saturation: 65 %
O2 Saturation: 69 %
Potassium: 4.3 mmol/L (ref 3.5–5.1)
Potassium: 4.3 mmol/L (ref 3.5–5.1)
Sodium: 130 mmol/L — ABNORMAL LOW (ref 135–145)
Sodium: 131 mmol/L — ABNORMAL LOW (ref 135–145)
TCO2: 30 mmol/L (ref 22–32)
TCO2: 31 mmol/L (ref 22–32)
pCO2, Ven: 54.1 mmHg (ref 44–60)
pCO2, Ven: 57.5 mmHg (ref 44–60)
pH, Ven: 7.31 (ref 7.25–7.43)
pH, Ven: 7.327 (ref 7.25–7.43)
pO2, Ven: 38 mmHg (ref 32–45)
pO2, Ven: 40 mmHg (ref 32–45)

## 2024-08-15 MED ORDER — TAMSULOSIN HCL 0.4 MG PO CAPS: 0.4000 mg | ORAL_CAPSULE | Freq: Every evening | ORAL | 3 refills | Status: AC

## 2024-08-22 ENCOUNTER — Ambulatory Visit: Payer: Self-pay | Admitting: Internal Medicine

## 2024-09-03 ENCOUNTER — Telehealth (HOSPITAL_COMMUNITY): Payer: Self-pay

## 2024-09-03 NOTE — Telephone Encounter (Signed)
 Called to confirm/remind patient of their appointment at the [facility] on 09/04/24.   Appointment:   [x] Confirmed  [] Left mess   [] No answer/No voice mail  [] VM Full/unable to leave message  [] Phone not in service  Patient reminded to bring all medications and/or complete list.  Confirmed patient has transportation. Gave directions, instructed to utilize valet parking.

## 2024-09-03 NOTE — Progress Notes (Addendum)
 "   [facility] Consult Note  PCP: Norrine Sharper, MD  HF Cardiologist: Dr. Cherrie  HPI: Edward [name] is a 74 y.o. male with history of HFmrEF, bradycardia, varicose veins, ascending aortic aneurysm, HTN, HLD, CVA s/p TPA in 2016, BPH, coronary CTA 2022 with calcium  score of 0 and no CAD.   Echo 2016: EF 30-35%. HF diagnosed at time of CVA and given tPA in [place], [place].  Echo 2019: EF 50-55%  Echo 12/21: EF 35%  Echo 04/23: EF 35-40%, RV okay  Echo 01/25: EF 45-50%, RV okay  He's had multiple recent admissions. Admitted 05/07/24 w/ SIADH w/ severe hyponatremia, possible acute pancreatitis (normal lipase but CT imaging suggested) and E.Coli UTI. For management of SIADH, meloxicam  and spironolactone  were stopped. He was started on salt tablets. He was discharged 05/12/24 then readmitted 05/16/24 w/ GI bleeding and acute anemia. EGD with evidence of gastritis and gastric polyps which were biopsied. GI f/u arranged and started on protonix . He was then readmitted 05/27/24 with worsening shortness of breath and respiratory failure felt to be 2/2 combination of CHF but suspected primary pulmonary etiology.  Improved with IV diuresis and bronchodilators. Referred to [facility] and pulmonology.   Seen in [facility] clinic 06/14/24. Continued with significant dyspnea. Volume appeared okay. Repeat echo with EF 45-50%, grade I DD, RV function okay, RV mildly enlarged. Only noted improvement with recent addition of Symbicort . He was seen by Pulmonary. There was some concern for diaphragmatic paralysis but sniff test negative. PFTs scheduled. CT had been ordered but not completed d/t severe orthopnea.  Underwent RHC 12/25 showing normal filling pressures and preserved CI; very symptomatic during case and symptoms improved with low-dose anxiolytic.  Today he returns for post RHC follow up. Overall feeling fine. Still struggles with breathing, says he cannot exercise without getting winded or lay  flat without feeling like his breathing is shutting down. Otherwise no undue dyspnea, can walk up steps and carry groceries. Denies palpitations, abnormal bleeding, CP, dizziness, edema, or PND. He does have orthopnea. Appetite ok. Weight at home 203 pounds. Taking all medications. 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 Rare ETOH, no drugs or tobacco use.   Father may have had CHF (hx unclear), mother had TIAs/strokes. No siblings with CHF.   Past Medical History:  Diagnosis Date   Abnormal EKG 05/29/2018   Sinus bradycardia and LAFB with TWI V4-V6, III and aVF   Benign essential HTN 05/29/2018   Cardiomyopathy (HCC)    a. dx by echo 07/2020.   Dilation of aorta    Environmental allergies    Fracture, ribs    H/O: varicose veins    Hearing loss    Hyperlipidemia LDL goal <70    Multiple food allergies    Rash    Seasonal allergies    Sinusitis    Stroke [facility])    s/p tpa   Current Outpatient Medications  Medication Sig Dispense Refill   acetaminophen  (TYLENOL ) 650 MG CR tablet Take 1,300 mg by mouth every 8 (eight) hours as needed for pain.     albuterol  (VENTOLIN  HFA) 108 (90 Base) MCG/ACT inhaler Inhale 2 puffs into the lungs every 4 (four) hours as needed for wheezing or shortness of breath.     aspirin  EC 81 MG tablet Take 81 mg by mouth at bedtime. Swallow  whole.     cetirizine  (ZYRTEC  ALLERGY) 10 MG tablet Take 1 tablet (10 mg total) by mouth at bedtime. (Patient taking differently: Take 10 mg by mouth daily as needed for allergies.) 15 tablet 0   Coenzyme Q10 (COQ-10 PO) Take 1 capsule by mouth in the morning.     finasteride  (PROSCAR ) 5 MG tablet TAKE 1 TABLET (5 MG TOTAL) BY MOUTH DAILY. 90 tablet 1   fluticasone  (FLONASE ) 50 MCG/ACT nasal spray Place 1 spray into both nostrils daily. (Patient taking differently: Place 1  spray into both nostrils daily as needed for allergies.) 16 g 0   losartan  (COZAAR ) 25 MG tablet Take 1 tablet (25 mg total) by mouth daily. (Patient taking differently: Take 25 mg by mouth at bedtime.) 90 tablet 0   metoprolol  succinate (TOPROL -XL) 25 MG 24 hr tablet TAKE 1 TABLET (25 MG) BY MOUTH DAILY. APPOINTMENT NEEDED FOR CONTINUATION OF REFILLS-1ST ATTEMPT (Patient taking differently: Take 25 mg by mouth at bedtime. Take 1 tablet (25 mg) by mouth daily. Appointment needed for continuation of refills-1st attempt) 90 tablet 3   Multiple Vitamin (MULTIVITAMIN WITH MINERALS) TABS tablet Take 1 tablet by mouth in the morning.     N-ACETYL CYSTEINE 600 MG TABS Take 1 tablet by mouth at bedtime.     omeprazole  (PRILOSEC) 20 MG capsule Take 20 mg by mouth in the morning.     OVER THE COUNTER MEDICATION Take 1 capsule by mouth at bedtime. PQQ: Pyrroloquinoline Quinone     RA CRANBERRY 500 MG CAPS Take 1 capsule by mouth in the morning and at bedtime.     rosuvastatin  (CRESTOR ) 10 MG tablet TAKE 1 TABLET BY MOUTH EVERY DAY (Patient taking differently: Take 10 mg by mouth at bedtime.) 90 tablet 1   spironolactone  (ALDACTONE ) 25 MG tablet Take 1 tablet (25 mg total) by mouth daily. 30 tablet 6   tamsulosin  (FLOMAX ) 0.4 MG CAPS capsule Take 1 capsule (0.4 mg total) by mouth at bedtime. 90 capsule 3   No current facility-administered medications for this encounter.   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  Tobacco Use   Smoking status: Former   Smokeless tobacco: Never   Tobacco comments:    quit 40+ years ago  Vaping Use   Vaping status: Never Used  Substance and Sexual Activity   Alcohol use: Not Currently   Drug use: Never    Sexual activity: Yes    Partners: Female  Other Topics Concern   Not on file  Social History Narrative   Not on file   Social Drivers of Health   Tobacco Use: Medium Risk (09/04/2024)   Patient History    Smoking Tobacco Use: Former    Smokeless Tobacco Use: Never    Passive Exposure: Not on Actuary Strain: Low Risk (05/30/2024)   Overall Financial Resource Strain (CARDIA)    Difficulty of Paying Living Expenses: Not very hard  Food Insecurity: No Food Insecurity (07/11/2024)   Epic    Worried About Radiation Protection Practitioner of Food in the Last Year: Never true    Ran Out of Food in the Last Year: Never true  Transportation Needs: No Transportation Needs (07/11/2024)  Epic    Lack of Transportation (Medical): No    Lack of Transportation (Non-Medical): No  Physical Activity: Sufficiently Active (07/11/2024)   Exercise Vital Sign    Days of Exercise per Week: 7 days    Minutes of Exercise per Session: 30 min  Stress: No Stress Concern Present (07/11/2024)   [employer] of Occupational Health - Occupational Stress Questionnaire    Feeling of Stress: Not at all  Social Connections: Socially Integrated (07/11/2024)   Social Connection and Isolation Panel    Frequency of Communication with Friends and Family: More than three times a week    Frequency of Social Gatherings with Friends and Family: More than three times a week    Attends Religious Services: More than 4 times per year    Active Member of Clubs or Organizations: Yes    Attends Banker Meetings: More than 4 times per year    Marital Status: Married  Catering Manager Violence: Not At Risk (07/11/2024)   Epic    Fear of Current or Ex-Partner: No    Emotionally Abused: No    Physically Abused: No    Sexually Abused: No  Depression (PHQ2-9): Low Risk (07/11/2024)   Depression (PHQ2-9)    PHQ-2 Score: 1  Alcohol Screen: Low Risk (05/30/2024)   Alcohol Screen    Last Alcohol Screening Score  (AUDIT): 0  Housing: Low Risk (07/11/2024)   Epic    Unable to Pay for Housing in the Last Year: No    Number of Times Moved in the Last Year: 0    Homeless in the Last Year: No  Utilities: Not At Risk (07/11/2024)   Epic    Threatened with loss of utilities: No  Health Literacy: Adequate Health Literacy (07/11/2024)   B1300 Health Literacy    Frequency of need for help with medical instructions: Never   Family History  Problem Relation Age of Onset   CVA Mother    Heart failure Father        CHF   Brain cancer Brother        GBM   Colon polyps Neg Hx    Colon cancer Neg Hx    Esophageal cancer Neg Hx    Rectal cancer Neg Hx    Stomach cancer Neg Hx    Wt Readings from Last 3 Encounters:  09/04/24 92.1 kg (203 lb)  08/14/24 90.7 kg (200 lb)  08/09/24 95.2 kg (209 lb 12.8 oz)   BP 128/70   Pulse 78   Wt 92.1 kg (203 lb)   SpO2 96%   BMI 26.78 kg/m   PHYSICAL EXAM: General:  NAD. No resp difficulty, walked into clinic HEENT: Normal Neck: Supple. No JVD. Cor: Regular rate & rhythm. No rubs, gallops or murmurs. Lungs: Clear Abdomen: Soft, nontender, nondistended.  Extremities: No cyanosis, clubbing, rash, edema Neuro: Alert & oriented x 3, moves all 4 extremities w/o difficulty. Affect pleasant.  ReDs reading: 34%, normal  ASSESSMENT & PLAN: HFmrEF - EF as low as 35% in 2021 - Coronary CTA in 2/22: calcium  score 0, no significant CAD - Echo 04/23: EF 35-40%,  - Echo 01/25: EF 45-50%, RV mildly enlarged with okay function - Echo 11/25: EF 45-50%, grade I DD, RV mildly enlarged with okay function - Etiology not certain. May need to consider cMRI. - RHC 12/25 with well compensated hemodynamics, CI 2.7 - NYHA I, he has dyspnea with exercising. Symptoms likely pulmonary vs allergy related. Volume stable today,  ReDs normal at 34% - Continue spiro 25 mg daily - Continue losartan  25 mg daily (Entresto  is cost prohibitive) - Continue Toprol  XL 25 mg nightly, denies  wheezing - Have held off on SGLT2i with UTI 9/25 (ESBL E. Coli carrier) - Does not need daily loop - Discussed importance of daily exercise regimen  2. Shortness of breath - Etiology not clear - Not volume overloaded on exam. LV okay but RV mildly enlarged on echo 11/25. ReDS 34% normal today. - Undergoing workup with pulmonary. PFTs previously ordered. Unable to complete HiRes CT d/t orthopnea. He's had modest improvement with Symbicort . No PE on CTA chest in 10/25. - Sniff test to rule out diaphragmatic dysfunction was negative, still needs PFTs - He has been referred to ENT, may need allergy/asthma referral. - RHC completely normal, current symptoms not related to HF  3. HTN - BP well-controlled.  - Meds as above.  4. Ascending aortic aneurysm - Aortic root measured 4.5 cm and ascending aorta 4 cm on echo 1/25 - Ascending aorta 4.1 cm on CT PE study 10/25 - Follow by primary Cardiology team  5. Hx CVA - S/p TPA - On aspirin  + statin  [facility] clinic, follow up with general cardiology as scheduled. Discussed with Dr. Cherrie Harlene Gainer, FNP-BC 09/04/2024  "

## 2024-09-04 ENCOUNTER — Ambulatory Visit (HOSPITAL_COMMUNITY)
Admission: RE | Admit: 2024-09-04 | Discharge: 2024-09-04 | Disposition: A | Discharge status: Home / Self Care | Source: Ambulatory Visit | Attending: Family Medicine | Admitting: Family Medicine

## 2024-09-04 ENCOUNTER — Encounter (HOSPITAL_COMMUNITY): Payer: Self-pay

## 2024-09-04 VITALS — BP 128/70 | HR 78 | Wt 203.0 lb

## 2024-09-04 DIAGNOSIS — Z87891 Personal history of nicotine dependence: Secondary | ICD-10-CM | POA: Diagnosis not present

## 2024-09-04 DIAGNOSIS — I5022 Chronic systolic (congestive) heart failure: Secondary | ICD-10-CM | POA: Diagnosis not present

## 2024-09-04 DIAGNOSIS — I10 Essential (primary) hypertension: Secondary | ICD-10-CM

## 2024-09-04 DIAGNOSIS — Z8673 Personal history of transient ischemic attack (TIA), and cerebral infarction without residual deficits: Secondary | ICD-10-CM | POA: Insufficient documentation

## 2024-09-04 DIAGNOSIS — I77810 Thoracic aortic ectasia: Secondary | ICD-10-CM | POA: Diagnosis not present

## 2024-09-04 DIAGNOSIS — B9620 Unspecified Escherichia coli [E. coli] as the cause of diseases classified elsewhere: Secondary | ICD-10-CM | POA: Insufficient documentation

## 2024-09-04 DIAGNOSIS — I7121 Aneurysm of the ascending aorta, without rupture: Secondary | ICD-10-CM | POA: Insufficient documentation

## 2024-09-04 DIAGNOSIS — Z87440 Personal history of urinary (tract) infections: Secondary | ICD-10-CM | POA: Insufficient documentation

## 2024-09-04 DIAGNOSIS — I110 Hypertensive heart disease with heart failure: Secondary | ICD-10-CM | POA: Diagnosis present

## 2024-09-04 DIAGNOSIS — I5020 Unspecified systolic (congestive) heart failure: Secondary | ICD-10-CM | POA: Insufficient documentation

## 2024-09-04 DIAGNOSIS — N390 Urinary tract infection, site not specified: Secondary | ICD-10-CM | POA: Diagnosis not present

## 2024-09-04 DIAGNOSIS — Z79899 Other long term (current) drug therapy: Secondary | ICD-10-CM | POA: Insufficient documentation

## 2024-09-04 DIAGNOSIS — E871 Hypo-osmolality and hyponatremia: Secondary | ICD-10-CM | POA: Insufficient documentation

## 2024-09-04 DIAGNOSIS — N400 Enlarged prostate without lower urinary tract symptoms: Secondary | ICD-10-CM | POA: Insufficient documentation

## 2024-09-04 DIAGNOSIS — E785 Hyperlipidemia, unspecified: Secondary | ICD-10-CM | POA: Insufficient documentation

## 2024-09-04 DIAGNOSIS — Z7982 Long term (current) use of aspirin: Secondary | ICD-10-CM | POA: Insufficient documentation

## 2024-09-04 DIAGNOSIS — R0602 Shortness of breath: Secondary | ICD-10-CM | POA: Diagnosis not present

## 2024-09-04 MED ORDER — METOPROLOL SUCCINATE ER 25 MG PO TB24: 25.0000 mg | ORAL_TABLET | Freq: Every evening | ORAL | 3 refills | Status: DC

## 2024-09-04 MED ORDER — SPIRONOLACTONE 25 MG PO TABS: 25.0000 mg | ORAL_TABLET | Freq: Every day | ORAL | 3 refills | Status: DC

## 2024-09-04 MED ORDER — LOSARTAN POTASSIUM 25 MG PO TABS: 25.0000 mg | ORAL_TABLET | Freq: Every evening | ORAL | 3 refills | Status: DC

## 2024-09-04 MED ORDER — FINASTERIDE 5 MG PO TABS: 5.0000 mg | ORAL_TABLET | Freq: Every day | ORAL | 3 refills | Status: AC

## 2024-09-04 MED ORDER — LOSARTAN POTASSIUM 25 MG PO TABS: 25.0000 mg | ORAL_TABLET | Freq: Every evening | ORAL | 3 refills | Status: AC

## 2024-09-04 MED ORDER — ROSUVASTATIN CALCIUM 10 MG PO TABS: 10.0000 mg | ORAL_TABLET | Freq: Every evening | ORAL | 3 refills | Status: DC

## 2024-09-04 MED ORDER — METOPROLOL SUCCINATE ER 25 MG PO TB24: 25.0000 mg | ORAL_TABLET | Freq: Every evening | ORAL | 3 refills | Status: AC

## 2024-09-04 MED ORDER — ROSUVASTATIN CALCIUM 10 MG PO TABS: 10.0000 mg | ORAL_TABLET | Freq: Every evening | ORAL | 3 refills | Status: AC

## 2024-09-04 MED ORDER — SPIRONOLACTONE 25 MG PO TABS: 25.0000 mg | ORAL_TABLET | Freq: Every day | ORAL | 3 refills | Status: AC

## 2024-09-04 NOTE — Addendum Note (Signed)
 Encounter addended by: Glena Harlene HERO, FNP on: 09/04/2024 3:27 PM  Actions taken: Clinical Note Signed

## 2024-09-04 NOTE — Patient Instructions (Signed)
 CONGRATULATIONS YOU HAVE GRADUATED FROM THE [facility].  PLEASE FOLLOW UP WITH DR. SHLOMO AS SCHEDULED.  At the [facility], you and your health needs are our priority. As part of our continuing mission to provide you with exceptional heart care, we have created designated Provider Care Teams. These Care Teams include your primary Cardiologist (physician) and Advanced Practice Providers (APPs- Physician Assistants and Nurse Practitioners) who all work together to provide you with the care you need, when you need it.   You may see any of the following providers on your designated Care Team at your next follow up: Dr Toribio Fuel Dr Ezra Shuck Dr. Morene Brownie Greig Mosses, NP Caffie Shed, [place] [facility] [place], [place] Beckey Coe, NP Jordan Lee, NP Ellouise Class, NP Tinnie Redman, PharmD Jaun Bash, PharmD   Please be sure to bring in all your medications bottles to every appointment.    Thank you for choosing [facility]

## 2024-09-18 ENCOUNTER — Ambulatory Visit: Admitting: Primary Care

## 2024-09-18 NOTE — Progress Notes (Unsigned)
 "  @Patient  ID: Edward [name], male    DOB: [birth year], 74 y.o.   MRN: [id number]  No chief complaint on file.   Referring provider: Norrine Sharper, MD  HPI:   Previous [facility] pulmonary encounter:  07/12/24- Dr. Neda  Was recently seen by Dr. 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  History of Present Illness  Seen in November for shortness of breath and orthopnea Hx diastolic heart failure No emphysema on CT imaging  Obtain PFTs  Albuterol  as needed  Unclear reason for progressivel sob    Allergies[1]  Immunization History  Administered Date(s) Administered   Fluad Quad(high Dose 65+) 06/11/2020   Janssen (J&J) SARS-COV-2 Vaccination 12/29/2019   Pneumococcal Conjugate-13 05/24/2019   Pneumococcal-Unspecified 06/29/2020    Past Medical History:  Diagnosis Date   Abnormal EKG 05/29/2018   Sinus bradycardia and LAFB with TWI V4-V6, III and aVF   Benign essential HTN 05/29/2018   Cardiomyopathy (HCC)    a. dx by echo 07/2020.   Dilation of aorta    Environmental allergies    Fracture,  ribs    H/O: varicose veins    Hearing loss    Hyperlipidemia LDL goal <70    Multiple food allergies    Rash    Seasonal allergies    Sinusitis    Stroke (HCC)    s/p tpa    Tobacco History: Tobacco Use History[2] Counseling given: Not Answered Tobacco comments: quit 40+ years ago   Outpatient Medications Prior to Visit  Medication Sig Dispense Refill   acetaminophen  (TYLENOL ) 650 MG CR tablet Take 1,300 mg by mouth every 8 (eight) hours as needed for pain.     albuterol  (VENTOLIN  HFA) 108 (90 Base) MCG/ACT inhaler Inhale 2 puffs into the lungs every 4 (four) hours as needed for wheezing or shortness of breath.     aspirin  EC 81 MG tablet Take 81 mg by mouth at bedtime. Swallow whole.     cetirizine  (ZYRTEC  ALLERGY) 10 MG tablet Take 1 tablet (10 mg total) by mouth at bedtime. (Patient taking differently: Take 10 mg by mouth daily as needed for allergies.) 15 tablet 0   Coenzyme Q10 (COQ-10 PO) Take 1 capsule by mouth in the morning.     finasteride  (PROSCAR ) 5 MG tablet Take 1 tablet (5 mg total) by mouth daily. 90 tablet 3   fluticasone  (FLONASE ) 50 MCG/ACT nasal spray Place 1 spray into both nostrils daily. (Patient taking differently: Place 1 spray into both nostrils daily as needed for allergies.) 16 g 0   losartan  (COZAAR ) 25 MG tablet Take 1 tablet (  25 mg total) by mouth at bedtime. 90 tablet 3   metoprolol  succinate (TOPROL -XL) 25 MG 24 hr tablet Take 1 tablet (25 mg total) by mouth at bedtime. Take 1 tablet (25 mg) by mouth daily. Appointment needed for continuation of refills-1st attempt 90 tablet 3   Multiple Vitamin (MULTIVITAMIN WITH MINERALS) TABS tablet Take 1 tablet by mouth in the morning.     N-ACETYL CYSTEINE 600 MG TABS Take 1 tablet by mouth at bedtime.     omeprazole  (PRILOSEC) 20 MG capsule Take 20 mg by mouth in the morning.     OVER THE COUNTER MEDICATION Take 1 capsule by mouth at bedtime. PQQ: Pyrroloquinoline Quinone     RA CRANBERRY 500 MG CAPS Take 1  capsule by mouth in the morning and at bedtime.     rosuvastatin  (CRESTOR ) 10 MG tablet Take 1 tablet (10 mg total) by mouth at bedtime. 90 tablet 3   spironolactone  (ALDACTONE ) 25 MG tablet Take 1 tablet (25 mg total) by mouth daily. 90 tablet 3   tamsulosin  (FLOMAX ) 0.4 MG CAPS capsule Take 1 capsule (0.4 mg total) by mouth at bedtime. 90 capsule 3   No facility-administered medications prior to visit.      Review of Systems  Review of Systems   Physical Exam  There were no vitals taken for this visit. Physical Exam  ***  Lab Results:  CBC    Component Value Date/Time   WBC 7.0 08/09/2024 1620   RBC 4.59 08/09/2024 1620   HGB 12.9 (L) 08/14/2024 0924   HGB 14.3 12/22/2021 1535   HCT 38.0 (L) 08/14/2024 0924   HCT 41.7 12/22/2021 1535   PLT 317 08/09/2024 1620   PLT 293 12/22/2021 1535   MCV 86.5 08/09/2024 1620   MCV 94 12/22/2021 1535   MCH 29.0 08/09/2024 1620   MCHC 33.5 08/09/2024 1620   RDW 12.6 08/09/2024 1620   RDW 12.0 12/22/2021 1535   LYMPHSABS 1.2 06/29/2024 0915   MONOABS 0.7 06/29/2024 0915   EOSABS 0.5 06/29/2024 0915   BASOSABS 0.1 06/29/2024 0915    BMET    Component Value Date/Time   NA 131 (L) 08/14/2024 0924   NA 137 08/22/2023 1633   K 4.3 08/14/2024 0924   CL 93 (L) 08/14/2024 0759   CO2 29 08/14/2024 0759   GLUCOSE 94 08/14/2024 0759   BUN 13 08/14/2024 0759   BUN 12 08/22/2023 1633   CREATININE 0.99 08/14/2024 0759   CALCIUM  8.9 08/14/2024 0759   GFRNONAA >60 08/14/2024 0759   GFRAA 76 09/04/2020 1648    BNP    Component Value Date/Time   BNP 56.3 08/09/2024 1620    ProBNP    Component Value Date/Time   PROBNP 65.0 06/29/2024 0915    Imaging: No results found.   Assessment & Plan:   No problem-specific Assessment & Plan notes found for this encounter.   There are no diagnoses linked to this encounter.  Assessment and Plan Assessment & Plan       I personally spent a total of *** minutes in the care  of the patient today including {Time Based Coding:210964241}.   Almarie LELON Ferrari, NP 09/18/2024    [1] No Known Allergies [2]  Social History Tobacco Use  Smoking Status Former  Smokeless Tobacco Never  Tobacco Comments   quit 40+ years ago   "

## 2024-09-20 ENCOUNTER — Ambulatory Visit: Admitting: Internal Medicine

## 2024-09-20 ENCOUNTER — Encounter: Payer: Self-pay | Admitting: Internal Medicine

## 2024-09-20 VITALS — BP 116/70 | HR 77 | Ht 73.0 in | Wt 203.0 lb

## 2024-09-20 DIAGNOSIS — R0602 Shortness of breath: Secondary | ICD-10-CM

## 2024-09-20 DIAGNOSIS — R0601 Orthopnea: Secondary | ICD-10-CM | POA: Diagnosis not present

## 2024-09-20 DIAGNOSIS — Z87891 Personal history of nicotine dependence: Secondary | ICD-10-CM

## 2024-09-20 DIAGNOSIS — I509 Heart failure, unspecified: Secondary | ICD-10-CM | POA: Diagnosis not present

## 2024-09-20 DIAGNOSIS — Z8679 Personal history of other diseases of the circulatory system: Secondary | ICD-10-CM

## 2024-09-20 NOTE — Progress Notes (Signed)
 "      OV 06/28/2024  Subjective:  Patient ID: Edward [name], male , DOB: 1951-08-01 , age 74 y.o. , MRN: [id number] , ADDRESS: [address] [place] [address] PCP Norrine Sharper, MD Patient Care Team: Norrine Sharper, MD as PCP - General (Internal Medicine) Arnell Rockney PARAS, [facility] (Inactive) (Pharmacist)  This Provider for this visit: Treatment Team:  Attending Provider: Jude Harden GAILS, MD    06/28/2024 -   Chief Complaint  Patient presents with   Consult   Shortness of Breath    Referred by Dr. Mliss Foot. Pt c/o SOB that started in Sept 2025 that is progressively getting worse. He can not lie flat due to SOB.      HPI Edward [name] 74 y.o. -Edward [name] is a 74 year old male with heart failure who presents with worsening shortness of breath.  At baseline he works in a [employer].  In 2016 he suffered a stroke and got tPA and since then no deficits.  At baseline he also sleeps on his side as a habit.  He never sleeps on his back.  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  He was originally admitted for 5 days ending May 12, 2024 to the teaching service for low sodium that was felt secondary to UTI.  He was given Macrobid .  SIADH was thought the etiology.  There was findings of pancreatitis on his CT scan abdomen.  His sodium at admission on 69/8/25 was 115 [latest 06/22/2024 is 131]    He was admitted for 2 days ending May 18, 2024 for gastric artery aneurysm.  This was after he presented with epigastric pain.  CT at admission showed diverticulosis but no diverticulitis.  EGD showed gastritis.  There is a gastric artery aneurysm found incidentally on CT abdomen.  The CT also showed signs of possible vasculitis but hospitalist team found normal CRP and ESR.  They recommended outpatient angiogram..  Lipase on this admission was normal.  Yeah  He had a third admission for 3 days ending May 30, 2024 for acute respiratory failure with  hypoxemia with a diagnosis of acute systolic heart failure and hyperreactive airways.  According to the patient for this admission even though CHF was considered and he was aggressively diuresed and made him lose weight he does not believe the admission was because of heart failure because he did not feel his heart was acting up.  In fact chart review shows that they did not think he was fluid overloaded.  However his BNP at admission was elevated at 516 and a day later normalized to 64.  But he was hypoxemic and his oxygenation improved with bronchodilator therapy and incentive spirometry.  Otherwise no other echo this admission.  Last echo was January 2025 which showed EF of 45% [baseline EF 35% in 2021 and 2023).  He has been experiencing significant shortness of breath since early September, which worsened towards the end of the month and has persisted throughout October. The shortness of breath is moderate when sitting up and severe when lying on his back, significantly impacting his ability to undergo medical procedures such as an angiogram and ultrasound. The severity has remained constant for about three weeks.  Overall progressive.  He is unable to lie flat at all.  He is dyspnea is described as severe when he lies supine.  It is moderate when he is sitting.  In fact in the office when we  made him lie flat in a matter of 2 or 3 seconds he became quite dyspneic.  I observed paradoxical respiration and he just stood up.  During this time his pulse ox did not drop. WAlking desat test with forehead probe - DID NOT DROP  Then on 06/22/2024 showed up at IR for mesenteric angiogram but because of the orthopnea this was canceled - accoding to his history  Per him: No wheezing, cough, leg swelling, hemoptysis, fever, chills, or recent trauma.SABRA He reports experiencing indigestion and frequent burping but denies any hoarseness or swallowing difficulties.  Also denies any trauma to the chest.  No previous bypass  no MVA.  No chiropractor.  No wheezing.  No edema no hemoptysis no fever no chills no nausea vomiting diarrhea.  No hoarseness of the voice.  Socially, he has been working full-time for the past two weeks after limiting his work hours following his hospital stay. He has a long-standing habit of sleeping on his side or stomach, which he finds comfortable.      CT Chest data from date: *oct 2025  - personally visualized and independently interpreted : yes - my findings are: agree   of the MIP images confirms the above findings.   IMPRESSION: 1. No pulmonary embolus. 2. Improved dependent bibasilar atelectasis/consolidation from prior exam. No acute airspace disease. 3. Cardiomegaly without pulmonary edema. 4. Unchanged ascending aortic aneurysm of 4.1 cm. Annual imaging follow-up with CTA or MRA is recommended.   Aortic Atherosclerosis (ICD10-I70.0).     Electronically Signed   By: Andrea Gasman M.D.   On: 06/03/2024 16:37 PFT      No data to display          eCHO JAn 2025   IMPRESSIONS     1. Left ventricular ejection fraction, by estimation, is 45 to 50%. The  left ventricle has mildly decreased function. The left ventricle  demonstrates global hypokinesis. Left ventricular diastolic parameters are  consistent with Grade I diastolic  dysfunction (impaired relaxation).   2. Right ventricular systolic function is normal. The right ventricular  size is mildly enlarged.   3. The mitral valve is normal in structure. Trivial mitral valve  regurgitation. No evidence of mitral stenosis.   4. The aortic valve is normal in structure. Aortic valve regurgitation is  mild. Aortic valve sclerosis/calcification is present, without any  evidence of aortic stenosis. Aortic regurgitation PHT measures 554 msec.  Aortic valve area, by VTI measures 2.48   cm. Aortic valve mean gradient measures 3.0 mmHg. Aortic valve Vmax  measures 1.25 m/s.   5. The inferior vena cava is  normal in size with greater than 50%  respiratory variability, suggesting right atrial pressure of 3 mmHg.   6. Although the Aortic root dimension is 4.5cm and Ascending aorta  measures 4cm, the Ascending aorta and aortic root measurements are within  normal limits for age when indexed to body surface area and sex.   Comparison(s): EF 35-40%, global hypokinesis worse in the mid to distal  septum.   LAB RESULTS last 96 hours No results found.    Latest Reference Range & Units 05/07/24 13:50 05/28/24 16:10 06/03/24 13:22  B Natriuretic Peptide 0.0 - 100.0 pg/mL 195.0 (H) 63.8 75.9  (H): Data is abnormally high   Latest Reference Range & Units 05/24/19 12:42 09/02/21 11:05 12/22/21 15:35 05/05/24 23:03 05/07/24 13:46 05/07/24 14:07 05/08/24 04:43 05/09/24 02:25 05/10/24 02:57 05/11/24 04:04 05/16/24 07:39 05/16/24 07:45 05/17/24 01:58 05/18/24 03:54 05/27/24 16:10 05/29/24  17:57 05/30/24 03:08 06/03/24 13:22 06/22/24 08:22  Hemoglobin 13.0 - 17.0 g/dL 86.0 86.1 85.6 86.1 85.5 15.0 13.1 12.7 (L) 13.6 12.5 (L) 11.5 (L) 11.2 (L) 9.7 (L) 10.2 (L) 11.7 (L) 11.7 (L) 11.9 (L) 11.2 (L) 12.6 (L)  (L): Data is abnormally low  RHC 08/10/24  ndings:   RA = 7 RV = 31/7 PA = 32/10 (19) PCW = 11 Fick cardiac output/index = 5.9/2.7 Thermo CO/CI = 5.9/2.7 PVR = 1.1 WU Ao sat = 95% PA sat = 67%, 69% High SVC sat = 65% PAPi = 3.1   Assessment: 1. Normal RHC   Plan/Discussion:    RHC is normal. Patient was markedly dyspneic and hyperventilating with normal O2 sats and low PETCO2 when I walked in room. Symptoms much improved with low-dose anxiolytics at start of case.    Toribio Fuel, MD  9:36 AM  OV 09/20/2024  Subjective:  Patient ID: Edward [name], male , DOB: Mar 05, 1951 , age 65 y.o. , MRN: [id number] , ADDRESS: [address] [place] [address] PCP Norrine Sharper, MD Patient Care Team: Norrine Sharper, MD as PCP - General (Internal Medicine) Arnell Rockney PARAS, [facility]  (Inactive) (Pharmacist) [facility] [place] as Consulting Physician [facility])  This Provider for this visit: Treatment Team:  Attending Provider: Geronimo Amel, MD    09/20/2024 -   Chief Complaint  Patient presents with   Medical Management of Chronic Issues   Shortness of Breath    Breathing has improved with exercise. No new respiratory co's. He did had URI over the holidays, but the cough he had from this has resolved.      HPI Edward [name] 74 y.o. - Edward [name] is a 74 year old male who presents with shortness of breath.  He has experienced an improvement in his breathing since the last visit, with less labored breathing during ambulation and the ability to lie on his side more comfortably. He has not attempted to lie flat recently. He attributes this improvement to increased exercise, noting that weeks with more exercise yield better results.  He recalls a recent flu-like illness affecting both him and his wife but denies any new medical problems or hospitalizations since the last visit. He confirms that he has not had a recent CT scan of the chest, although a scan in October showed atelectasis. He has a history of a weak heart, but previous evaluations by a cardiologist indicated that his heart condition is not the cause of his breathing issues. RHC Dec 2025 is normal  He has been working full-time for the past couple of years, which has made it challenging to maintain a regular exercise routine.   Simple office walk 224 (66+46 x 2) feet Pod A at [employer] x  3 laps goal with forehead probe 06/28/2024  06/28/2024   O2 used ra ra  Number laps completed 2 of 3 -he walked with a finger probe 2 of 3 forehead probe  Comments about pace steady   Resting Pulse Ox/HR 93% and 70/min 97% and HR 71  Final Pulse Ox/HR 79% and 96/min (good signat) 95% and HR 99  Desaturated </= 88% yes no  Desaturated <= 3% points yes no  Got Tachycardic >/= 90/min yes yes  Symptoms at  end of test dyspneic dyspneic  Miscellaneous comments Impoved upoin sitting    ECHO 07/05/24  RESSIONS     1. Left ventricular ejection fraction, by estimation, is 45 to 50%. The  left ventricle has mildly decreased function. The left ventricle has no  regional wall motion abnormalities. Left ventricular diastolic parameters  are consistent with Grade I  diastolic dysfunction (impaired relaxation).   2. Right ventricular systolic function is normal. The right ventricular  size is mildly enlarged. There is normal pulmonary artery systolic  pressure.   3. The mitral valve is normal in structure. No evidence of mitral valve  regurgitation.   4. The aortic valve is tricuspid. Aortic valve regurgitation is trivial.  No aortic stenosis is present.   5. Aortic dilatation noted. There is mild dilatation of the aortic root,  measuring 44 mm.   Comparison(s): No significant change from prior study.    PFT      No data to display          SNIFF TEST NOv 2025   IMPRESSION: Normal sniff test.   Performed by: Kimble Clas, PA-C     Electronically Signed   By: Rockey Kilts M.D.   On: 07/02/2024 16:54   LAB RESULTS last 96 hours No results found.       has a past medical history of Abnormal EKG (05/29/2018), Benign essential HTN (05/29/2018), Cardiomyopathy (HCC), Dilation of aorta, Environmental allergies, Fracture, ribs, H/O: varicose veins, Hearing loss, Hyperlipidemia LDL goal <70, Multiple food allergies, Rash, Seasonal allergies, Sinusitis, and Stroke (HCC).   reports that he has quit smoking. He has never used smokeless tobacco.  Past Surgical History:  Procedure Laterality Date   BIOPSY OF SKIN SUBCUTANEOUS TISSUE AND/OR MUCOUS MEMBRANE  05/18/2024   Procedure: BIOPSY, SKIN, SUBCUTANEOUS TISSUE, OR MUCOUS MEMBRANE;  Surgeon: Stacia Glendia BRAVO, MD;  Location: [facility] ENDOSCOPY;  Service: Gastroenterology;;   COLONOSCOPY     ESOPHAGOGASTRODUODENOSCOPY N/A 05/18/2024    Procedure: EGD (ESOPHAGOGASTRODUODENOSCOPY);  Surgeon: Stacia Glendia BRAVO, MD;  Location: [facility] ENDOSCOPY;  Service: Gastroenterology;  Laterality: N/A;   INGUINAL HERNIA REPAIR Left    IR FLUORO RM 30-60 MIN  06/22/2024   IR RADIOLOGIST EVAL & MGMT  05/23/2024   right hand surgery     RIGHT HEART CATH N/A 08/14/2024   Procedure: RIGHT HEART CATH;  Surgeon: Cherrie Toribio SAUNDERS, MD;  Location: [facility] INVASIVE CV LAB;  Service: Cardiovascular;  Laterality: N/A;    Allergies[1]  Immunization History  Administered Date(s) Administered   Fluad Quad(high Dose 65+) 06/11/2020   Janssen (J&J) SARS-COV-2 Vaccination 12/29/2019   Pneumococcal Conjugate-13 05/24/2019   Pneumococcal-Unspecified 06/29/2020    Family History  Problem Relation Age of Onset   CVA Mother    Heart failure Father        CHF   Brain cancer Brother        GBM   Colon polyps Neg Hx    Colon cancer Neg Hx    Esophageal cancer Neg Hx    Rectal cancer Neg Hx    Stomach cancer Neg Hx     Current Medications[2]      Objective:   Vitals:   09/20/24 1336  BP: 116/70  Pulse: 77  SpO2: 97%  Weight: 203 lb (92.1 kg)  Height: 6' 1 (1.854 m)    Estimated body mass index is 26.78 kg/m as calculated from the following:   Height as of this encounter: 6' 1 (1.854 m).   Weight as of this encounter: 203 lb (92.1 kg).  @WEIGHTCHANGE @  [employer]   09/20/24 1336  Weight: 203 lb (92.1 kg)     Physical Exam   General: No distress. Looks stable  but does sigh O2 at rest: no Cane present: no Sitting in wheel chair: no Frail: no Obese: no Neuro: Alert and Oriented x 3. GCS 15. Speech normal Psych: Pleasant Resp:  Barrel Chest - no.  Wheeze - no, Crackles - no, No overt respiratory distress CVS: Normal heart sounds. Murmurs - no Ext: Stigmata of Connective Tissue Disease - no HEENT: Normal upper airway. PEERL +. No post nasal drip        Assessment/     Assessment & Plan SOB (shortness of  breath)  Orthopnea  History of chronic CHF    PLAN Patient Instructions     ICD-10-CM   1. SOB (shortness of breath)  R06.02 AMB referral to pulmonary rehabilitation    2. Orthopnea  R06.01 AMB referral to pulmonary rehabilitation    3. History of chronic CHF  Z86.79 AMB referral to pulmonary rehabilitation       Glad beter with home exercise. Heart is well compensated and BNP is normal. SNiff test normal. Righ heart cath is normal  Plan   -- cintinue symbicort  for now  - refer home rehab   Followu 6 months with APP; at tha visit basd on progress consider CPST v HRCT supine/prone v supportive care    FOLLOWUP    Return in about 6 months (around 03/20/2025) for with any of the APPS.    SIGNATURE    Dr. Dorethia Cave, M.D., F.C.C.P,  Pulmonary and Critical Care Medicine Staff Physician, [facility] Center Director - Interstitial Lung Disease  Program  Pulmonary Fibrosis [facility] [place], [place], [address]  Pager: [phone], If no answer or between  15:00h - 7:00h: call [phone] Telephone: [phone]  2:31 PM 09/20/2024     [1] No Known Allergies [2]  Current Outpatient Medications:    acetaminophen  (TYLENOL ) 650 MG CR tablet, Take 1,300 mg by mouth every 8 (eight) hours as needed for pain., Disp: , Rfl:    albuterol  (VENTOLIN  HFA) 108 (90 Base) MCG/ACT inhaler, Inhale 2 puffs into the lungs every 4 (four) hours as needed for wheezing or shortness of breath., Disp: , Rfl:    aspirin  EC 81 MG tablet, Take 81 mg by mouth at bedtime. Swallow whole., Disp: , Rfl:    cetirizine  (ZYRTEC  ALLERGY) 10 MG tablet, Take 1 tablet (10 mg total) by mouth at bedtime. (Patient taking differently: Take 10 mg by mouth daily as needed for allergies.), Disp: 15 tablet, Rfl: 0   Coenzyme Q10 (COQ-10 PO), Take 1 capsule by mouth in the morning., Disp: , Rfl:    finasteride  (PROSCAR ) 5 MG tablet, Take 1 tablet (5 mg  total) by mouth daily., Disp: 90 tablet, Rfl: 3   fluticasone  (FLONASE ) 50 MCG/ACT nasal spray, Place 1 spray into both nostrils daily. (Patient taking differently: Place 1 spray into both nostrils daily as needed for allergies.), Disp: 16 g, Rfl: 0   losartan  (COZAAR ) 25 MG tablet, Take 1 tablet (25 mg total) by mouth at bedtime., Disp: 90 tablet, Rfl: 3   metoprolol  succinate (TOPROL -XL) 25 MG 24 hr tablet, Take 1 tablet (25 mg total) by mouth at bedtime. Take 1 tablet (25 mg) by mouth daily. Appointment needed for continuation of refills-1st attempt, Disp: 90 tablet, Rfl: 3   Multiple Vitamin (MULTIVITAMIN WITH MINERALS) TABS tablet, Take 1 tablet by mouth in the morning., Disp: , Rfl:    N-ACETYL CYSTEINE 600 MG TABS, Take 1  tablet by mouth at bedtime., Disp: , Rfl:    omeprazole  (PRILOSEC) 20 MG capsule, Take 20 mg by mouth in the morning., Disp: , Rfl:    OVER THE COUNTER MEDICATION, Take 1 capsule by mouth at bedtime. PQQ: Pyrroloquinoline Quinone, Disp: , Rfl:    RA CRANBERRY 500 MG CAPS, Take 1 capsule by mouth in the morning and at bedtime., Disp: , Rfl:    rosuvastatin  (CRESTOR ) 10 MG tablet, Take 1 tablet (10 mg total) by mouth at bedtime., Disp: 90 tablet, Rfl: 3   spironolactone  (ALDACTONE ) 25 MG tablet, Take 1 tablet (25 mg total) by mouth daily., Disp: 90 tablet, Rfl: 3   tamsulosin  (FLOMAX ) 0.4 MG CAPS capsule, Take 1 capsule (0.4 mg total) by mouth at bedtime., Disp: 90 capsule, Rfl: 3  "

## 2024-09-20 NOTE — Patient Instructions (Addendum)
"    ICD-10-CM   1. SOB (shortness of breath)  R06.02 AMB referral to pulmonary rehabilitation    2. Orthopnea  R06.01 AMB referral to pulmonary rehabilitation    3. History of chronic CHF  Z86.79 AMB referral to pulmonary rehabilitation       Glad beter with home exercise. Heart is well compensated and BNP is normal. SNiff test normal. Righ heart cath is normal  Plan   -- cintinue symbicort  for now  - refer home rehab   Followu 6 months with APP; at tha visit basd on progress consider CPST v HRCT supine/prone v supportive care "

## 2024-09-25 ENCOUNTER — Telehealth (HOSPITAL_COMMUNITY): Payer: Self-pay

## 2024-09-25 NOTE — Telephone Encounter (Signed)
 Pt insurance is active and benefits verified through [facility] Co-pay $40, DED 0/0 met, out of pocket $3,950/$10 met, co-insurance 0%. no pre-authorization required, Tina/[employer] 09/25/2024@3 :49, REF# [id number]  No visit limit for pulmonary rehab

## 2024-09-28 ENCOUNTER — Telehealth (HOSPITAL_COMMUNITY): Payer: Self-pay

## 2024-09-28 NOTE — Telephone Encounter (Signed)
 Called pt to see if he was interested in participating in the Pulmonary rehab program. He stated he was, but needed to talk with his wife first since his insurance has changed. He will call us  back after he talks with his wife.

## 2024-10-03 ENCOUNTER — Encounter (HOSPITAL_COMMUNITY): Payer: Self-pay
